# Patient Record
Sex: Female | Born: 1940 | Race: White | Hispanic: No | Smoking: Former smoker
Health system: Southern US, Community
[De-identification: ages and names within clinical notes are randomized; demographics above are authoritative.]

## PROBLEM LIST (undated history)

## (undated) DIAGNOSIS — K219 Gastro-esophageal reflux disease without esophagitis: Secondary | ICD-10-CM

## (undated) DIAGNOSIS — Z860100 Personal history of colon polyps, unspecified: Secondary | ICD-10-CM

## (undated) DIAGNOSIS — C50919 Malignant neoplasm of unspecified site of unspecified female breast: Secondary | ICD-10-CM

## (undated) DIAGNOSIS — Z8719 Personal history of other diseases of the digestive system: Secondary | ICD-10-CM

## (undated) DIAGNOSIS — IMO0002 Reserved for concepts with insufficient information to code with codable children: Secondary | ICD-10-CM

## (undated) DIAGNOSIS — Z8709 Personal history of other diseases of the respiratory system: Secondary | ICD-10-CM

## (undated) DIAGNOSIS — N993 Prolapse of vaginal vault after hysterectomy: Secondary | ICD-10-CM

## (undated) DIAGNOSIS — R350 Frequency of micturition: Secondary | ICD-10-CM

## (undated) DIAGNOSIS — I2489 Other forms of acute ischemic heart disease: Secondary | ICD-10-CM

## (undated) DIAGNOSIS — R102 Pelvic and perineal pain: Secondary | ICD-10-CM

## (undated) DIAGNOSIS — M1990 Unspecified osteoarthritis, unspecified site: Secondary | ICD-10-CM

## (undated) DIAGNOSIS — I10 Essential (primary) hypertension: Secondary | ICD-10-CM

## (undated) DIAGNOSIS — Z9109 Other allergy status, other than to drugs and biological substances: Secondary | ICD-10-CM

## (undated) DIAGNOSIS — R351 Nocturia: Secondary | ICD-10-CM

## (undated) DIAGNOSIS — R319 Hematuria, unspecified: Secondary | ICD-10-CM

## (undated) DIAGNOSIS — I248 Other forms of acute ischemic heart disease: Secondary | ICD-10-CM

## (undated) DIAGNOSIS — R238 Other skin changes: Secondary | ICD-10-CM

## (undated) DIAGNOSIS — G8929 Other chronic pain: Secondary | ICD-10-CM

## (undated) DIAGNOSIS — I499 Cardiac arrhythmia, unspecified: Secondary | ICD-10-CM

## (undated) DIAGNOSIS — Z4689 Encounter for fitting and adjustment of other specified devices: Principal | ICD-10-CM

## (undated) DIAGNOSIS — I2510 Atherosclerotic heart disease of native coronary artery without angina pectoris: Secondary | ICD-10-CM

## (undated) DIAGNOSIS — Z8669 Personal history of other diseases of the nervous system and sense organs: Secondary | ICD-10-CM

## (undated) DIAGNOSIS — K5730 Diverticulosis of large intestine without perforation or abscess without bleeding: Secondary | ICD-10-CM

## (undated) DIAGNOSIS — D649 Anemia, unspecified: Secondary | ICD-10-CM

## (undated) DIAGNOSIS — N159 Renal tubulo-interstitial disease, unspecified: Secondary | ICD-10-CM

## (undated) DIAGNOSIS — R3915 Urgency of urination: Secondary | ICD-10-CM

## (undated) DIAGNOSIS — K6289 Other specified diseases of anus and rectum: Secondary | ICD-10-CM

## (undated) DIAGNOSIS — G4700 Insomnia, unspecified: Secondary | ICD-10-CM

## (undated) DIAGNOSIS — R0602 Shortness of breath: Secondary | ICD-10-CM

## (undated) DIAGNOSIS — M549 Dorsalgia, unspecified: Secondary | ICD-10-CM

## (undated) DIAGNOSIS — J302 Other seasonal allergic rhinitis: Secondary | ICD-10-CM

## (undated) DIAGNOSIS — F410 Panic disorder [episodic paroxysmal anxiety] without agoraphobia: Secondary | ICD-10-CM

## (undated) HISTORY — DX: Encounter for fitting and adjustment of other specified devices: Z46.89

## (undated) HISTORY — DX: Other allergy status, other than to drugs and biological substances: Z91.09

## (undated) HISTORY — DX: Essential (primary) hypertension: I10

## (undated) HISTORY — DX: Hematuria, unspecified: R31.9

## (undated) HISTORY — PX: BACK SURGERY: SHX140

## (undated) HISTORY — DX: Gastro-esophageal reflux disease without esophagitis: K21.9

## (undated) HISTORY — PX: BLADDER SUSPENSION: SHX72

## (undated) HISTORY — DX: Other chronic pain: G89.29

## (undated) HISTORY — DX: Diverticulosis of large intestine without perforation or abscess without bleeding: K57.30

## (undated) HISTORY — PX: ABDOMINAL HYSTERECTOMY: SHX81

## (undated) HISTORY — PX: CHOLECYSTECTOMY: SHX55

## (undated) HISTORY — DX: Other forms of acute ischemic heart disease: I24.89

## (undated) HISTORY — PX: EYE SURGERY: SHX253

## (undated) HISTORY — DX: Atherosclerotic heart disease of native coronary artery without angina pectoris: I25.10

## (undated) HISTORY — PX: MOUTH SURGERY: SHX715

## (undated) HISTORY — DX: Other forms of acute ischemic heart disease: I24.8

## (undated) HISTORY — DX: Reserved for concepts with insufficient information to code with codable children: IMO0002

## (undated) HISTORY — DX: Personal history of other diseases of the digestive system: Z87.19

## (undated) HISTORY — PX: MASTECTOMY: SHX3

## (undated) HISTORY — DX: Malignant neoplasm of unspecified site of unspecified female breast: C50.919

## (undated) HISTORY — PX: BREAST SURGERY: SHX581

## (undated) HISTORY — DX: Pelvic and perineal pain: R10.2

## (undated) HISTORY — DX: Other specified diseases of anus and rectum: K62.89

## (undated) HISTORY — DX: Prolapse of vaginal vault after hysterectomy: N99.3

---

## 1999-05-28 ENCOUNTER — Ambulatory Visit (HOSPITAL_COMMUNITY): Admission: RE | Admit: 1999-05-28 | Discharge: 1999-05-28 | Payer: Self-pay | Admitting: Gastroenterology

## 1999-05-28 ENCOUNTER — Encounter (INDEPENDENT_AMBULATORY_CARE_PROVIDER_SITE_OTHER): Payer: Self-pay

## 2000-09-11 ENCOUNTER — Encounter: Payer: Self-pay | Admitting: Family Medicine

## 2000-09-11 ENCOUNTER — Ambulatory Visit (HOSPITAL_COMMUNITY): Admission: RE | Admit: 2000-09-11 | Discharge: 2000-09-11 | Payer: Self-pay | Admitting: Family Medicine

## 2000-09-30 ENCOUNTER — Encounter: Payer: Self-pay | Admitting: Cardiology

## 2000-09-30 ENCOUNTER — Ambulatory Visit (HOSPITAL_COMMUNITY): Admission: RE | Admit: 2000-09-30 | Discharge: 2000-09-30 | Payer: Self-pay | Admitting: Cardiology

## 2000-10-31 ENCOUNTER — Emergency Department (HOSPITAL_COMMUNITY): Admission: EM | Admit: 2000-10-31 | Discharge: 2000-10-31 | Payer: Self-pay | Admitting: *Deleted

## 2000-10-31 ENCOUNTER — Encounter: Payer: Self-pay | Admitting: *Deleted

## 2000-11-06 ENCOUNTER — Encounter: Payer: Self-pay | Admitting: Obstetrics and Gynecology

## 2000-11-06 ENCOUNTER — Ambulatory Visit (HOSPITAL_COMMUNITY): Admission: RE | Admit: 2000-11-06 | Discharge: 2000-11-06 | Payer: Self-pay | Admitting: Obstetrics and Gynecology

## 2000-11-19 ENCOUNTER — Ambulatory Visit (HOSPITAL_COMMUNITY): Admission: RE | Admit: 2000-11-19 | Discharge: 2000-11-19 | Payer: Self-pay | Admitting: Family Medicine

## 2000-11-19 ENCOUNTER — Encounter: Payer: Self-pay | Admitting: Family Medicine

## 2001-06-03 ENCOUNTER — Inpatient Hospital Stay (HOSPITAL_COMMUNITY): Admission: RE | Admit: 2001-06-03 | Discharge: 2001-06-05 | Payer: Self-pay | Admitting: Obstetrics and Gynecology

## 2001-06-08 ENCOUNTER — Emergency Department (HOSPITAL_COMMUNITY): Admission: EM | Admit: 2001-06-08 | Discharge: 2001-06-08 | Payer: Self-pay | Admitting: *Deleted

## 2002-04-28 HISTORY — PX: BREAST RECONSTRUCTION: SHX9

## 2002-06-21 ENCOUNTER — Encounter: Payer: Self-pay | Admitting: Obstetrics and Gynecology

## 2002-06-21 ENCOUNTER — Ambulatory Visit (HOSPITAL_COMMUNITY): Admission: RE | Admit: 2002-06-21 | Discharge: 2002-06-21 | Payer: Self-pay | Admitting: Obstetrics and Gynecology

## 2002-06-27 ENCOUNTER — Ambulatory Visit (HOSPITAL_COMMUNITY): Admission: RE | Admit: 2002-06-27 | Discharge: 2002-06-27 | Payer: Self-pay | Admitting: Obstetrics and Gynecology

## 2002-06-27 ENCOUNTER — Encounter: Payer: Self-pay | Admitting: Obstetrics and Gynecology

## 2002-07-06 ENCOUNTER — Encounter: Payer: Self-pay | Admitting: General Surgery

## 2002-07-06 ENCOUNTER — Ambulatory Visit (HOSPITAL_COMMUNITY): Admission: RE | Admit: 2002-07-06 | Discharge: 2002-07-06 | Payer: Self-pay | Admitting: General Surgery

## 2002-07-18 ENCOUNTER — Encounter (HOSPITAL_COMMUNITY): Admission: RE | Admit: 2002-07-18 | Discharge: 2002-08-17 | Payer: Self-pay | Admitting: Oncology

## 2002-07-18 ENCOUNTER — Encounter (HOSPITAL_COMMUNITY): Payer: Self-pay | Admitting: Oncology

## 2002-07-18 ENCOUNTER — Encounter: Admission: RE | Admit: 2002-07-18 | Discharge: 2002-07-18 | Payer: Self-pay | Admitting: Oncology

## 2002-07-19 ENCOUNTER — Inpatient Hospital Stay (HOSPITAL_COMMUNITY): Admission: RE | Admit: 2002-07-19 | Discharge: 2002-07-21 | Payer: Self-pay | Admitting: General Surgery

## 2002-07-25 ENCOUNTER — Encounter (HOSPITAL_COMMUNITY): Payer: Self-pay | Admitting: Oncology

## 2002-08-07 ENCOUNTER — Emergency Department (HOSPITAL_COMMUNITY): Admission: EM | Admit: 2002-08-07 | Discharge: 2002-08-07 | Payer: Self-pay | Admitting: Emergency Medicine

## 2002-08-07 ENCOUNTER — Encounter: Payer: Self-pay | Admitting: Emergency Medicine

## 2002-08-30 ENCOUNTER — Encounter (HOSPITAL_COMMUNITY): Payer: Self-pay | Admitting: Oncology

## 2002-08-30 ENCOUNTER — Encounter (HOSPITAL_COMMUNITY): Admission: RE | Admit: 2002-08-30 | Discharge: 2002-09-29 | Payer: Self-pay | Admitting: Oncology

## 2002-08-30 ENCOUNTER — Encounter: Admission: RE | Admit: 2002-08-30 | Discharge: 2002-08-30 | Payer: Self-pay | Admitting: Oncology

## 2002-10-17 ENCOUNTER — Encounter: Payer: Self-pay | Admitting: Family Medicine

## 2002-10-17 ENCOUNTER — Ambulatory Visit (HOSPITAL_COMMUNITY): Admission: RE | Admit: 2002-10-17 | Discharge: 2002-10-17 | Payer: Self-pay | Admitting: Family Medicine

## 2002-10-28 ENCOUNTER — Encounter (HOSPITAL_COMMUNITY): Admission: RE | Admit: 2002-10-28 | Discharge: 2002-11-27 | Payer: Self-pay | Admitting: Oncology

## 2002-10-28 ENCOUNTER — Encounter: Admission: RE | Admit: 2002-10-28 | Discharge: 2002-10-28 | Payer: Self-pay | Admitting: Oncology

## 2002-12-05 ENCOUNTER — Encounter: Admission: RE | Admit: 2002-12-05 | Discharge: 2002-12-05 | Payer: Self-pay | Admitting: Oncology

## 2002-12-05 ENCOUNTER — Encounter (HOSPITAL_COMMUNITY): Payer: Self-pay | Admitting: Oncology

## 2002-12-05 ENCOUNTER — Encounter (HOSPITAL_COMMUNITY): Admission: RE | Admit: 2002-12-05 | Discharge: 2003-01-04 | Payer: Self-pay | Admitting: Oncology

## 2003-01-04 ENCOUNTER — Inpatient Hospital Stay (HOSPITAL_COMMUNITY): Admission: RE | Admit: 2003-01-04 | Discharge: 2003-01-06 | Payer: Self-pay | Admitting: Plastic Surgery

## 2003-03-08 ENCOUNTER — Encounter: Admission: RE | Admit: 2003-03-08 | Discharge: 2003-03-08 | Payer: Self-pay | Admitting: Oncology

## 2003-03-08 ENCOUNTER — Encounter (HOSPITAL_COMMUNITY): Admission: RE | Admit: 2003-03-08 | Discharge: 2003-04-07 | Payer: Self-pay | Admitting: Oncology

## 2003-04-04 ENCOUNTER — Ambulatory Visit (HOSPITAL_BASED_OUTPATIENT_CLINIC_OR_DEPARTMENT_OTHER): Admission: RE | Admit: 2003-04-04 | Discharge: 2003-04-04 | Payer: Self-pay | Admitting: Plastic Surgery

## 2003-07-07 ENCOUNTER — Encounter (HOSPITAL_COMMUNITY): Admission: RE | Admit: 2003-07-07 | Discharge: 2003-08-06 | Payer: Self-pay | Admitting: Oncology

## 2003-07-07 ENCOUNTER — Encounter: Admission: RE | Admit: 2003-07-07 | Discharge: 2003-07-07 | Payer: Self-pay | Admitting: Oncology

## 2003-07-07 ENCOUNTER — Ambulatory Visit (HOSPITAL_COMMUNITY): Admission: RE | Admit: 2003-07-07 | Discharge: 2003-07-07 | Payer: Self-pay | Admitting: Family Medicine

## 2003-09-08 ENCOUNTER — Encounter: Admission: RE | Admit: 2003-09-08 | Discharge: 2003-09-08 | Payer: Self-pay | Admitting: Oncology

## 2003-09-08 ENCOUNTER — Encounter (HOSPITAL_COMMUNITY): Admission: RE | Admit: 2003-09-08 | Discharge: 2003-10-08 | Payer: Self-pay | Admitting: Oncology

## 2004-01-18 ENCOUNTER — Ambulatory Visit (HOSPITAL_COMMUNITY): Admission: RE | Admit: 2004-01-18 | Discharge: 2004-01-18 | Payer: Self-pay | Admitting: Internal Medicine

## 2004-01-18 HISTORY — PX: COLONOSCOPY: SHX174

## 2004-03-04 ENCOUNTER — Ambulatory Visit (HOSPITAL_COMMUNITY): Admission: RE | Admit: 2004-03-04 | Discharge: 2004-03-04 | Payer: Self-pay | Admitting: Plastic Surgery

## 2004-03-11 ENCOUNTER — Encounter (HOSPITAL_COMMUNITY): Admission: RE | Admit: 2004-03-11 | Discharge: 2004-04-10 | Payer: Self-pay | Admitting: Oncology

## 2004-03-11 ENCOUNTER — Ambulatory Visit (HOSPITAL_COMMUNITY): Payer: Self-pay | Admitting: Oncology

## 2004-03-11 ENCOUNTER — Encounter: Admission: RE | Admit: 2004-03-11 | Discharge: 2004-03-11 | Payer: Self-pay | Admitting: Oncology

## 2004-04-09 ENCOUNTER — Ambulatory Visit: Payer: Self-pay | Admitting: Pulmonary Disease

## 2004-06-07 ENCOUNTER — Ambulatory Visit: Payer: Self-pay | Admitting: *Deleted

## 2004-06-14 ENCOUNTER — Ambulatory Visit (HOSPITAL_COMMUNITY): Admission: RE | Admit: 2004-06-14 | Discharge: 2004-06-14 | Payer: Self-pay | Admitting: *Deleted

## 2004-06-14 ENCOUNTER — Ambulatory Visit: Payer: Self-pay | Admitting: *Deleted

## 2004-06-21 ENCOUNTER — Ambulatory Visit: Payer: Self-pay | Admitting: *Deleted

## 2004-06-28 ENCOUNTER — Ambulatory Visit: Payer: Self-pay | Admitting: Pulmonary Disease

## 2004-08-12 ENCOUNTER — Ambulatory Visit (HOSPITAL_COMMUNITY): Admission: RE | Admit: 2004-08-12 | Discharge: 2004-08-12 | Payer: Self-pay | Admitting: Obstetrics and Gynecology

## 2004-09-09 ENCOUNTER — Encounter (HOSPITAL_COMMUNITY): Admission: RE | Admit: 2004-09-09 | Discharge: 2004-10-09 | Payer: Self-pay | Admitting: Oncology

## 2004-09-09 ENCOUNTER — Encounter: Admission: RE | Admit: 2004-09-09 | Discharge: 2004-09-09 | Payer: Self-pay | Admitting: Oncology

## 2004-09-09 ENCOUNTER — Ambulatory Visit (HOSPITAL_COMMUNITY): Payer: Self-pay | Admitting: Oncology

## 2005-03-17 ENCOUNTER — Encounter: Admission: RE | Admit: 2005-03-17 | Discharge: 2005-03-17 | Payer: Self-pay | Admitting: Oncology

## 2005-03-17 ENCOUNTER — Encounter (HOSPITAL_COMMUNITY): Admission: RE | Admit: 2005-03-17 | Discharge: 2005-04-16 | Payer: Self-pay | Admitting: Oncology

## 2005-03-17 ENCOUNTER — Ambulatory Visit (HOSPITAL_COMMUNITY): Payer: Self-pay | Admitting: Oncology

## 2005-03-31 ENCOUNTER — Ambulatory Visit: Payer: Self-pay | Admitting: Internal Medicine

## 2005-04-29 ENCOUNTER — Ambulatory Visit: Payer: Self-pay | Admitting: Internal Medicine

## 2005-04-30 ENCOUNTER — Ambulatory Visit (HOSPITAL_COMMUNITY): Admission: RE | Admit: 2005-04-30 | Discharge: 2005-04-30 | Payer: Self-pay | Admitting: Internal Medicine

## 2005-05-12 ENCOUNTER — Ambulatory Visit: Payer: Self-pay | Admitting: Internal Medicine

## 2005-05-19 ENCOUNTER — Ambulatory Visit (HOSPITAL_COMMUNITY): Admission: RE | Admit: 2005-05-19 | Discharge: 2005-05-19 | Payer: Self-pay | Admitting: Internal Medicine

## 2005-06-23 ENCOUNTER — Ambulatory Visit: Payer: Self-pay | Admitting: Internal Medicine

## 2005-08-26 ENCOUNTER — Ambulatory Visit (HOSPITAL_COMMUNITY): Admission: RE | Admit: 2005-08-26 | Discharge: 2005-08-26 | Payer: Self-pay | Admitting: Obstetrics and Gynecology

## 2005-09-15 ENCOUNTER — Encounter: Admission: RE | Admit: 2005-09-15 | Discharge: 2005-09-15 | Payer: Self-pay | Admitting: Oncology

## 2005-09-15 ENCOUNTER — Ambulatory Visit (HOSPITAL_COMMUNITY): Payer: Self-pay | Admitting: Oncology

## 2005-09-15 ENCOUNTER — Ambulatory Visit (HOSPITAL_COMMUNITY): Admission: RE | Admit: 2005-09-15 | Discharge: 2005-09-15 | Payer: Self-pay | Admitting: Family Medicine

## 2005-09-15 ENCOUNTER — Encounter (HOSPITAL_COMMUNITY): Admission: RE | Admit: 2005-09-15 | Discharge: 2005-10-15 | Payer: Self-pay | Admitting: Oncology

## 2005-12-01 ENCOUNTER — Ambulatory Visit: Payer: Self-pay | Admitting: Internal Medicine

## 2005-12-04 ENCOUNTER — Ambulatory Visit (HOSPITAL_COMMUNITY): Admission: RE | Admit: 2005-12-04 | Discharge: 2005-12-04 | Payer: Self-pay | Admitting: Internal Medicine

## 2006-05-05 ENCOUNTER — Ambulatory Visit (HOSPITAL_COMMUNITY): Payer: Self-pay | Admitting: Oncology

## 2006-05-05 ENCOUNTER — Encounter (HOSPITAL_COMMUNITY): Admission: RE | Admit: 2006-05-05 | Discharge: 2006-06-04 | Payer: Self-pay | Admitting: Oncology

## 2006-05-20 ENCOUNTER — Ambulatory Visit: Payer: Self-pay | Admitting: Cardiology

## 2006-05-22 ENCOUNTER — Ambulatory Visit: Payer: Self-pay | Admitting: Cardiology

## 2006-05-26 ENCOUNTER — Inpatient Hospital Stay (HOSPITAL_BASED_OUTPATIENT_CLINIC_OR_DEPARTMENT_OTHER): Admission: RE | Admit: 2006-05-26 | Discharge: 2006-05-26 | Payer: Self-pay | Admitting: Cardiology

## 2006-05-26 ENCOUNTER — Ambulatory Visit: Payer: Self-pay | Admitting: Cardiovascular Disease

## 2006-06-11 ENCOUNTER — Ambulatory Visit: Payer: Self-pay | Admitting: Cardiology

## 2006-06-23 ENCOUNTER — Ambulatory Visit: Payer: Self-pay | Admitting: Cardiology

## 2006-07-21 ENCOUNTER — Ambulatory Visit: Payer: Self-pay | Admitting: Cardiology

## 2006-08-03 ENCOUNTER — Ambulatory Visit: Payer: Self-pay | Admitting: Internal Medicine

## 2006-08-03 ENCOUNTER — Ambulatory Visit (HOSPITAL_COMMUNITY): Admission: RE | Admit: 2006-08-03 | Discharge: 2006-08-03 | Payer: Self-pay | Admitting: Cardiology

## 2006-08-12 ENCOUNTER — Ambulatory Visit: Payer: Self-pay | Admitting: Physician Assistant

## 2006-08-26 ENCOUNTER — Ambulatory Visit: Payer: Self-pay | Admitting: Cardiology

## 2006-09-07 ENCOUNTER — Ambulatory Visit (HOSPITAL_COMMUNITY): Admission: RE | Admit: 2006-09-07 | Discharge: 2006-09-07 | Payer: Self-pay | Admitting: Obstetrics and Gynecology

## 2006-11-09 ENCOUNTER — Encounter (HOSPITAL_COMMUNITY): Admission: RE | Admit: 2006-11-09 | Discharge: 2006-12-09 | Payer: Self-pay | Admitting: Oncology

## 2006-11-09 ENCOUNTER — Ambulatory Visit (HOSPITAL_COMMUNITY): Payer: Self-pay | Admitting: Oncology

## 2007-05-19 ENCOUNTER — Ambulatory Visit (HOSPITAL_COMMUNITY): Payer: Self-pay | Admitting: Oncology

## 2007-05-19 ENCOUNTER — Encounter (HOSPITAL_COMMUNITY): Admission: RE | Admit: 2007-05-19 | Discharge: 2007-06-18 | Payer: Self-pay | Admitting: Oncology

## 2007-06-01 ENCOUNTER — Ambulatory Visit: Payer: Self-pay | Admitting: Internal Medicine

## 2007-06-14 ENCOUNTER — Encounter: Payer: Self-pay | Admitting: Internal Medicine

## 2007-06-14 ENCOUNTER — Ambulatory Visit (HOSPITAL_COMMUNITY): Admission: RE | Admit: 2007-06-14 | Discharge: 2007-06-14 | Payer: Self-pay | Admitting: Internal Medicine

## 2007-06-14 ENCOUNTER — Ambulatory Visit: Payer: Self-pay | Admitting: Internal Medicine

## 2007-06-14 HISTORY — PX: COLONOSCOPY: SHX174

## 2007-06-14 HISTORY — PX: ESOPHAGOGASTRODUODENOSCOPY: SHX1529

## 2007-07-14 ENCOUNTER — Encounter (HOSPITAL_COMMUNITY): Admission: RE | Admit: 2007-07-14 | Discharge: 2007-08-13 | Payer: Self-pay | Admitting: Oncology

## 2007-07-14 ENCOUNTER — Ambulatory Visit (HOSPITAL_COMMUNITY): Payer: Self-pay | Admitting: Oncology

## 2007-08-01 ENCOUNTER — Encounter: Admission: RE | Admit: 2007-08-01 | Discharge: 2007-08-01 | Payer: Self-pay | Admitting: Specialist

## 2007-09-15 ENCOUNTER — Ambulatory Visit (HOSPITAL_COMMUNITY): Admission: RE | Admit: 2007-09-15 | Discharge: 2007-09-15 | Payer: Self-pay | Admitting: Obstetrics and Gynecology

## 2008-05-26 ENCOUNTER — Ambulatory Visit (HOSPITAL_COMMUNITY): Admission: RE | Admit: 2008-05-26 | Discharge: 2008-05-26 | Payer: Self-pay | Admitting: Family Medicine

## 2008-07-03 ENCOUNTER — Ambulatory Visit (HOSPITAL_COMMUNITY): Payer: Self-pay | Admitting: Oncology

## 2008-09-09 ENCOUNTER — Emergency Department (HOSPITAL_COMMUNITY): Admission: EM | Admit: 2008-09-09 | Discharge: 2008-09-09 | Payer: Self-pay | Admitting: Emergency Medicine

## 2008-09-15 ENCOUNTER — Encounter (HOSPITAL_COMMUNITY): Admission: RE | Admit: 2008-09-15 | Discharge: 2008-10-15 | Payer: Self-pay | Admitting: Oncology

## 2008-12-09 ENCOUNTER — Encounter: Admission: RE | Admit: 2008-12-09 | Discharge: 2008-12-09 | Payer: Self-pay | Admitting: Specialist

## 2009-06-28 ENCOUNTER — Inpatient Hospital Stay (HOSPITAL_COMMUNITY): Admission: RE | Admit: 2009-06-28 | Discharge: 2009-06-30 | Payer: Self-pay | Admitting: Specialist

## 2009-10-01 ENCOUNTER — Ambulatory Visit (HOSPITAL_COMMUNITY): Admission: RE | Admit: 2009-10-01 | Discharge: 2009-10-01 | Payer: Self-pay | Admitting: Oncology

## 2009-10-23 ENCOUNTER — Ambulatory Visit (HOSPITAL_COMMUNITY): Payer: Self-pay | Admitting: Oncology

## 2009-12-14 DIAGNOSIS — J45909 Unspecified asthma, uncomplicated: Secondary | ICD-10-CM | POA: Insufficient documentation

## 2009-12-14 DIAGNOSIS — I2510 Atherosclerotic heart disease of native coronary artery without angina pectoris: Secondary | ICD-10-CM | POA: Insufficient documentation

## 2009-12-14 DIAGNOSIS — K219 Gastro-esophageal reflux disease without esophagitis: Secondary | ICD-10-CM | POA: Insufficient documentation

## 2009-12-14 DIAGNOSIS — Z853 Personal history of malignant neoplasm of breast: Secondary | ICD-10-CM | POA: Insufficient documentation

## 2009-12-18 ENCOUNTER — Encounter: Payer: Self-pay | Admitting: Gastroenterology

## 2009-12-18 ENCOUNTER — Ambulatory Visit: Payer: Self-pay | Admitting: Internal Medicine

## 2009-12-18 DIAGNOSIS — R109 Unspecified abdominal pain: Secondary | ICD-10-CM | POA: Insufficient documentation

## 2009-12-18 DIAGNOSIS — R198 Other specified symptoms and signs involving the digestive system and abdomen: Secondary | ICD-10-CM | POA: Insufficient documentation

## 2009-12-20 ENCOUNTER — Encounter: Payer: Self-pay | Admitting: Gastroenterology

## 2009-12-21 ENCOUNTER — Encounter: Payer: Self-pay | Admitting: Gastroenterology

## 2009-12-21 DIAGNOSIS — D649 Anemia, unspecified: Secondary | ICD-10-CM | POA: Insufficient documentation

## 2009-12-21 LAB — CONVERTED CEMR LAB
ALT: 23 units/L (ref 0–35)
AST: 32 units/L (ref 0–37)
Albumin: 4.3 g/dL (ref 3.5–5.2)
Alkaline Phosphatase: 70 units/L (ref 39–117)
BUN: 20 mg/dL (ref 6–23)
Basophils Absolute: 0 10*3/uL (ref 0.0–0.1)
Basophils Relative: 0 % (ref 0–1)
CO2: 27 meq/L (ref 19–32)
Calcium: 9.9 mg/dL (ref 8.4–10.5)
Chloride: 103 meq/L (ref 96–112)
Creatinine, Ser: 1 mg/dL (ref 0.40–1.20)
Eosinophils Absolute: 0.8 10*3/uL — ABNORMAL HIGH (ref 0.0–0.7)
Eosinophils Relative: 8 % — ABNORMAL HIGH (ref 0–5)
Free T4: 1.14 ng/dL (ref 0.80–1.80)
Glucose, Bld: 96 mg/dL (ref 70–99)
HCT: 36.4 % (ref 36.0–46.0)
Hemoglobin: 11.8 g/dL — ABNORMAL LOW (ref 12.0–15.0)
Lymphocytes Relative: 49 % — ABNORMAL HIGH (ref 12–46)
Lymphs Abs: 5 10*3/uL — ABNORMAL HIGH (ref 0.7–4.0)
MCHC: 32.4 g/dL (ref 30.0–36.0)
MCV: 94.8 fL (ref 78.0–100.0)
Monocytes Absolute: 0.6 10*3/uL (ref 0.1–1.0)
Monocytes Relative: 6 % (ref 3–12)
Neutro Abs: 3.8 10*3/uL (ref 1.7–7.7)
Neutrophils Relative %: 37 % — ABNORMAL LOW (ref 43–77)
Platelets: 307 10*3/uL (ref 150–400)
Potassium: 4.6 meq/L (ref 3.5–5.3)
RBC: 3.84 M/uL — ABNORMAL LOW (ref 3.87–5.11)
RDW: 13.7 % (ref 11.5–15.5)
Sodium: 139 meq/L (ref 135–145)
TSH: 1.934 microintl units/mL (ref 0.350–4.500)
Total Bilirubin: 0.5 mg/dL (ref 0.3–1.2)
Total Protein: 7.3 g/dL (ref 6.0–8.3)
WBC: 10.3 10*3/uL (ref 4.0–10.5)

## 2009-12-25 ENCOUNTER — Encounter: Payer: Self-pay | Admitting: Internal Medicine

## 2009-12-25 ENCOUNTER — Ambulatory Visit (HOSPITAL_COMMUNITY): Admission: RE | Admit: 2009-12-25 | Discharge: 2009-12-25 | Payer: Self-pay | Admitting: Internal Medicine

## 2009-12-26 ENCOUNTER — Telehealth (INDEPENDENT_AMBULATORY_CARE_PROVIDER_SITE_OTHER): Payer: Self-pay | Admitting: *Deleted

## 2009-12-27 ENCOUNTER — Encounter (INDEPENDENT_AMBULATORY_CARE_PROVIDER_SITE_OTHER): Payer: Self-pay

## 2009-12-27 HISTORY — PX: COLONOSCOPY: SHX174

## 2010-01-03 ENCOUNTER — Telehealth (INDEPENDENT_AMBULATORY_CARE_PROVIDER_SITE_OTHER): Payer: Self-pay

## 2010-01-03 ENCOUNTER — Ambulatory Visit (HOSPITAL_COMMUNITY): Admission: RE | Admit: 2010-01-03 | Discharge: 2010-01-03 | Payer: Self-pay | Admitting: Internal Medicine

## 2010-01-03 ENCOUNTER — Ambulatory Visit: Payer: Self-pay | Admitting: Internal Medicine

## 2010-01-22 ENCOUNTER — Encounter: Payer: Self-pay | Admitting: Gastroenterology

## 2010-01-22 LAB — CONVERTED CEMR LAB
Basophils Absolute: 0.1 10*3/uL (ref 0.0–0.1)
Basophils Relative: 1 % (ref 0–1)
Eosinophils Absolute: 0.7 10*3/uL (ref 0.0–0.7)
Eosinophils Relative: 7 % — ABNORMAL HIGH (ref 0–5)
HCT: 36.2 % (ref 36.0–46.0)
Hemoglobin: 11.5 g/dL — ABNORMAL LOW (ref 12.0–15.0)
Lymphocytes Relative: 55 % — ABNORMAL HIGH (ref 12–46)
Lymphs Abs: 5.4 10*3/uL — ABNORMAL HIGH (ref 0.7–4.0)
MCHC: 31.8 g/dL (ref 30.0–36.0)
MCV: 95.8 fL (ref 78.0–100.0)
Monocytes Absolute: 0.9 10*3/uL (ref 0.1–1.0)
Monocytes Relative: 9 % (ref 3–12)
Neutro Abs: 2.9 10*3/uL (ref 1.7–7.7)
Neutrophils Relative %: 30 % — ABNORMAL LOW (ref 43–77)
Platelets: 317 10*3/uL (ref 150–400)
RBC: 3.78 M/uL — ABNORMAL LOW (ref 3.87–5.11)
RDW: 13.4 % (ref 11.5–15.5)
WBC: 9.9 10*3/uL (ref 4.0–10.5)

## 2010-02-14 ENCOUNTER — Ambulatory Visit: Payer: Self-pay | Admitting: Gastroenterology

## 2010-03-15 ENCOUNTER — Encounter (INDEPENDENT_AMBULATORY_CARE_PROVIDER_SITE_OTHER): Payer: Self-pay

## 2010-04-09 LAB — CONVERTED CEMR LAB
Basophils Absolute: 0 10*3/uL (ref 0.0–0.1)
Basophils Relative: 1 % (ref 0–1)
Eosinophils Absolute: 0.3 10*3/uL (ref 0.0–0.7)
Eosinophils Relative: 3 % (ref 0–5)
HCT: 36 % (ref 36.0–46.0)
Hemoglobin: 11.9 g/dL — ABNORMAL LOW (ref 12.0–15.0)
Lymphocytes Relative: 55 % — ABNORMAL HIGH (ref 12–46)
Lymphs Abs: 4.4 10*3/uL — ABNORMAL HIGH (ref 0.7–4.0)
MCHC: 33.1 g/dL (ref 30.0–36.0)
MCV: 93.8 fL (ref 78.0–100.0)
Monocytes Absolute: 0.5 10*3/uL (ref 0.1–1.0)
Monocytes Relative: 6 % (ref 3–12)
Neutro Abs: 2.9 10*3/uL (ref 1.7–7.7)
Neutrophils Relative %: 36 % — ABNORMAL LOW (ref 43–77)
Platelets: 295 10*3/uL (ref 150–400)
RBC: 3.84 M/uL — ABNORMAL LOW (ref 3.87–5.11)
RDW: 13.2 % (ref 11.5–15.5)
WBC: 8.1 10*3/uL (ref 4.0–10.5)

## 2010-05-19 ENCOUNTER — Encounter: Payer: Self-pay | Admitting: Internal Medicine

## 2010-05-28 NOTE — Progress Notes (Signed)
Summary: phone note/probiotics samples  Phone Note Other Incoming   Caller: Dr. Jena Gauss Summary of Call: Dr. Jena Gauss called from hospital and said to leave [facility]/Probiotic for pt to have # 14, one daily. Placed at  front for pick up.  Initial call taken by: Cloria Spring LPN,  January 03, 2010 2:06 PM

## 2010-05-28 NOTE — Letter (Signed)
Summary: TCS ORDER  TCS ORDER   Imported By: Ave Filter 12/25/2009 16:24:36  _____________________________________________________________________  External Attachment:    Type:   Image     Comment:   External Document

## 2010-05-28 NOTE — Letter (Signed)
Summary: CT order   CT order   Imported By: Peggyann Shoals 12/20/2009 11:26:56  _____________________________________________________________________  External Attachment:    Type:   Image     Comment:   External Document

## 2010-05-28 NOTE — Letter (Signed)
Summary: Recall, Labs Needed  [facility]  [address]   [place], [place] [address]   Phone: [phone]  Fax: [phone]    March 15, 2010  [name] [address] [place], [place]  [address] April 22, 1941   Dear Ms. NEUMEIER,   Our records indicate it is time to repeat your blood work.  You can take the enclosed form to the lab on or near the date indicated.  Please make note of the new location of the lab:   [address], 2nd floor   [facility]  Our office will call you within a week to ten business days with the results.  If you do not hear from [place] in 10 business days, you should call the office.  If you have any questions regarding this, call the office at [phone], and ask for the nurse.  Labs are due on 04/23/2010.   Sincerely,    Hendricks Limes LPN  [facility] Ph: [phone]   Fax: [phone]

## 2010-05-28 NOTE — Miscellaneous (Signed)
Summary: Orders Update  Clinical Lists Changes  Problems: Added new problem of UNSPECIFIED ANEMIA (ICD-285.9) Orders: Added new Test order of T-CBC w/Diff [id number]) - Signed

## 2010-05-28 NOTE — Letter (Signed)
Summary: Recall, Labs Needed  [facility]  [address]   [place], [place] [address]   Phone: [phone]  Fax: [phone]    December 27, 2009  MARIANGEL RINGLEY [address] [place], [place]  [address] March 30, 1941   Dear Ms. BOAKYE,   Our records indicate it is time to repeat your blood work.  You can take the enclosed form to the lab on or near the date indicated.  Please make note of the new location of the lab:   [address], 2nd floor   [facility]  Our office will call you within a week to ten business days with the results.  If you do not hear from [place] in 10 business days, you should call the office.  If you have any questions regarding this, call the office at [phone], and ask for the nurse.  Labs are due on 01/18/2010.   Sincerely,    Hendricks Limes LPN  [facility] Ph: [phone]   Fax: [phone]

## 2010-05-28 NOTE — Progress Notes (Signed)
  Phone Note Call from Patient   Reason for Call: Lab or Test Results Summary of Call: pt would like to know her CT results- She can be reached at [phone] Initial call taken by: Peggyann Shoals,  December 26, 2009 10:28 AM     Appended Document:  pt aware of CT results

## 2010-05-28 NOTE — Miscellaneous (Signed)
Summary: Orders Update  Clinical Lists Changes  Orders: Added new Test order of T-CBC w/Diff ([id number]) - Signed 

## 2010-05-28 NOTE — Assessment & Plan Note (Signed)
Summary: ABD PAIN/ABNORMAL BOWEL MOVEMENTS/LAW   Visit Type:  f/u Primary Care Provider:  Butch Penny  Chief Complaint:  abd pain and change in bowel habits.  History of Present Illness: Ms. [name] is here for further evaluation of change in bowels and abd pain.  She was last seen at time of EGD/TCS in 2/09. She had ribbon-like plaques in esophagus but KOH and bx negative. She had left-sided diverticula, anal canal hemorrhoids, rectal polyp (hyperplastic)  She c/o two months of pelvic pain into back and epigastric pain. Pain worse when bowels messed up, lots of gas. Last week, could not have BM. Took stool softner, MOM. Finally after taking double dose of MOM, stools liquid and still had pelvic/back throbbing. No abx recently. Having more problems with constipation. Walking daily. Eating two fiber bars daily, doing for long time. No caffeine consumption. Drinks lot of water. Some mornings, feels nauseated. No heartburn. No dysphagia. Weight down 5 pounds since spring. Brother died in 30-Aug-2022, colon cancer. She is concerned she may have cancer.     Current Medications (verified): 1)  Prilosec 20 Mg Cpdr (Omeprazole) 2)  Vitamin Supplement Daily 3)  Calcium Daily 4)  Bp Med .... Once Daily  Allergies (verified): 1)  ! Penicillin 2)  ! Asa  Past History:  Past Medical History: Breast cancer Asthma GERD Hypertension  Past Surgical History: Mastectomy, right Reconstructive breast surgery with abd flap Back Surgery, 1981 Cholecystectomy Hysterectomy Bladder tack/rectocele repair Back surgery, 3/11  Family History: Two sisters with lung cancer. Brother with sinus cancer, recently succumbed to [facility] (age 68). No FH of chronic GI, liver.    Social History: Married. 4 boys. Retired. No tob, alcohol, drug use.   Review of Systems General:  Denies fever, chills, sweats, anorexia, fatigue, weakness, and weight loss. Eyes:  Denies vision loss. ENT:  Denies nasal congestion, sore  throat, hoarseness, and difficulty swallowing. CV:  Denies chest pains, angina, palpitations, dyspnea on exertion, and peripheral edema. Resp:  Denies dyspnea at rest, dyspnea with exercise, cough, sputum, and wheezing. GI:  See HPI. GU:  Denies urinary burning and blood in urine. MS:  Complains of low back pain. Derm:  Denies rash and itching. Neuro:  Denies weakness, frequent headaches, memory loss, and confusion. Psych:  Complains of anxiety; denies depression, memory loss, and suicidal ideation. Endo:  Denies unusual weight change. Heme:  Denies bruising and bleeding. Allergy:  Denies hives and rash.  Vital Signs:  Patient profile:   70 year old female Height:      65 inches Weight:      169 pounds BMI:     28.22 Temp:     97.6 degrees F oral Pulse rate:   60 / minute BP sitting:   128 / 80  (left arm) Cuff size:   regular  Vitals Entered By: Hendricks Limes LPN (December 18, 2009 9:48 AM)  Physical Exam  General:  Well developed, well nourished, no acute distress. Head:  Normocephalic and atraumatic. Eyes:  Conjunctivae pink, no scleral icterus.  Mouth:  Oropharyngeal mucosa moist, pink.  No lesions, erythema or exudate.    Neck:  Supple; no masses or thyromegaly. Lungs:  Clear throughout to auscultation. Heart:  Regular rate and rhythm; no murmurs, rubs,  or bruits. Abdomen:  Soft. ND. Positive BS. Moderate tenderness in mid-abdomen. No rebound or guarding. No HSM or masses. No abd bruit or hernia.  Extremities:  No clubbing, cyanosis, edema or deformities noted. Neurologic:  Alert and  oriented  x4;  grossly normal neurologically. Skin:  Intact without significant lesions or rashes. Cervical Nodes:  No significant cervical adenopathy. Psych:  Alert and cooperative. Normal mood and affect.  Impression & Recommendations:  Problem # 1:  ABDOMINAL PAIN, UNSPECIFIED SITE (ICD-789.00) Abd tendernss and change in bowels. Quite significant tenderness on exam. Recommend CT A/P  with iv/oral contrast. Etiology not well-defined. Patient is very concerned about having colon cancer and requesting TCS. At this point, we need to consider other causes, given her recent TCS two years ago. She does have tendency towards constipation, so will add Dulcolax Balance one capful daily, #14 days samples provided.  Orders: T-Comprehensive Metabolic Panel [phone]) T-CBC w/Diff ([id number]) T-TSH [id number]) T-Free T4 ([address]300) Est. Patient Level IV ([address]606)  Problem # 2:  NEOPLASM, MALIGNANT, COLON, FAMILY HX, SIBLING (ICD-V16.0)  see #1.  Orders: Est. Patient Level IV (30160)   CC: Dr. Glenford Peers

## 2010-05-28 NOTE — Assessment & Plan Note (Signed)
Summary: S/P COLONOSCOPY/LAW   Visit Type:  Follow-up Visit Primary Care Provider:  [facility]  Chief Complaint:  F/U colonoscopy.  History of Present Illness: Ms. [name] is here for f/u. She feels much better. BM once day. Usually in AM. No melena, brbpr. Some abd pain but better, mostly suprapubic. Has recently been treated for UTI, just completed antibiotics and had repeat urine culture today. No epigastric pain now. No n/v. Still with lots of flatulence. No heartburn. No dysphagia. Takes prilosec only once every couple of weeks due to dietary changes. No longer needed MOM, Dulcolax Balance. Eating two fiber bars daily and lots of salad. Having lot of lower back problems and has f/u with neurosurgeon.  Recent TCS 9/11, see PMH.        Current Medications (verified): 1)  Prilosec 20 Mg Cpdr (Omeprazole) .... As Needed 2)  Vitamin Supplement Daily 3)  Calcium Daily 4)  Bp Med .... Once Daily  Allergies (verified): 1)  ! Penicillin 2)  ! Asa  Past History:  Past Medical History: Breast cancer Asthma GERD Hypertension TCS 9/11--> Few pancolonic diverticula.  Remaining colonic mucosa and terminal ileal mucosa appeared normal. Next TCS 12/2014  Review of Systems      See HPI GU:  Denies urinary burning and blood in urine.  Vital Signs:  Patient profile:   70 year old female Height:      65 inches Weight:      171 pounds BMI:     28.56 Temp:     97.6 degrees F oral Pulse rate:   72 / minute BP sitting:   140 / 72  (left arm) Cuff size:   large  Vitals Entered By: Cloria Spring LPN (February 14, 2010 10:34 AM)  Physical Exam  General:  Well developed, well nourished, no acute distress. Head:  Normocephalic and atraumatic. Eyes:  sclera nonicteric Mouth:  op moist Abdomen:  Soft. Positive BS. Mild suprapubic tenderness. No rebound or guarding. No HSM or masses. Rectal:  ifobt brought from home was negative Extremities:  No clubbing, cyanosis, edema or deformities  noted. Neurologic:  Alert and  oriented x4;  grossly normal neurologically. Psych:  Alert and cooperative. Normal mood and affect.  Impression & Recommendations:  Problem # 1:  CHANGE IN BOWELS (ICD-787.99)  Constipation improved. Managed with dietary changes at this time. TCS and CT reassuring. C/O flatulence which I suspect is from too much fiber in her diet. I've asked she drop fiber bars to once daily to see if this helps. Also gave her gas/bloat sheet. She will be due for CBC in 12/11 to f/u anemia, although it is much improved. Otherwise, OV as needed.  Orders: Est. Patient Level II ([address])

## 2010-06-14 ENCOUNTER — Other Ambulatory Visit: Payer: Self-pay | Admitting: Urology

## 2010-06-14 ENCOUNTER — Ambulatory Visit (HOSPITAL_BASED_OUTPATIENT_CLINIC_OR_DEPARTMENT_OTHER)
Admission: RE | Admit: 2010-06-14 | Discharge: 2010-06-14 | Disposition: A | Discharge status: Home / Self Care | Payer: MEDICARE | Source: Ambulatory Visit | Attending: Urology | Admitting: Urology

## 2010-06-14 DIAGNOSIS — Z01812 Encounter for preprocedural laboratory examination: Secondary | ICD-10-CM | POA: Insufficient documentation

## 2010-06-14 DIAGNOSIS — N3010 Interstitial cystitis (chronic) without hematuria: Secondary | ICD-10-CM | POA: Insufficient documentation

## 2010-06-17 LAB — POCT HEMOGLOBIN-HEMACUE: Hemoglobin: 12 g/dL (ref 12.0–15.0)

## 2010-06-21 NOTE — Op Note (Signed)
  [name], [name]                ACCOUNT NO.:  [id number]  MEDICAL RECORD NO.:  [id number]           PATIENT TYPE:  LOCATION:                                 FACILITY:  PHYSICIAN:  Maretta Bees. Vonita Moss, M.D.DATE OF BIRTH:  [birth year]  DATE OF PROCEDURE:  06/14/2010 DATE OF DISCHARGE:                              OPERATIVE REPORT   PREOPERATIVE DIAGNOSIS:  Rule out interstitial cystitis.  POSTOPERATIVE DIAGNOSIS:  Interstitial cystitis.  PROCEDURE:  Cystoscopy, hydraulic overdistention of bladder and cold cup bladder biopsy.  SURGEON:  Maretta Bees. Vonita Moss, MD  ANESTHESIA:  General.  INDICATIONS:  This lady has had long history of pelvic pain, pressure, and frequent urination.  She had a pelvic pain and symptom score of 22. She was suspected of having interstitial cystitis.  She was brought to the OR today for further evaluation and told about postop pain or bleeding or small risk of bladder rupture.  PROCEDURE IN DETAIL:  The patient was brought to the operating room and placed in lithotomy position.  External genitalia were prepped and draped in usual fashion.  She was cystoscoped and the bladder was unremarkable.  She held over 800 cc of irrigating fluid.  The bladder was empty and looking back in she had scattered submucosal petechiae and minimal hemorrhage.  Cold cup bladder biopsies were taken in representative hemorrhagic areas.  The biopsy sites were fulgurated. The bladder was emptied and the scope removed.  The patient sent to recovery room in good condition.  She was given a B and O suppository.     Maretta Bees. Vonita Moss, M.D.     LJP/MEDQ  D:  06/14/2010  T:  06/14/2010  Job:  [id number]  Electronically Signed by Larey Dresser M.D. on 06/21/2010 10:47:51 AM

## 2010-07-22 LAB — URINALYSIS, ROUTINE W REFLEX MICROSCOPIC
Bilirubin Urine: NEGATIVE
Bilirubin Urine: NEGATIVE
Glucose, UA: NEGATIVE mg/dL
Glucose, UA: NEGATIVE mg/dL
Hgb urine dipstick: NEGATIVE
Hgb urine dipstick: NEGATIVE
Ketones, ur: NEGATIVE mg/dL
Ketones, ur: NEGATIVE mg/dL
Nitrite: NEGATIVE
Nitrite: NEGATIVE
Protein, ur: NEGATIVE mg/dL
Protein, ur: NEGATIVE mg/dL
Specific Gravity, Urine: 1.007 (ref 1.005–1.030)
Specific Gravity, Urine: 1.013 (ref 1.005–1.030)
Urobilinogen, UA: 0.2 mg/dL (ref 0.0–1.0)
Urobilinogen, UA: 0.2 mg/dL (ref 0.0–1.0)
pH: 7 (ref 5.0–8.0)
pH: 7 (ref 5.0–8.0)

## 2010-07-22 LAB — TYPE AND SCREEN
ABO/RH(D): A NEG
Antibody Screen: NEGATIVE

## 2010-07-22 LAB — CBC
HCT: 30.5 % — ABNORMAL LOW (ref 36.0–46.0)
Hemoglobin: 10.1 g/dL — ABNORMAL LOW (ref 12.0–15.0)
MCHC: 33.2 g/dL (ref 30.0–36.0)
MCV: 93.6 fL (ref 78.0–100.0)
Platelets: 281 10*3/uL (ref 150–400)
RBC: 3.25 MIL/uL — ABNORMAL LOW (ref 3.87–5.11)
RDW: 12.8 % (ref 11.5–15.5)
WBC: 9.8 10*3/uL (ref 4.0–10.5)

## 2010-07-22 LAB — PROTIME-INR
INR: 1.19 (ref 0.00–1.49)
Prothrombin Time: 15 seconds (ref 11.6–15.2)

## 2010-07-22 LAB — BASIC METABOLIC PANEL
BUN: 10 mg/dL (ref 6–23)
CO2: 28 mEq/L (ref 19–32)
Calcium: 8.8 mg/dL (ref 8.4–10.5)
Chloride: 103 mEq/L (ref 96–112)
Creatinine, Ser: 0.99 mg/dL (ref 0.4–1.2)
GFR calc Af Amer: >60 mL/min (ref >60)
GFR calc non Af Amer: 56 mL/min — ABNORMAL LOW (ref >60)
Glucose, Bld: 134 mg/dL — ABNORMAL HIGH (ref 70–99)
Potassium: 3.9 mEq/L (ref 3.5–5.1)
Sodium: 136 mEq/L (ref 135–145)

## 2010-07-22 LAB — APTT: aPTT: 26 seconds (ref 24–37)

## 2010-07-22 LAB — URINE MICROSCOPIC-ADD ON

## 2010-07-22 LAB — ABO/RH: ABO/RH(D): A NEG

## 2010-07-26 ENCOUNTER — Telehealth: Payer: Self-pay

## 2010-07-26 NOTE — Telephone Encounter (Signed)
Pt called- she went on a trip yesterday and her stomach started hurting and when she went to the bathroom, she got cold, clammy, had diarrhea and passed out. Pt was taken home and the whole episode happened again but the pt saw blood this time. Pt is concerned , she stated she had tcs last fall and everything was normal. She wants to know if she just needs an office visit? Please advise

## 2010-07-26 NOTE — Telephone Encounter (Signed)
Discussed with Raynelle Fanning. Patient reports large-volume hematochezia yesterday. Episode of "passing out". Last year she had a colonoscopy that showed diverticulosis. She may have a diverticular bleed. I would advise that she go to the emergency department for evaluation. If she chooses not to at this point, would advise office visit the first of the week.

## 2010-07-26 NOTE — Telephone Encounter (Signed)
Pt aware, pt stated if it happened again she would go to ED. Pt is requesting next available appt in office.

## 2010-07-26 NOTE — Telephone Encounter (Signed)
Pt is aware of her OV on 4/4 @ 0800 w/KJ and I told her if her condition gets worse over the weekend to go to the ER

## 2010-07-31 ENCOUNTER — Ambulatory Visit: Payer: MEDICARE | Admitting: Urgent Care

## 2010-09-10 ENCOUNTER — Other Ambulatory Visit (HOSPITAL_COMMUNITY): Payer: Self-pay | Admitting: Oncology

## 2010-09-10 DIAGNOSIS — Z139 Encounter for screening, unspecified: Secondary | ICD-10-CM

## 2010-09-10 NOTE — H&P (Signed)
NAMEDEYCI, GESELL                ACCOUNT NO.:  [id number]   MEDICAL RECORD NO.:  [id number]         PATIENT TYPE:  AMB   LOCATION:  DAY                           FACILITY:  [facility]   PHYSICIAN:  R. Roetta Sessions, M.D. DATE OF BIRTH:  04/02/1941   DATE OF ADMISSION:  DATE OF DISCHARGE:  LH                              HISTORY & PHYSICAL   CHIEF COMPLAINT:  Epigastric pain and rawness.   [name] is a very pleasant 70 year old Caucasian female followed by  Dr. Mariel Sleet.  Came to see me complaining of a 2-3 month history of  just not feeling well.  She describes epigastric pain which may occur at  any time and may crescendo over half an hour and persist for three hours  before it wanes.  It may occur in the middle of the night or during the  day.  It is not at all related to eating or fasting.  It is not  associated with any bowel dysfunction.  She has not had any nausea,  vomiting, odynophagia or dysphagia, early satiety.  She has actually  gained 6-1/2 pounds in the past two years based on our weights.  She has  well-controlled gastroesophageal reflux disease on Prilosec 20 mg orally  daily.  She has not had any fever or chills, yellow jaundice, clay  colored stools, dark colored urine.  Her gallbladder was removed a good  15 years ago.   She has a history of right mastectomy breast cancer, and has been  followed by Dr. Mariel Sleet for years.  This has been felt to be acute.   Apparently Dr. Mariel Sleet did some lab work on her recently, and found  her to be anemic.  Labs from May 19, 2007 revealed a white count  8.78, H&H 10.7 and 31.2, MCV 91.4, platelet count 278,000. Additionally  she had some LFT's which were completely normal; electrolytes looked  good.  Glucose was slightly elevated at 122, BUN slightly elevated at  30.   She is not using any nonsteroidal agents.  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  She had some minimal central  intrahepatic biliary dilation which was stable compared to the prior  study and some tiny intrahepatic cysts.   She last underwent a colonoscopy by me on January 18, 2004.  She was  found have internal hemorrhoids; otherwise, normal rectum.  She had  sigmoid diverticula, and colonic mucosa appeared normal.   EGD for epigastric pain in 2001 revealed a normal upper GI tract.   [name] has noticed some fresh blood per rectum on occasion.  She  denies constipation or diarrhea.  She states her bowels are moving very  well; having one formed bowel movement daily.   PAST MEDICAL HISTORY:  Gastroesophageal reflux disease, hypertension,  asthma, breast cancer.   PAST SURGICAL HISTORY:  Back surgery, hysterectomy, cholecystectomy,  mastectomy, colonoscopy in 2005.   CURRENT MEDICATIONS:  1. Advair 250/50 daily.  2. Albuterol inhaler p.r.n.  3. Fiber Choice daily.  4. Diovan 80/12.5 daily.  5. Norvasc 5 mg daily.  6. Prilosec 20 mg daily.  7. Vitamin supplement daily.  8. Calcium daily.   ALLERGIES:  PENICILLIN.   FAMILY HISTORY:  Two sisters with lung cancer.  One brother with sinus  cancer.  No history of chronic GI or liver illness.   SOCIAL HISTORY:  The patient is married.  She is retired.  No tobacco,  no alcohol, no illicit drugs.   REVIEW OF SYSTEMS:  No chest pain, dyspnea on exertion.  No fever,  chills, night sweats, weight gain as outlined above.  Otherwise, GI  Review of Systems as outlined.   PHYSICAL EXAMINATION:  GENERAL:  Pleasant 66-year lady resting  comfortably.  VITAL SIGNS:  Weight 178.5, height 5 feet 5 inches, temp 97.3, BP  130/62, pulse 60.  SKIN:  Warm and dry.  HEENT:  No scleral icterus.  Conjunctivae are pink.  Oral cavity no  lesions.  CHEST:  Lungs clear to auscultation.  CARDIOVASCULAR:  Regular rate and rhythm without murmur, gallop or rub.  BREASTS:  Breast exams deferred.   ABDOMEN:  Nondistended.  Positive bowel sounds.  She has vague  epigastric tenderness to deep palpation.  No appreciable mass or  hepatosplenomegaly.  EXTREMITIES:  Have no edema.  RECTAL:  Deferred until time of colonoscopy.   IMPRESSION:  [name] is a 70 year old lady with intermittent  epigastric pain not temporally related to a meal, fasting or apparently  anything else.  Symptoms have been present for the past couple of  months.  They are fairly nonspecific, and reflux symptoms are well-  controlled.   She has intermittent blood per rectum.  It is good to know that she had  a colonoscopy not too long ago without significant findings.  She is  anemic.   RECOMMENDATIONS:  Will proceed with both EGD and colonoscopy in the very  near future.  I have reviewed this approach with [name] along with  potential risks, benefits, alternatives, and limitations.  Questions  were answered.  She is agreeable.  She does have some history of some  mild intrahepatic biliary dilation which is not  uncommonly seen years after cholecystectomy.  It is good to see that her  LFT's recently have been determined to be normal.  Her hepatobiliary  tree may need to be rechecked in the near future.  I will make further  recommendations once endoscopic evaluation has been carried out.      Jonathon Bellows, M.D.  Electronically Signed     RMR/MEDQ  D:  06/01/2007  T:  06/02/2007  Job:  [id number]   cc:   Ladona Horns. Mariel Sleet, MD  Fax: [phone]   Angus G. Renard Matter, MD  Fax: [phone]

## 2010-09-10 NOTE — Op Note (Signed)
[name], [name]                ACCOUNT NO.:  [id number]   MEDICAL RECORD NO.:  [id number]          PATIENT TYPE:  AMB   LOCATION:  DAY                           FACILITY:  [facility]   PHYSICIAN:  R. Roetta Sessions, M.D. DATE OF BIRTH:  1941/01/30   DATE OF PROCEDURE:  06/14/2007  DATE OF DISCHARGE:                                PROCEDURE NOTE   PROCEDURE:  Esophagogastroduodenoscopy with a brush biopsy followed by  colonoscopy with biopsy.   ENDOSCOPIST:  Jonathon Bellows, M.D.   INDICATIONS FOR PROCEDURE:  This is a 66-year lady with epigastric pain  and symptoms off and on for a couple of months not associated with  anything else including eating/fasting, reflux symptoms well controlled  on acid suppression therapy (Prilosec) and intermittent blood per  rectum.  Diagnostic EGD is now being done along with colonoscopy.  This  approach has discussed with the patient at length and potential risks,  benefits and alternatives have been reviewed.  Please see the  documentation in the medical record.   PROCEDURE NOTE:  O2 saturation, blood pressure, pulse and respirations  were monitored throughout the entire procedure.   CONSCIOUS SEDATION:  Versed 6 mg IV and Demerol 125 mg IV in divided  doses for both procedures.  Cetacaine spray for topical oropharyngeal  anesthesia.   INSTRUMENTATION:  Pentax video chip system.   FINDINGS:   ESOPHAGOGASTRODUODENOSCOPY:  Examination of the tubular esophagus  revealed ribbon-like plaques throughout most of the tubular esophagus,  overlying mucosa.  They were not raise plaques typically seen with  Candida, but they were there and they were adherent and they would not  rub off.   STOMACH:  Gastric cavity was empty and insufflated well with air.  A  thorough examination of the gastric mucosa including retroflexed view of  the proximal stomach and esophagogastric junction demonstrated no  abnormalities.  Pylorus was patent and easily traversed.   Examination of  the bulb and second portion revealed no abnormalities.   THERAPEUTIC/DIAGNOSTIC MANEUVERS PERFORMED:  Esophageal mucosa was  brushed for KOH and also biopsies of mid esophagus were taken for  histology.  The patient tolerated the procedure well and was prepared  for colonoscopy.  Digital rectal exam revealed no abnormalities.   ENDOSCOPIC FINDINGS:  The prep was good.   COLON:  Colonic mucosa was surveyed from the rectosigmoid junction  through the left, transverse and right colon to the area of the  appendical orifice, ileocecal valve and cecum.  The structures were well  seen and photographed for the record.  From this level, the scope was  slowly withdrawn.  All previous mentioned mucosal surfaces were again  seen.  The patient has scattered left-sided diverticula, but the  remainder of the colonic mucosa appeared normal.  Scope was pulled down  into the rectum, where a thorough examination of the rectal mucosa  including a retroflex view of the anal verge and en face view of the  anal canal demonstrated a single diminutive polyp at 5 cm with  surrounding mammillations.  This polyp was biopsied, cold  biopsy/removed.  The patient also had anal canal hemorrhoids; otherwise,  the rectal mucosa appeared normal.  The patient tolerated both  procedures well and was reactive after endoscopy.   IMPRESSION:   ESOPHAGOGASTRODUODENOSCOPY:  1. Ribbon-like plaques in esophageal mucosa of uncertain significance,      brushed and biopsied.  2. Normal stomach, normal first duodenum and second duodenoscopy.   COLONOSCOPY FINDINGS:  1. Diminutive rectal polyp, status post cold biopsy removal.  2. Anal canal hemorrhoids, otherwise normal rectum aside from      mammillations.  3. Left-sided diverticula.  4. Colonic mucosa appeared normal.   RECOMMENDATIONS:  1. Follow up on KOH and esophageal biopsies.  Continue Prilosec 20 mg      orally daily to the time-being.  2.  Hemorrhoid and diverticulosis literature provided to Ms. Delford Field.  A      10-day course of Anusol HC suppositories 1 per rectum at bedtime,      daily fiber supplement.  3. Further recommendations to follow.      Jonathon Bellows, M.D.  Electronically Signed     RMR/MEDQ  D:  06/14/2007  T:  06/15/2007  Job:  [id number]   cc:   Ladona Horns. Mariel Sleet, MD  Fax: [phone]   Angus G. Renard Matter, MD  Fax: [phone]

## 2010-09-13 NOTE — Assessment & Plan Note (Signed)
[facility]                          [facility] OFFICE NOTE   NAME:[name], [name]                       MRN:          [id number]  DATE:05/22/2006                            DOB:          [birth year]    REFERRING PHYSICIAN:  Angus G. Renard Matter, MD   REASON FOR OFFICE VISIT:  Post hospital followup and borderline abnormal  Cardiolite perfusion study.   HISTORY OF PRESENT ILLNESS:  The patient is a 70 year old female with a  prior history of breast cancer.  The patient is status post right  mastectomy, and is currently on hormonal therapy.  The patient has a  long-standing history of tobacco use, but quit 4 years ago.  She used to  smoke for approximately 30 years, 1 to 1-1/2 packs a day.  In addition,  she has multiple cardiac risk factors which include hypertension, which  has been untreated, a strong family history of coronary artery disease,  age, and possible dyslipidemia.  The patient was recently admitted under  the services of Dr. Doyne Keel with new onset substernal chest pain.  Last  Tuesday, the patient reported that she episodes of substernal chest  pain.  She stated the pain was intermittent, but worsened with exertion.  She took multiple doses of Mylanta with intermittent relief.  Sunday she  stated that her pain got very severe, and now was associated with severe  chest tightness which she rated as an 8/10.  She also became diaphoretic  and short of breath.  She was admitted and ruled out for myocardial  infarction.  Of note was that during her admission in the emergency  room, the patient received 3 nitroglycerins with prompt relief of her  substernal chest pressure.  She was also markedly hypertensive, and the  nitroglycerin also brought her blood pressure down.  During the course  of her hospitalization, she had no further substernal chest pain.  The  patient also reports that over the last several months, she has lost  energy and has  markedly decreased stamina.  She now reports significant  shortness of breath, really, on minimal exertion.  She states that  sometimes she has to walk up a hill and she has to stop several times.  In addition, she reports buttock pain as well as bilateral thigh pain  while walking a hill.  She also reports nocturnal cramps.  She denies,  however, any orthopnea or PND.  She has no palpitations or syncope.  Her  electrocardiogram today in the office is essentially within normal  limits.  Post hospitalization, the patient was scheduled for a  Cardiolite stress study, which demonstrated that she only could walk for  4 minutes and 44 seconds.  The patient reportedly became ashen and gray  looking, as well as markedly dyspneic.  She did not, however, have any  chest pain.  EKG changes were borderline with half a mm ST segment  depression.  Surprisingly, the perfusion study, however, did not show  definite ischemia.  Her ejection fraction was also within normal limits.  The patient now presents to the office to  further discuss the results of  her Cardiolite stress study, and discuss further plans and treatment of  her hypertension.   MEDICATIONS:  1. Advair 5 mg p.o. b.i.d.  2. Aromasin daily.  3. Prilosec over-the-counter 20 mg a day.  4. Aspirin 81 mg a day.   PAST MEDICAL HISTORY:  1. Possible asthma versus COPD followed by Dr. Orson Aloe.  2. Right mastectomy.  3. Hysterectomy.  4. Gallbladder surgery.  5. Back surgery.  6. History of gastroesophageal reflux disease.   SOCIAL HISTORY:  The patient lives in [place].  She is a former smoker as  outlined above.  She denies any alcohol use.  She is a housewife.   FAMILY HISTORY:  Notable for mother deceased from heart disease at age  31.  Father deceased at age 76 for emphysema and heart disease.  She  also has sister who has cancer, and she has a brother who has heart  disease.   ALLERGIES:  NO KNOWN DRUG ALLERGIES.   REVIEW OF  SYSTEMS:  Positive as outlined above for dyspnea and  substernal chest pain.  Positive also for weight gain and fatigue.  Positive for claudication and nausea.  Positive for heart burn.  Patient  also reports nocturnal cramps.  No melena or hematochezia, no dysuria or  frequency.  Positive for weakness.  No bruising, no syncope.  No gait  disturbance.  The remainder of the review of systems is otherwise  negative.   PHYSICAL EXAMINATION:  VITAL SIGNS:  Blood pressure 180/86.  Heart rate  is 60 beats per minute.  Weight is 173.8.  GENERAL:  Well-nourished white female.  No apparent distress.  HEENT:  Pupils, eyes, sclerae and conjunctivae are clear.  NECK:  Supple, normal carotid upstrokes, no carotid bruits.  LUNGS:  Somewhat diminished breath sounds bilaterally, but no crackles.  HEART:  Regular rate and rhythm with normal S1, S2, and no murmurs,  rubs, or gallops.  ABDOMEN:  Soft, nontender, no rebound or guarding.  Good bowel sounds.  EXTREMITIES:  Reveals no cyanosis, clubbing, or edema.  Dorsalis pedis  and posterior tibial pulses are only 1+ bilaterally.  There are no  atrophic skin lesions.  NEURO:  The patient is alert and oriented and grossly nonfocal.   PROBLEM LIST:  1. Substernal chest pain.      a.     Rule out ischemic heart disease.      b.     Equivocal Cardiolite perfusion study with borderline EKG       criteria for ischemia.      c.     Multiple cardiac risk factors.  2. Dyspnea (NYHA class II B/III).      a.     Questionable history of mild lung disease followed by Dr.       Orson Aloe.      b.     Rule out ischemic heart disease as above.      c.     Rule out hypertensive heart disease (patient markedly       hypertensive today in the office).  3. History of breast cancer.      a.     Status post right mastectomy.      b.     Maintenance hormonal therapy.  4. History of gastroesophageal reflux disease.   PLAN: 1. Despite the fact that the patient has only a  borderline Cardiolite      perfusion study with no perfusion defects by Cardiolite imaging,  her pretest probability for coronary artery disease is quite high.      Her symptoms are very concerning for underlying ischemic heart      disease, and I have recommended to the patient to proceed with a      cardiac catheterization.  This will include a left and right heart      catheterization, particularly in light of her marked symptoms of      dyspnea (the latter predominantly to rule out pulmonary      hypertension).  2. The patient, at the minimum, also appears to have hypertensive      heart disease, and has uncontrolled blood pressure.  I have started      her on Norvasc, given her relatively baseline low heart rate.  I      have also given a prescription for p.r.n. nitroglycerin.  3. Discuss the risks and benefits of the procedure with the patient.      She is willing to proceed with a catheterization early next week in      our [facility] lab.     Learta Codding, MD,FACC  Electronically Signed    GED/MedQ  DD: 05/22/2006  DT: 05/22/2006  Job #: [id number]   cc:   Angus G. Renard Matter, MD  Wende Crease, MD

## 2010-09-13 NOTE — Assessment & Plan Note (Signed)
[facility]                          [facility]ICE NOTE   NAME:[name], [name]                       MRN:          [id number]  DATE:06/11/2006                            DOB:          1940/09/12    HISTORY OF PRESENT ILLNESS:  The patient is a 70 year old female with  multiple cardiac risk factors.  She was recently admitted to [facility]  with chest pain.  Her symptoms were concerning for angina.  She,  however, had a negative stress test.  She also ruled out for myocardial  infarction.  However, her symptoms remained concerning and we proceeded  with a cardiac catheterization.  This essentially demonstrated a  nonobstructive coronary disease with a 40% lesion in the left circumflex  coronary artery.  LV function was normal and she had normal right heart  hemodynamics.  She also had no significant aortic iliac disease.   The patient was quite hypertensive and she was started on the last  office visit on Norvasc.  This has now improved and she states, overall,  that she feels a little bit better, but still continues to have dyspnea  on minimal exertion.  She also has chest pain on the right side of the  chest, where she had previously reconstructive breast surgery done.   MEDICATIONS:  1. Advair 500/50 mcg p.o. b.i.d.  2. Aromasin daily.  3. Prilosec over-the-counter.  4. Aspirin 81 mg a day.  5. Norvasc 5 mg p.o. daily.   PHYSICAL EXAMINATION:  VITAL SIGNS:  Blood pressure 138/66, heart rate  66, weight 178 pounds.  GENERAL:  A well-nourished white female in no apparent distress.  HEENT:  Pupils and eyes clear.  Conjunctivae clear.  NECK:  Supple.  Normal carotid upstroke.  No carotid bruits.  LUNGS:  Clear breath sounds bilaterally.  HEART:  Regular rate and rhythm.  Normal S1, S2.  No murmurs, rubs, or  gallops.  ABDOMEN:  Soft and nontender.  EXTREMITIES:  No cyanosis, clubbing, or edema.   PROBLEM LIST:  1. Nonobstructive  coronary artery disease.  2. Normal left ventricular function.  3. Normal right heart hemodynamics.  4. No significant aortoiliac disease.  5. Marked dyspnea, New York Heart Association class IIB-III.  6. History of breast cancer and breast reconstructive surgery.  7. Rule out lung disease.   PLAN:  1. The patient's symptoms remain puzzling.  She continues to have      exertional dyspnea and chest pain.  She states that she has      improved since taking Norvasc.  I do not think that the patient has      syndrome X at this point in time.  2. The patient will be scheduled for an echographic study to make sure      that she does not have any significant valvular heart disease.  I      cannot hear any significant murmurs on heart exam, however.  3. If the workup remains negative, the patient can be considered for      CPX testing to make sure that indeed she does not  have an element      of deconditioning or underlying lung disease.     Learta Codding, MD,FACC  Electronically Signed    GED/MedQ  DD: 06/11/2006  DT: 06/11/2006  Job #: [id number]   cc:   Angus G. Renard Matter, MD

## 2010-09-13 NOTE — Discharge Summary (Signed)
NAME:  [name], [name]                          ACCOUNT NO.:  [id number]   MEDICAL RECORD NO.:  [id number]                   PATIENT TYPE:  INP   LOCATION:  5739                                 FACILITY:   PHYSICIAN:  Alfredia Ferguson, M.D.               DATE OF BIRTH:  28-Aug-1940   DATE OF ADMISSION:  01/04/2003  DATE OF DISCHARGE:  01/06/2003                                 DISCHARGE SUMMARY   ADMISSION DIAGNOSES:  1. Right breast cancer.  2. Acquired absence right breast.   DISCHARGE DIAGNOSES:  1. Right breast cancer.  2. Acquire absence right breast.   OPERATION:  1. Removal of previously placed subpectoral tissue expander on right.  2. Delayed breast reconstruction with transverse rectus abdominus     myocutaneous flap, right rectus muscle used for flap.   CHIEF COMPLAINT:  I had my breast removed and now a tissue expander, but  want to change to a TRAM flap.   HISTORY OF PRESENT ILLNESS:  This is a 70 year old woman who underwent right  mastectomy and lymph node dissection in March 2004.  [place] was not available for reconstruction.  She  subsequently was seen by me and underwent placement of a tissue expander on  a delayed basis approximately two months after her mastectomy.  In the  meantime, she has decided to proceed with reconstruction with autologous  tissue and wishes to have the tissue expander removed and a TRAM flap  placed.  The patient has been counseled regarding the potential risks of the  surgery and in spite of these risks wishes to proceed with TRAM  reconstruction.  The patient states that her lymph nodes were negative.  She  did not receive radiation therapy.   PAST MEDICAL HISTORY:  Significant for mild depression and anxiety.   SOCIAL HISTORY:  The patient is married.  She denies alcohol or tobacco use.  She was smoking at the time of her mastectomy, but has been off cigarettes  now for approximately  seven months.   PAST SURGICAL HISTORY:  Hysterectomy in 2003, left knee arthroscopy, right  mastectomy, tissue expander insertion.   REVIEW OF SYSTEMS:  Please see admission H&P for review of systems.   PHYSICAL EXAMINATION:  Please see H&P for complete physical exam.   ADMISSION LABORATORY VALUES:  CBC with hemoglobin 12.1, hematocrit 35.6.  She had normal electrolytes.  Her blood sugar was slightly elevated at 105.  Chest x-ray showed no evidence of cardiopulmonary disease.  Cardiogram  showed sinus bradycardia with left atrial enlargement.  Possible interior  infarct of age indeterminate.  This was an unconfirmed readout.  Subsequent  read of the cardiogram was read out as borderline EKG.   HOSPITAL COURSE:  On the day of admission, the patient was taken to the  operating room where she underwent removal of the right subpectoral tissue  expander.  She had a right transverse rectus abdominus myocutaneous flap.  Postoperative course has been completely uneventful.  She was started on a  diet on the night of surgery.  The diet was advanced the following day.  She  was ambulating on the first postoperative day.  Drainage has been minimal  and clear.  The TRAM flap is a buried TRAM flap and all that is exposed are  superior and inferior breast flaps.  At the time of discharge, her TRAM is  very soft.  She has slight amount of erythema and edema of the inferior  breast flap.  She is taking Cipro during her hospitalization. Abdominal  wound looks great.  No sign of infection.  Drainage is minimal from her  Blake drains.  Umbilicus is viable.  The patient is being discharged at this  time tolerating a regular diet, ambulating without assistance and with  minimal amount of discomfort.   DISCHARGE MEDICATIONS:  1. Cipro 500 mg b.i.d.  2. Vicodin one to two q.4h p.r.n. pain.   Followup will be provided on September 15 in my office.  The patient was  advised on her postop instructions both  verbally and in writing.  She  understands that she may return to the office earlier if her drains are  draining less than 30 ml per 24 hours or if she proceeds herself to be  having a problem.                                                  Alfredia Ferguson, M.D.    WBB/MEDQ  D:  01/06/2003  T:  01/06/2003  Job:  [id number]

## 2010-09-13 NOTE — Discharge Summary (Signed)
[facility]  Patient:    [name] Visit Number: [id number] MRN: [id number]          Service Type: EMS Location: ED Attending Physician:  Rosalyn Charters Dictated by:   Christin Bach, M.D. Admit Date:  06/08/2001 Discharge Date: 06/08/2001                             Discharge Summary  ADMITTING DIAGNOSIS:  Simple cystic pelvic mass.  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  PROCEDURE:  Bilateral salpingo-oophorectomy.  SURGEON:  Christin Bach, M.D.  DISCHARGE MEDICATIONS: 1. Tylox one q.4h. p.r.n. severe pain. 2. Colace one twice daily x2 weeks. 3. Motrin 800 mg one q.6-8h. p.r.n. pain. 4. Mylanta two tsp. after each meal if needed for heartburn.  HISTORY OF PRESENT ILLNESS:  This 70 year old female is status post vaginal hysterectomy and was admitted for a cystic pelvic mass noted on CT scan and confirmed on bimanual exam with simple structure measuring 8 x 6.7 x 10.8 cm without septations.  The patient had a normal CA-125.  HOSPITAL COURSE:  The patient underwent laparotomy on June 03, 2001, where the ovary was significantly smaller than previously expected.  There were significant adhesions from the right ovary to the pelvic side wall and to the cuff consistent with her occasional discomfort on the right.  She underwent bilateral salpingo-oophorectomy and postoperatively had excellent pain control with blood pressure stable.  Hemoglobin 11, hematocrit 31, white count 11,400. On postoperative Gould #2, the patient was passing gas, tolerating oral intake and was sent home for follow-up in one week for staple removal and four weeks for routine postoperative check. Dictated by:   Christin Bach, M.D. Attending Physician:  Rosalyn Charters DD:  06/27/01 TD:  06/28/01 Job: [id number]
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
## 2010-09-13 NOTE — Op Note (Signed)
NAME:  [name], [name]                          ACCOUNT NO.:  [id number]   MEDICAL RECORD NO.:  [id number]                   PATIENT TYPE:  AMB   LOCATION:  [name]                                  FACILITY:  [facility]   PHYSICIAN:  Barbaraann Barthel, M.D.              DATE OF BIRTH:  1940/06/21   DATE OF PROCEDURE:  07/19/2002  DATE OF DISCHARGE:                                 OPERATIVE REPORT   SURGEON:  Barbaraann Barthel, M.D.   PREOPERATIVE DIAGNOSIS:  Adenocarcinoma of the right breast.   PROCEDURE:  Right modified radical mastectomy.   INDICATIONS FOR PROCEDURE:  This is a 70 year old white female who upon  serial mammography was noted to have an abnormality with a palpable mass.  She was referred to me by the OB/GYN service.  We had planned for a biopsy  to rule out carcinoma.  This was done.  She was found to have adenocarcinoma  of the breast.  We discussed further surgical options with her, and she  opted for a right modified radical mastectomy.  She also saw Dr. Emelda Fear  and Dr. Glenford Peers in the preoperative period for consultation.   GROSS OPERATIVE FINDINGS:  The patient had a centrally located lesion with  positive margins from the biopsy and small breasts.  That is why we opted  for a modified radical mastectomy for local control in her case.  Otherwise,  there were some palpable lymph nodes in the axilla.  These were removed.  There were no other abnormalities noted.   SPECIMENS:  Axillary content sent separately and right mastectomy.   TECHNIQUE:  The patient was placed in the supine position, and after the  adequate administration of general anesthesia by endotracheal intubation,  her entire right hemithorax was prepped with Betadine solution and draped in  the usual manner.  A limb isolator was used.   An elliptical incision was carried out so that a transverse incision was  used to remove the breast.  This was done, removing the breast completely  around the  biopsy site and removing the fascia of the pectoralis major  muscle.  The breast was then sent separately.  The axillary content was then  dissected free from the axilla.  Bleeders were controlled with a Hemoclip  device and tied with 2-0 silk.  We identified the thoracodorsal nerve and  the area of the long thoracic nerve, and this remained free from the area of  dissection.  After removing the axillary content and sending this  separately, the wound was then irrigated with sterile water solution.  Through separate stab wound incisions, two Jackson-Pratt drains were placed,  one below the superior flap and the other in the right axilla.  The  skin was approximated with a stapling device, and the drains were sutured in  place with 3-0 nylon.  Prior to closure, all sponge, needle, and instrument  counts were  found to be correct.  Estimated blood loss was minimal.  The  patient received 900 cc of crystalloid intraoperatively.  There were no  complications.                                               Barbaraann Barthel, M.D.    WB/MEDQ  D:  07/19/2002  T:  07/19/2002  Job:  [id number]   cc:   Tilda Burrow, M.D.  [address] [place]  [place] [address]  Fax: [phone]

## 2010-09-13 NOTE — H&P (Signed)
[facility]  Patient:    [name], [name] Visit Number: [id number] MRN: [id number]          Service Type: Attending:  Christin Bach, M.D. Dictated by:   Christin Bach, M.D. Adm. Date:  06/01/01                           History and Physical  ADMITTING DIAGNOSIS:  Simple cystic pelvic mass.  HISTORY OF PRESENT ILLNESS:  This is a 70 year old female who is status post vaginal hysterectomy in 1973, status post anterior and posterior repair with enterocele repair in 1999.  She is admitted at this time for exploratory laparotomy for bilateral salpingo-oophorectomy with frozen sections and consideration of additional surgeries including omentectomy depending on findings.  [name] was seen in our office in January for evaluation of right lower quadrant pain.  She has had progressive pain for approximately six months of an intermittent nature.  When seen in August 2002, she had a vague fullness in the right adnexa felt to be bowel.  This was within one month after an extensive evaluation had shown no evidence of a pelvic mass.  The patient had a CT scan performed without contrast November 06, 2000, which showed no evidence of pelvic abnormalities other than surgically absent uterus.  The patient had been considered for laparoscopy to evaluate the discomfort and fullness in August but declined consideration of further work-up at that time. The pain had progressed, and she was seen in January.  At that time, she complained of multiple symptoms.  First of all she had complained of alternating constipation and normal bowel movements with stool softener used routinely.  She has had some difficulty requiring splinting to defecate. There has been no use of suppositories.  She was seen in the emergency room x2 during 2002 for irregular right lower quadrant pain which occasionally awakened the patient.  She had similar complaints evaluated by Dr. Lottie Dawson in the 1990s and was being  considered for salpingo-oophorectomy but the ovarian cyst resolved.  She has to stop weightlifting exercises secondary to pain.  The patient has had a 20 pound weight loss since the time frame of October 2002, but has been at [employer] and deliberately attempting to lose weight. She has been working out as well.  PAST MEDICAL HISTORY:  Gastroesophageal reflux disease.  ALLERGIES:  PENICILLIN causes edema and tongue swelling.  PAST SURGICAL HISTORY:  Vaginal hysterectomy in 1970s for pelvic relaxation. Back surgery in 1981.  Cholecystectomy in 1992.  Anterior and posterior repair May 1999.  Colonoscopy January 2001.  Hyperplastic polyp which was noted and removed.  GI consult July 2002 with negative evaluation.  PHYSICAL EXAMINATION:  GENERAL:  Exam shows a healthy-appearing, slim, Caucasian female, alert, oriented x 3.  HEENT:  Pupils are equal, round and reactive. Extraocular movements intact.  CHEST:  Lungs are clear to auscultation.  ABDOMEN:  Vague right lower quadrant tenderness to deep palpation.  Bimanual reveals mass effect in the right side of the pelvis, and vaginal ultrasound shows a single simple cystic structure, 8 x 6 x 7.0 x 10.8 cm without septation or internal excrescences.  A CA-125 is ordered and pending at this time.  ASSESSMENT AND PLAN:  Simple cystic right adnexal mass.  Schedule for exploratory laparotomy and bilateral salpingo-oophorectomy, possible additional surgeries to be performed June 03, 2001.  We have had an extensive discussion.  The possibility of benign and malignant etiologies have been discussed.  The possibility of additional surgeries would be necessary if the frozen section suggests malignancy.  Videos have been reviewed with omentectomy explained to the patient as well as need for peritoneal biopsies or possible surgery to adjacent tissue structures. Dictated by:   Christin Bach, M.D. Attending:  Christin Bach, M.D. DD:   06/01/01 TD:  06/01/01 Job: [id number]

## 2010-09-13 NOTE — Op Note (Signed)
[facility]  Patient:    [name], [name] Visit Number: [id number] MRN: [id number]          Service Type: EMS Location: ED Attending Physician:  Rosalyn Charters Dictated by:   Christin Bach, M.D. Proc. Date: 06/03/01 Admit Date:  06/08/2001 Discharge Date: 06/08/2001                             Operative Report  PREOPERATIVE DIAGNOSES:  Cystic pelvic, postmenopausal, adnexal mass, right lower quadrant pain.  POSTOPERATIVE DIAGNOSES:  Pelvic adhesions.  PROCEDURE:  Bilateral salpingo-oophorectomy.  SURGEON:  Christin Bach, M.D.  ASSISTANT:  Duane Lope, M.D.  ANESTHESIA:  General.  COMPLICATIONS:  None.  ESTIMATED BLOOD LOSS:  50 cc.  FINDINGS:  Smaller right ovary than anticipated with adhesions from right ovary to right pelvic side wall and to the pelvic cuff.  No evidence of malignancy suspected.  DETAILS OF PROCEDURE:  The patient was taken to the operating room and prepped and draped in the usual fashion for lower abdominal surgery with a Foley catheter inserted.  Lower abdominal vertical incision was performed, removing the old cicatrix and entering the abdominal cavity without difficulty.  The abdomen was explored and the suspected larger right ovary was not confirmed. The ovary was relatively small in size.  The ovary was stuck, unfortunately, to the right side wall and to the pelvic floor which was a likely source of the patients pelvic discomfort which had become clinically significant.  The patient had packing away of the bowel, placement of Balfour clamps. Laparotomy bladder retractor in place.  We then proceeded to remove both tubes and ovaries.  The left side was easily identified, was mobile, and the infundibulopelvic ligament easily isolated on the left side.  The ureter was confirmed as being out of harms way by making a retroperitoneal window just lateral to the IP ligament, dissecting in the retroperitoneum sufficiently  to identify the ureter, then cross clamping the IP ligament as to free it up from the pelvis.  IP ligament was clamped, cut, and suture ligated.  The tube and ovary were shaved off of the side wall without difficulty and a cross clamping performed of the attachments of the residual utero-ovarian ligament to the pelvic side wall.  Hemostasis was complete.  The right side was treated with similar tactics.  Using retroperitoneal dissection to identify the ureter and cross clamping the infundibulopelvic ligament, peeling the adhesions free with sharp dissection, then cross clamping any residual adhesions from the central tip of the ovary to the pelvic side wall.  The attachments to the cuff were similarly removable so the patient then had ligature of the right IP ligament followed by irrigation of the abdomen, confirmation of hemostasis, removal of laparotomy equipment, 2-0 chromic closure of the anterior peritoneum, 0 PDS closure to the fascial incision, subcutaneous 2-0 plain used on the skin, and then staple closure of the skin completing procedure with estimated blood loss 50 cc. Dictated by:   Christin Bach, M.D. Attending Physician:  Rosalyn Charters DD:  06/27/01 TD:  06/28/01 Job: [id number]

## 2010-09-13 NOTE — Cardiovascular Report (Signed)
NAMEJELINA, [name]                ACCOUNT NO.:  [id number]   MEDICAL RECORD NO.:  [id number]          PATIENT TYPE:  OIB   LOCATION:  1961                         FACILITY:  [facility]   PHYSICIAN:  Veverly Fells. Excell Seltzer, MD  DATE OF BIRTH:  10-13-1940   DATE OF PROCEDURE:  05/26/2006  DATE OF DISCHARGE:                            CARDIAC CATHETERIZATION   PROCEDURE:  Left heart catheterization, right heart catheterization,  selective coronary angiography, left ventricular angiography, abdominal  aortic angiography.   INDICATIONS:  Ms. Tool is a 70 year old woman with multiple  cardiovascular risk factors who presented to the [facility] emergency room  with chest pain.  Her symptoms were compelling for angina.  She has  predominately exertional chest pain.  She also complains of severe  dyspnea on exertion.  She was subsequently referred for a right and left  heart catheterization.  She has also been noted to have hip claudication  with walking up hills, so I elected to do an abdominal aortogram as well  to evaluate the distal aorta and proximal iliac arteries.   PROCEDURAL DETAILS:  Risks and indications of the procedure were  explained to the patient.  Informed consent was obtained.  The right  groin was prepped, draped, and anesthetized with 1% lidocaine.  Using  the modified Seldinger technique, a 4-French sheath was placed in the  right femoral artery, and an 8-French sheath was placed in the right  femoral vein.  The vein was difficult to locate, but eventually access  was obtained.  The right heart catheterization was performed first, and  pressures were recorded throughout the right heart chambers from the  right atrium to the pulmonary capillary wedge position.  Oxygen  saturations were drawn from both the pulmonary artery and the proximal  aorta.  Simultaneous pulmonary capillary wedge pressure and left  ventricular pressures were recorded.  A 30-degrees RAO left  ventriculogram  was done.  A pullback across the aortic valve was  performed.  An abdominal aortogram was performed.  Following left  ventriculography and abdominal angiography, selective coronary  angiography was performed.  For the left coronary artery, a 4-French JL-  4 catheter was used. For the right coronary artery, a 4-French 3-DRC  catheter was used.  At the conclusion of the case, the sheaths were  pulled and manual pressure used for hemostasis.   FINDINGS:  Right atrial pressure:  A-wave 0.6 V-wave 3, mean of 3, right  ventricular pressure 23/0, with an end-diastolic pressure of 3.  Pulmonary artery pressure 23/7, with a mean of 14.  Pulmonary capillary  wedge pressure:  A-wave 90 V-wave 8, mean of 8.  Left ventricular  pressure 156/3, with an end-diastolic pressure of 6.  Aortic pressure  156/62, with a mean of 101.   Oxygen saturation in the pulmonary artery was 70%.  Oxygen saturation in  the aorta was 94%. Cardiac output by the Fick method was 4.3  liters/minute.   CORONARY ANGIOGRAPHY:  Left mainstem was angiographically normal.  It  trifurcated into the LAD, a small ramus branch, and the left circumflex.  The LAD is  a medium-caliber vessel that courses down to the left  ventricular apex.  It gives off two diagonal branches and a large septal  perforator from the midportion of the vessel.  There is no significant  angiographic disease in the LAD.  The ramus intermedius branch is small  and is angiographically normal.   Left circumflex is a large-caliber vessel that gives off three OM  branches from the midportion of the vessel.  There is a 40% stenosis in  the mid-circumflex just prior to the branch vessels.  There does not  appear to be any obstructive disease in the left circumflex.   The right coronary artery is medium caliber and gives off a PDA as well  as a posterior AV segment which gives off one posterolateral branch.  There is no significant angiographic disease throughout  the right  coronary artery.  There is a large RV marginal branch from the  midportion of the vessel.   Left ventriculography shows normal LV function, with a left ventricular  ejection fraction of 65%.   Abdominal aortogram demonstrates patent renal arteries bilaterally.  The  distal aorta has mild luminal irregularities, and the proximal iliac  arteries have no significant angiographic disease.   ASSESSMENT:  1. Nonobstructive coronary artery disease, with a 40% lesion in the      left circumflex.  2. Normal left ventricular function.  3. Normal right heart hemodynamics.  4. No significant aortoiliac disease.   RECOMMENDATIONS:  Recommend continued medical therapy for the patient's  coronary artery disease.  Her hemodynamics are excellent and certainly  do not account for her exertional dyspnea.      Veverly Fells. Excell Seltzer, MD  Electronically Signed     MDC/MEDQ  D:  05/26/2006  T:  05/26/2006  Job:  [id number]   cc:   Learta Codding, MD,FACC

## 2010-09-13 NOTE — Op Note (Signed)
NAME:  [name], [name]                          ACCOUNT NO.:  [id number]   MEDICAL RECORD NO.:  [id number]                   PATIENT TYPE:  AMB   LOCATION:  [name]                                  FACILITY:  [facility]   PHYSICIAN:  Barbaraann Barthel, M.D.              DATE OF BIRTH:  02-12-1941   DATE OF PROCEDURE:  07/06/2002  DATE OF DISCHARGE:                                 OPERATIVE REPORT   PREOPERATIVE DIAGNOSIS:  Abnormal right mammogram.   POSTOPERATIVE DIAGNOSIS:  Final pathology pending.   PROCEDURE:  Needle localization and right partial mastectomy.   SPECIMENS:  Right breast tissue.   CLINICAL NOTE:  This is a 70 year old white female who had an abnormal right  mammogram on serial mammography.  She was referred by Dr. Rayna Sexton office.  Mammography showed that she had a suspicious lesion.  Biopsy  was  recommended.   We discussed the procedure in detail with the patient and since clinically I  was not quite sure that I was feeling what was visualized on mammography, we  discussed needle localization and biopsy, discussing complications not  limited to but including bleeding, infection, and the possibility that  further surgery may be required.  Informed consent was obtained.   GROSS OPERATIVE FINDINGS:  The patient had a serous type of a lesion  approximately 1 cm or so in diameter, which was very suspicious to me for  carcinoma of the breast.  Specimen mammography revealed that we had removed  the area in question.   DESCRIPTION OF PROCEDURE:  The patient was placed in a supine position and  after the adequate administration of general anesthesia, the right  hemithorax was prepped with Betadine solution and draped in the usual  manner.  This lesion had been localized by sonography.  An X had been  placed over the area in the midportion of the breast near the nipple-areolar  complex.  The wire entered medially to this.  An areolar incision was  carried out and the wire  was then dissected into the area of the incision,  and then I did an elliptical incision around the wire.  Clinically this  looked very suspicious for carcinoma of the breast to me.  The wound was  irrigated with normal saline solution, the specimen was sent to mammography,  and the suspicious area was confirmed to be present.  The wound was then  cauterized with the cautery device.  The breast tissue was approximated with  3-0  Polysorb and a subcuticular incision with 5-0 Polysorb was used.  No drain  was placed.  Estimated blood loss was minimal.  The patient received 700 mL  of crystalloids intraoperatively.  There were no complications.  I will plan  to discuss the diagnosis when it is known with the patient.  Barbaraann Barthel, M.D.    WB/MEDQ  D:  07/06/2002  T:  07/06/2002  Job:  [id number]   cc:   Tilda Burrow, M.D.  [address] [place]  [place] [address]  Fax: [phone]

## 2010-09-13 NOTE — Op Note (Signed)
NAME:  [name], [name]                          ACCOUNT NO.:  [id number]   MEDICAL RECORD NO.:  [id number]                   PATIENT TYPE:  AMB   LOCATION:  DSC                                  FACILITY:  [facility]   PHYSICIAN:  Alfredia Ferguson, M.D.               DATE OF BIRTH:  12/26/1940   DATE OF PROCEDURE:  04/04/2003  DATE OF DISCHARGE:                                 OPERATIVE REPORT   PREOPERATIVE DIAGNOSIS:  1. Right breast cancer.  2. Acquired absence of right breast.  3. Asymmetry of left breast with reconstructed right breast.  4. Dog ears bilateral transverse rectus abdominis myocutaneous donor site     incision, lower abdomen.   POSTOPERATIVE DIAGNOSIS:  1. Right breast cancer.  2. Acquired absence of right breast.  3. Asymmetry of left breast with reconstructed right breast.   OPERATION PERFORMED:  1. Left breast mastopexy.  2. Excision of residual dog ears bilateral corner of abdominal donor site     incision.  3. Right nipple reconstruction with tripartite flap.   SURGEON:  Alfredia Ferguson, M.D.   ASSISTANT:  Jacky Kindle, RN,FA.   ANESTHESIA:  General endotracheal.   INDICATIONS FOR PROCEDURE:  The patient is a 70 year old woman who is status  post right mastectomy reconstructed with a TRAM flap.  She has asymmetry  between the reconstructed breast and the native breast.  She wishes to lift  the left breast in order to equalize its appearance with the reconstructed  breast.  She also wishes to have a right nipple reconstruction.  The patient  has dog ears in both lateral corners of her TRAM donor site.  She would like  to have those dog ears removed.  The patient understands the risks of the  surgery including bleeding, infection, unsightly scarring, wound healing  difficulties at the mastopexy site.  She also understands the risks of  asymmetry with location of the reconstructed nipple on the right compared to  the left side.  As for the dog ears, the patient  understands that I may not  be able to remove the dog ears in their entirety.  In spite of these risks,  the patient wishes to proceed with the surgery.   DESCRIPTION OF PROCEDURE:  On the day prior to surgery, skin markers were  placed outlining the dimensions of the dog ears.  A Wise skin pattern was  placed on the left breast for her mastopexy.  The nipple reconstruction was  marked with a tripartite flap with the three points pointing to the 12  o'clock, 3 o'clock and 9 o'clock position.  The patient was taken to the  operating room where she was given general endotracheal anesthesia. The  chest and abdomen were prepped with Betadine and draped with sterile drapes.  Attention was first directed to the native breast.  A 42 diameter skin  marker was placed around the  nipple and this skin marker was incised.  The  previously placed incisions in the inframammary crease and the mediolateral  and superior breast flaps were also made.  Within the confines of these  incisions, all the skin was removed in a de-epithelializing pattern with the  exception of the nipple areolar complex.  The cut edge of the skin of the  medial and lateral flaps was incised at the de-epithelialized location in  order to release the edge of these flaps.  The vertical limb of the medial  and lateral breast flap were also incised at the cut edge of the skin with  the de-epithelialization.  Following hemostasis, the nipple areolar complex  was lifted to its new location and fixed in position using interrupted 2-0  Vicryl suture.  The inferior corner of the vertical limb of the medial and  lateral breast flap was united with a 2-0 Vicryl to the midportion of the  inframammary crease.  The superior corner of the vertical limb of the medial  and lateral breast flap were united to each other with a similar suture.  The inframammary crease incision was closed using multiple interrupted 2-0  Vicryl for the dermis and 3-0  Monocryl sutures for the dermis.  The vertical  limb was closed in a similar fashion. The remainder of the areola was closed  to the skin edge with multiple interrupted 3-0 Monocryl sutures.  The  vascularity of the nipple areolar complex was excellent at the conclusion of  the procedure.  Attention was directed to the left dog ear which had  previously been marked.  This dog ear measured approximately 10 cm in length  and 4 cm in width.  This dog ear was incised and the excess skin and excess  fat was removed using electrocautery.  Hemostasis was accomplished using  pressure.  The wound edges were approximated with staples and attention was  directed to the right dog ear where an identical excision was carried out.  Again, the skin edges were approximated with skin staples.  The  reconstructed nipple skin markers on the right reconstructed breast were  incised.  The three points of the flap were elevated down to the  approximately 2.5 cm wide random skin pedicle which inferiorly located.  The  tips of the three flaps were excised creating blunt ends.  The 3 o'clock and  9 o'clock flap were rotated towards one another and the two blunt ends were  fixed to each other using interrupted 4-0 chromic suture.  The 12 o'clock  flap was placed down on top of the created cylinder and fixed in position  using interrupted 4-0 chromic sutures.  The donor site was closed using  multiple interrupted 4-0 PDS and 3-0 Monocryl for the dermis followed  by a  running 4-0 chromic for the skin edges.  Vascularity of the nipple appeared  to be excellent at conclusion of the procedure.  Attention was redirected to  the dog ear site.  The incision was closed using multiple interrupted 3-0  Monocryl sutures for the dermis.  Skin staples were removed and the skin  edges were closed with Steri-Strips.  Closure on both sides was carried out  in identical fashion.  The patient tolerated the entire procedures well. The  estimated blood loss was less than 25 mL.  The patient's chest and  abdomen were cleansed of all Betadine and dried.  Steri-Strips were applied  to all incisions.  Bulky dressing was placed over the  reconstructed nipple  and the mastopexy site and fixed in position with a light amount of tape.  Similar light dressings were applied on the dog ear excision sites.  The  patient was awakened, extubated and transported to the recovery room in  satisfactory condition.                                               Alfredia Ferguson, M.D.    WBB/MEDQ  D:  04/04/2003  T:  04/05/2003  Job:  [id number]

## 2010-09-13 NOTE — Procedures (Signed)
[name], [name] NO.:  [id number]   MEDICAL RECORD NO.:  [id number]            PATIENT TYPE:   LOCATION:                                 FACILITY:   PHYSICIAN:  Vida Roller, M.D.   DATE OF BIRTH:  1940-10-25   DATE OF PROCEDURE:  DATE OF DISCHARGE:                                    STRESS TEST   HISTORY OF PRESENT ILLNESS:  [name] is a 70 year old female with no  known coronary disease with complaints of dyspnea on exertion and atypical  chest discomfort.  Cardiac risk factors include tobacco abuse, unknown lipid  status.  She had an adenosine Cardiolite in 6/02 that revealed no ischemia  and normal EF.   The patient has undergone a CT scan that revealed no pulmonary embolus and  she is currently undergoing evaluation by a pulmonologist.   BASELINE DATA:  EKG reveals a sinus rhythm at 61 beats per minute with  nonspecific ST abnormalities and poor R wave progression.  Blood pressure is  150/80.   Patient exercised for a total of 4 minutes and 22 seconds in BRUCE protocol  stage 1, 4.6 METS.  Maximum heart rate was 132 beats per minute which is 85%  of predicted.  Maximum blood pressure is 220/78 and resolved down to 130/70  in recovery.  The patient did report shortness of breath shortly after  starting exercise.  With minimal exertion, she appeared to be extremely  short of breath.  This resolved in recovery.  She denied any chest  discomfort.  Exercise was stopped secondary to shortness of breath.  EKG  revealed some minimal ST changes in the inferior leads.  She also had  frequent PACs with intermittent atrial bigeminy.   The patient had poor exercise tolerance secondary to shortness of breath and  markedly elevated blood pressure with minimal exercise.  She has no history  of hypertension.   Final images and results are pending MD review.      AB/MEDQ  D:  06/14/2004  T:  06/14/2004  Job:  [id number]

## 2010-09-13 NOTE — Op Note (Signed)
NAME:  [name], [name]                          ACCOUNT NO.:  [id number]   MEDICAL RECORD NO.:  [id number]                   PATIENT TYPE:  INP   LOCATION:  2550                                 FACILITY:  [facility]   PHYSICIAN:  Alfredia Ferguson, M.D.               DATE OF BIRTH:  [birth year]   DATE OF PROCEDURE:  01/04/2003  DATE OF DISCHARGE:                                 OPERATIVE REPORT   PREOPERATIVE DIAGNOSIS:  1. Right breast cancer.  2. Acquired absence, right breast.   POSTOPERATIVE DIAGNOSIS:  1. Right breast cancer.  2. Acquired absence, right breast.   OPERATION PERFORMED:  1. Delayed breast reconstruction with transverse rectus abdominis     myocutaneous flap.  2. Removal of previously placed right subpectoral tissue expander.   SURGEON:  Alfredia Ferguson, M.D.   ANESTHESIA:  General endotracheal   INDICATIONS FOR PROCEDURE:  The patient is a 70 year old woman who underwent  mastectomy in March of 2004 for the treatment of breast cancer.  She  initially decided to not have breast reconstruction.  She came back several  months later wishing to have breast reconstruction with implants.  She had a  tissue expander placed.  She subsequently decided that she wishes to switch  from implant reconstruction to TRAM reconstruction.  The plan is to take the  patient to the operating room today for removal of the tissue expander and  then placement of a buried TRAM flap.  The potential risks of this surgery  including partial or complete loss of the TRAM flap, fat necrosis,  infection, bleeding, hematoma, seroma, the need for transfusion, asymmetry,  not being made large enough, being made too large, abdominal wound healing  problems, abdominal hernias, asymmetry of the scar, vascular compromise of  the umbilicus and difficulty healing.  In spite of these and other risks,  the patient wished to proceed with the operation.   DESCRIPTION OF PROCEDURE:  On the day prior to  surgery, skin markers were  placed outlining the dimensions of the TRAM skin paddle.  The patient was  taken to the operating room this morning where she was given general  endotracheal anesthesia,  The abdomen and chest were prepped with Betadine  and draped in sterile drapes. An incision was made in the previous  mastectomy incision and the tissue expander was removed.  A 10 mm Blake  drain was placed in the lateral axillary gutter.  In the inferior medial  corner of the mastectomy site, a tunnel was begun, dissecting on top of the  anterior rectus fascia in an inferior direction for a distance of  approximately 10 cm.  A circular incision was made around the umbilicus and  the umbilicus dissected away from the skin paddle leaving a generous amount  of fat around the umbilical stalk to ensure vascular integrity.  The upper  limb of the skin paddle was  incised and carried down to the anterior  abdominal wall fascia.  The upper abdominal flap was elevated off of the  abdominal wall fascia using electrocautery and dissected up to the costal  margins bilaterally and the xiphoid in the midline.  This dissection  connected to the previously placed tunnel from the mastectomy site. The  patient temporarily was sat up with the back elevated to ensure that I could  close her abdomen with a tension free closure.  Once I determined this, the  lower skin incision was incised and deepened until reaching the anterior  abdominal wall fascia.  The left corner of the skin paddle was elevated off  the anterior abdominal wall fascia from the lateral to medial direction  until crossing over the midline 1 cm to the right of the linea alba. The  right corner of the skin paddle was elevated off the anterior abdominal wall  fascia until reaching the lateral row of the rectus perforators which was  approximately 3 cm medial to the lateral rectus border.  This left a  generous connection to the anterior rectus  fascia on the right.  Two  parallel incisions were made in the anterior rectus fascia approximately 2  cm apart beginning at the costal martins and carrying the two incisions  inferiorly until meeting the skin paddle.  The medial incision continued  inferior skirting along the medial connection of the skin paddle to the  rectus fascia, then turning laterally at the inferior connection to the skin  paddle.  The lateral rectus fascia incision continued laterally opening the  rectus fascia skirting along the lateral connection of the skin paddle until  connecting to the inferior opening in the rectus fascia.  The medial and  lateral edges of the rectus fascia were dissected off the medial and lateral  rectus muscle  using bipolar dissection with great care to preserve the  tendinous inscription vascular architecture.  The deep surface of the rectus  muscle was dissected out of its anatomic bed off of the posterior rectus  fascia using bipolar dissection.  The deep inferior epigastric vessels were  visualized, dissected out of their anatomic bed and clipped individually  with hemoclips.  There was one artery and two veins which were clipped.  The  inferior portion of the rectus muscle just below the arcuate line was  divided using electrocautery.  I opted to remove the lateral 15 cm of zone 4  on the left side before transferring the skin paddle and muscle to the  mastectomy site.  There was decent bleeding at the cut edge.  The entire  flap was now tunneled up to the mastectomy site, placed in the pocket and  the skin edges were temporarily stapled to warm the flap.  The abdominal  wound was copiously irrigated with saline irrigation and hemostasis was  meticulously accomplished.  The anterior rectus fascia was now closed using  multiple interrupted buried figure-of-eight 0 Prolene sutures.  [place] Marlex  graft was placed over the entire closure of the rectus fascia cutting it in the  rectangular fashion and extending it approximately 4 cm on either side  of the rectus closure.  This fascia was fixed in position using a running 2-  0 Prolene around the periphery of the mesh.  An opening was made in the  midportion of the mesh and the umbilicus was brought through this new  opening.  Two Blake drains were placed in the abdominal wound and brought  out through separate stab incisions.  The wound was again copiously  irrigated with saline and inspected for hemostasis.  The patient was placed  in a semi-Fowler's position with the back elevated to approximately 30  degrees and the knees flexed.  The abdominal wound was closed by  approximating the midline of the incision with interrupted 2-0 Vicryl  sutures.  An opening was made just to the right of midline for the umbilicus  and the umbilicus was brought through this new opening and fixed in position  using multiple interrupted 3-0 Vicryl sutures for the dermis.  The umbilicus  was completely viable with excellent capillary refill.  The remainder of the  abdominal wound was closed by approximating the dermis with multiple  interrupted 2-0 Vicryl sutures followed by running 3-0 Vicryl subcuticular.  Attention was redirected to the TRAM flap. The incision was opened by  removal of the staples and the TRAM was removed.  The muscle had great color  and there was good capillary refill to the flap.  I opted to remove most of  the left side of the skin paddle up to the midline skin incision.  This gave  me excellent bleeding at the cut edge.  I also removed a small corner of the  right portion of the skin paddle and there was excellent bleeding at that  portion, too.  The entire skin paddle was de-epithelialized.  The flap was  replaced in the space created by the tissue expander.  The de-epithelialized  dermis was the anterior portion of the flap underneath the pectoralis  muscle.  Prior to closing, I closed off the inferior crease  of the breast by  reapproximating the capsule back down to the chest wall to give a distinct  crease medially.  The flap appeared to have very good circulation with good  capillary refill.  The rectus muscle now was reapproximated using multiple  interrupted 3-0 Vicryl sutures.  The dermis of the skin incision was closed  with the same suture.  A running 3-0 Vicryl subcuticular was placed to unite  the skin edges.  Steri-Strips were applied.  The patient's abdomen and chest  were cleansed of all Betadine and dried blood.  Dry and light dressings were  placed over the breast and the abdomen.  The patient was now awakened,  extubated and transported to the recovery room in satisfactory condition.                                                Alfredia Ferguson, M.D.    WBB/MEDQ  D:  01/04/2003  T:  01/04/2003  Job:  [id number]

## 2010-09-13 NOTE — Op Note (Signed)
NAMELEALA, BRYAND                ACCOUNT NO.:  [id number]   MEDICAL RECORD NO.:  [id number]          PATIENT TYPE:  AMB   LOCATION:  [name]                           FACILITY:  [facility]   PHYSICIAN:  R. Roetta Sessions, M.D. DATE OF BIRTH:  11/12/1940   DATE OF PROCEDURE:  01/18/2004  DATE OF DISCHARGE:                                 OPERATIVE REPORT   PROCEDURE:  Diagnostic colonoscopy.   INDICATIONS FOR PROCEDURE:  The patient is a 70 year old lady with  intermittent hematochezia.  Colonoscopy is now being done.  This approach  has been discussed with the patient at length.  The potential risks,  benefits, and alternatives have been reviewed and questions answered.  She  is agreeable.  Please see my January 15, 2004 consultation note for more  information.   PROCEDURE:  O2 saturation, blood pressure, pulses, and respirations were  monitored throughout the entirety of the procedure.  Conscious sedation was  with Versed 4 mg IV, Demerol 75 mg IV in incremental doses.  The instrument  used was the Olympus video chip system.   FINDINGS:  Digital rectal examination revealed no abnormalities.   ENDOSCOPIC FINDINGS:  The prep was adequate.   Rectum:  Examination of the rectal mucosa including retroflex view of the  anal verge and en face view of the anal canal demonstrated only internal  hemorrhoids.   Colon:  The colonic mucosa was surveyed from the rectosigmoid junction  through the left, transverse, right colon to the area of the appendiceal  orifice, ileocecal valve, and cecum.  These structures were well-seen and  photographed for the record.  From this level, the scope was slowly  withdrawn.  All previously mentioned mucosal surfaces were again seen.  The  patient was noted to have left-sided diverticula.  The remainder of the  colonic mucosa appeared normal.  The patient tolerated the procedure well  and was reactive in endoscopy.   IMPRESSION:  1.  Internal hemorrhoids.   Otherwise normal rectum.  2.  Sigmoid diverticula.  The remainder of the colonic mucosa appeared      normal.   RECOMMENDATIONS:  1.  Diverticulosis/hemorrhoid literature provided to Ms. Delford Field.  2.  Begin daily supplement in the way of Metamucil, Citrucel, or Benefiber.  3.  A 10-day course of Anusol-HC suppositories, one per rectum at bedtime.  4.  I recommend that the patient have another colonoscopy in 5 to 10 years      given her personal history of breast cancer.  5.  She is to let me know if rectal bleeding does not cease.      RMR/MEDQ  D:  01/18/2004  T:  01/18/2004  Job:  [id number]   cc:   Angus G. Renard Matter, M.D.  [address]  [place]  [place] [address]  Fax: [phone]

## 2010-09-13 NOTE — Consult Note (Signed)
NAME:  Zaray, Gatchel                     ACCOUNT NO.:  [id number]   MEDICAL RECORD NO.:  [id number]                    PATIENT TYPE:   LOCATION:                                       FACILITY:   PHYSICIAN:  R. Roetta Sessions, M.D.              DATE OF BIRTH:  June 05, 1940   DATE OF CONSULTATION:  01/15/2004  DATE OF DISCHARGE:                                   CONSULTATION   REASON FOR CONSULTATION:  Hematochezia.   HISTORY OF PRESENT ILLNESS:  Ms. Jillienne Egner is a 70 year old lady with a 1  month history of intermittent blood per rectum, sometimes mixed with stool,  sometimes not. Has not had any associated with abdominal pain although she  has had some vague left lower quadrant abdominal pain chronically for some  months. Has not had any upper GI tract symptoms such as nausea, vomiting,  odynophagia, dysphagia, or reflux symptoms. Having a problem in the past but  not recently. She underwent a colonoscopy with Dr. Kinnie Scales in 2001, and a  hyperplastic polyp was removed from her left colon. Since being seen here  previously, she does carry the diagnosis of breast cancer. She is status  post right mastectomy. She has been followed by Dr. Mariel Sleet. Apparently,  she had limited stage disease. She had some right lower quadrant pain  previously for which she was ultimately found to have an ovarian cyst as a  culprit. She was seen by Dr. Emelda Fear.   PAST MEDICAL HISTORY:  As outlined above. She is status post hysterectomy,  bladder tacking, rectocele repair previously. Normal EGD back in 2001.   CURRENT MEDICATIONS:  _____________ once daily.   ALLERGIES:  PENICILLIN with tongue swelling and rash.   FAMILY HISTORY:  Both mother and father have coronary disease and both have  a history of MI. Otherwise no history of chronic GI or liver illness.   SOCIAL HISTORY:  The patient has been married for 44 years. Four sons. She  is self-employed in a TEFL teacher. Stopped smoking back in  2001. No  alcohol.   REVIEW OF SYSTEMS:  No chest pain. No dyspnea. No fever or chills. She is  actually 7 pounds heavier than she was when she was seen here in 2001.   PHYSICAL EXAMINATION:  GENERAL:  Reveals a pleasant 70 year old lady resting  comfortably. Weight 171, BP 150/110, height 5 foot 5 inches, temperature  97.1, pulse 60.  SKIN:  Warm and dry.  CHEST:  Lungs are clear to auscultation.  HEART:  Regular rate and rhythm without murmurs, gallops, or rubs.  BREASTS:  Deferred.  ABDOMEN:  Nondistended. Positive bowel sounds. Soft, nontender without  appreciable mass or organomegaly.  RECTAL:  Deferred until the time of colonoscopy.   IMPRESSION:  Ms. Janeene Sand is a 70 year old lady who has intermittent  hematochezia. She needs to have a colonoscopy. It is good to know she had a  colonoscopy in  2001 without significant findings. To this end, I have  offered Ms. Breklyn Fabrizio a colonoscopy in the very near future. Potential  risks, benefits, and alternatives have been reviewed. Questions answered.  She is agreeable. Will plan for colonoscopy in the very near future. Will  make further recommendations at that time.                                                Jonathon Bellows, M.D.    RMR/MEDQ  D:  01/15/2004  T:  01/15/2004  Job:  [id number]   cc:   Angus G. Renard Matter, M.D.  [address]  [place]  [place] [address]  Fax: [phone]

## 2010-09-13 NOTE — Assessment & Plan Note (Signed)
[facility]                          [facility] OFFICE NOTE   NAME:Ortman, JEARLENE BRIDWELL                       MRN:          [id number]  DATE:08/12/2006                            DOB:          1941/04/07    PRIMARY CARE PHYSICIAN:  Dr. Renard Matter.   CARDIOLOGIST:  Dr. Lewayne Bunting.   HISTORY OF PRESENT ILLNESS:  [name] is a 70 year old female patient  whom we have followed quiet closely over the last several months for  dyspnea.  She has undergone extensive evaluation with cardiac  catheterization that showed nonobstructive disease with a 40% lesion in  the left circumflex.  She has normal LV function, both by cardiac  catheterization as well as echocardiogram.  She did have mild LVH on her  echocardiogram.  She had no significant valvular abnormalities.  She has  also been seen by her pulmonologist.  Apparently, her pulmonary function  tests are fairly unrevealing.  She has also seen an allergist.  When she  was seen last here on March 25, she was set up for a CPX test.  She has  since seen her allergist.  She has been told that she is allergic to  multiple things.  This includes many things that she comes into contact  with on a daily basis.  She does note to me that, since she was started  on the Advair, her breathing has improved.  She also notes that, since  she was started on the Norvasc for her blood pressure, her breathing has  improved.  Overall, she still gets short of breath with exertion.  She  walks on a daily basis.  She states she still gets short of breath with  that.  She denies any chest discomfort.  Denies any syncope.  Denies any  true orthopnea or paroxysmal nocturnal dyspnea.  She does have a little  bit of edema in her feet most likely secondary to the Norvasc.  She  denies cough or wheezing.  She does note to me that she had breast  surgery some years ago for cancer.  She had a mastectomy.  She has had a  few reconstructive  surgeries since then.  Her last 1 was about 2 years  ago.  She did start to note some shortness of breath after that surgery.   CURRENT MEDICATIONS:  1. Advair 500/50 b.i.d.  2. Aromasin daily.  3. Prilosec over-the-counter 20 mg daily.  4. Aspirin 81 mg daily.  5. Norvasc 5 mg a day.  6. ProAir p.r.n.  7. Nitroglycerin p.r.n.   ALLERGIES:  PENICILLIN.   PHYSICAL EXAM:  She is a well-developed, well-nourished female in no  acute distress.  Blood pressure 148/83, pulse 67, weight 172 pounds.  Repeat blood  pressure by me is 156/80 on the left.  HEENT:  Normocephalic, atraumatic.  PERRLA.  EOMI.  Sclerae clear.  NECK:  Without JVD.  CARDIAC:  Normal S1, S2.  Regular rate and rhythm without murmurs.  LUNGS:  Clear to auscultation bilaterally without wheeze, rales, or  rhonchi.  ABDOMEN:  Soft and nontender.  EXTREMITIES:  Without edema.  Calves are soft and nontender.  SKIN:  Warm and dry.  NEUROLOGIC:  She is alert and oriented x3.  Cranial nerves 2-12 are  intact.   ELECTROCARDIOGRAM:  Reveals sinus bradycardia with a heart rate of 56,  normal axis.  Poor R wave progression.  T wave inversions in V1 through  V3 - question lead placement.   CPX TEST:  This was read today by Dr. Gala Romney.  He noted that the  patient had lots of dyspnea throughout her test.  Her resting blood  pressure is 146/67, peak exercise blood pressure was 262/44.  Her  functional capacity was normal.  This was actually better than normal  because she stopped early due to blood pressure.  She had a marked  hypertensive response, suggestive of diastolic dysfunction.  Her peak  VO2 was 19.5, which is 103% of her age-predicted value.   IMPRESSION:  1. Dyspnea on exertion.  2. 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      a.     A 40% left circumflex lesion.  3. Good left ventricular function.  4. Hypertension, uncontrolled.  5. Questionable asthma versus  reactive airway disease.  6. Gastroesophageal reflux disease.  7. History of breast cancer, status post mastectomy and reconstructive      surgery.   PLAN:  The patient presents to the office today for followup on her  dyspnea.  This seems as though it could be somewhat multifactorial.  However, given the marked blood pressure response on her CPX test, it is  likely, mainly, secondary to hypertensive heart disease.  We will go  ahead and get a BMET and a BNP level today.  I have recommended that we  go ahead and put her on diovan 80 mg a day.  I chose this instead of an  ACE inhibitor because of the possibility of ACE-induced cough and her  other lung issues.  If her BNP is significantly elevated, we may want to  add a small diuretic to her medical regimen as well.  I think she can go  ahead and proceed with allergy shots, as some of this may be playing a  role as well.  The patient will come back in about 2-weeks time for a  blood pressure check with the nurse.  We will have her  undergo a repeat BMET and will need to follow up on her renal function  and potassium.  She will follow up with Dr. Andee Lineman in 6 to 8 weeks to  revisit her symptoms and her blood pressure.      Tereso Newcomer, PA-C  Electronically Signed      Learta Codding, MD,FACC  Electronically Signed   SW/MedQ  DD: 08/12/2006  DT: 08/12/2006  Job #: [id number]   cc:   Angus G. Renard Matter, MD

## 2010-09-13 NOTE — Procedures (Signed)
NAMETIHANNA, GOODSON                ACCOUNT NO.:  [id number]   MEDICAL RECORD NO.:  [id number]            PATIENT TYPE:   LOCATION:                                 FACILITY:   PHYSICIAN:  Edward L. Juanetta Gosling, M.D.     DATE OF BIRTH:   DATE OF PROCEDURE:  DATE OF DISCHARGE:                              PULMONARY FUNCTION TEST   1.  Spirometry shows a mild ventilatory defect with decreased FEF 25-75,      which may indicate small airway air flow obstruction.  2.  Lung volumes are normal with no evidence of restrictive change.  3.  DLCO is moderately reduced.  4.  Arterial blood gases are normal.  5.  There is no significant bronchodilator effect.     [place]   ELH/MEDQ  D:  03/18/2004  T:  03/19/2004  Job:  [id number]   cc:   Ladona Horns. Neijstrom, MD  [address]  [place]  [place] [address]  Fax: [phone]

## 2010-09-13 NOTE — Procedures (Signed)
   NAME:  [name], [name]                          ACCOUNT NO.:  [id number]   MEDICAL RECORD NO.:  [id number]                   PATIENT TYPE:   LOCATION:                                       FACILITY:  [facility]   PHYSICIAN:  [place] Bing, M.D.               DATE OF BIRTH:  March 31, 1941   DATE OF PROCEDURE:  10/28/2002  DATE OF DISCHARGE:                                  ECHOCARDIOGRAM   CLINICAL INFORMATION:  The patient is a 70 year old woman with breast cancer  presenting with increasing dyspnea.   PREVIOUS STUDY:  @@   M-MODE:  AORTA:  2.5  (<4.0)  LEFT ATRIUM:  3.5  (<4.0)  SEPTUM:  0.8  (0.7-1.1)  POSTERIOR WALL:  0.9  (0.7-1.1)  LV-DIASTOLE:  4.3  (<5.7)  LV-SYSTOLE:  2.8  (<4.0)  E-SEPTAL:  @@  (<0.5)  RV-DIASTOLE:  @@  (<2.5)  IVC:  @@  (<2.0)   1. Technically adequate echocardiographic study.  2. Normal left and right atrial size.  Normal right ventricular size and     function.  Borderline right ventricular hypertrophy.  3. Normal aorta, mitral, tricuspid and pulmonic valves.  4. Normal internal dimension of the left ventricle; left ventricular wall     thickness at the upper limit of normal.  Normal regional and global left     ventricular systolic function.  5. Normal inferior vena cava.                                               [place] Bing, M.D.    RR/MEDQ  D:  10/28/2002  T:  10/30/2002  Job:  [id number]

## 2010-09-13 NOTE — Discharge Summary (Signed)
NAME:  [name], [name]                          ACCOUNT NO.:  [id number]   MEDICAL RECORD NO.:  [id number]                   PATIENT TYPE:  INP   LOCATION:  A340                                 FACILITY:  [facility]   PHYSICIAN:  Barbaraann Barthel, M.D.              DATE OF BIRTH:  December 14, 1940   DATE OF ADMISSION:  07/19/2002  DATE OF DISCHARGE:  07/21/2002                                 DISCHARGE SUMMARY   DIAGNOSIS:  Adenocarcinoma of the right breast.   PROCEDURE:  On July 19, 2002 a right modified radical mastectomy.   HISTORY OF PRESENT ILLNESS:  This is a 70 year old white female who was  followed by the OB/GYN service with serial mammography and was noted to have  a palpable mass in her right breast. She was referred to surgery to rule out  carcinoma and after mammogram also was suspicious for carcinoma of the  breast. She was taken to surgery via the outpatient department where we  discussed surgical options and because of the centrally located aspect of  the breast and the small size of her breast, we opted for a modified radical  mastectomy for adequate local control when a previous biopsy site revealed  positive margins. We have discussed this in detail with the patient  preoperatively. She also received preoperative consultations with the  Oncology Department.   HOSPITAL COURSE:  The surgery took place uneventfully on July 19, 2002.  Postoperatively the patient did well. She had some incisional discomfort and  some postoperative nausea. However, this subsided as did her Jackson-Pratt  drainage, so her drain was removed prior to discharge. Pathology revealed no  residual invasive malignancy and she had a single focus of atypical ductal  hyperplasia. The margins were clean and the biopsy site was well contained  within the specimen. Examination of nine axillary nodes were all negative  for metastasis. As stated, postoperatively apart from some incisional pain,  the patient  did quite well. She had no seroma or problems with range of  motion  of her right shoulder. She did quite well from the surgery and was  discharged on the second postoperative day.   LABORATORY DATA:  On July 20, 2002 white count was 6.1 with an hemoglobin  and hematocrit of 10.9 and 32.5. Her metabolic panel was all grossly within  normal limits. The rest of lab work for metastatic workup will follow in the  outpatient arena.   DISCHARGE INSTRUCTIONS:  The patient is excused from work. She is told to  increase her activities as tolerated. She can shower and go up and down the  stairs. She is restricted from doing any driving, heavy lifting or any  sexual activity at present. She is told to clean her wound with alcohol  three times a day. She is told to refrain from any aspirin products for the  first week or so after surgery. She is  to resume her preoperative  medications and  she is given Darvocet N-100 one tab every four hours as needed pain and  Ativan 1 mg by mouth at bedtime as needed for anxiety. We will follow-up  with her perioperatively and she will also have arrangements made to follow-  up with the oncology clinic.                                               Barbaraann Barthel, M.D.    WB/MEDQ  D:  07/29/2002  T:  07/30/2002  Job:  [id number]   cc:   Tilda Burrow, M.D.  [address] [place]  [place] [address]  Fax: [phone]   Ladona Horns. Neijstrom, MD  [address]  [place]  [place] [address]  Fax: [phone]

## 2010-10-23 ENCOUNTER — Ambulatory Visit (HOSPITAL_COMMUNITY): Payer: Self-pay | Admitting: Oncology

## 2010-10-23 ENCOUNTER — Encounter (HOSPITAL_COMMUNITY): Payer: MEDICARE | Admitting: Oncology

## 2010-11-11 ENCOUNTER — Ambulatory Visit (HOSPITAL_COMMUNITY)
Admission: RE | Admit: 2010-11-11 | Discharge: 2010-11-11 | Disposition: A | Discharge status: Home / Self Care | Payer: Medicare Other | Source: Ambulatory Visit | Attending: Oncology | Admitting: Oncology

## 2010-11-11 DIAGNOSIS — Z1231 Encounter for screening mammogram for malignant neoplasm of breast: Secondary | ICD-10-CM | POA: Insufficient documentation

## 2010-11-11 DIAGNOSIS — Z139 Encounter for screening, unspecified: Secondary | ICD-10-CM

## 2010-12-11 ENCOUNTER — Encounter (HOSPITAL_COMMUNITY): Payer: Medicare Other | Admitting: Oncology

## 2011-01-16 LAB — COMPREHENSIVE METABOLIC PANEL
ALT: 28
AST: 31
Albumin: 3.8
Alkaline Phosphatase: 63
BUN: 30 — ABNORMAL HIGH
CO2: 25
Calcium: 9.6
Chloride: 107
Creatinine, Ser: 1.04
GFR calc Af Amer: >60
GFR calc non Af Amer: 53 — ABNORMAL LOW
Glucose, Bld: 122 — ABNORMAL HIGH
Potassium: 3.6
Sodium: 136
Total Bilirubin: 0.3
Total Protein: 6.8

## 2011-01-16 LAB — DIFFERENTIAL
Basophils Absolute: 0.1
Basophils Relative: 1
Eosinophils Absolute: 0.3
Eosinophils Relative: 4
Lymphocytes Relative: 50 — ABNORMAL HIGH
Lymphs Abs: 4.4 — ABNORMAL HIGH
Monocytes Absolute: 0.6
Monocytes Relative: 7
Neutro Abs: 3.3
Neutrophils Relative %: 38 — ABNORMAL LOW

## 2011-01-16 LAB — CBC
HCT: 31.2 — ABNORMAL LOW
Hemoglobin: 10.7 — ABNORMAL LOW
MCHC: 34.2
MCV: 91.4
Platelets: 278
RBC: 3.42 — ABNORMAL LOW
RDW: 13
WBC: 8.7

## 2011-01-17 LAB — KOH PREP: KOH Prep: NONE SEEN

## 2011-01-18 ENCOUNTER — Emergency Department (HOSPITAL_COMMUNITY)
Admission: EM | Admit: 2011-01-18 | Discharge: 2011-01-18 | Disposition: A | Discharge status: Home / Self Care | Payer: Medicare Other | Attending: Emergency Medicine | Admitting: Emergency Medicine

## 2011-01-18 DIAGNOSIS — R220 Localized swelling, mass and lump, head: Secondary | ICD-10-CM | POA: Insufficient documentation

## 2011-01-18 NOTE — ED Provider Notes (Signed)
History     CSN: [id number] Arrival date & time: 01/18/2011  1:11 PM  Chief Complaint  Patient presents with  . Allergic Reaction    HPI  (Consider location/radiation/quality/duration/timing/severity/associated sxs/prior treatment)  HPI Comments: Pt had plastic surgical "eye lift" 4 days ago  Has developed red  Patient is a 70 y.o. female presenting with allergic reaction. The history is provided by the patient. No language interpreter was used.  Allergic Reaction The primary symptoms do not include shortness of breath, cough, nausea, vomiting, diarrhea, dizziness, altered mental status, rash, angioedema or urticaria. The problem has been gradually worsening.    Past Medical History  Diagnosis Date  . Cancer   . Hypertension     Past Surgical History  Procedure Date  . Cosmetic surgery   . Abdominal hysterectomy   . Breast surgery   . Cholecystectomy     History reviewed. No pertinent family history.  History  Substance Use Topics  . Smoking status: Never Smoker   . Smokeless tobacco: Not on file  . Alcohol Use: No    OB History    Grav Para Term Preterm Abortions TAB SAB Ect Mult Living                  Review of Systems  Review of Systems  Constitutional: Negative for fever.  Respiratory: Negative for cough, choking, chest tightness, shortness of breath and stridor.   Gastrointestinal: Negative for nausea, vomiting and diarrhea.  Skin: Negative for rash.  Neurological: Negative for dizziness.  Psychiatric/Behavioral: Negative for altered mental status.  All other systems reviewed and are negative.    Allergies  Aspirin and Penicillins  Home Medications   Current Outpatient Rx  Name Route Sig Dispense Refill  . VICODIN PO Oral Take 1 tablet by mouth at bedtime. Unknown strentgh     . DIOVAN PO Oral Take 1 tablet by mouth daily. Patient states 'Diovan' unable to confirm dose or if it is Diovan HCT       Physical Exam    BP 156/61  Pulse 72   Temp(Src) 97.8 F (36.6 C) (Oral)  Resp 18  Ht 5\' 6"  (1.676 m)  Wt 170 lb (77.111 kg)  BMI 27.44 kg/m2  SpO2 97%  Physical Exam  Nursing note and vitals reviewed. Constitutional: She is oriented to person, place, and time. She appears well-developed and well-nourished. No distress.  HENT:  Head: Normocephalic and atraumatic.    Nose: Nose normal.  Mouth/Throat: Oropharynx is clear and moist.  Eyes: Conjunctivae and EOM are normal. Pupils are equal, round, and reactive to light.  Neck: Normal range of motion.  Cardiovascular: Normal rate, regular rhythm and normal heart sounds.   Pulmonary/Chest: Effort normal and breath sounds normal.  Abdominal: Soft. She exhibits no distension. There is no tenderness.  Musculoskeletal: Normal range of motion.  Lymphadenopathy:       Head (right side): No submental, no submandibular, no tonsillar, no preauricular, no posterior auricular and no occipital adenopathy present.       Head (left side): No submental, no submandibular, no tonsillar, no preauricular, no posterior auricular and no occipital adenopathy present.    She has no cervical adenopathy.    She has no axillary adenopathy.       Right: No inguinal, no supraclavicular and no epitrochlear adenopathy present.       Left: No inguinal, no supraclavicular and no epitrochlear adenopathy present.  Neurological: She is alert and oriented to person, place, and time.  Skin: Skin is warm and dry. She is not diaphoretic.  Psychiatric: She has a normal mood and affect. Judgment normal.    ED Course  Procedures (including critical care time)  Labs Reviewed - No data to display No results found.   No diagnosis found.   MDM        Worthy Rancher, PA 01/18/11 1500

## 2011-01-18 NOTE — ED Provider Notes (Signed)
Medical screening examination/treatment/procedure(s) were performed by non-physician practitioner and as supervising physician I was immediately available for consultation/collaboration.  Glynn Octave, MD 01/18/11 2130

## 2011-01-18 NOTE — ED Notes (Signed)
Pt presents with swelling to face and eyes after having eyebrow lift on Tuesday. Pt states Wednesday redness and swelling occurred and has progressed to cheeks and jaw. Pt concerned about breathing. Pt started Prednisone yesterday and also took 1 Benadryl yesterday with no improvement.

## 2011-01-20 LAB — CBC
HCT: 32.9 — ABNORMAL LOW
Hemoglobin: 11.8 — ABNORMAL LOW
MCHC: 35.8
MCV: 90
Platelets: 320
RBC: 3.66 — ABNORMAL LOW
RDW: 13
WBC: 7.9

## 2011-02-11 LAB — BASIC METABOLIC PANEL
BUN: 22
CO2: 25
Calcium: 9.4
Chloride: 101
Creatinine, Ser: 1.51 — ABNORMAL HIGH
GFR calc Af Amer: 42 — ABNORMAL LOW
GFR calc non Af Amer: 34 — ABNORMAL LOW
Glucose, Bld: 114 — ABNORMAL HIGH
Potassium: 3.8
Sodium: 134 — ABNORMAL LOW

## 2011-02-11 LAB — DIFFERENTIAL
Basophils Absolute: 0
Basophils Relative: 0
Eosinophils Absolute: 0.1
Eosinophils Relative: 2
Lymphocytes Relative: 34
Lymphs Abs: 2.5
Monocytes Absolute: 0.8 — ABNORMAL HIGH
Monocytes Relative: 10
Neutro Abs: 3.9
Neutrophils Relative %: 53

## 2011-02-11 LAB — CBC
HCT: 34.4 — ABNORMAL LOW
Hemoglobin: 11.8 — ABNORMAL LOW
MCHC: 34.3
MCV: 91.6
Platelets: 310
RBC: 3.76 — ABNORMAL LOW
RDW: 12.7
WBC: 7.3

## 2011-02-11 LAB — CANCER ANTIGEN 27.29: CA 27.29: 33

## 2011-03-21 ENCOUNTER — Other Ambulatory Visit (HOSPITAL_COMMUNITY): Payer: Self-pay | Admitting: Family Medicine

## 2011-03-21 ENCOUNTER — Ambulatory Visit (HOSPITAL_COMMUNITY)
Admission: RE | Admit: 2011-03-21 | Discharge: 2011-03-21 | Disposition: A | Discharge status: Home / Self Care | Payer: Medicare Other | Source: Ambulatory Visit | Attending: Family Medicine | Admitting: Family Medicine

## 2011-03-21 DIAGNOSIS — R52 Pain, unspecified: Secondary | ICD-10-CM

## 2011-03-21 DIAGNOSIS — M79609 Pain in unspecified limb: Secondary | ICD-10-CM | POA: Insufficient documentation

## 2011-03-21 DIAGNOSIS — R079 Chest pain, unspecified: Secondary | ICD-10-CM | POA: Insufficient documentation

## 2011-04-28 ENCOUNTER — Telehealth: Payer: Self-pay | Admitting: *Deleted

## 2011-04-28 NOTE — Telephone Encounter (Signed)
Patient left message on voicemail feeling hot & sweaty a lot.  Returned call to patient - states she is having spells of feeling really hot, breaking out in sweats.  Does have some pain in left arm down to bend of elbow, sitnging.  Denies any chest pain.  States SOB is worse with exertion.  No diabetes.  Does not have NTG.  States we have not seen her in about 4-5 years.  Patient states that she did go ahead a & schedule visit for Feb 2013.  Advised to go to ED or urgent care if symptoms worsen.  Unable to give any advice due to length of time since being seen last.  Also, advised to contact PMD Megan Mans) on Wednesday (office closed tomorrow, 04/29/11) for appointment.  If PMD feels she needs to be seen sooner than Feb, he will need to make the request.  She verbalized understanding.

## 2011-06-06 ENCOUNTER — Ambulatory Visit: Payer: Medicare Other | Admitting: Cardiology

## 2011-10-07 ENCOUNTER — Other Ambulatory Visit (HOSPITAL_COMMUNITY): Payer: Self-pay | Admitting: Family Medicine

## 2011-10-07 DIAGNOSIS — Z139 Encounter for screening, unspecified: Secondary | ICD-10-CM

## 2011-11-13 ENCOUNTER — Ambulatory Visit (HOSPITAL_COMMUNITY)
Admission: RE | Admit: 2011-11-13 | Discharge: 2011-11-13 | Disposition: A | Discharge status: Home / Self Care | Payer: Medicare Other | Source: Ambulatory Visit | Attending: Family Medicine | Admitting: Family Medicine

## 2011-11-13 DIAGNOSIS — Z1231 Encounter for screening mammogram for malignant neoplasm of breast: Secondary | ICD-10-CM | POA: Insufficient documentation

## 2011-11-13 DIAGNOSIS — Z139 Encounter for screening, unspecified: Secondary | ICD-10-CM

## 2012-01-09 ENCOUNTER — Encounter: Payer: Self-pay | Admitting: Cardiology

## 2012-01-13 ENCOUNTER — Encounter: Payer: Self-pay | Admitting: *Deleted

## 2012-01-14 ENCOUNTER — Encounter: Payer: Self-pay | Admitting: Cardiology

## 2012-01-14 ENCOUNTER — Ambulatory Visit (INDEPENDENT_AMBULATORY_CARE_PROVIDER_SITE_OTHER): Payer: Medicare Other | Admitting: Cardiology

## 2012-01-14 VITALS — BP 150/74 | HR 72 | Ht 65.5 in | Wt 171.4 lb

## 2012-01-14 DIAGNOSIS — I2510 Atherosclerotic heart disease of native coronary artery without angina pectoris: Secondary | ICD-10-CM

## 2012-01-14 DIAGNOSIS — R0602 Shortness of breath: Secondary | ICD-10-CM

## 2012-01-14 DIAGNOSIS — I119 Hypertensive heart disease without heart failure: Secondary | ICD-10-CM

## 2012-01-14 DIAGNOSIS — J45909 Unspecified asthma, uncomplicated: Secondary | ICD-10-CM

## 2012-01-14 MED ORDER — AMLODIPINE BESYLATE 5 MG PO TABS: 5.0000 mg | ORAL_TABLET | Freq: Every day | ORAL | Status: DC

## 2012-01-14 NOTE — Assessment & Plan Note (Signed)
Nonobstructive at catheterization in 2008 with 40% circumflex.

## 2012-01-14 NOTE — Progress Notes (Signed)
Clinical Summary [name] is a 71 y.o.female referred for cardiology consultation by Dr. Margo Aye. She was previously evaluated by Dr. Andee Lineman in 2008 due to dyspnea on exertion and ultimately felt to have diastolic dysfunction with hypertensive heart disease after cardiac catheterization, PFTs, allergy testing, and a CPX test. She was treated with Advair and Norvasc at that time.  She describes several month history of intermittent feeling of being hot and diaphoretic, occurs with exertion, also spontaneously at rest. She denies definite hot flash sensation, has had hysterectomy and oophorectomy by report. She denies any frank chest pain with these symptoms. States that she has not been on any antihypertensive medication for some time. Also taking no bronchodilators or allergy medications. Has had chronic shortness of breath, although reports it is somewhat worse in the last several months.  ECG today shows sinus rhythm with poor R wave progression. She has had a followup cardiac testing since 2008.   Allergies  Allergen Reactions  . Aspirin Other (See Comments)    Causes hemoptysis  . Penicillins Swelling and Other (See Comments)    blisters    Current Outpatient Prescriptions  Medication Sig Dispense Refill  . DiphenhydrAMINE HCl, Sleep, (UNISOM SLEEPGELS) 50 MG CAPS Take 50 mg by mouth as needed.      Marland Kitchen HYDROcodone-acetaminophen (NORCO) 7.5-325 MG per tablet Take 1 tablet by mouth every 4 (four) hours as needed.      Marland Kitchen amLODipine (NORVASC) 5 MG tablet Take 1 tablet (5 mg total) by mouth daily.  30 tablet  11    Past Medical History  Diagnosis Date  . Breast cancer     Right mastectomy  . Essential hypertension, benign   . Coronary atherosclerosis of native coronary artery     Nonobstructive at catheterization 2008 - 40% circumflex  . GERD (gastroesophageal reflux disease)   . Environmental allergies     Past Surgical History  Procedure Date  . Abdominal hysterectomy   .  Breast surgery   . Cholecystectomy   . Bladder suspension     Family History  Problem Relation Age of Onset  . Coronary artery disease Mother   . Coronary artery disease Father   . Coronary artery disease Brother   . Cancer Sister     Social History [name] reports that she quit smoking about 12 years ago. Her smoking use included Cigarettes. She does not have any smokeless tobacco history on file. [name] reports that she does not drink alcohol.  Review of Systems No palpitations or syncope. Reports stable appetite, no major bleeding problems. No fevers or chills. No cough. No reported wheezing. No peripheral edema. Otherwise negative except as outlined.  Physical Examination Filed Vitals:   01/14/12 1420  BP: 150/74  Pulse: 72   Filed Weights   01/14/12 1420  Weight: 171 lb 6.4 oz (77.747 kg)   Patient in no acute distress. HEENT: Conjunctiva and lids normal, oropharynx clear. Neck: Supple, no elevated JVP or carotid bruits, no thyromegaly. Lungs: Clear to auscultation, nonlabored breathing at rest. Cardiac: Regular rate and rhythm, no S3 or significant systolic murmur, S4 noted, no pericardial rub. Abdomen: Soft, nontender, bowel sounds present, no guarding or rebound. Extremities: No pitting edema, distal pulses 2+. Skin: Warm and dry. Musculoskeletal: No kyphosis. Neuropsychiatric: Alert and oriented x3, affect grossly appropriate.   Problem List and Plan   Shortness of breath Chronic history with extensive workup in the past, outlined above. Not entirely clear whether her feelings of being  hot and sweaty represent any major change in her cardiac condition however. She does report an exertional component at times. Since she had documentation of hypertensive heart disease with diastolic dysfunction in the past, significant blood pressure elevation with activity, recommend resuming antihypertensive therapy. We will add back Norvasc at 5 mg daily for now. Also in  light of nonobstructive CAD documented at previous catheterization, we will pursue an exercise echocardiogram to exclude any progression that might be contributing to symptoms. Follow up is arranged.  Coronary atherosclerosis of native coronary artery Nonobstructive at catheterization in 2008 with 40% circumflex.  ASTHMA Reportedly, PFTs were not particularly concerning based on prior workup. She does have a history of allergies and fared better in the past when she was on Advair. This may need to be reconsidered as a possible source of some of her symptoms as well, and she can continue to follow up with Dr. Margo Aye.  Hypertensive heart disease We are adding Norvasc back to her regimen.    Jonelle Sidle, M.D., F.A.C.C.

## 2012-01-14 NOTE — Patient Instructions (Addendum)
Your physician recommends that you schedule a follow-up appointment in: 3 weeks  Your physician has recommended you make the following change in your medication:  1 - Start Amlodipine 5 mg dialy  Your physician has requested that you have a stress echocardiogram. For further information please visit [email or web address] Please follow instruction sheet as given.

## 2012-01-14 NOTE — Assessment & Plan Note (Signed)
Reportedly, PFTs were not particularly concerning based on prior workup. She does have a history of allergies and fared better in the past when she was on Advair. This may need to be reconsidered as a possible source of some of her symptoms as well, and she can continue to follow up with Dr. Margo Aye.

## 2012-01-14 NOTE — Assessment & Plan Note (Signed)
We are adding Norvasc back to her regimen.

## 2012-01-14 NOTE — Assessment & Plan Note (Signed)
Chronic history with extensive workup in the past, outlined above. Not entirely clear whether her feelings of being hot and sweaty represent any major change in her cardiac condition however. She does report an exertional component at times. Since she had documentation of hypertensive heart disease with diastolic dysfunction in the past, significant blood pressure elevation with activity, recommend resuming antihypertensive therapy. We will add back Norvasc at 5 mg daily for now. Also in light of nonobstructive CAD documented at previous catheterization, we will pursue an exercise echocardiogram to exclude any progression that might be contributing to symptoms. Follow up is arranged.

## 2012-01-27 ENCOUNTER — Ambulatory Visit (HOSPITAL_COMMUNITY)
Admission: RE | Admit: 2012-01-27 | Discharge: 2012-01-27 | Disposition: A | Discharge status: Home / Self Care | Payer: Medicare Other | Source: Ambulatory Visit | Attending: Cardiology | Admitting: Cardiology

## 2012-01-27 ENCOUNTER — Encounter (HOSPITAL_COMMUNITY): Payer: Self-pay | Admitting: Cardiology

## 2012-01-27 ENCOUNTER — Encounter: Payer: Self-pay | Admitting: *Deleted

## 2012-01-27 DIAGNOSIS — R0602 Shortness of breath: Secondary | ICD-10-CM

## 2012-01-27 DIAGNOSIS — R0989 Other specified symptoms and signs involving the circulatory and respiratory systems: Secondary | ICD-10-CM | POA: Insufficient documentation

## 2012-01-27 DIAGNOSIS — R0609 Other forms of dyspnea: Secondary | ICD-10-CM | POA: Insufficient documentation

## 2012-01-27 DIAGNOSIS — I2510 Atherosclerotic heart disease of native coronary artery without angina pectoris: Secondary | ICD-10-CM

## 2012-01-27 DIAGNOSIS — I10 Essential (primary) hypertension: Secondary | ICD-10-CM | POA: Insufficient documentation

## 2012-01-27 DIAGNOSIS — I119 Hypertensive heart disease without heart failure: Secondary | ICD-10-CM

## 2012-01-27 NOTE — Progress Notes (Signed)
Stress Lab Nurses Notes - [name]  [name] 01/27/2012 Reason for doing test: CAD and Dyspnea Type of test: Stress Echo Nurse performing test: Parke Poisson, RN Nuclear Medicine Tech: Not Applicable Echo Tech: Nestor Ramp MD performing test: Ival Bible & Ronie Spies PA Family MD:  Z. Hall Test explained and consent signed: yes IV started: No IV started Symptoms: SOB Treatment/Intervention: None Reason test stopped: reached target HR After recovery IV was: None Patient to return to Nuc. Med at :NA Patient discharged: Home Patient's Condition upon discharge was: stable Comments: During test peak BP 197/63 & HR 122. Recovery BP 138/72 & HR 86.  Symptoms resolved in recovery. Erskine Speed T

## 2012-01-27 NOTE — Progress Notes (Addendum)
  Echocardiogram Stress echocardiogram has been performed.  Antony Salmon, RCS 01/27/2012, 10:38 AM

## 2012-02-10 ENCOUNTER — Ambulatory Visit: Payer: Medicare Other | Admitting: Cardiology

## 2012-02-25 ENCOUNTER — Ambulatory Visit (INDEPENDENT_AMBULATORY_CARE_PROVIDER_SITE_OTHER): Payer: Medicare Other | Admitting: Cardiology

## 2012-02-25 ENCOUNTER — Encounter: Payer: Self-pay | Admitting: Cardiology

## 2012-02-25 VITALS — BP 134/70 | HR 63 | Ht 65.5 in | Wt 167.1 lb

## 2012-02-25 DIAGNOSIS — I119 Hypertensive heart disease without heart failure: Secondary | ICD-10-CM

## 2012-02-25 DIAGNOSIS — I2510 Atherosclerotic heart disease of native coronary artery without angina pectoris: Secondary | ICD-10-CM

## 2012-02-25 NOTE — Assessment & Plan Note (Signed)
Tolerated the addition of Norvasc. She did have exercise induced hypertension over baseline elevated blood pressure. Might need further up titration over time or addition of other agents depending on blood pressure control. Keep follow up with Dr. Margo Aye.

## 2012-02-25 NOTE — Assessment & Plan Note (Signed)
History of nonobstructive disease based on previous assessment, with recent followup exercise echocardiogram demonstrating no definite ischemia to suggest progressive disease over time. Would recommend risk factor modification, blood pressure control, continued exercise, and lipid management as needed. She will follow up with Dr. Margo Aye, and we can see her back as necessary.

## 2012-02-25 NOTE — Patient Instructions (Addendum)
Your physician recommends that you schedule a follow-up appointment in: As needed  

## 2012-02-25 NOTE — Progress Notes (Signed)
   Clinical Summary [name] is a 71 y.o.female presenting for followup. She was seen in September.  Exercise echocardiogram on 10/1 reviewed showing equivocal ST segment changes, resting and stress induced hypertension, but no echocardiographic evidence of ischemia.  She has tolerated the addition of Norvasc, states that she might feel somewhat better, but continues to have episodes of significant sweating. No definite chest pain however. It is not clear that these symptoms are cardiac.  At this point I recommended that she continue her regular walking, focus on better blood pressure control over time to reduce her risk of adverse cardiovascular events, and keep follow up Dr. Margo Aye for assessment of lipids and other health maintenance. She might consider seeing an endocrinologist.   Allergies  Allergen Reactions  . Aspirin Other (See Comments)    Causes hemoptysis  . Penicillins Swelling and Other (See Comments)    blisters    Current Outpatient Prescriptions  Medication Sig Dispense Refill  . amLODipine (NORVASC) 5 MG tablet Take 1 tablet (5 mg total) by mouth daily.  30 tablet  11  . DiphenhydrAMINE HCl, Sleep, (UNISOM SLEEPGELS) 50 MG CAPS Take 50 mg by mouth as needed.      Marland Kitchen HYDROcodone-acetaminophen (NORCO) 7.5-325 MG per tablet Take 1 tablet by mouth every 4 (four) hours as needed.        Past Medical History  Diagnosis Date  . Breast cancer     Right mastectomy  . Essential hypertension, benign   . Coronary atherosclerosis of native coronary artery     Nonobstructive at catheterization 2008 - 40% circumflex  . GERD (gastroesophageal reflux disease)   . Environmental allergies     Social History Ms. Claflin reports that she quit smoking about 12 years ago. Her smoking use included Cigarettes. She does not have any smokeless tobacco history on file. Ms. Wormley reports that she does not drink alcohol.  Review of Systems No palpitations or syncope. Stable appetite. No  reported bleeding episodes. No fevers. Otherwise negative.  Physical Examination Filed Vitals:   02/25/12 0852  BP: 134/70  Pulse: 63   Filed Weights   02/25/12 0852  Weight: 167 lb 1.9 oz (75.805 kg)   Patient in no acute distress.  HEENT: Conjunctiva and lids normal, oropharynx clear.  Neck: Supple, no elevated JVP or carotid bruits, no thyromegaly.  Lungs: Clear to auscultation, nonlabored breathing at rest.  Cardiac: Regular rate and rhythm, no S3 or significant systolic murmur, S4 noted, no pericardial rub.  Abdomen: Soft, nontender, bowel sounds present, no guarding or rebound.  Extremities: No pitting edema, distal pulses 2+.    Problem List and Plan   Coronary atherosclerosis of native coronary artery History of nonobstructive disease based on previous assessment, with recent followup exercise echocardiogram demonstrating no definite ischemia to suggest progressive disease over time. Would recommend risk factor modification, blood pressure control, continued exercise, and lipid management as needed. She will follow up with Dr. Margo Aye, and we can see her back as necessary.  Hypertensive heart disease Tolerated the addition of Norvasc. She did have exercise induced hypertension over baseline elevated blood pressure. Might need further up titration over time or addition of other agents depending on blood pressure control. Keep follow up with Dr. Margo Aye.    Jonelle Sidle, M.D., F.A.C.C.

## 2012-04-28 HISTORY — PX: FOOT SURGERY: SHX648

## 2012-11-23 ENCOUNTER — Other Ambulatory Visit: Payer: Self-pay | Admitting: Orthopedic Surgery

## 2012-11-23 DIAGNOSIS — M545 Low back pain, unspecified: Secondary | ICD-10-CM

## 2012-11-24 ENCOUNTER — Ambulatory Visit
Admission: RE | Admit: 2012-11-24 | Discharge: 2012-11-24 | Disposition: A | Discharge status: Home / Self Care | Payer: Medicare Other | Source: Ambulatory Visit | Attending: Orthopedic Surgery | Admitting: Orthopedic Surgery

## 2012-11-24 DIAGNOSIS — M545 Low back pain, unspecified: Secondary | ICD-10-CM

## 2012-12-03 ENCOUNTER — Other Ambulatory Visit (HOSPITAL_COMMUNITY): Payer: Self-pay | Admitting: Internal Medicine

## 2012-12-03 ENCOUNTER — Telehealth: Payer: Self-pay | Admitting: Vascular Surgery

## 2012-12-03 DIAGNOSIS — Z139 Encounter for screening, unspecified: Secondary | ICD-10-CM

## 2012-12-03 NOTE — Telephone Encounter (Signed)
Message copied by Margaretmary Eddy on Fri Dec 03, 2012  3:43 PM ------      Message from: Phillips Odor      Created: Fri Dec 03, 2012  1:29 PM      Regarding: needs consult w/ Dr. Edilia Bo       Please schedule for Consult with [facility] prior to surgery for ALIF on 12/30/12; [facility] scheduled to assist Dr. Yevette Edwards.  Please remind pt. To bring copy of LS spine film to appt.  ------

## 2012-12-03 NOTE — Telephone Encounter (Signed)
Gave patient appt info and address, informed pt to pick up LS Spine films to bring to appt. Pt acknowledged understanding.

## 2012-12-07 ENCOUNTER — Ambulatory Visit (HOSPITAL_COMMUNITY)
Admission: RE | Admit: 2012-12-07 | Discharge: 2012-12-07 | Disposition: A | Discharge status: Home / Self Care | Payer: Medicare Other | Source: Ambulatory Visit | Attending: Internal Medicine | Admitting: Internal Medicine

## 2012-12-07 DIAGNOSIS — Z139 Encounter for screening, unspecified: Secondary | ICD-10-CM

## 2012-12-07 DIAGNOSIS — Z1231 Encounter for screening mammogram for malignant neoplasm of breast: Secondary | ICD-10-CM | POA: Insufficient documentation

## 2012-12-09 ENCOUNTER — Other Ambulatory Visit: Payer: Self-pay | Admitting: Orthopedic Surgery

## 2012-12-14 ENCOUNTER — Encounter: Payer: Self-pay | Admitting: Vascular Surgery

## 2012-12-15 ENCOUNTER — Encounter: Payer: Self-pay | Admitting: Vascular Surgery

## 2012-12-15 ENCOUNTER — Ambulatory Visit (INDEPENDENT_AMBULATORY_CARE_PROVIDER_SITE_OTHER): Payer: Medicare Other | Admitting: Vascular Surgery

## 2012-12-15 VITALS — BP 168/66 | HR 76 | Resp 16 | Ht 65.5 in | Wt 171.6 lb

## 2012-12-15 DIAGNOSIS — IMO0002 Reserved for concepts with insufficient information to code with codable children: Secondary | ICD-10-CM | POA: Insufficient documentation

## 2012-12-15 NOTE — Progress Notes (Signed)
[facility]  Patient name: [name] MRN: [id number] DOB: 1940/11/11 Sex: female  REASON FOR CONSULT: evaluate for anterior retroperitoneal exposure of L4-L5. First by Dr. Yevette Edwards.  HPI: [name] is a 72 y.o. female who has a long history of low back pain. She's had previous surgery and her back approximately 3 years ago. She has pain in the lower back which is exacerbated by any activity with no real alleviating factors. She has attempted physical therapy and injection therapy with no relief. She's being considered for anterior lumbar interbody fusion and we were asked to provide anterior rectoperineal exposure.  She denies any history of claudication, rest pain, or nonhealing ulcers in her feet.  Her risk factors for peripheral vascular disease including history of tobacco use in the past. She quit approximately 12-13 years ago. In addition she has benign essential hypertension. She denies any history of hypercholesterolemia, diabetes, history cardiac disease.  Past Medical History  Diagnosis Date  . Breast cancer     Right mastectomy  . Essential hypertension, benign   . Coronary atherosclerosis of native coronary artery     Nonobstructive at catheterization 2008 - 40% circumflex  . GERD (gastroesophageal reflux disease)   . Environmental allergies    Family History  Problem Relation Age of Onset  . Coronary artery disease Mother   . Coronary artery disease Father   . Coronary artery disease Brother   . Cancer Sister    SOCIAL HISTORY: History  Substance Use Topics  . Smoking status: Former Smoker    Types: Cigarettes    Quit date: 04/29/1999  . Smokeless tobacco: Not on file  . Alcohol Use: No   Allergies  Allergen Reactions  . Aspirin Other (See Comments)    Causes hemoptysis  . Penicillins Swelling and Other (See Comments)    blisters   Current Outpatient Prescriptions  Medication Sig Dispense Refill  .  HYDROcodone-acetaminophen (NORCO/VICODIN) 5-325 MG per tablet Take 2 tablets by mouth every 4 (four) hours as needed.      Marland Kitchen amLODipine (NORVASC) 5 MG tablet Take 1 tablet (5 mg total) by mouth daily.  30 tablet  11  . DiphenhydrAMINE HCl, Sleep, (UNISOM SLEEPGELS) 50 MG CAPS Take 50 mg by mouth as needed.      Marland Kitchen HYDROcodone-acetaminophen (NORCO) 7.5-325 MG per tablet Take 1 tablet by mouth every 4 (four) hours as needed.       No current facility-administered medications for this visit.   REVIEW OF SYSTEMS: Arly.Keller ] denotes positive finding; [  ] denotes negative finding  CARDIOVASCULAR:  [ ]  chest pain   [ ]  chest pressure   [ ]  palpitations   [ ]  orthopnea   Arly.Keller ] dyspnea on exertion   [ ]  claudication   [ ]  rest pain   [ ]  DVT   [ ]  phlebitis PULMONARY:   [ ]  productive cough   [ ]  asthma   [ ]  wheezing NEUROLOGIC:   [ ]  weakness  [ ]  paresthesias  [ ]  aphasia  [ ]  amaurosis  [ ]  dizziness HEMATOLOGIC:   [ ]  bleeding problems   [ ]  clotting disorders MUSCULOSKELETAL:  [ ]  joint pain   [ ]  joint swelling [ ]  leg swelling GASTROINTESTINAL: [ ]   blood in stool  [ ]   hematemesis GENITOURINARY:  [ ]   dysuria  [ ]   hematuria PSYCHIATRIC:  [ ]  history of major depression INTEGUMENTARY:  [ ]  rashes  [ ]   ulcers CONSTITUTIONAL:  [ ]  fever   [ ]  chills  PHYSICAL EXAM: Filed Vitals:   12/15/12 0932  BP: 168/66  Pulse: 76  Resp: 16  Height: 5' 5.5" (1.664 m)  Weight: 171 lb 9.6 oz (77.837 kg)  SpO2: 97%   Body mass index is 28.11 kg/(m^2). GENERAL: The patient is a well-nourished female, in no acute distress. The vital signs are documented above. CARDIOVASCULAR: There is a regular rate and rhythm. Do not detect carotid bruits. She has palpable femoral, popliteal, and diminished but palpable dorsalis pedis and posterior tibial pulses bilaterally. She has no significant lower extremity swelling. PULMONARY: There is good air exchange bilaterally without wheezing or rales. ABDOMEN: Soft and  non-tender with normal pitched bowel sounds.  MUSCULOSKELETAL: There are no major deformities or cyanosis. NEUROLOGIC: No focal weakness or paresthesias are detected. SKIN: There are no ulcers or rashes noted. PSYCHIATRIC: The patient has a normal affect.  DATA:  I have reviewed her CT scan. 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 The calcific disease in the distal aorta is circumferential.  MEDICAL ISSUES: This patient is scheduled for ALIF at the L4-L5 level. I've explained to her that I am concerned about the calcium in her distal aorta and left common iliac artery. If there are no good options otherwise to address her back issue then I think it would be reasonable to proceed with exposure of the L4-L5 disc level. However, if she has significant calcific disease then there is a significant chance that we would have to not proceed with the procedure given the risk for aortic injury. This is a decision that might have to be made intraoperatively. I have reviewed our role in exposure of the spine in order to allow anterior lumbar interbody fusion at the appropriate levels. We have discussed the potential complications of surgery, including but not limited to, arterial or venous injury, thrombosis, or bleeding. We have also discussed the potential risks of wound healing problems, the development of a hernia, nerve injury, leg swelling, or other unpredictable medical problems.  I will discuss this case with Dr. Marissa Nestle tomorrow. All the patient's questions were answered and they are agreeable to proceed.  DICKSON,CHRISTOPHER S [facility]: [phone]

## 2012-12-16 ENCOUNTER — Encounter (HOSPITAL_COMMUNITY): Payer: Self-pay | Admitting: Pharmacy Technician

## 2012-12-20 ENCOUNTER — Encounter (HOSPITAL_COMMUNITY): Payer: Self-pay

## 2012-12-20 ENCOUNTER — Ambulatory Visit (HOSPITAL_COMMUNITY)
Admission: RE | Admit: 2012-12-20 | Discharge: 2012-12-20 | Disposition: A | Discharge status: Home / Self Care | Payer: Medicare Other | Source: Ambulatory Visit | Attending: Orthopedic Surgery | Admitting: Orthopedic Surgery

## 2012-12-20 ENCOUNTER — Encounter (HOSPITAL_COMMUNITY)
Admission: RE | Admit: 2012-12-20 | Discharge: 2012-12-20 | Disposition: A | Discharge status: Home / Self Care | Payer: Medicare Other | Source: Ambulatory Visit | Attending: Orthopedic Surgery | Admitting: Orthopedic Surgery

## 2012-12-20 DIAGNOSIS — Z01818 Encounter for other preprocedural examination: Secondary | ICD-10-CM | POA: Insufficient documentation

## 2012-12-20 DIAGNOSIS — Z01812 Encounter for preprocedural laboratory examination: Secondary | ICD-10-CM | POA: Insufficient documentation

## 2012-12-20 HISTORY — DX: Frequency of micturition: R35.0

## 2012-12-20 HISTORY — DX: Personal history of other diseases of the nervous system and sense organs: Z86.69

## 2012-12-20 HISTORY — DX: Dorsalgia, unspecified: M54.9

## 2012-12-20 HISTORY — DX: Other chronic pain: G89.29

## 2012-12-20 HISTORY — DX: Other skin changes: R23.8

## 2012-12-20 HISTORY — DX: Insomnia, unspecified: G47.00

## 2012-12-20 HISTORY — DX: Nocturia: R35.1

## 2012-12-20 HISTORY — DX: Urgency of urination: R39.15

## 2012-12-20 HISTORY — DX: Personal history of other diseases of the respiratory system: Z87.09

## 2012-12-20 HISTORY — DX: Personal history of colon polyps, unspecified: Z86.0100

## 2012-12-20 HISTORY — DX: Shortness of breath: R06.02

## 2012-12-20 HISTORY — DX: Panic disorder (episodic paroxysmal anxiety): F41.0

## 2012-12-20 HISTORY — DX: Unspecified osteoarthritis, unspecified site: M19.90

## 2012-12-20 HISTORY — DX: Personal history of colonic polyps: Z86.010

## 2012-12-20 LAB — URINALYSIS, ROUTINE W REFLEX MICROSCOPIC
Bilirubin Urine: NEGATIVE
Glucose, UA: NEGATIVE mg/dL
Hgb urine dipstick: NEGATIVE
Ketones, ur: NEGATIVE mg/dL
Nitrite: NEGATIVE
Protein, ur: NEGATIVE mg/dL
Specific Gravity, Urine: 1.01 (ref 1.005–1.030)
Urobilinogen, UA: 0.2 mg/dL (ref 0.0–1.0)
pH: 7 (ref 5.0–8.0)

## 2012-12-20 LAB — COMPREHENSIVE METABOLIC PANEL
ALT: 23 U/L (ref 0–35)
AST: 29 U/L (ref 0–37)
Albumin: 3.9 g/dL (ref 3.5–5.2)
Alkaline Phosphatase: 61 U/L (ref 39–117)
BUN: 23 mg/dL (ref 6–23)
CO2: 29 mEq/L (ref 19–32)
Calcium: 10 mg/dL (ref 8.4–10.5)
Chloride: 102 mEq/L (ref 96–112)
Creatinine, Ser: 0.89 mg/dL (ref 0.50–1.10)
GFR calc Af Amer: 73 mL/min — ABNORMAL LOW (ref >90)
GFR calc non Af Amer: 63 mL/min — ABNORMAL LOW (ref >90)
Glucose, Bld: 111 mg/dL — ABNORMAL HIGH (ref 70–99)
Potassium: 4.2 mEq/L (ref 3.5–5.1)
Sodium: 139 mEq/L (ref 135–145)
Total Bilirubin: 0.2 mg/dL — ABNORMAL LOW (ref 0.3–1.2)
Total Protein: 7.5 g/dL (ref 6.0–8.3)

## 2012-12-20 LAB — CBC WITH DIFFERENTIAL/PLATELET
Basophils Absolute: 0.1 10*3/uL (ref 0.0–0.1)
Basophils Relative: 1 % (ref 0–1)
Eosinophils Absolute: 0.3 10*3/uL (ref 0.0–0.7)
Eosinophils Relative: 3 % (ref 0–5)
HCT: 33.4 % — ABNORMAL LOW (ref 36.0–46.0)
Hemoglobin: 11.4 g/dL — ABNORMAL LOW (ref 12.0–15.0)
Lymphocytes Relative: 58 % — ABNORMAL HIGH (ref 12–46)
Lymphs Abs: 4.8 10*3/uL — ABNORMAL HIGH (ref 0.7–4.0)
MCH: 31.1 pg (ref 26.0–34.0)
MCHC: 34.1 g/dL (ref 30.0–36.0)
MCV: 91 fL (ref 78.0–100.0)
Monocytes Absolute: 0.7 10*3/uL (ref 0.1–1.0)
Monocytes Relative: 9 % (ref 3–12)
Neutro Abs: 2.4 10*3/uL (ref 1.7–7.7)
Neutrophils Relative %: 29 % — ABNORMAL LOW (ref 43–77)
Platelets: 274 10*3/uL (ref 150–400)
RBC: 3.67 MIL/uL — ABNORMAL LOW (ref 3.87–5.11)
RDW: 13 % (ref 11.5–15.5)
WBC: 8.2 10*3/uL (ref 4.0–10.5)

## 2012-12-20 LAB — TYPE AND SCREEN
ABO/RH(D): A NEG
Antibody Screen: NEGATIVE

## 2012-12-20 LAB — SURGICAL PCR SCREEN
MRSA, PCR: NEGATIVE
Staphylococcus aureus: NEGATIVE

## 2012-12-20 LAB — URINE MICROSCOPIC-ADD ON

## 2012-12-20 LAB — PROTIME-INR
INR: 0.99 (ref 0.00–1.49)
Prothrombin Time: 12.9 seconds (ref 11.6–15.2)

## 2012-12-20 LAB — APTT: aPTT: 27 seconds (ref 24–37)

## 2012-12-20 NOTE — Pre-Procedure Instructions (Signed)
[name]  12/20/2012   Your procedure is scheduled on:  Thurs, Sept 4 @ 7:30 AM  Report to [facility] at 5:30 AM.  Call this number if you have problems the morning of surgery: [phone]   Remember:   Do not eat food or drink liquids after midnight.   Take these medicines the morning of surgery with A SIP OF WATER: Pain Pill(if needed)               No Aspirin,Goody's,BC's,Aleve,Ibuprofen,Fish Oil,or any Herbal Medications   Do not wear jewelry, make-up or nail polish.  Do not wear lotions, powders, or perfumes. You may wear deodorant.  Do not shave 48 hours prior to surgery.   Do not bring valuables to the hospital.  [facility] is not responsible                   for any belongings or valuables.  Contacts, dentures or bridgework may not be worn into surgery.  Leave suitcase in the car. After surgery it may be brought to your room.  For patients admitted to the hospital, checkout time is 11:00 AM the day of  discharge.   Special Instructions: Shower using CHG 2 nights before surgery and the night before surgery.  If you shower the day of surgery use CHG.  Use special wash - you have one bottle of CHG for all showers.  You should use approximately 1/3 of the bottle for each shower.   Please read over the following fact sheets that you were given: Pain Booklet, Coughing and Deep Breathing, Blood Transfusion Information, MRSA Information and Surgical Site Infection Prevention

## 2012-12-20 NOTE — Progress Notes (Signed)
Saw Dr.McDowell around 2012-but hasn't seen him since or wasn't asked to do follow up  Stress test reports in epic from 2002/2006  Echo report in epic from 2013  Heart cath done around 2007-2008  Medical Md is Dr.Zach Margo Aye   EKG report in epic from 01-14-12  Denies CXR in past yr

## 2012-12-21 ENCOUNTER — Other Ambulatory Visit: Payer: Self-pay

## 2012-12-21 LAB — ABO/RH: ABO/RH(D): A NEG

## 2012-12-30 ENCOUNTER — Inpatient Hospital Stay (HOSPITAL_COMMUNITY): Admission: RE | Admit: 2012-12-30 | Payer: Medicare Other | Source: Ambulatory Visit | Admitting: Orthopedic Surgery

## 2012-12-30 ENCOUNTER — Encounter (HOSPITAL_COMMUNITY): Admission: RE | Payer: Self-pay | Source: Ambulatory Visit

## 2012-12-30 SURGERY — ANTERIOR LUMBAR FUSION 1 LEVEL
Anesthesia: General

## 2013-01-03 ENCOUNTER — Other Ambulatory Visit: Payer: Self-pay | Admitting: Orthopedic Surgery

## 2013-01-06 ENCOUNTER — Encounter (HOSPITAL_COMMUNITY): Payer: Self-pay | Admitting: Pharmacy Technician

## 2013-01-12 ENCOUNTER — Encounter (HOSPITAL_COMMUNITY): Payer: Self-pay | Admitting: *Deleted

## 2013-01-12 MED ORDER — VANCOMYCIN HCL IN DEXTROSE 1-5 GM/200ML-% IV SOLN
1000.0000 mg | INTRAVENOUS | Status: AC
  Administered 2013-01-13: 1000 mg via INTRAVENOUS
  Filled 2013-01-12: qty 200

## 2013-01-12 NOTE — Progress Notes (Signed)
Pt denies taking Norvasc.  I read her notes from Dr Orson Gear office visit last September.  "Well I never took it and my BP runs low. States she did not have appointment to follow up with Dr Edilia Bo.

## 2013-01-13 ENCOUNTER — Encounter (HOSPITAL_COMMUNITY)
Admission: RE | Disposition: A | Discharge status: Home Health | Payer: Medicare Other | Source: Ambulatory Visit | Attending: Orthopedic Surgery

## 2013-01-13 ENCOUNTER — Encounter (HOSPITAL_COMMUNITY): Payer: Self-pay | Admitting: Anesthesiology

## 2013-01-13 ENCOUNTER — Inpatient Hospital Stay (HOSPITAL_COMMUNITY)
Admission: RE | Admit: 2013-01-13 | Discharge: 2013-01-16 | DRG: 460 | Disposition: A | Discharge status: Home Health | Payer: Medicare Other | Source: Ambulatory Visit | Attending: Orthopedic Surgery | Admitting: Orthopedic Surgery

## 2013-01-13 ENCOUNTER — Inpatient Hospital Stay (HOSPITAL_COMMUNITY): Payer: Medicare Other | Admitting: Anesthesiology

## 2013-01-13 ENCOUNTER — Inpatient Hospital Stay (HOSPITAL_COMMUNITY): Payer: Medicare Other

## 2013-01-13 DIAGNOSIS — M48061 Spinal stenosis, lumbar region without neurogenic claudication: Principal | ICD-10-CM | POA: Diagnosis present

## 2013-01-13 DIAGNOSIS — Z860100 Personal history of colon polyps, unspecified: Secondary | ICD-10-CM

## 2013-01-13 DIAGNOSIS — Z853 Personal history of malignant neoplasm of breast: Secondary | ICD-10-CM

## 2013-01-13 DIAGNOSIS — K219 Gastro-esophageal reflux disease without esophagitis: Secondary | ICD-10-CM | POA: Diagnosis present

## 2013-01-13 DIAGNOSIS — G43909 Migraine, unspecified, not intractable, without status migrainosus: Secondary | ICD-10-CM | POA: Diagnosis present

## 2013-01-13 DIAGNOSIS — I2510 Atherosclerotic heart disease of native coronary artery without angina pectoris: Secondary | ICD-10-CM | POA: Diagnosis present

## 2013-01-13 DIAGNOSIS — G8929 Other chronic pain: Secondary | ICD-10-CM | POA: Diagnosis present

## 2013-01-13 HISTORY — DX: Other seasonal allergic rhinitis: J30.2

## 2013-01-13 LAB — COMPREHENSIVE METABOLIC PANEL
ALT: 54 U/L — ABNORMAL HIGH (ref 0–35)
AST: 54 U/L — ABNORMAL HIGH (ref 0–37)
Albumin: 3.9 g/dL (ref 3.5–5.2)
Alkaline Phosphatase: 77 U/L (ref 39–117)
BUN: 18 mg/dL (ref 6–23)
CO2: 26 mEq/L (ref 19–32)
Calcium: 10.3 mg/dL (ref 8.4–10.5)
Chloride: 101 mEq/L (ref 96–112)
Creatinine, Ser: 0.86 mg/dL (ref 0.50–1.10)
GFR calc Af Amer: 76 mL/min — ABNORMAL LOW (ref >90)
GFR calc non Af Amer: 66 mL/min — ABNORMAL LOW (ref >90)
Glucose, Bld: 95 mg/dL (ref 70–99)
Potassium: 4.2 mEq/L (ref 3.5–5.1)
Sodium: 137 mEq/L (ref 135–145)
Total Bilirubin: 0.2 mg/dL — ABNORMAL LOW (ref 0.3–1.2)
Total Protein: 7.2 g/dL (ref 6.0–8.3)

## 2013-01-13 LAB — CBC WITH DIFFERENTIAL/PLATELET
Basophils Absolute: 0.1 10*3/uL (ref 0.0–0.1)
Basophils Relative: 1 % (ref 0–1)
Eosinophils Absolute: 0.3 10*3/uL (ref 0.0–0.7)
Eosinophils Relative: 5 % (ref 0–5)
HCT: 32.7 % — ABNORMAL LOW (ref 36.0–46.0)
Hemoglobin: 11 g/dL — ABNORMAL LOW (ref 12.0–15.0)
Lymphocytes Relative: 57 % — ABNORMAL HIGH (ref 12–46)
Lymphs Abs: 4.1 10*3/uL — ABNORMAL HIGH (ref 0.7–4.0)
MCH: 31.1 pg (ref 26.0–34.0)
MCHC: 33.6 g/dL (ref 30.0–36.0)
MCV: 92.4 fL (ref 78.0–100.0)
Monocytes Absolute: 0.5 10*3/uL (ref 0.1–1.0)
Monocytes Relative: 7 % (ref 3–12)
Neutro Abs: 2.1 10*3/uL (ref 1.7–7.7)
Neutrophils Relative %: 30 % — ABNORMAL LOW (ref 43–77)
Platelets: 257 10*3/uL (ref 150–400)
RBC: 3.54 MIL/uL — ABNORMAL LOW (ref 3.87–5.11)
RDW: 12.9 % (ref 11.5–15.5)
WBC: 7.1 10*3/uL (ref 4.0–10.5)

## 2013-01-13 LAB — URINALYSIS, ROUTINE W REFLEX MICROSCOPIC
Bilirubin Urine: NEGATIVE
Glucose, UA: NEGATIVE mg/dL
Hgb urine dipstick: NEGATIVE
Ketones, ur: NEGATIVE mg/dL
Leukocytes, UA: NEGATIVE
Nitrite: NEGATIVE
Protein, ur: NEGATIVE mg/dL
Specific Gravity, Urine: 1.008 (ref 1.005–1.030)
Urobilinogen, UA: 0.2 mg/dL (ref 0.0–1.0)
pH: 7.5 (ref 5.0–8.0)

## 2013-01-13 LAB — PROTIME-INR
INR: 0.98 (ref 0.00–1.49)
Prothrombin Time: 12.8 seconds (ref 11.6–15.2)

## 2013-01-13 LAB — TYPE AND SCREEN
ABO/RH(D): A NEG
Antibody Screen: NEGATIVE

## 2013-01-13 LAB — APTT: aPTT: 28 seconds (ref 24–37)

## 2013-01-13 SURGERY — POSTERIOR LUMBAR FUSION 1 LEVEL
Anesthesia: General | Site: Spine Lumbar | Laterality: Right | Wound class: Clean

## 2013-01-13 MED ORDER — PROPOFOL INFUSION 10 MG/ML OPTIME
INTRAVENOUS | Status: DC | PRN
  Administered 2013-01-13: 75 ug/kg/min via INTRAVENOUS

## 2013-01-13 MED ORDER — ARTIFICIAL TEARS OP OINT
TOPICAL_OINTMENT | OPHTHALMIC | Status: DC | PRN
  Administered 2013-01-13: 1 via OPHTHALMIC

## 2013-01-13 MED ORDER — DIPHENHYDRAMINE HCL 50 MG/ML IJ SOLN: 12.5000 mg | Freq: Four times a day (QID) | INTRAMUSCULAR | Status: DC | PRN

## 2013-01-13 MED ORDER — LACTATED RINGERS IV SOLN
INTRAVENOUS | Status: DC | PRN
  Administered 2013-01-13 (×4): via INTRAVENOUS

## 2013-01-13 MED ORDER — FENTANYL CITRATE 0.05 MG/ML IJ SOLN
INTRAMUSCULAR | Status: DC | PRN
  Administered 2013-01-13: 50 ug via INTRAVENOUS
  Administered 2013-01-13 (×2): 100 ug via INTRAVENOUS
  Administered 2013-01-13 (×3): 50 ug via INTRAVENOUS

## 2013-01-13 MED ORDER — MUPIROCIN 2 % EX OINT: TOPICAL_OINTMENT | Freq: Once | CUTANEOUS | Status: DC

## 2013-01-13 MED ORDER — BUPIVACAINE-EPINEPHRINE PF 0.25-1:200000 % IJ SOLN
INTRAMUSCULAR | Status: AC
  Filled 2013-01-13: qty 30

## 2013-01-13 MED ORDER — SENNA 8.6 MG PO TABS
1.0000 | ORAL_TABLET | Freq: Two times a day (BID) | ORAL | Status: DC
  Administered 2013-01-14 – 2013-01-16 (×6): 8.6 mg via ORAL
  Filled 2013-01-13 (×7): qty 1

## 2013-01-13 MED ORDER — SODIUM CHLORIDE 0.9 % IV SOLN: 250.0000 mL | INTRAVENOUS | Status: DC

## 2013-01-13 MED ORDER — MORPHINE SULFATE (PF) 1 MG/ML IV SOLN
INTRAVENOUS | Status: DC
  Administered 2013-01-14: 3 mg via INTRAVENOUS
  Administered 2013-01-14: 12.65 mg via INTRAVENOUS
  Administered 2013-01-14: 06:00:00 via INTRAVENOUS
  Administered 2013-01-14: 16.5 mg via INTRAVENOUS
  Filled 2013-01-13: qty 25

## 2013-01-13 MED ORDER — ONDANSETRON HCL 4 MG/2ML IJ SOLN: 4.0000 mg | Freq: Four times a day (QID) | INTRAMUSCULAR | Status: DC | PRN

## 2013-01-13 MED ORDER — POVIDONE-IODINE 7.5 % EX SOLN
Freq: Once | CUTANEOUS | Status: DC
  Filled 2013-01-13: qty 118

## 2013-01-13 MED ORDER — MORPHINE SULFATE 2 MG/ML IJ SOLN
2.0000 mg | INTRAMUSCULAR | Status: DC | PRN
  Administered 2013-01-14 (×3): 2 mg via INTRAVENOUS
  Filled 2013-01-13 (×3): qty 1

## 2013-01-13 MED ORDER — ONDANSETRON HCL 4 MG/2ML IJ SOLN: 4.0000 mg | INTRAMUSCULAR | Status: DC | PRN

## 2013-01-13 MED ORDER — SODIUM CHLORIDE 0.9 % IJ SOLN
3.0000 mL | Freq: Two times a day (BID) | INTRAMUSCULAR | Status: DC
  Administered 2013-01-14 – 2013-01-16 (×4): 3 mL via INTRAVENOUS

## 2013-01-13 MED ORDER — DIAZEPAM 5 MG PO TABS
5.0000 mg | ORAL_TABLET | Freq: Four times a day (QID) | ORAL | Status: DC | PRN
  Administered 2013-01-14 – 2013-01-16 (×7): 5 mg via ORAL
  Filled 2013-01-13 (×7): qty 1

## 2013-01-13 MED ORDER — MIDAZOLAM HCL 5 MG/5ML IJ SOLN
INTRAMUSCULAR | Status: DC | PRN
  Administered 2013-01-13: 2 mg via INTRAVENOUS

## 2013-01-13 MED ORDER — ACETAMINOPHEN 650 MG RE SUPP: 650.0000 mg | RECTAL | Status: DC | PRN

## 2013-01-13 MED ORDER — METHYLENE BLUE 1 % INJ SOLN
INTRAMUSCULAR | Status: AC
  Filled 2013-01-13: qty 10

## 2013-01-13 MED ORDER — SODIUM CHLORIDE 0.9 % IJ SOLN: 9.0000 mL | INTRAMUSCULAR | Status: DC | PRN

## 2013-01-13 MED ORDER — SODIUM CHLORIDE 0.9 % IJ SOLN: 3.0000 mL | INTRAMUSCULAR | Status: DC | PRN

## 2013-01-13 MED ORDER — HYDROMORPHONE HCL PF 1 MG/ML IJ SOLN
INTRAMUSCULAR | Status: AC
  Filled 2013-01-13: qty 1

## 2013-01-13 MED ORDER — DIPHENHYDRAMINE HCL 12.5 MG/5ML PO ELIX: 12.5000 mg | ORAL_SOLUTION | Freq: Four times a day (QID) | ORAL | Status: DC | PRN

## 2013-01-13 MED ORDER — THROMBIN 20000 UNITS EX SOLR
CUTANEOUS | Status: DC | PRN
  Administered 2013-01-13: 14:00:00 via TOPICAL

## 2013-01-13 MED ORDER — MENTHOL 3 MG MT LOZG: 1.0000 | LOZENGE | OROMUCOSAL | Status: DC | PRN

## 2013-01-13 MED ORDER — LIDOCAINE HCL (CARDIAC) 20 MG/ML IV SOLN
INTRAVENOUS | Status: DC | PRN
  Administered 2013-01-13: 100 mg via INTRATRACHEAL
  Administered 2013-01-13: 30 mg via INTRAVENOUS

## 2013-01-13 MED ORDER — 0.9 % SODIUM CHLORIDE (POUR BTL) OPTIME
TOPICAL | Status: DC | PRN
  Administered 2013-01-13: 1000 mL

## 2013-01-13 MED ORDER — ALUM & MAG HYDROXIDE-SIMETH 200-200-20 MG/5ML PO SUSP: 30.0000 mL | Freq: Four times a day (QID) | ORAL | Status: DC | PRN

## 2013-01-13 MED ORDER — VANCOMYCIN HCL IN DEXTROSE 750-5 MG/150ML-% IV SOLN
750.0000 mg | Freq: Two times a day (BID) | INTRAVENOUS | Status: DC
  Administered 2013-01-14 (×2): 750 mg via INTRAVENOUS
  Filled 2013-01-13 (×2): qty 150

## 2013-01-13 MED ORDER — FENTANYL CITRATE 0.05 MG/ML IJ SOLN
INTRAMUSCULAR | Status: AC
  Administered 2013-01-13: 50 ug via INTRAVENOUS
  Filled 2013-01-13: qty 2

## 2013-01-13 MED ORDER — HYDROMORPHONE HCL PF 1 MG/ML IJ SOLN
0.2500 mg | INTRAMUSCULAR | Status: DC | PRN
  Administered 2013-01-13 (×3): 0.5 mg via INTRAVENOUS

## 2013-01-13 MED ORDER — NALOXONE HCL 0.4 MG/ML IJ SOLN: 0.4000 mg | INTRAMUSCULAR | Status: DC | PRN

## 2013-01-13 MED ORDER — FENTANYL CITRATE 0.05 MG/ML IJ SOLN
100.0000 ug | Freq: Once | INTRAMUSCULAR | Status: AC
  Administered 2013-01-13: 50 ug via INTRAVENOUS

## 2013-01-13 MED ORDER — MUPIROCIN 2 % EX OINT
TOPICAL_OINTMENT | CUTANEOUS | Status: AC
  Filled 2013-01-13: qty 22

## 2013-01-13 MED ORDER — SUCCINYLCHOLINE CHLORIDE 20 MG/ML IJ SOLN
INTRAMUSCULAR | Status: DC | PRN
  Administered 2013-01-13: 100 mg via INTRAVENOUS

## 2013-01-13 MED ORDER — ALBUMIN HUMAN 5 % IV SOLN: INTRAVENOUS | Status: DC | PRN

## 2013-01-13 MED ORDER — EPHEDRINE SULFATE 50 MG/ML IJ SOLN
INTRAMUSCULAR | Status: DC | PRN
  Administered 2013-01-13 (×6): 5 mg via INTRAVENOUS

## 2013-01-13 MED ORDER — DOCUSATE SODIUM 100 MG PO CAPS
100.0000 mg | ORAL_CAPSULE | Freq: Two times a day (BID) | ORAL | Status: DC
  Administered 2013-01-14 – 2013-01-16 (×6): 100 mg via ORAL
  Filled 2013-01-13 (×7): qty 1

## 2013-01-13 MED ORDER — BUPIVACAINE-EPINEPHRINE 0.25% -1:200000 IJ SOLN
INTRAMUSCULAR | Status: DC | PRN
  Administered 2013-01-13: 10 mL

## 2013-01-13 MED ORDER — PHENOL 1.4 % MT LIQD: 1.0000 | OROMUCOSAL | Status: DC | PRN

## 2013-01-13 MED ORDER — MORPHINE SULFATE (PF) 1 MG/ML IV SOLN
INTRAVENOUS | Status: AC
  Administered 2013-01-13: 21:00:00
  Filled 2013-01-13: qty 25

## 2013-01-13 MED ORDER — ALBUMIN HUMAN 5 % IV SOLN
INTRAVENOUS | Status: DC | PRN
  Administered 2013-01-13 (×2): via INTRAVENOUS

## 2013-01-13 MED ORDER — LACTATED RINGERS IV SOLN
INTRAVENOUS | Status: DC
  Administered 2013-01-13: 11:00:00 via INTRAVENOUS

## 2013-01-13 MED ORDER — ACETAMINOPHEN 325 MG PO TABS
650.0000 mg | ORAL_TABLET | ORAL | Status: DC | PRN
  Administered 2013-01-15: 650 mg via ORAL
  Filled 2013-01-13: qty 2

## 2013-01-13 MED ORDER — PROPOFOL 10 MG/ML IV BOLUS
INTRAVENOUS | Status: DC | PRN
  Administered 2013-01-13: 120 mg via INTRAVENOUS
  Administered 2013-01-13: 20 mg via INTRAVENOUS

## 2013-01-13 MED ORDER — DOUBLE ANTIBIOTIC 500-10000 UNIT/GM EX OINT
TOPICAL_OINTMENT | CUTANEOUS | Status: AC
  Filled 2013-01-13: qty 1

## 2013-01-13 MED ORDER — OXYCODONE-ACETAMINOPHEN 5-325 MG PO TABS
1.0000 | ORAL_TABLET | ORAL | Status: DC | PRN
  Administered 2013-01-14 – 2013-01-16 (×11): 2 via ORAL
  Filled 2013-01-13 (×11): qty 2

## 2013-01-13 MED ORDER — POTASSIUM CHLORIDE IN NACL 20-0.9 MEQ/L-% IV SOLN
INTRAVENOUS | Status: DC
  Administered 2013-01-14: 01:00:00 via INTRAVENOUS
  Filled 2013-01-13 (×7): qty 1000

## 2013-01-13 MED ORDER — INFLUENZA VAC SPLIT QUAD 0.5 ML IM SUSP
0.5000 mL | Freq: Once | INTRAMUSCULAR | Status: AC
  Administered 2013-01-16: 0.5 mL via INTRAMUSCULAR
  Filled 2013-01-13 (×2): qty 0.5

## 2013-01-13 SURGICAL SUPPLY — 87 items
APL SKNCLS STERI-STRIP NONHPOA (GAUZE/BANDAGES/DRESSINGS) ×1
BENZOIN TINCTURE PRP APPL 2/3 (GAUZE/BANDAGES/DRESSINGS) ×2 IMPLANT
BLADE SURG 10 STRL SS (BLADE) ×1 IMPLANT
BLADE SURG ROTATE 9660 (MISCELLANEOUS) IMPLANT
BUR ROUND PRECISION 4.0 (BURR) ×2 IMPLANT
CAGE CONCORDE BULLET 9X11X23 (Cage) ×2 IMPLANT
CAGE SPNL PRLL BLT NOSE 23X9 (Cage) IMPLANT
CARTRIDGE OIL MAESTRO DRILL (MISCELLANEOUS) ×2 IMPLANT
CLOTH BEACON ORANGE TIMEOUT ST (SAFETY) ×2 IMPLANT
CONT SPEC STER OR (MISCELLANEOUS) ×2 IMPLANT
CORDS BIPOLAR (ELECTRODE) ×2 IMPLANT
COVER SURGICAL LIGHT HANDLE (MISCELLANEOUS) ×3 IMPLANT
DIFFUSER DRILL AIR PNEUMATIC (MISCELLANEOUS) ×4 IMPLANT
DRAIN CHANNEL 15F RND FF W/TCR (WOUND CARE) IMPLANT
DRAPE C-ARM 42X72 X-RAY (DRAPES) ×2 IMPLANT
DRAPE ORTHO SPLIT 77X108 STRL (DRAPES) ×2
DRAPE POUCH INSTRU U-SHP 10X18 (DRAPES) ×2 IMPLANT
DRAPE SURG 17X23 STRL (DRAPES) ×6 IMPLANT
DRAPE SURG ORHT 6 SPLT 77X108 (DRAPES) ×1 IMPLANT
DURAPREP 26ML APPLICATOR (WOUND CARE) ×2 IMPLANT
ELECT BLADE 4.0 EZ CLEAN MEGAD (MISCELLANEOUS) ×2
ELECT CAUTERY BLADE 6.4 (BLADE) ×2 IMPLANT
ELECT REM PT RETURN 9FT ADLT (ELECTROSURGICAL) ×2
ELECTRODE BLDE 4.0 EZ CLN MEGD (MISCELLANEOUS) ×1 IMPLANT
ELECTRODE REM PT RTRN 9FT ADLT (ELECTROSURGICAL) ×1 IMPLANT
EVACUATOR SILICONE 100CC (DRAIN) IMPLANT
GAUZE SPONGE 4X4 16PLY XRAY LF (GAUZE/BANDAGES/DRESSINGS) ×8 IMPLANT
GLOVE BIO SURGEON STRL SZ7 (GLOVE) ×2 IMPLANT
GLOVE BIO SURGEON STRL SZ8 (GLOVE) ×2 IMPLANT
GLOVE BIOGEL PI IND STRL 7.5 (GLOVE) ×1 IMPLANT
GLOVE BIOGEL PI IND STRL 8 (GLOVE) ×1 IMPLANT
GLOVE BIOGEL PI INDICATOR 7.5 (GLOVE) ×1
GLOVE BIOGEL PI INDICATOR 8 (GLOVE) ×1
GOWN STRL NON-REIN LRG LVL3 (GOWN DISPOSABLE) ×4 IMPLANT
GOWN STRL REIN XL XLG (GOWN DISPOSABLE) ×2 IMPLANT
GUIDEWIRE SHARP VIPER II (WIRE) ×1 IMPLANT
IV CATH 14GX2 1/4 (CATHETERS) ×2 IMPLANT
KIT BASIN OR (CUSTOM PROCEDURE TRAY) ×2 IMPLANT
KIT DILATOR XLIF 5 (KITS) IMPLANT
KIT MAXCESS (KITS) ×1 IMPLANT
KIT NDL NVM5 EMG ELECT (KITS) IMPLANT
KIT NEEDLE NVM5 EMG ELECT (KITS) ×1 IMPLANT
KIT NEEDLE NVM5 EMG ELECTRODE (KITS) ×1
KIT POSITION SURG JACKSON T1 (MISCELLANEOUS) ×2 IMPLANT
KIT ROOM TURNOVER OR (KITS) ×2 IMPLANT
KIT XLIF (KITS) ×1
MARKER SKIN DUAL TIP RULER LAB (MISCELLANEOUS) ×2 IMPLANT
MIX DBX 10CC 35% BONE (Bone Implant) ×1 IMPLANT
NDL HYPO 25GX1X1/2 BEV (NEEDLE) ×1 IMPLANT
NDL JAMSHIDI VIPER (NEEDLE) IMPLANT
NDL SPNL 18GX3.5 QUINCKE PK (NEEDLE) ×2 IMPLANT
NEEDLE BONE MARROW 8GX6 FENEST (NEEDLE) IMPLANT
NEEDLE HYPO 25GX1X1/2 BEV (NEEDLE) ×2 IMPLANT
NEEDLE JAMSHIDI VIPER (NEEDLE) ×4 IMPLANT
NEEDLE SPNL 18GX3.5 QUINCKE PK (NEEDLE) ×4 IMPLANT
NS IRRIG 1000ML POUR BTL (IV SOLUTION) ×2 IMPLANT
OIL CARTRIDGE MAESTRO DRILL (MISCELLANEOUS) ×4
PACK LAMINECTOMY ORTHO (CUSTOM PROCEDURE TRAY) ×2 IMPLANT
PACK UNIVERSAL I (CUSTOM PROCEDURE TRAY) ×2 IMPLANT
PAD ARMBOARD 7.5X6 YLW CONV (MISCELLANEOUS) ×4 IMPLANT
PATTIES SURGICAL .5 X1 (DISPOSABLE) ×2 IMPLANT
PATTIES SURGICAL .5X1.5 (GAUZE/BANDAGES/DRESSINGS) ×2 IMPLANT
ROD PRE BENT EXPEDIUM 35MM (Rod) ×1 IMPLANT
SCREW POLY VIPER MIS 7X40MM (Screw) ×1 IMPLANT
SCREW POLY VIPER MIS 7X45MM (Screw) ×1 IMPLANT
SCREW SET SINGLE INNER (Screw) ×1 IMPLANT
SPONGE GAUZE 4X4 12PLY (GAUZE/BANDAGES/DRESSINGS) ×2 IMPLANT
SPONGE INTESTINAL PEANUT (DISPOSABLE) ×2 IMPLANT
SPONGE SURGIFOAM ABS GEL 100 (HEMOSTASIS) ×2 IMPLANT
STRIP CLOSURE SKIN 1/2X4 (GAUZE/BANDAGES/DRESSINGS) ×3 IMPLANT
SURGIFLO TRUKIT (HEMOSTASIS) IMPLANT
SUT MNCRL AB 4-0 PS2 18 (SUTURE) ×4 IMPLANT
SUT VIC AB 0 CT1 18XCR BRD 8 (SUTURE) ×1 IMPLANT
SUT VIC AB 0 CT1 8-18 (SUTURE) ×2
SUT VIC AB 1 CT1 18XCR BRD 8 (SUTURE) ×2 IMPLANT
SUT VIC AB 1 CT1 8-18 (SUTURE) ×4
SUT VIC AB 2-0 CT2 18 VCP726D (SUTURE) ×2 IMPLANT
SYR 20CC LL (SYRINGE) ×2 IMPLANT
SYR BULB IRRIGATION 50ML (SYRINGE) ×2 IMPLANT
SYR CONTROL 10ML LL (SYRINGE) ×2 IMPLANT
TAP CANN VIPER2 DL 6.0 (TAP) ×1 IMPLANT
TAPE CLOTH SURG 4X10 WHT LF (GAUZE/BANDAGES/DRESSINGS) ×1 IMPLANT
TOWEL OR 17X24 6PK STRL BLUE (TOWEL DISPOSABLE) ×2 IMPLANT
TOWEL OR 17X26 10 PK STRL BLUE (TOWEL DISPOSABLE) ×2 IMPLANT
TRAY FOLEY CATH 16FRSI W/METER (SET/KITS/TRAYS/PACK) ×2 IMPLANT
WATER STERILE IRR 1000ML POUR (IV SOLUTION) ×2 IMPLANT
YANKAUER SUCT BULB TIP NO VENT (SUCTIONS) ×2 IMPLANT

## 2013-01-13 NOTE — Anesthesia Procedure Notes (Signed)
Procedure Name: Intubation Date/Time: 01/13/2013 12:13 PM Performed by: Marena Chancy Pre-anesthesia Checklist: Patient identified, Emergency Drugs available, Suction available, Patient being monitored and Timeout performed Patient Re-evaluated:Patient Re-evaluated prior to inductionOxygen Delivery Method: Circle system utilized Preoxygenation: Pre-oxygenation with 100% oxygen Intubation Type: IV induction Ventilation: Mask ventilation without difficulty Laryngoscope Size: Miller and 2 Grade View: Grade III Tube type: Oral Tube size: 7.5 mm Number of attempts: 1 Airway Equipment and Method: Stylet Placement Confirmation: positive ETCO2,  ETT inserted through vocal cords under direct vision,  CO2 detector and breath sounds checked- equal and bilateral Secured at: 21 cm Tube secured with: Tape Dental Injury: Teeth and Oropharynx as per pre-operative assessment

## 2013-01-13 NOTE — H&P (Signed)
PREOPERATIVE H&P  Chief Complaint: ongoing left leg pain  HPI: [name] is a 72 y.o. female who presents with ongoing left leg pain s/p previous lumbar decompressions. MRI = foraminal stenosis at 4/5. Xrays = instability at 4/5. Patient failed multiple forms of conservative care.  Past Medical History  Diagnosis Date  . Coronary atherosclerosis of native coronary artery     Nonobstructive at catheterization 2008 - 40% circumflex  . Environmental allergies   . Shortness of breath     with exertion  . History of bronchitis     >27yrs ago  . Arthritis     back  . History of migraine     last time a few months ago  . Chronic back pain   . Bruises easily   . History of colon polyps   . Urinary frequency   . Nocturia   . Urinary urgency   . Panic attacks     no meds required  . Insomnia     doesn't take any meds  . Seasonal allergies   . GERD (gastroesophageal reflux disease)     doesn't take any meds from this.  Takes tums as neeeded.  . Breast cancer     Right mastectomy   Past Surgical History  Procedure Laterality Date  . Abdominal hysterectomy    . Cholecystectomy    . Bladder suspension    . Back surgery  1981/2011  . Breast surgery Right   . Colonoscopy    . Esophagogastroduodenoscopy    . Foot surgery Right 2014    d/t plantar fascitis  . Breast reconstruction  2004    with abd tissue  . Eye surgery      lasik   History   Social History  . Marital Status: Married    Spouse Name: N/A    Number of Children: N/A  . Years of Education: N/A   Social History Main Topics  . Smoking status: Former Smoker    Types: Cigarettes  . Smokeless tobacco: None     Comment: quit about 25yrs ago  . Alcohol Use: No  . Drug Use: No  . Sexual Activity: Not Currently   Other Topics Concern  . None   Social History Narrative  . None   Family History  Problem Relation Age of Onset  . Coronary artery disease Mother   . Coronary artery disease Father   .  Coronary artery disease Brother   . Cancer Sister    Allergies  Allergen Reactions  . Aspirin Other (See Comments)    Causes hemoptysis  . Penicillins Swelling and Other (See Comments)    blisters   Prior to Admission medications   Medication Sig Start Date End Date Taking? Authorizing Provider  HYDROcodone-acetaminophen (NORCO/VICODIN) 5-325 MG per tablet Take 2 tablets by mouth every 4 (four) hours as needed for pain.  12/14/12  Yes Historical Provider, MD     All other systems have been reviewed and were otherwise negative with the exception of those mentioned in the HPI and as above.  Physical Exam: There were no vitals filed for this visit.  General: Alert, no acute distress Cardiovascular: No pedal edema Respiratory: No cyanosis, no use of accessory musculature Skin: No lesions in the area of chief complaint Neurologic: Sensation intact distally Psychiatric: Patient is competent for consent with normal mood and affect Lymphatic: No axillary or cervical lymphadenopathy  MUSCULOSKELETAL: + SLR ON LEFT  Assessment/Plan: Left leg pain Plan for Procedure(s): LATERAL  INTERBODY FUSION WITH PSF WITH INSTRUMENTATION AND ALLOGRAFT   Emilee Hero, MD 01/13/2013 7:19 AM

## 2013-01-13 NOTE — Anesthesia Preprocedure Evaluation (Signed)
Anesthesia Evaluation  Patient identified by MRN, date of birth, ID band Patient awake    Reviewed: Allergy & Precautions, H&P , NPO status , Patient's Chart, lab work & pertinent test results  Airway Mallampati: II TM Distance: >3 FB Neck ROM: Full    Dental  (+) Teeth Intact and Dental Advisory Given   Pulmonary  breath sounds clear to auscultation        Cardiovascular Rhythm:Regular Rate:Normal     Neuro/Psych    GI/Hepatic   Endo/Other    Renal/GU      Musculoskeletal   Abdominal   Peds  Hematology   Anesthesia Other Findings   Reproductive/Obstetrics                           Anesthesia Physical Anesthesia Plan  ASA: II  Anesthesia Plan: General   Post-op Pain Management:    Induction: Intravenous  Airway Management Planned: Oral ETT  Additional Equipment:   Intra-op Plan:   Post-operative Plan: Extubation in OR  Informed Consent: I have reviewed the patients History and Physical, chart, labs and discussed the procedure including the risks, benefits and alternatives for the proposed anesthesia with the patient or authorized representative who has indicated his/her understanding and acceptance.   Dental advisory given  Plan Discussed with: CRNA and Anesthesiologist  Anesthesia Plan Comments: (HNP L4-5 H/O SOB, no significant CAD by cath and stress Echo, PFTs ok  Plan GA with oral ETT  Kipp Brood, MD)        Anesthesia Quick Evaluation

## 2013-01-13 NOTE — Progress Notes (Signed)
ANTIBIOTIC CONSULT NOTE - INITIAL  Pharmacy Consult for vancomycin Indication: post op prophylaxis  Allergies  Allergen Reactions  . Aspirin Other (See Comments)    Causes hemoptysis  . Penicillins Swelling and Other (See Comments)    blisters    Patient Measurements: Height: 5' 5.5" (166.4 cm) Weight: 170 lb 4 oz (77.225 kg) IBW/kg (Calculated) : 58.15   Vital Signs: Temp: 98 F (36.7 C) (09/18 2252) Temp src: Oral (09/18 2252) BP: 147/59 mmHg (09/18 2252) Pulse Rate: 87 (09/18 2252) Intake/Output from previous day:   Intake/Output from this shift: Total I/O In: 1300 [I.V.:1300] Out: 100 [Urine:50; Blood:50]  Labs:  Recent Labs  01/13/13 1012  WBC 7.1  HGB 11.0*  PLT 257  CREATININE 0.86   Estimated Creatinine Clearance: 61.4 ml/min (by C-G formula based on Cr of 0.86). No results found for this basename: VANCOTROUGH, Leodis Binet, VANCORANDOM, GENTTROUGH, GENTPEAK, GENTRANDOM, TOBRATROUGH, TOBRAPEAK, TOBRARND, AMIKACINPEAK, AMIKACINTROU, AMIKACIN,  in the last 72 hours   Microbiology: Recent Results (from the past 720 hour(s))  SURGICAL PCR SCREEN     Status: None   Collection Time    12/20/12  9:28 AM      Result Value Range Status   MRSA, PCR NEGATIVE  NEGATIVE Final   Staphylococcus aureus NEGATIVE  NEGATIVE Final   Comment:            The Xpert SA Assay (FDA     approved for NASAL specimens     in patients over 63 years of age),     is one component of     a comprehensive surveillance     program.  Test performance has     been validated by [employer] for patients greater     than or equal to 67 year old.     It is not intended     to diagnose infection nor to     guide or monitor treatment.    Medical History: Past Medical History  Diagnosis Date  . Coronary atherosclerosis of native coronary artery     Nonobstructive at catheterization 2008 - 40% circumflex  . Environmental allergies   . Shortness of breath     with exertion  .  History of bronchitis     >49yrs ago  . Arthritis     back  . History of migraine     last time a few months ago  . Chronic back pain   . Bruises easily   . History of colon polyps   . Urinary frequency   . Nocturia   . Urinary urgency   . Panic attacks     no meds required  . Insomnia     doesn't take any meds  . Seasonal allergies   . GERD (gastroesophageal reflux disease)     doesn't take any meds from this.  Takes tums as neeeded.  . Breast cancer     Right mastectomy    Medications:  Prescriptions prior to admission  Medication Sig Dispense Refill  . [DISCONTINUED] HYDROcodone-acetaminophen (NORCO/VICODIN) 5-325 MG per tablet Take 2 tablets by mouth every 4 (four) hours as needed for pain.        Assessment: 72 yo lady to receive vancomycin post op prophylaxis.  She received vancomycin 1 gm earlier today at ~12:00.   Goal of Therapy:  Vancomycin trough level 10-15 mcg/ml  Plan:  Vancomycin 750 mg IV q12 hours F/u LOT, renal function and clinical course.  Dreyton Roessner,  Svara Twyman Poteet 01/13/2013,11:28 PM

## 2013-01-13 NOTE — Anesthesia Postprocedure Evaluation (Signed)
  Anesthesia Post-op Note  Patient: [name]  Procedure(s) Performed: Procedure(s) with comments: POSTERIOR LUMBAR FUSION 1 LEVEL (Right) - Right sided lateral interbody fusion with instrumentation and allograft; Lumbar 4-5 posterior spinal fusion with instrumentation and allograft.  Patient Location: PACU  Anesthesia Type:General  Level of Consciousness: awake  Airway and Oxygen Therapy: Patient Spontanous Breathing and Patient connected to nasal cannula oxygen  Post-op Pain: mild  Post-op Assessment: Post-op Vital signs reviewed, Patient's Cardiovascular Status Stable, Respiratory Function Stable, Patent Airway and No signs of Nausea or vomiting  Post-op Vital Signs: Reviewed and stable  Complications: No apparent anesthesia complications

## 2013-01-13 NOTE — Transfer of Care (Signed)
Immediate Anesthesia Transfer of Care Note  Patient: [name]  Procedure(s) Performed: Procedure(s) with comments: POSTERIOR LUMBAR FUSION 1 LEVEL (Right) - Right sided lateral interbody fusion with instrumentation and allograft; Lumbar 4-5 posterior spinal fusion with instrumentation and allograft.  Patient Location: PACU  Anesthesia Type:General  Level of Consciousness: sedated  Airway & Oxygen Therapy: Patient remains intubated per anesthesia plan  Post-op Assessment: Report given to PACU RN and Post -op Vital signs reviewed and stable  Post vital signs: Reviewed and stable  Complications: No apparent anesthesia complications

## 2013-01-13 NOTE — Preoperative (Signed)
Beta Blockers   Reason not to administer Beta Blockers:Not Applicable 

## 2013-01-13 NOTE — Progress Notes (Signed)
Pt. Temp has returned to normal level. VSS. Pt follows commands and has good tidal volumes on minimal vent settings. Extubated to face mask O2. Sats 100%.

## 2013-01-14 MED ORDER — GABAPENTIN 300 MG PO CAPS: 300.0000 mg | ORAL_CAPSULE | Freq: Every day | ORAL | Status: DC

## 2013-01-14 MED ORDER — GABAPENTIN 300 MG PO CAPS
300.0000 mg | ORAL_CAPSULE | Freq: Every day | ORAL | Status: DC
  Administered 2013-01-14 – 2013-01-15 (×2): 300 mg via ORAL
  Filled 2013-01-14 (×3): qty 1

## 2013-01-14 MED FILL — Sodium Chloride IV Soln 0.9%: INTRAVENOUS | Qty: 1000 | Status: AC

## 2013-01-14 MED FILL — Heparin Sodium (Porcine) Inj 1000 Unit/ML: INTRAMUSCULAR | Qty: 30 | Status: AC

## 2013-01-14 MED FILL — Mupirocin Oint 2%: CUTANEOUS | Qty: 22 | Status: AC

## 2013-01-14 NOTE — Op Note (Signed)
NAMEGRACELYN, [name]                ACCOUNT NO.:  [id number]  MEDICAL RECORD NO.:  [id number]  LOCATION:  5N03C                        FACILITY:  [facility]  PHYSICIAN:  Estill Bamberg, MD      DATE OF BIRTH:  08/19/1940  DATE OF PROCEDURE:  01/13/2013                              OPERATIVE REPORT   PREOPERATIVE DIAGNOSES: 1. 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 2. Severe left-sided L4 radiculopathy. 3. Status post multiple lumbar decompressions.  POSTOPERATIVE DIAGNOSES: 1. 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 2. Severe left-sided L4 radiculopathy. 3. Status post multiple lumbar decompressions.  PROCEDURES: 1. Attempted right-sided lateral interbody fusion.  I did have to     abort this portion of the procedure, given the location of the     lumbar plexus.  The patient was distinctly aware of this     possibility. 2. Left-sided L4-5 transforaminal lumbar interbody fusion, including a     revision decompression on the left. 3. Right-sided posterolateral fusion. 4. Placement of posterior instrumentation, L4-L5. 5. Insertion of interbody device x1 (11 x 23 mm Concorde bullet cage). 6. Use of local autograft. 7. Use of morselized allograft (DBX mix). 8. Intraoperative use of fluoroscopy.  SURGEON:  Estill Bamberg, MD  ASSISTANT:  [name], [name]  ANESTHESIA:  General endotracheal anesthesia.  COMPLICATIONS:  None.  DISPOSITION:  Stable.  ESTIMATED BLOOD LOSS:  250 mL.  INDICATIONS FOR PROCEDURE:  Briefly, [name] is a very pleasant 72- year-old female, who did initially present to me on August 17, 2012, with severe pain in her left leg.  Of note, the patient was status post a previous decompression, and per the patient, she did not obtain any benefit from the surgery.  She continued to have severe ongoing pain in the left leg.  Again, the patient states that she has been having the pain since 2010.  I did review an MRI, which was clearly notable for rather  profound and severe left-sided L4-5 foraminal stenosis.  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  There was also noted to be subacute L4 and L5 radiculopathy.  The patient also was noted to have a significant 9 mm spondylolisthesis across the L4-5 level.  Given the essential findings on the MRI, we did discuss attempting to proceed with a right-sided lateral interbody fusion.  The patient was fully aware that I would attempt to perform this procedure, if this is not possible, a posterior procedure would be needed.  As reflected above, a right-sided flank incision was utilized, and I was unable to access the intervertebral disk from the right side, given the proximity of the lumbar plexus to the lateral aspect of the disk.  This portion of the procedure was therefore aborted, and the wound was closed.  I then consulted with the family and informed them that a posterior fusion would be indicated with interbody fusion.  The patient was fully aware of this distinct possibility.  Patient did fully understand the risks and limitations of the procedure as well.  Also of note, as a previous EMG did reveal chronic left-sided S1 radiculopathy, I did let her know that any pain related to the S1 nerve  would be unlikely to improve with surgical intervention.  OPERATIVE DETAILS:  On January 13, 2013, the patient was brought to Surgery and general endotracheal anesthesia was administered. Initially, the patient was placed in the lateral decubitus position with the right side up.  As previously noted, an incision was made in line with the L4-5 interspace.  The external, then internal, then transversalis fascia was dissected and the retroperitoneal space was identified.  I did make multiple attempts to dock the initial dilator over the intervertebral space.  I was ultimately able to do this. However, upon placement of the retractor, there were significant nerves associated with a  lumbar plexus, clearly noted in the operative field. I did make multiple attempts to try to mobilize the nerves and there was a safe area, but this was not able to be done in a safe manner.  I therefore removed the retractor and irrigated the wound and closed the wound in the usual fashion using 2-0 Vicryl and 3-0 Monocryl.  Benzoin and Steri-Strips were applied.  At this portion of the procedure, I did exit the room to discuss with the family that I did decide to proceed with a posterior fusion.  Again, the patient was fully aware that this was a distinct possibility.  The patient was then placed prone on a Jackson spinal frame.  All bony prominences were meticulously padded. The back was then prepped and draped in the usual sterile fashion.  Time- out procedure was again performed.  Antibiotics were given.  I then made an incision in line with the patient's previous incision.  There was abundant scar tissue identified over the epidural space.  I was able to identify the lamina of L4 and L5 as well as the L4-5 facet joint.  It did appear to me there was an attempted fusion at the L4-5 level, as there was granular bone identified in the region of the facet joint.  At this point, I did liberally used AP and lateral fluoroscopy and I did use Jamshidi needles to cannulate the L4 and L5 pedicles.  A guidewire was placed through the Jamshidi needles and a 6-mm tap was utilized to cannulate the L4 and L5 pedicles.  The tap was tested using triggered EMG, and the taps were not tested below 15 milliamps.  The guidewires were then left in place.  At this point, I did proceed with the facetectomy.  I did liberally use a curette to tease away the epidural scar and the dura from the laminotomy defect noted.  I then used a series of Kerrison punches to remove the facet joint.  Of note, this was an extremely meticulous portion of the procedure.  This portion of the procedure was additionally meticulous,  given the significant scar tissue identified and the significant foraminal stenosis noted on the left- sided L4-5.  In any event, the facet joint was ultimately removed.  This portion of the procedure did take approximately 90 minutes to do safely, whereas normally, this takes about 10 minutes.  I then was able to identify the exiting L4 nerve and the traversing L5 nerve.  I was unable to gain access to the posterolateral anulus on the left side.  I then used a series of paddle scrapers as well as a series of curettes, and I was able to focus to perform a thorough and complete diskectomy.  The endplates were prepared meticulously, and at this point, I did obtain 10 mL of allograft in the form of DBX mix.  This was then mixed with the autograft obtained from removing the facet joint.  This was liberally packed into the intervertebral space.  I then placed a series of trials. I did feel that an 11 mm x 23 mm interbody spacer would be the most appropriate fit.  This was again packed with allograft and autograft and tamped into position in the usual fashion.  I was very pleased with the press fit of the endplate and the appearance of the construct on the lateral images.  I then placed a 7 x 45 mm screw on the left at L4 and a 7 x 40 mm screw on the left at L5.  A 35-mm rod was then secured into the screws and caps were placed followed by a final locking procedure. I did apply compression across the construct.  The guidewires were removed prior to placing the 35 mm rod.  Again, at this point, I was very pleased with the decompression and with the appearance of the construct on AP and lateral radiographs.  I then turned my attention towards the right side and I exposed the transverse processes of L4-L5, and they were decorticated using a high-speed burr.  The remainder of the autograft and allograft was packed into the posterolateral gutter on the right.  At this point, the wound was copiously  irrigated.  The fascia was closed using #1 Vicryl.  The subcutaneous layer was then closed using 2-0 Vicryl.  The skin was then closed using 2-0 nylon. Bacitracin ointment was then applied followed by sterile dressing.  All instrument counts were correct at the termination of the procedure.  Of note, [name], was my assistant throughout the entirety of the procedure, and did aid in essential retraction and suctioning required throughout the surgery.  Also of note, I did use neurologic monitoring throughout the entirety of the procedure, and there was no abnormal sustained EMG activity throughout the surgery.     Estill Bamberg, MD     MD/MEDQ  D:  01/13/2013  T:  01/14/2013  Job:  [id number]

## 2013-01-14 NOTE — Evaluation (Signed)
Occupational Therapy Evaluation Patient Details Name: ROMILDA PROBY MRN: [id number] DOB: [birth year] Today's Date: 01/14/2013 Time: 9562-1308 OT Time Calculation (min): 26 min  OT Assessment / Plan / Recommendation History of present illness pt with L4-5 decompression   Clinical Impression   Pt demos decline in function with ADLs and ADL mobility safety and would benefit from acute OT services to address impairments to help restore PLOF to return home safely    OT Assessment  Patient needs continued OT Services    Follow Up Recommendations  Home health OT;Supervision/Assistance - 24 hour    Barriers to Discharge Decreased caregiver support    Equipment Recommendations  3 in 1 bedside comode    Recommendations for Other Services    Frequency  Min 2X/week    Precautions / Restrictions Precautions Precautions: Back;Fall Precaution Comments: Pt able to recall 2/3 back precautions. Reviewed all back precautions with pt Required Braces or Orthoses: Spinal Brace Spinal Brace: Thoracolumbosacral orthotic;Applied in sitting position Restrictions Weight Bearing Restrictions: No   Pertinent Vitals/Pain 7/10    ADL  Grooming: Performed;Wash/dry hands;Wash/dry face;Min guard Where Assessed - Grooming: Supported standing Upper Body Bathing: Simulated;Supervision/safety;Set up Lower Body Bathing: Maximal assistance Upper Body Dressing: Performed;Supervision/safety;Set up;Other (comment) (min A to donn TLSO) Lower Body Dressing: Performed Toilet Transfer: Performed;Moderate assistance Toilet Transfer Method: Sit to stand Toilet Transfer Equipment: Raised toilet seat with arms (or 3-in-1 over toilet);Grab bars Toileting - Clothing Manipulation and Hygiene: Performed;Moderate assistance Where Assessed - Toileting Clothing Manipulation and Hygiene: Standing Equipment Used: Long-handled shoe horn;Long-handled sponge;Reacher;Gait belt;Rolling walker Transfers/Ambulation Related to ADLs:  cues for safety with correct hand placement and to maintain back precautions ADL Comments: Pt rpovded with education and demo of ADL A/E    OT Diagnosis: Generalized weakness;Acute pain  OT Problem List: Decreased strength;Decreased knowledge of use of DME or AE;Pain;Decreased safety awareness;Impaired balance (sitting and/or standing);Decreased activity tolerance;Decreased knowledge of precautions OT Treatment Interventions: Self-care/ADL training;Therapeutic exercise;Patient/family education;Neuromuscular education;Balance training;Therapeutic activities;DME and/or AE instruction   OT Goals(Current goals can be found in the care plan section) Acute Rehab OT Goals Patient Stated Goal: to go home tomorrow OT Goal Formulation: With patient Time For Goal Achievement: 01/21/13 Potential to Achieve Goals: Good ADL Goals Pt Will Perform Grooming: with set-up;with supervision;sitting Pt Will Perform Lower Body Bathing: with mod assist;with adaptive equipment;with caregiver independent in assisting Pt Will Perform Lower Body Dressing: with mod assist;with caregiver independent in assisting;with adaptive equipment Pt Will Transfer to Toilet: with min assist;with min guard assist;grab bars;ambulating (3 in 1) Pt Will Perform Toileting - Clothing Manipulation and hygiene: with min assist;sit to/from stand;with adaptive equipment;with caregiver independent in assisting Pt Will Perform Tub/Shower Transfer: with min assist;shower seat  Visit Information  Last OT Received On: 01/14/13 Assistance Needed: +1 History of Present Illness: pt with L4-5 decompression       Prior Functioning     Home Living Family/patient expects to be discharged to:: Private residence Living Arrangements: Spouse/significant other Available Help at Discharge: Family;Available 24 hours/day;Friend(s) Type of Home: House Home Access: Stairs to enter [employer] of Steps: 1 Entrance Stairs-Rails: None Home  Layout: Multi-level;Able to live on main level with bedroom/bathroom Home Equipment: Dan Humphreys - 2 wheels;Shower seat - built in Prior Function Level of Independence: Independent Communication Communication: No difficulties Dominant Hand: Right         Vision/Perception Vision - History Baseline Vision: Wears glasses only for reading Patient Visual Report: No change from baseline Perception Perception: Within Functional Limits  Cognition  Cognition Arousal/Alertness: Awake/alert Behavior During Therapy: WFL for tasks assessed/performed Overall Cognitive Status: Within Functional Limits for tasks assessed Memory: Decreased recall of precautions    Extremity/Trunk Assessment Upper Extremity Assessment Upper Extremity Assessment: Overall WFL for tasks assessed Lower Extremity Assessment Lower Extremity Assessment: Defer to PT evaluation Cervical / Trunk Assessment Cervical / Trunk Assessment: Normal     Mobility Bed Mobility Bed Mobility: Rolling Right;Right Sidelying to Sit;Sitting - Scoot to Edge of Bed Right Sidelying to Sit: 4: Min assist;With rails Left Sidelying to Sit: 4: Min assist;HOB flat;With rails Sitting - Scoot to Edge of Bed: 4: Min guard Details for Bed Mobility Assistance: cues and (A) for log rolling technique; pt c/o pain with bed mobility and required (A) to bring shoulders to upright sitting position  Transfers Transfers: Sit to Stand;Stand to Sit Sit to Stand: 3: Mod assist;From bed;From chair/3-in-1;From toilet Stand to Sit: 3: Mod assist;To chair/3-in-1;With armrests;To toilet Details for Transfer Assistance: pt very unsteady with transfers; requries mod (A) to maintain back precautions and maintain balance; pt with posterior lean and cues to find midline;      Exercise     Balance Balance Balance Assessed: Yes Static Standing Balance Static Standing - Balance Support: Left upper extremity supported;Bilateral upper extremity supported;During  functional activity Static Standing - Level of Assistance: 4: Min assist   End of Session OT - End of Session Equipment Utilized During Treatment: Gait belt;Rolling walker;Other (comment) (3 in 1, A/E kit) Activity Tolerance: Patient tolerated treatment well Patient left: in chair;with call bell/phone within reach  GO     Galen Manila 01/14/2013, 1:27 PM

## 2013-01-14 NOTE — Evaluation (Signed)
Physical Therapy Evaluation Patient Details Name: [name] MRN: [id number] DOB: June 29, 1940 Today's Date: 01/14/2013 Time: 1191-4782 PT Time Calculation (min): 27 min  PT Assessment / Plan / Recommendation History of Present Illness     Clinical Impression  Patient is s/p revision L4-L5 decompression and L4-L5 fusion surgery resulting in the deficits listed below (see PT Problem List). Patient will benefit from skilled PT to increase their independence and safety with mobility (while adhering to their precautions) to allow discharge Pt very lethargic and unsteady with gt this session. Husband and pt attribute this to pain medication and are highly motivated to return home vs. STSNF. Pt will benefit from amb with AD to incr independence and gt. Will need to address one step to enter house prior to D/C.      PT Assessment  Patient needs continued PT services    Follow Up Recommendations  Home health PT;Supervision/Assistance - 24 hour    Does the patient have the potential to tolerate intense rehabilitation      Barriers to Discharge        Equipment Recommendations  3in1 (PT)    Recommendations for Other Services OT consult   Frequency Min 5X/week    Precautions / Restrictions Precautions Precautions: Back;Fall Precaution Booklet Issued: Yes (comment) Precaution Comments: pt given back precautions handout and reviewed; pt very lethargic; unable to recall precautions at end of session Required Braces or Orthoses: Spinal Brace Spinal Brace: Thoracolumbosacral orthotic;Applied in sitting position Restrictions Weight Bearing Restrictions: No   Pertinent Vitals/Pain Sitting EOB on RA O2 stats at 100%; c/o Lt leg pain; stated "pain is easing off" while in chair; did not rate pain. PCA recently D/C.      Mobility  Bed Mobility Bed Mobility: Rolling Left;Left Sidelying to Sit Rolling Left: 4: Min assist;With rail Left Sidelying to Sit: 4: Min assist;HOB flat;With  rails Details for Bed Mobility Assistance: cues and (A) for log rolling technique; pt c/o pain with bed mobility and required (A) to bring shoulders to upright sitting position  Transfers Transfers: Sit to Stand;Stand to Sit Sit to Stand: 3: Mod assist;From bed Stand to Sit: 3: Mod assist;To chair/3-in-1;With armrests Details for Transfer Assistance: pt very unsteady with transfers; requries mod (A) to maintain back precautions and maintain balance; pt with posterior lean and cues to find midline;  Ambulation/Gait Ambulation/Gait Assistance: 3: Mod assist Ambulation Distance (Feet): 12 Feet Assistive device: 1 person hand held assist Ambulation/Gait Assistance Details: pt very unsteady with gt and demo posteriolateral lean during gt; pt with narrow BOS and shurt shuffled gt; pt will require AD to reduce risk of falls; pt very fearful of falling and demo decr safety awareness and balance with gt; (A) around waist and with Rt UE supported to maintain balance and amb to chair  Gait Pattern: Step-to pattern;Decreased stride length;Narrow base of support;Lateral trunk lean to left;Shuffle Gait velocity: very decreased Stairs: No Wheelchair Mobility Wheelchair Mobility: No    Exercises General Exercises - Lower Extremity Ankle Circles/Pumps: AROM;Both;10 reps;Supine   PT Diagnosis: Difficulty walking;Acute pain  PT Problem List: Decreased strength;Decreased range of motion;Decreased activity tolerance;Decreased balance;Decreased mobility;Decreased knowledge of use of DME;Decreased safety awareness;Decreased knowledge of precautions;Pain PT Treatment Interventions: DME instruction;Gait training;Stair training;Functional mobility training;Therapeutic activities;Therapeutic exercise;Balance training;Neuromuscular re-education;Patient/family education     PT Goals(Current goals can be found in the care plan section) Acute Rehab PT Goals Patient Stated Goal: to go home tomorrow PT Goal  Formulation: With patient/family Time For Goal Achievement: 01/21/13 Potential  to Achieve Goals: Good  Visit Information  Last PT Received On: 01/14/13 Assistance Needed: +1       Prior Functioning  Home Living Family/patient expects to be discharged to:: Private residence Living Arrangements: Spouse/significant other Available Help at Discharge: Family;Available 24 hours/day;Friend(s) Type of Home: House Home Access: Stairs to enter [employer] of Steps: 1 Entrance Stairs-Rails: None Home Layout: Multi-level;Able to live on main level with bedroom/bathroom Home Equipment: Dan Humphreys - 2 wheels Prior Function Level of Independence: Independent Communication Communication: No difficulties Dominant Hand: Right    Cognition  Cognition Arousal/Alertness: Lethargic;Suspect due to medications Behavior During Therapy: Flat affect Overall Cognitive Status: Within Functional Limits for tasks assessed Memory: Decreased recall of precautions    Extremity/Trunk Assessment Upper Extremity Assessment Upper Extremity Assessment: Defer to OT evaluation Lower Extremity Assessment Lower Extremity Assessment: LLE deficits/detail LLE Deficits / Details: grossly 3/5; limited by pain LLE: Unable to fully assess due to pain Cervical / Trunk Assessment Cervical / Trunk Assessment: Normal   Balance Balance Balance Assessed: Yes Static Standing Balance Static Standing - Balance Support: Right upper extremity supported;During functional activity Static Standing - Level of Assistance: 4: Min assist;3: Mod assist Static Standing - Comment/# of Minutes: peformed pregait activities and pt required min to mod (A) to maintain balance; pt seemed unaware tht she was demo posterior lean and was given mod to max cues for upright posture   End of Session PT - End of Session Equipment Utilized During Treatment: Gait belt;Back brace Activity Tolerance: Patient limited by lethargy;Patient limited by  pain Patient left: in chair;with call bell/phone within reach;with family/visitor present Nurse Communication: Mobility status  GP     Donell Sievert, [place] [phone] 01/14/2013, 9:38 AM

## 2013-01-14 NOTE — Progress Notes (Signed)
Patient supine in bed. Reports moderate LBP and improved left leg pain. No right leg pain.  BP 156/47  Pulse 93  Temp(Src) 99.6 F (37.6 C) (Oral)  Resp 13  Ht 5' 5.5" (1.664 m)  Wt 77.225 kg (170 lb 4 oz)  BMI 27.89 kg/m2  SpO2 100%  NVI with full b/l strength NVI  POD #1 after revision 4/5 decompression and instrumented 4/5 fusion  - up with PT today - d/c PCA and start oral pain meds - start neurontin 300 mg 1 PO q hs - back precautions at all times, TLSO brace when ambulating

## 2013-01-14 NOTE — Progress Notes (Signed)
UR COMPLETED  

## 2013-01-14 NOTE — Progress Notes (Signed)
Pt very uncomfortable in bed, constantly turning and unable to lay in one position for longer than 20 minutes. Assisted to sit on edge of bed for approximately 10 minutes. Very drowsy during movement. 02 sats dropped to 82% on 2L while sitting. Instructed to deep breathe and cough. Achieved 800 on IS. Sats increased to 92%. C/o L leg pain, no worse than pre-op. Instructed pt of back precautions, did well following log roll. Assisted pt back into bed in comfortable position. Encourage pt to use PCA in which she stated she "forgets about it". PCA button within reach. Will continue to monitor.

## 2013-01-14 NOTE — Progress Notes (Signed)
Pt arrive to unit at 2250 from PACU. Pt very drowsy, awakens when spoken to. A&Ox4. VSS. Dsg C/D/I. On full dose morphine PCA. C/o 5 out of 10 pain. Full sensation to BUE, BLE. IV noted to R wrist, removed at 2300 due to R arm restriction-hx R mastectomy. C/o being uncomfortable in bed, assisted pt to reposition and encouraged to use PCA. Instructed on IS use and turn/cough/deep breathing. Instructed pt on back precautions. Will continue to monitor.

## 2013-01-15 ENCOUNTER — Other Ambulatory Visit: Payer: Self-pay

## 2013-01-15 MED ORDER — DIPHENHYDRAMINE HCL 12.5 MG/5ML PO ELIX
25.0000 mg | ORAL_SOLUTION | Freq: Four times a day (QID) | ORAL | Status: DC | PRN
  Administered 2013-01-15: 25 mg via ORAL
  Filled 2013-01-15: qty 10

## 2013-01-15 NOTE — Progress Notes (Signed)
Physical Therapy Treatment Patient Details Name: [name] MRN: [id number] DOB: 1940/08/23 Today's Date: 01/15/2013 Time: 5621-3086 PT Time Calculation (min): 20 min  PT Assessment / Plan / Recommendation  History of Present Illness pt with L4-5 decompression   PT Comments   Pt progressing with mobility.  Able to increase ambulation distance.  Pt states plans are for possible d/c home tomorrow per MD if she does well.  Pt needs to practice 1 step prior to d/c home.     Follow Up Recommendations  Home health PT;Supervision/Assistance - 24 hour     Does the patient have the potential to tolerate intense rehabilitation     Barriers to Discharge        Equipment Recommendations  3in1 (PT)    Recommendations for Other Services OT consult  Frequency Min 5X/week   Progress towards PT Goals Progress towards PT goals: Progressing toward goals  Plan Current plan remains appropriate    Precautions / Restrictions Precautions Precautions: Back;Fall Precaution Comments: Pt able to recall 3/3 back precautions.   Required Braces or Orthoses: Spinal Brace Spinal Brace: Thoracolumbosacral orthotic;Applied in sitting position Restrictions Weight Bearing Restrictions: No   Pertinent Vitals/Pain 5/10 back.  Premedicated.     Mobility  Bed Mobility Bed Mobility: Rolling Left;Left Sidelying to Sit;Sit to Sidelying Left Rolling Left: 5: Supervision;With rail Left Sidelying to Sit: 5: Supervision;HOB flat;With rails Sit to Sidelying Left: 5: Supervision;HOB flat Details for Bed Mobility Assistance: Incr time.  Relies heavily on rail to push herself to sitting upright.  Cues to reinforce technique.   Transfers Transfers: Sit to Stand;Stand to Sit Sit to Stand: 4: Min assist;With upper extremity assist;From bed Stand to Sit: 4: Min guard;With upper extremity assist;To bed Details for Transfer Assistance: cues for hand placement & to decrease trunk flexion with sit>stand.    Ambulation/Gait Ambulation/Gait Assistance: 4: Min guard Ambulation Distance (Feet): 100 Feet Assistive device: Rolling walker Ambulation/Gait Assistance Details: Cues for posture & safe RW management.   Gait Pattern: Step-through pattern;Decreased stride length;Trunk flexed Gait velocity: decreased Stairs: No Wheelchair Mobility Wheelchair Mobility: No       PT Goals (current goals can now be found in the care plan section) Acute Rehab PT Goals Patient Stated Goal: to go home tomorrow PT Goal Formulation: With patient/family Time For Goal Achievement: 01/21/13 Potential to Achieve Goals: Good  Visit Information  Last PT Received On: 01/15/13 Assistance Needed: +1 History of Present Illness: pt with L4-5 decompression    Subjective Data  Patient Stated Goal: to go home tomorrow   Cognition  Cognition Arousal/Alertness: Awake/alert Behavior During Therapy: WFL for tasks assessed/performed Overall Cognitive Status: Within Functional Limits for tasks assessed    Balance     End of Session PT - End of Session Equipment Utilized During Treatment: Gait belt;Back brace Activity Tolerance: Patient tolerated treatment well Patient left: in bed;with call bell/phone within reach Nurse Communication: Mobility status   GP     Lara Mulch 01/15/2013, 9:19 AM   Verdell Face, PTA [phone] 01/15/2013

## 2013-01-15 NOTE — Progress Notes (Signed)
PATIENT ID: [name]  MRN: [id number]  DOB/AGE:  06-12-1940 / 72 y.o.  2 Days Post-Op Procedure(s) (LRB): POSTERIOR LUMBAR FUSION 1 LEVEL (Right)    PROGRESS NOTE Subjective:   Patient is alert, oriented, no Nausea, no Vomiting, yes passing gas, no Bowel Movement. Taking PO well. Denies SOB, Chest or Calf Pain. Using Incentive Spirometer, PAS in place. , Patient reports pain as moderate,   Objective: Vital signs in last 24 hours: Temp:  [97.9 F (36.6 C)-98.5 F (36.9 C)] 97.9 F (36.6 C) (09/20 0436) Pulse Rate:  [83-114] 91 (09/20 0610) Resp:  [15-17] 16 (09/20 0436) BP: (92-143)/(45-122) 118/51 mmHg (09/20 0610) SpO2:  [94 %-100 %] 99 % (09/20 0610)    Intake/Output from previous day: I/O last 3 completed shifts: In: 2340 [P.O.:480; I.V.:1860] Out: 1550 [Urine:1500; Blood:50]   Intake/Output this shift:     LABORATORY DATA:  Recent Labs  01/13/13 1012  WBC 7.1  HGB 11.0*  HCT 32.7*  PLT 257  NA 137  K 4.2  CL 101  CO2 26  BUN 18  CREATININE 0.86  GLUCOSE 95  INR 0.98  CALCIUM 10.3    Examination: Neurologically intact Intact pulses distally Dorsiflexion/Plantar flexion intact}  Assessment:   2 Days Post-Op Procedure(s) (LRB): POSTERIOR LUMBAR FUSION 1 LEVEL (Right) ADDITIONAL DIAGNOSIS:  Coronary Athersclerosis  Plan:  Weight Bearing as Tolerated (WBAT)  DVT Prophylaxis:  Foot Pumps  DISCHARGE PLAN: Home when pt passes PT  DISCHARGE NEEDS: HHPT, HHRN, Walker and 3-in-1 comode seat  Pt is reminded to wear the TLSO brace when ambulating and is to have back precautions at all times. Continue with therapy per protocol.      Dilynn Munroe R 01/15/2013, 8:06 AM

## 2013-01-15 NOTE — Progress Notes (Signed)
Occupational Therapy Treatment Patient Details Name: [name] MRN: [id number] DOB: 23-Apr-1941 Today's Date: 01/15/2013 Time: 4098-1191 OT Time Calculation (min): 26 min  OT Assessment / Plan / Recommendation  History of present illness pt with L4-5 decompression   OT comments  Pt progressing toward goals. Will benefit from continued education to ensure safety and increased independence with ADLs prior to return home.  Pt hopes to go home tomorrow.  Follow Up Recommendations  Home health OT;Supervision/Assistance - 24 hour    Barriers to Discharge       Equipment Recommendations  3 in 1 bedside comode    Recommendations for Other Services    Frequency Min 2X/week   Progress towards OT Goals Progress towards OT goals: Progressing toward goals  Plan Discharge plan remains appropriate    Precautions / Restrictions Precautions Precautions: Back;Fall Precaution Comments: Pt able to recall 3/3 back precautions.   Required Braces or Orthoses: Spinal Brace Spinal Brace: Thoracolumbosacral orthotic;Applied in sitting position Restrictions Weight Bearing Restrictions: No   Pertinent Vitals/Pain See vitals    ADL  Grooming: Performed;Wash/dry face;Supervision/safety Where Assessed - Grooming: Unsupported standing Upper Body Dressing: Performed;Minimal assistance Where Assessed - Upper Body Dressing: Unsupported sitting Lower Body Dressing: Performed;Minimal assistance Where Assessed - Lower Body Dressing: Unsupported sit to stand Toilet Transfer: Performed;Min guard Toilet Transfer Method: Sit to Barista: Comfort height toilet;Grab bars Toileting - Clothing Manipulation and Hygiene: Performed;Min guard Where Assessed - Engineer, mining and Hygiene: Sit to stand from 3-in-1 or toilet Equipment Used: Back brace;Rolling walker Transfers/Ambulation Related to ADLs: supervision with RW ADL Comments: Educated pt and family on dressing  technique and brace information (donning/doffing).  Pt reports her shower seat is small and built into the wall, daughter also stating that the shower seat is too small for pt to safely sit on.  Educated pt and daughter on use of 3n1 as a shower chair.    OT Diagnosis:    OT Problem List:   OT Treatment Interventions:     OT Goals(current goals can now be found in the care plan section) Acute Rehab OT Goals Patient Stated Goal: to go home tomorrow OT Goal Formulation: With patient Time For Goal Achievement: 01/21/13 Potential to Achieve Goals: Good ADL Goals Pt Will Perform Grooming: with set-up;with supervision;sitting Pt Will Perform Lower Body Bathing: with mod assist;with adaptive equipment;with caregiver independent in assisting Pt Will Perform Lower Body Dressing: with mod assist;with caregiver independent in assisting;with adaptive equipment Pt Will Transfer to Toilet: with min assist;with min guard assist;grab bars;ambulating Pt Will Perform Toileting - Clothing Manipulation and hygiene: with min assist;sit to/from stand;with adaptive equipment;with caregiver independent in assisting Pt Will Perform Tub/Shower Transfer: with min assist;shower seat  Visit Information  Last OT Received On: 01/15/13 Assistance Needed: +1 History of Present Illness: pt with L4-5 decompression    Subjective Data      Prior Functioning       Cognition  Cognition Arousal/Alertness: Awake/alert Behavior During Therapy: WFL for tasks assessed/performed Overall Cognitive Status: Within Functional Limits for tasks assessed    Mobility  Bed Mobility Bed Mobility: Rolling Right;Right Sidelying to Sit;Sitting - Scoot to Edge of Bed Rolling Right: 5: Supervision;With rail Right Sidelying to Sit: 5: Supervision Sitting - Scoot to Edge of Bed: 5: Supervision  Details for Bed Mobility Assistance: Incr time. Heavily relying on rail. Transfers Transfers: Sit to Stand;Stand to Sit Sit to Stand: 4:  Min guard;From bed;From toilet;With upper extremity assist Stand to  Sit: 4: Min guard;To chair/3-in-1;To toilet;With upper extremity assist Details for Transfer Assistance: Cues for hand placement    Exercises      Balance     End of Session OT - End of Session Equipment Utilized During Treatment: Gait belt;Rolling walker;Back brace Activity Tolerance: Patient tolerated treatment well Patient left: in chair;with call bell/phone within reach;with family/visitor present  GO    01/15/2013 Cipriano Mile OTR/L Pager [phone] Office [phone]  Cipriano Mile 01/15/2013, 11:47 AM

## 2013-01-16 DIAGNOSIS — M48061 Spinal stenosis, lumbar region without neurogenic claudication: Secondary | ICD-10-CM | POA: Diagnosis present

## 2013-01-16 MED ORDER — OXYCODONE-ACETAMINOPHEN 5-325 MG PO TABS: 1.0000 | ORAL_TABLET | ORAL | Status: DC | PRN

## 2013-01-16 NOTE — Progress Notes (Signed)
Physical Therapy Treatment Patient Details Name: [name] MRN: [id number] DOB: Nov 18, 1940 Today's Date: 01/16/2013 Time: 4098-1191 PT Time Calculation (min): 14 min  PT Assessment / Plan / Recommendation  History of Present Illness pt with L4-5 decompression   PT Comments   Good performance of steps; discussed using same technique to get into SUV; OK for dc home from PT standpoint  Follow Up Recommendations  Home health PT;Supervision/Assistance - 24 hour     Does the patient have the potential to tolerate intense rehabilitation     Barriers to Discharge        Equipment Recommendations  3in1 (PT)    Recommendations for Other Services    Frequency Min 5X/week   Progress towards PT Goals Progress towards PT goals: Progressing toward goals  Plan Current plan remains appropriate    Precautions / Restrictions Precautions Precautions: Back;Fall Precaution Comments: Pt able to recall 3/3 back precautions.   Required Braces or Orthoses: Spinal Brace Spinal Brace: Thoracolumbosacral orthotic;Applied in sitting position   Pertinent Vitals/Pain Minimal pain; had received meds    Mobility  Bed Mobility Bed Mobility: Not assessed Details for Bed Mobility Assistance: Pt described proper way of getting up and down in and out of bed Transfers Transfers: Sit to Stand;Stand to Sit Sit to Stand: 4: Min guard;With upper extremity assist;With armrests;From bed Stand to Sit: With armrests;With upper extremity assist;5: Supervision Details for Transfer Assistance: guarding for safety Ambulation/Gait Ambulation/Gait Assistance: 5: Supervision Ambulation Distance (Feet): 120 Feet Assistive device: Rolling walker Ambulation/Gait Assistance Details: adjusted height of RW for optimal plsture Stairs: Yes Stairs Assistance: 4: Min guard Stair Management Technique: No rails;Backwards;With walker Number of Stairs: 1    Exercises     PT Diagnosis:    PT Problem List:   PT Treatment  Interventions:     PT Goals (current goals can now be found in the care plan section) Acute Rehab PT Goals Patient Stated Goal: to go home today PT Goal Formulation: With patient/family Time For Goal Achievement: 01/21/13 Potential to Achieve Goals: Good  Visit Information  Last PT Received On: 01/16/13 Assistance Needed: +1 History of Present Illness: pt with L4-5 decompression    Subjective Data  Subjective: looking forward to going home Patient Stated Goal: to go home today   Cognition  Cognition Arousal/Alertness: Awake/alert Behavior During Therapy: WFL for tasks assessed/performed Overall Cognitive Status: Within Functional Limits for tasks assessed    Balance     End of Session PT - End of Session Equipment Utilized During Treatment: Back brace Activity Tolerance: Patient tolerated treatment well Patient left: in chair;with call bell/phone within reach Nurse Communication: Mobility status   GP     Olen Pel [place], [place] [phone]  01/16/2013, 10:48 AM

## 2013-01-16 NOTE — Progress Notes (Signed)
   CARE MANAGEMENT NOTE 01/16/2013  Patient:  [name], [name]   Account Number:  [id number]  Date Initiated:  01/14/2013  Documentation initiated by:  Darlyne Russian  Subjective/Objective Assessment:   Patient admitted with left leg pain, 01/13/2013 lumbar decompression.     Action/Plan:   Progression of care and discharge planning   Anticipated DC Date:  01/15/2013   Anticipated DC Plan:        DC Planning Services  CM consult      [facility] Choice  HOME HEALTH   Choice offered to / List presented to:  C-1 Patient   DME arranged  3-N-1  Levan Hurst      DME agency  [facility]     HH arranged  HH-2 PT  HH-3 OT      [facility] agency  [facility]   Status of service:  Completed, signed off Medicare Important Message given?   (If response is "NO", the following Medicare IM given date fields will be blank) Date Medicare IM given:   Date Additional Medicare IM given:    Discharge Disposition:  HOME W HOME HEALTH SERVICES  Per UR Regulation:    If discussed at Long Length of Stay Meetings, dates discussed:    Comments:  01/16/13 14:13 CM spoke with pt for choice for HHPT/OT.  Pt chose [facility]. Rolling walker and 3n1 will be delivered to room prior to discharge.  Address and contact number verified for [facility] services.  Referral faxed to [facility].  No other CM needs communicated.  Freddy Jaksch, BSN, [place] [phone].   01/14/2013  [address] RN, [place]  [phone] CM consult:  NCM attempted to meet with patient, she just had pain medication and requests another time.

## 2013-01-16 NOTE — Discharge Summary (Signed)
Patient ID: [name] MRN: [id number] DOB/AGE: 01/23/1941 72 y.o.  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 Coronary atherosclerosis of native coronary artery     Nonobstructive at catheterization 2008 - 40% circumflex  . Environmental allergies   . Shortness of breath     with exertion  . History of bronchitis     >72yrs ago  . Arthritis     back  . History of migraine     last time a few months ago  . Chronic back pain   . Bruises easily   . History of colon polyps   . Urinary frequency   . Nocturia   . Urinary urgency   . Panic attacks     no meds required  . Insomnia     doesn't take any meds  . Seasonal allergies   . GERD (gastroesophageal reflux disease)     doesn't take any meds from this.  Takes tums as neeeded.  . Breast cancer     Right mastectomy    Surgeries: Procedure(s): POSTERIOR LUMBAR FUSION 1 LEVEL on 01/13/2013   Consultants:    Discharged Condition: Improved  Hospital Course: [name] is an 72 y.o. female who was admitted 01/13/2013 for operative treatment ofForaminal stenosis of lumbar region. Patient has severe unremitting pain that affects sleep, daily activities, and work/hobbies. After pre-op clearance the patient was taken to the operating room on 01/13/2013 and underwent  Procedure(s): POSTERIOR LUMBAR FUSION 1 LEVEL.    Patient was given perioperative antibiotics: Anti-infectives   Start     Dose/Rate Route Frequency Ordered Stop   01/14/13 0000  vancomycin (VANCOCIN) IVPB 750 mg/150 ml premix  Status:  Discontinued     750 mg 150 mL/hr over 60 Minutes Intravenous Every 12 hours 01/13/13 2331 01/14/13 1305   01/13/13 0600  vancomycin (VANCOCIN) IVPB 1000 mg/200 mL premix     1,000 mg 200 mL/hr over 60 Minutes Intravenous On call to O.R. 01/12/13 1450 01/13/13  1158       Patient was given sequential compression devices, early ambulation, and will wear a TLSO brace postoperatively until her fusion is solid. The dressing was changed prior to discharge and had moderate drainage bloody. The patient has had dramatic radicular pain relief. Patient benefited maximally from hospital stay and there were no complications.    Recent vital signs: Patient Vitals for the past 24 hrs:  BP Temp Pulse Resp SpO2  01/16/13 0656 127/50 mmHg 99 F (37.2 C) 94 18 99 %  01/15/13 2031 121/49 mmHg 98.7 F (37.1 C) 74 18 99 %  01/15/13 1306 129/43 mmHg 97.9 F (36.6 C) 86 14 98 %     Recent laboratory studies:  Recent Labs  01/13/13 1012  WBC 7.1  HGB 11.0*  HCT 32.7*  PLT 257  NA 137  K 4.2  CL 101  CO2 26  BUN 18  CREATININE 0.86  GLUCOSE 95  INR 0.98  CALCIUM 10.3     Discharge Medications:     Medication List    STOP taking these medications       HYDROcodone-acetaminophen 5-325 MG per tablet  Commonly known as:  NORCO/VICODIN      TAKE these medications       gabapentin 300 MG capsule  Commonly known as:  NEURONTIN  Take  1 capsule (300 mg total) by mouth at bedtime.     oxyCODONE-acetaminophen 5-325 MG per tablet  Commonly known as:  ROXICET  Take 1 tablet by mouth every 4 (four) hours as needed for pain.        Diagnostic Studies: Dg Chest 2 View  12/20/2012   *RADIOLOGY REPORT*  Clinical Data: Preoperative evaluation for back surgery  CHEST - 2 VIEW  Comparison: 03/21/2011  Findings: The cardiac shadow is stable.  The lungs are clear. Postsurgical changes are again noted on the right.  No acute bony abnormality is seen.  IMPRESSION: No acute abnormality noted.   Original Report Authenticated By: Alcide Clever, M.D.   Dg Lumbar Spine 2-3 Views  01/13/2013   CLINICAL DATA:  72 year old female undergoing lumbar surgery.  EXAM: LUMBAR SPINE - 2-3 VIEW; DG C-ARM GT 120 MIN  COMPARISON:  1535 hr the same day and earlier.  FINDINGS:  Intraoperative frontal and lateral views of the lower lumbar spine at the level of the spondylolisthesis previously designated L4-L5. Left unilateral transpedicular fusion hardware as well as and interbody implant have been placed.  IMPRESSION: Interbody an unilateral transpedicular fusion depicted at L4-L5.  FLUOROSCOPY TIME: FLUOROSCOPY TIME  1 min 39 seconds.   Electronically Signed   By: Augusto Gamble M.D.   On: 01/13/2013 20:14   Dg Lumbar Spine 2-3 Views  01/13/2013   CLINICAL DATA:  72 year old female undergoing lumbar surgery.  EXAM: LUMBAR SPINE - 2-3 VIEW  COMPARISON:  [facility] lumbar MRI 08/21/2012.  FINDINGS: Same numbering system as on the comparison, which designated the level of grade 1 spondylolisthesis L4-L5.  Intraoperative portable cross-table lateral view of the 1513 hrs. Cephalad Surgical probe directed at the L4-L5 disc space. Caudal Surgical probe directed at the L5-S1 level.  Second film at 1535 hrs. Retractors at the L4 vertebral body level.  IMPRESSION: Intraoperative localization as above. Same numbering system as on 08/21/2012 MRI.   Electronically Signed   By: Augusto Gamble M.D.   On: 01/13/2013 20:10   Dg C-arm Gt 120 Min  01/13/2013   CLINICAL DATA:  72 year old female undergoing lumbar surgery.  EXAM: LUMBAR SPINE - 2-3 VIEW; DG C-ARM GT 120 MIN  COMPARISON:  1535 hr the same day and earlier.  FINDINGS: Intraoperative frontal and lateral views of the lower lumbar spine at the level of the spondylolisthesis previously designated L4-L5. Left unilateral transpedicular fusion hardware as well as and interbody implant have been placed.  IMPRESSION: Interbody an unilateral transpedicular fusion depicted at L4-L5.  FLUOROSCOPY TIME: FLUOROSCOPY TIME  1 min 39 seconds.   Electronically Signed   By: Augusto Gamble M.D.   On: 01/13/2013 20:14    Disposition: 01-Home or Self Care      Discharge Orders   Future Orders Complete By Expires   Call MD / Call 911  As  directed    Comments:     If you experience chest pain or shortness of breath, CALL 911 and be transported to the hospital emergency room.  If you develope a fever above 101 F, pus (white drainage) or increased drainage or redness at the wound, or calf pain, call your surgeon's office.   Constipation Prevention  As directed    Comments:     Drink plenty of fluids.  Prune juice may be helpful.  You may use a stool softener, such as Colace (over the counter) 100 mg twice a day.  Use MiraLax (over the counter) for constipation  as needed.   Diet - low sodium heart healthy  As directed    Discharge instructions  As directed    Comments:     Continue back precautions, or TLSO brace at all times, may change dressing as needed   Driving restrictions  As directed    Comments:     No driving for 2 weeks   Increase activity slowly as tolerated  As directed    Patient may shower  As directed    Comments:     You may shower without a dressing once there is no drainage.  Do not wash over the wound.  If drainage remains, cover wound with plastic wrap and then shower.      Follow-up Information   Follow up with Emilee Hero, MD In 6 days.   Specialty:  Orthopedic Surgery   Contact information:   [address] [place] [address] [phone]        Signed: Nestor Lewandowsky 01/16/2013, 8:47 AM

## 2013-01-16 NOTE — Progress Notes (Signed)
Occupational Therapy Treatment Patient Details Name: [name] MRN: [id number] DOB: 1940-09-28 Today's Date: 01/16/2013 Time: 4098-1191 OT Time Calculation (min): 25 min  OT Assessment / Plan / Recommendation  History of present illness pt with L4-5 decompression   OT comments  Pt has made excellent progress and has met all goals.  Anticipates d/c home today. Signing off.  Follow Up Recommendations  Home health OT;Supervision/Assistance - 24 hour    Barriers to Discharge       Equipment Recommendations  3 in 1 bedside comode    Recommendations for Other Services    Frequency Min 2X/week   Progress towards OT Goals Progress towards OT goals: Goals met/education completed, patient discharged from OT  Plan All goals met and education completed, patient discharged from OT services    Precautions / Restrictions Precautions Precautions: Back;Fall Precaution Comments: Pt able to recall 3/3 back precautions.   Required Braces or Orthoses: Spinal Brace Spinal Brace: Thoracolumbosacral orthotic;Applied in sitting position   Pertinent Vitals/Pain See vitals    ADL  Grooming: Performed;Wash/dry hands;Supervision/safety Where Assessed - Grooming: Unsupported standing Upper Body Bathing: Performed;Chest;Right arm;Left arm;Abdomen;Set up Where Assessed - Upper Body Bathing: Unsupported sitting Lower Body Bathing: Performed;Min guard Where Assessed - Lower Body Bathing: Unsupported sit to stand Upper Body Dressing: Performed;Minimal assistance Where Assessed - Upper Body Dressing: Unsupported sitting Lower Body Dressing: Performed;Minimal assistance Where Assessed - Lower Body Dressing: Unsupported sit to stand Toilet Transfer: Performed;Min guard Toilet Transfer Method: Sit to Barista: Comfort height toilet Toileting - Clothing Manipulation and Hygiene: Performed;Min guard Where Assessed - Engineer, mining and Hygiene: Sit to stand from  3-in-1 or toilet Tub/Shower Transfer: Simulated;Min guard Tub/Shower Transfer Method: Science writer: Walk in shower Equipment Used: Back brace;Rolling walker Transfers/Ambulation Related to ADLs: supervision with RW for ambulation in room. ADL Comments: Pt required min assist to donn back brace but was independent in instructing OT how to assist with donning/doffing (for simulation with caregiver assisting since no family present during session). Pt able to cross LEs while sitting in order to bathe LB, but needed min assist for threading RLE through pants mostly due to difficulty caused by grip socks.  Pt performed full bathing/dressing tasks with good safety awareness and excellent technique.  Pt has reacher and sock aid and verbalized understanding how to use AE for LB ADLs.    OT Diagnosis:    OT Problem List:   OT Treatment Interventions:     OT Goals(current goals can now be found in the care plan section) Acute Rehab OT Goals Patient Stated Goal: to go home today  Visit Information  Last OT Received On: 01/16/13 Assistance Needed: +1 History of Present Illness: pt with L4-5 decompression    Subjective Data      Prior Functioning       Cognition  Cognition Arousal/Alertness: Awake/alert Behavior During Therapy: WFL for tasks assessed/performed Overall Cognitive Status: Within Functional Limits for tasks assessed    Mobility  Bed Mobility Bed Mobility: Not assessed Details for Bed Mobility Assistance: pt sitting EOB Transfers Transfers: Sit to Stand;Stand to Sit Sit to Stand: 4: Min guard;From toilet;From chair/3-in-1;With upper extremity assist;With armrests;From bed Stand to Sit: To chair/3-in-1;To toilet;With armrests;With upper extremity assist;5: Supervision Details for Transfer Assistance: guarding for safety    Exercises      Balance     End of Session OT - End of Session Equipment Utilized During Treatment: Rolling walker;Back  brace Activity Tolerance: Patient  tolerated treatment well Patient left: in chair;with call bell/phone within reach (with PT) Nurse Communication: Mobility status  GO    01/16/2013 Cipriano Mile OTR/L Pager [phone] Office [phone]  Cipriano Mile 01/16/2013, 9:29 AM

## 2013-01-16 NOTE — Progress Notes (Signed)
Subjective: 3 Days Post-Op Procedure(s) (LRB): POSTERIOR LUMBAR FUSION 1 LEVEL (Right) Patient reports pain as 2 on 0-10 scale.    Objective: Vital signs in last 24 hours: Temp:  [97.9 F (36.6 C)-99 F (37.2 C)] 99 F (37.2 C) (09/21 0656) Pulse Rate:  [74-94] 94 (09/21 0656) Resp:  [14-18] 18 (09/21 0656) BP: (121-129)/(43-50) 127/50 mmHg (09/21 0656) SpO2:  [98 %-99 %] 99 % (09/21 0656)  Intake/Output from previous day: 09/20 0701 - 09/21 0700 In: 2043 [P.O.:2040; I.V.:3] Out: 0  Intake/Output this shift:     Recent Labs  01/13/13 1012  HGB 11.0*    Recent Labs  01/13/13 1012  WBC 7.1  RBC 3.54*  HCT 32.7*  PLT 257    Recent Labs  01/13/13 1012  NA 137  K 4.2  CL 101  CO2 26  BUN 18  CREATININE 0.86  GLUCOSE 95  CALCIUM 10.3    Recent Labs  01/13/13 1012  INR 0.98    Neurologically intact ABD soft Neurovascular intact Sensation intact distally Intact pulses distally Dorsiflexion/Plantar flexion intact Incision: moderate drainage No cellulitis present  Assessment/Plan: 3 Days Post-Op Procedure(s) (LRB): POSTERIOR LUMBAR FUSION 1 LEVEL (Right), change dressing today, discharge after completing physical therapy, continue back precautions, or a TLSO brace at all times, followup with Dr. Yevette Edwards on Friday this week. Discharge home with home health  [name] 01/16/2013, 8:31 AM

## 2013-04-04 ENCOUNTER — Other Ambulatory Visit: Payer: Self-pay | Admitting: Internal Medicine

## 2013-04-04 ENCOUNTER — Ambulatory Visit (INDEPENDENT_AMBULATORY_CARE_PROVIDER_SITE_OTHER): Payer: Medicare Other | Admitting: Gastroenterology

## 2013-04-04 VITALS — BP 133/73 | HR 70 | Temp 97.5°F | Ht 65.5 in | Wt 174.8 lb

## 2013-04-04 DIAGNOSIS — K219 Gastro-esophageal reflux disease without esophagitis: Secondary | ICD-10-CM

## 2013-04-04 DIAGNOSIS — R1319 Other dysphagia: Secondary | ICD-10-CM

## 2013-04-04 DIAGNOSIS — R1310 Dysphagia, unspecified: Secondary | ICD-10-CM | POA: Insufficient documentation

## 2013-04-04 DIAGNOSIS — D649 Anemia, unspecified: Secondary | ICD-10-CM

## 2013-04-04 DIAGNOSIS — K5900 Constipation, unspecified: Secondary | ICD-10-CM | POA: Insufficient documentation

## 2013-04-04 DIAGNOSIS — R1314 Dysphagia, pharyngoesophageal phase: Secondary | ICD-10-CM

## 2013-04-04 DIAGNOSIS — R945 Abnormal results of liver function studies: Secondary | ICD-10-CM | POA: Insufficient documentation

## 2013-04-04 DIAGNOSIS — R194 Change in bowel habit: Secondary | ICD-10-CM

## 2013-04-04 DIAGNOSIS — R7989 Other specified abnormal findings of blood chemistry: Secondary | ICD-10-CM

## 2013-04-04 DIAGNOSIS — R198 Other specified symptoms and signs involving the digestive system and abdomen: Secondary | ICD-10-CM

## 2013-04-04 LAB — CBC WITH DIFFERENTIAL/PLATELET
Basophils Absolute: 0 10*3/uL (ref 0.0–0.1)
Basophils Relative: 0 % (ref 0–1)
Eosinophils Absolute: 0.3 10*3/uL (ref 0.0–0.7)
Eosinophils Relative: 3 % (ref 0–5)
HCT: 33.7 % — ABNORMAL LOW (ref 36.0–46.0)
Hemoglobin: 11.7 g/dL — ABNORMAL LOW (ref 12.0–15.0)
Lymphocytes Relative: 55 % — ABNORMAL HIGH (ref 12–46)
Lymphs Abs: 5.5 10*3/uL — ABNORMAL HIGH (ref 0.7–4.0)
MCH: 30 pg (ref 26.0–34.0)
MCHC: 34.7 g/dL (ref 30.0–36.0)
MCV: 86.4 fL (ref 78.0–100.0)
Monocytes Absolute: 0.8 10*3/uL (ref 0.1–1.0)
Monocytes Relative: 8 % (ref 3–12)
Neutro Abs: 3.3 10*3/uL (ref 1.7–7.7)
Neutrophils Relative %: 34 % — ABNORMAL LOW (ref 43–77)
Platelets: 326 10*3/uL (ref 150–400)
RBC: 3.9 MIL/uL (ref 3.87–5.11)
RDW: 13.6 % (ref 11.5–15.5)
WBC: 10 10*3/uL (ref 4.0–10.5)

## 2013-04-04 LAB — HEPATIC FUNCTION PANEL
ALT: 59 U/L — ABNORMAL HIGH (ref 0–35)
AST: 97 U/L — ABNORMAL HIGH (ref 0–37)
Albumin: 4.4 g/dL (ref 3.5–5.2)
Alkaline Phosphatase: 92 U/L (ref 39–117)
Bilirubin, Direct: 0.1 mg/dL (ref 0.0–0.3)
Indirect Bilirubin: 0.2 mg/dL (ref 0.0–0.9)
Total Bilirubin: 0.3 mg/dL (ref 0.3–1.2)
Total Protein: 7.4 g/dL (ref 6.0–8.3)

## 2013-04-04 MED ORDER — LINACLOTIDE 145 MCG PO CAPS: 145.0000 ug | ORAL_CAPSULE | Freq: Every day | ORAL | Status: DC

## 2013-04-04 MED ORDER — OMEPRAZOLE 20 MG PO CPDR: 20.0000 mg | DELAYED_RELEASE_CAPSULE | Freq: Every day | ORAL | Status: DC

## 2013-04-04 MED ORDER — PEG 3350-KCL-NA BICARB-NACL 420 G PO SOLR: 4000.0000 mL | ORAL | Status: DC

## 2013-04-04 NOTE — Assessment & Plan Note (Signed)
Followup on today.

## 2013-04-04 NOTE — Progress Notes (Signed)
Primary Care Physician:  HALL, ZACH, MD  Primary Gastroenterologist:  Michael Rourk, MD   Chief Complaint  Patient presents with  . Dysphagia  . Constipation    HPI:  [name] is a 72 y.o. female here for further evaluation of esophageal dysphagia and change in bowel habits. The past several weeks she has noted difficulty swallowing. Feels cramping pain in throat area with swallowing food. Resolves after she feels the food go down all the way. She has had episodes where the food becomes lodged in her husband performed the Heimlich maneuver on her. States she cannot breathe at these times. Scared to eat by herself. Several episodes where husband has had to do Heimlich maneuver. Reflux has been real bad over last several weeks. Just started back on prilosec OTC once daily, has been on 14 days so far, no improvement in heartburn or swallowing issues. Had been off for years. Pills seem to go down okay. BM less regular. Was going every day. Now has to use suppository to go. Takes every 3rd day. Stools are very hard. No brbpr, melena. Feels like she has incomplete  evacuation. Lots of lower abdominal cramping with it. Now on pain medications for past few months since her back surgery. She is in the process of weaning off. No NSAIDS/ASA. Noted to have slightly abnormal AST/ALT and hemoglobin prior to her surgery.  Current Outpatient Prescriptions  Medication Sig Dispense Refill  . HYDROcodone-acetaminophen (NORCO) 7.5-325 MG per tablet Take 1 tablet by mouth every 4 (four) hours as needed.       . omeprazole (PRILOSEC OTC) 20 MG tablet Take 20 mg by mouth daily.      . Probiotic Product (ALIGN) 4 MG CAPS Take 4 mg by mouth daily.      . gabapentin (NEURONTIN) 300 MG capsule Take 1 capsule (300 mg total) by mouth at bedtime.  30 capsule  0   No current facility-administered medications for this visit.    Allergies as of 04/04/2013 - Review Complete 04/04/2013  Allergen Reaction Noted  .  Aspirin Other (See Comments)   . Penicillins Swelling and Other (See Comments)     Past Medical History  Diagnosis Date  . Coronary atherosclerosis of native coronary artery     Nonobstructive at catheterization 2008 - 40% circumflex  . Environmental allergies   . Shortness of breath     with exertion  . History of bronchitis     >12yrs ago  . Arthritis     back  . History of migraine     last time a few months ago  . Chronic back pain   . Bruises easily   . History of colon polyps   . Urinary frequency   . Nocturia   . Urinary urgency   . Panic attacks     no meds required  . Insomnia     doesn't take any meds  . Seasonal allergies   . GERD (gastroesophageal reflux disease)     doesn't take any meds from this.  Takes tums as neeeded.  . Breast cancer     Right mastectomy    Past Surgical History  Procedure Laterality Date  . Abdominal hysterectomy    . Cholecystectomy    . Bladder suspension    . Back surgery  1981/2011/2014  . Breast surgery Right   . Colonoscopy  12/2009    RMR: few pancolonic diverticula. next TCS 12/2014  . Esophagogastroduodenoscopy  06/14/2007    RMR:Ribbon-like   plaques in esophageal mucosa of uncertain significance brushed and biopsied/Normal stomach, normal first duodenum and second duodenoscopy. KOH negative. esophageal bx c/w GERD  . Foot surgery Right 2014    d/t plantar fascitis  . Breast reconstruction  2004    with abd tissue  . Eye surgery      lasik  . Colonoscopy  06/14/2007    RMR:Diminutive rectal polyp, status post cold biopsy removal/ Anal canal hemorrhoids/Left-sided diverticula Colonic mucosa appeared normal.Hyperplastic polyp.   . Colonoscopy  01/18/2004    RMR:Sigmoid diverticula/Internal hemorrhoids.  Otherwise normal rectum    Family History  Problem Relation Age of Onset  . Coronary artery disease Mother   . Coronary artery disease Father   . Coronary artery disease Brother   . Cancer Sister     lung cancer,  two sisters  . Colon cancer Brother     less than age 60    History   Social History  . Marital Status: Married    Spouse Name: N/A    Number of Children: N/A  . Years of Education: N/A   Occupational History  . Not on file.   Social History Main Topics  . Smoking status: Former Smoker    Types: Cigarettes  . Smokeless tobacco: Not on file     Comment: quit about 12yrs ago  . Alcohol Use: No  . Drug Use: No  . Sexual Activity: Not Currently   Other Topics Concern  . Not on file   Social History Narrative  . No narrative on file      ROS:  General: Negative for anorexia, weight loss, fever, chills, fatigue, weakness. Eyes: Negative for vision changes.  ENT: Negative for hoarseness, difficulty swallowing , nasal congestion. CV: Negative for chest pain, angina, palpitations, dyspnea on exertion, peripheral edema.  Respiratory: Negative for dyspnea at rest, dyspnea on exertion, cough, sputum, wheezing.  GI: See history of present illness. GU:  Negative for dysuria, hematuria, urinary incontinence, urinary frequency, nocturnal urination.  MS: Negative for joint pain. Chronic but improved low back pain since back surgery September 2014.  Derm: Negative for rash or itching.  Neuro: Negative for weakness, abnormal sensation, seizure, frequent headaches, memory loss, confusion.  Psych: Negative for anxiety, depression, suicidal ideation, hallucinations.  Endo: Negative for unusual weight change.  Heme: Negative for bruising or bleeding. Allergy: Negative for rash or hives.    Physical Examination:  BP 133/73  Pulse 70  Temp(Src) 97.5 F (36.4 C) (Oral)  Ht 5' 5.5" (1.664 m)  Wt 174 lb 12.8 oz (79.289 kg)  BMI 28.64 kg/m2   General: Well-nourished, well-developed in no acute distress.  Head: Normocephalic, atraumatic.   Eyes: Conjunctiva pink, no icterus. Mouth: Oropharyngeal mucosa moist and pink , no lesions erythema or exudate. Neck: Supple without  thyromegaly, masses, or lymphadenopathy.  Lungs: Clear to auscultation bilaterally.  Heart: Regular rate and rhythm, no murmurs rubs or gallops.  Abdomen: Bowel sounds are normal, mild central lower abdominal tenderness to deep palpation, nondistended, no hepatosplenomegaly or masses, no abdominal bruits or    hernia , no rebound or guarding.   Rectal: Deferred Extremities: No lower extremity edema. No clubbing or deformities.  Neuro: Alert and oriented x 4 , grossly normal neurologically.  Skin: Warm and dry, no rash or jaundice.   Psych: Alert and cooperative, normal mood and affect.  Labs: Lab Results  Component Value Date   WBC 7.1 01/13/2013   HGB 11.0* 01/13/2013   HCT 32.7* 01/13/2013     MCV 92.4 01/13/2013   PLT 257 01/13/2013   Lab Results  Component Value Date   ALT 54* 01/13/2013   AST 54* 01/13/2013   ALKPHOS 77 01/13/2013   BILITOT 0.2* 01/13/2013     Imaging Studies: No results found.    

## 2013-04-04 NOTE — Patient Instructions (Signed)
1. Please have your bloodwork done. 2. Stop over-the-counter Prilosec. Start omeprazole 20 mg daily before breakfast. Prescription sent to pharmacy. 3. Start Linzess daily for constipation. Take on empty stomach. Prescription sent to your pharmacy. 4. EGD with dilation and colonoscopy scheduled. Please see separate instructions.

## 2013-04-04 NOTE — Assessment & Plan Note (Signed)
Several month history of change in bowel habits. Requires suppository in order have a bowel movement. Denies having spontaneous bowel movements at this point. History of colon polyps and family history of colon cancer at an early age. Last colonoscopy 3 years ago with adequate bowel preparation. Discussed with patient that her constipation is likely related to pain medication requirements for her back issues. She does have history of mild normocytic anemia and plans to repeat CBC. She is quite insistent on pursuing followup colonoscopy at this time which is not entirely unreasonable.  I have discussed the risks, alternatives, benefits with regards to but not limited to the risk of reaction to medication, bleeding, infection, perforation and the patient is agreeable to proceed. Written consent to be obtained.  For constipation she will start Linzess daily. Voucher provided.

## 2013-04-04 NOTE — Assessment & Plan Note (Signed)
72 year old lady who presents with several week history of solid food esophageal dysphagia associated with what sounds like significant impaction on several episodes. Complains of crampy like pain in the throat area when she swallows, improves after the food goes down. Notes recurrent heartburn associated with these symptoms. Had been off PPI for several years secondary to quiesced and disease after she stop smoking. It does persist despite resuming Prilosec OTC. Recommend upper endoscopy with dilation in the near future.  I have discussed the risks, alternatives, benefits with regards to but not limited to the risk of reaction to medication, bleeding, infection, perforation and the patient is agreeable to proceed. Written consent to be obtained.  Omeprazole 20mg  daily. RX provided. Soft mechanical diet until procedure. Discussed with patient.

## 2013-04-04 NOTE — Progress Notes (Signed)
cc'd to pcp 

## 2013-04-04 NOTE — Assessment & Plan Note (Signed)
Recheck today. 

## 2013-04-05 ENCOUNTER — Other Ambulatory Visit: Payer: Self-pay | Admitting: Gastroenterology

## 2013-04-05 DIAGNOSIS — R945 Abnormal results of liver function studies: Secondary | ICD-10-CM

## 2013-04-07 ENCOUNTER — Encounter (HOSPITAL_COMMUNITY): Payer: Self-pay | Admitting: Pharmacy Technician

## 2013-04-08 ENCOUNTER — Ambulatory Visit (HOSPITAL_COMMUNITY)
Admission: RE | Admit: 2013-04-08 | Discharge: 2013-04-08 | Disposition: A | Discharge status: Home / Self Care | Payer: Medicare Other | Source: Ambulatory Visit | Attending: Gastroenterology | Admitting: Gastroenterology

## 2013-04-08 DIAGNOSIS — R7989 Other specified abnormal findings of blood chemistry: Secondary | ICD-10-CM | POA: Insufficient documentation

## 2013-04-08 DIAGNOSIS — R932 Abnormal findings on diagnostic imaging of liver and biliary tract: Secondary | ICD-10-CM | POA: Insufficient documentation

## 2013-04-08 DIAGNOSIS — R945 Abnormal results of liver function studies: Secondary | ICD-10-CM

## 2013-04-12 ENCOUNTER — Other Ambulatory Visit: Payer: Self-pay | Admitting: Gastroenterology

## 2013-04-12 ENCOUNTER — Other Ambulatory Visit: Payer: Self-pay

## 2013-04-12 DIAGNOSIS — D649 Anemia, unspecified: Secondary | ICD-10-CM

## 2013-04-12 DIAGNOSIS — R945 Abnormal results of liver function studies: Secondary | ICD-10-CM

## 2013-04-13 ENCOUNTER — Other Ambulatory Visit: Payer: Self-pay | Admitting: Gastroenterology

## 2013-04-13 DIAGNOSIS — K838 Other specified diseases of biliary tract: Secondary | ICD-10-CM

## 2013-04-13 LAB — HEPATITIS B SURFACE ANTIGEN: Hepatitis B Surface Ag: NEGATIVE

## 2013-04-13 LAB — HEPATITIS C ANTIBODY: HCV Ab: NEGATIVE

## 2013-04-13 LAB — IRON AND TIBC
%SAT: 30 % (ref 20–55)
Iron: 96 ug/dL (ref 42–145)
TIBC: 318 ug/dL (ref 250–470)
UIBC: 222 ug/dL (ref 125–400)

## 2013-04-13 LAB — FERRITIN: Ferritin: 125 ng/mL (ref 10–291)

## 2013-04-14 LAB — CREATININE, SERUM: Creat: 0.95 mg/dL (ref 0.50–1.10)

## 2013-04-18 ENCOUNTER — Encounter (HOSPITAL_COMMUNITY)
Admission: RE | Disposition: A | Discharge status: Home / Self Care | Payer: Self-pay | Source: Ambulatory Visit | Attending: Internal Medicine

## 2013-04-18 ENCOUNTER — Ambulatory Visit (HOSPITAL_COMMUNITY)
Admission: RE | Admit: 2013-04-18 | Discharge: 2013-04-18 | Disposition: A | Discharge status: Home / Self Care | Payer: Medicare Other | Source: Ambulatory Visit | Attending: Internal Medicine | Admitting: Internal Medicine

## 2013-04-18 ENCOUNTER — Encounter (HOSPITAL_COMMUNITY): Payer: Self-pay | Admitting: *Deleted

## 2013-04-18 DIAGNOSIS — K5730 Diverticulosis of large intestine without perforation or abscess without bleeding: Secondary | ICD-10-CM

## 2013-04-18 DIAGNOSIS — K219 Gastro-esophageal reflux disease without esophagitis: Secondary | ICD-10-CM | POA: Insufficient documentation

## 2013-04-18 DIAGNOSIS — R198 Other specified symptoms and signs involving the digestive system and abdomen: Secondary | ICD-10-CM

## 2013-04-18 DIAGNOSIS — R1310 Dysphagia, unspecified: Secondary | ICD-10-CM

## 2013-04-18 DIAGNOSIS — R1319 Other dysphagia: Secondary | ICD-10-CM

## 2013-04-18 DIAGNOSIS — K5900 Constipation, unspecified: Secondary | ICD-10-CM

## 2013-04-18 DIAGNOSIS — D649 Anemia, unspecified: Secondary | ICD-10-CM

## 2013-04-18 DIAGNOSIS — R194 Change in bowel habit: Secondary | ICD-10-CM

## 2013-04-18 HISTORY — PX: COLONOSCOPY: SHX5424

## 2013-04-18 HISTORY — PX: ESOPHAGOGASTRODUODENOSCOPY (EGD) WITH ESOPHAGEAL DILATION: SHX5812

## 2013-04-18 SURGERY — COLONOSCOPY
Anesthesia: Moderate Sedation

## 2013-04-18 MED ORDER — MIDAZOLAM HCL 5 MG/5ML IJ SOLN
INTRAMUSCULAR | Status: AC
  Filled 2013-04-18: qty 10

## 2013-04-18 MED ORDER — MEPERIDINE HCL 100 MG/ML IJ SOLN
INTRAMUSCULAR | Status: AC
  Filled 2013-04-18: qty 2

## 2013-04-18 MED ORDER — BUTAMBEN-TETRACAINE-BENZOCAINE 2-2-14 % EX AERO
INHALATION_SPRAY | CUTANEOUS | Status: DC | PRN
  Administered 2013-04-18: 2 via TOPICAL

## 2013-04-18 MED ORDER — ONDANSETRON HCL 4 MG/2ML IJ SOLN
INTRAMUSCULAR | Status: DC | PRN
  Administered 2013-04-18: 4 mg via INTRAVENOUS

## 2013-04-18 MED ORDER — MIDAZOLAM HCL 5 MG/5ML IJ SOLN
INTRAMUSCULAR | Status: DC | PRN
  Administered 2013-04-18: 1 mg via INTRAVENOUS
  Administered 2013-04-18: 2 mg via INTRAVENOUS
  Administered 2013-04-18 (×2): 1 mg via INTRAVENOUS
  Administered 2013-04-18: 2 mg via INTRAVENOUS
  Administered 2013-04-18: 1 mg via INTRAVENOUS

## 2013-04-18 MED ORDER — SODIUM CHLORIDE 0.9 % IV SOLN
INTRAVENOUS | Status: DC
  Administered 2013-04-18: 1000 mL via INTRAVENOUS

## 2013-04-18 MED ORDER — ONDANSETRON HCL 4 MG/2ML IJ SOLN
INTRAMUSCULAR | Status: AC
  Filled 2013-04-18: qty 2

## 2013-04-18 MED ORDER — MEPERIDINE HCL 100 MG/ML IJ SOLN
INTRAMUSCULAR | Status: DC | PRN
  Administered 2013-04-18: 25 mg via INTRAVENOUS
  Administered 2013-04-18: 50 mg via INTRAVENOUS
  Administered 2013-04-18: 25 mg via INTRAVENOUS

## 2013-04-18 MED ORDER — STERILE WATER FOR IRRIGATION IR SOLN
Status: DC | PRN
  Administered 2013-04-18: 10:00:00

## 2013-04-18 NOTE — Op Note (Signed)
[facility] [address] [place], [address]   COLONOSCOPY PROCEDURE REPORT  PATIENT: [name], [name]  MR#:         [id number] BIRTHDATE: [birth year] , 72  yrs. old GENDER: Female ENDOSCOPIST: R.  Roetta Sessions, MD FACP FACG REFERRED BY:  Catalina Pizza, M.D. PROCEDURE DATE:  04/18/2013 PROCEDURE:     Colonoscopy-diagnostic  INDICATIONS: Change in bowel habits/constipation  INFORMED CONSENT:  The risks, benefits, alternatives and imponderables including but not limited to bleeding, perforation as well as the possibility of a missed lesion have been reviewed.  The potential for biopsy, lesion removal, etc. have also been discussed.  Questions have been answered.  All parties agreeable. Please see the history and physical in the medical record for more information.  MEDICATIONS: Versed 8 mg IV and Demerol 100 mg IV in divided doses. Zofran 4 mg IV  DESCRIPTION OF PROCEDURE:  After a digital rectal exam was performed, the EC-[id number] ([id number])  colonoscope was advanced from the anus through the rectum and colon to the area of the cecum, ileocecal valve and appendiceal orifice.  The cecum was deeply intubated.  These structures were well-seen and photographed for the record.  From the level of the cecum and ileocecal valve, the scope was slowly and cautiously withdrawn.  The mucosal surfaces were carefully surveyed utilizing scope tip deflection to facilitate fold flattening as needed.  The scope was pulled down into the rectum where a thorough examination including retroflexion was performed.    FINDINGS:  Adequate preparation. Normal rectum. Scattered left-sided diverticula; the remainder of colonic mucosa appeared normal  THERAPEUTIC / DIAGNOSTIC MANEUVERS PERFORMED:  None  COMPLICATIONS: None  CECAL WITHDRAWAL TIME:  7 minute  IMPRESSION:  Colonic diverticulosis   RECOMMENDATIONS: Begin Benefiber 2 teaspoons twice daily. MiraLax 17 g orally at bedtime as  needed for constipation. Stop Linzess; Repeat colonoscopy in 5 years   _______________________________ eSigned:  R. Roetta Sessions, MD FACP [facility] 04/18/2013 10:53 AM   CC:

## 2013-04-18 NOTE — Op Note (Signed)
[facility] [address] [place], [address]   ENDOSCOPY PROCEDURE REPORT  PATIENT: [name], [name]  MR#: [id number] BIRTHDATE: 06/21/1940 , 72  yrs. old GENDER: Female ENDOSCOPIST: R.  Roetta Sessions, MD FACP FACG REFERRED BY:  Catalina Pizza, M.D. PROCEDURE DATE:  04/18/2013 PROCEDURE:     EGD with Elease Hashimoto dilation  INDICATIONS:      GERD; esophageal dysphagia  INFORMED CONSENT:   The risks, benefits, limitations, alternatives and imponderables have been discussed.  The potential for biopsy, esophogeal dilation, etc. have also been reviewed.  Questions have been answered.  All parties agreeable.  Please see the history and physical in the medical record for more information.  MEDICATIONS: Versed 6 mg IV and Demerol 100 mg IV in divided doses. Cetacaine spray. Zofran 4 mg IV  DESCRIPTION OF PROCEDURE:   The [id number]-[id number] ([id number])  endoscope was introduced through the mouth and advanced to the second portion of the duodenum without difficulty or limitations.  The mucosal surfaces were surveyed very carefully during advancement of the scope and upon withdrawal.  Retroflexion view of the proximal stomach and esophagogastric junction was performed.      FINDINGS:  Normal, patent appearing, tubular esophagus. Stomach empty. Normal gastric mucosa. Patent pylorus. Normal first and second portion of the duodenum  THERAPEUTIC / DIAGNOSTIC MANEUVERS PERFORMED:  A 54 French Maloney dilator was passed to full insertion easily. A look back revealed no apparent complication related to this maneuver.   COMPLICATIONS:  None  IMPRESSION:  Normal EGD-status post passage of a Maloney dilator  RECOMMENDATIONS:   Continue Prilosec 20 mg daily. See colonoscopy report    _______________________________ R. Roetta Sessions, MD FACP [facility] eSigned:  R. Roetta Sessions, MD FACP [facility] 04/18/2013 10:30 AM     CC:

## 2013-04-18 NOTE — Interval H&P Note (Signed)
History and Physical Interval Note:  04/18/2013 10:03 AM  [name]  has presented today for surgery, with the diagnosis of ESOPHAGEAL DYSPHAGIA, ANEMIA  AND CHANGE IN BOWELS  The various methods of treatment have been discussed with the patient and family. After consideration of risks, benefits and other options for treatment, the patient has consented to  Procedure(s) with comments: COLONOSCOPY (N/A) - 10:30 ESOPHAGOGASTRODUODENOSCOPY (EGD) WITH ESOPHAGEAL DILATION (N/A) as a surgical intervention .  The patient's history has been reviewed, patient examined, no change in status, stable for surgery.  I have reviewed the patient's chart and labs.  Questions were answered to the patient's satisfaction.    Linzess has not helped for constipation. Otherwise no change.   EGD with esophageal dilation and colonoscopy per plan.     The risks, benefits, limitations, imponderables and alternatives regarding both EGD and colonoscopy have been reviewed with the patient. Questions have been answered. All parties agreeable.    Eula Listen

## 2013-04-18 NOTE — H&P (View-Only) (Signed)
Primary Care Physician:  Catalina Pizza, MD  Primary Gastroenterologist:  Roetta Sessions, MD   Chief Complaint  Patient presents with  . Dysphagia  . Constipation    HPI:  [name] is a 72 y.o. female here for further evaluation of esophageal dysphagia and change in bowel habits. The past several weeks she has noted difficulty swallowing. Feels cramping pain in throat area with swallowing food. Resolves after she feels the food go down all the way. She has had episodes where the food becomes lodged in her husband performed the Heimlich maneuver on her. States she cannot breathe at these times. Scared to eat by herself. Several episodes where husband has had to do Heimlich maneuver. Reflux has been real bad over last several weeks. Just started back on prilosec OTC once daily, has been on 14 days so far, no improvement in heartburn or swallowing issues. Had been off for years. Pills seem to go down okay. BM less regular. Was going every day. Now has to use suppository to go. Takes every 3rd day. Stools are very hard. No brbpr, melena. Feels like she has incomplete  evacuation. Lots of lower abdominal cramping with it. Now on pain medications for past few months since her back surgery. She is in the process of weaning off. No NSAIDS/ASA. Noted to have slightly abnormal AST/ALT and hemoglobin prior to her surgery.  Current Outpatient Prescriptions  Medication Sig Dispense Refill  . HYDROcodone-acetaminophen (NORCO) 7.5-325 MG per tablet Take 1 tablet by mouth every 4 (four) hours as needed.       Marland Kitchen omeprazole (PRILOSEC OTC) 20 MG tablet Take 20 mg by mouth daily.      . Probiotic Product (ALIGN) 4 MG CAPS Take 4 mg by mouth daily.      Marland Kitchen gabapentin (NEURONTIN) 300 MG capsule Take 1 capsule (300 mg total) by mouth at bedtime.  30 capsule  0   No current facility-administered medications for this visit.    Allergies as of 04/04/2013 - Review Complete 04/04/2013  Allergen Reaction Noted  .  Aspirin Other (See Comments)   . Penicillins Swelling and Other (See Comments)     Past Medical History  Diagnosis Date  . Coronary atherosclerosis of native coronary artery     Nonobstructive at catheterization 2008 - 40% circumflex  . Environmental allergies   . Shortness of breath     with exertion  . History of bronchitis     >18yrs ago  . Arthritis     back  . History of migraine     last time a few months ago  . Chronic back pain   . Bruises easily   . History of colon polyps   . Urinary frequency   . Nocturia   . Urinary urgency   . Panic attacks     no meds required  . Insomnia     doesn't take any meds  . Seasonal allergies   . GERD (gastroesophageal reflux disease)     doesn't take any meds from this.  Takes tums as neeeded.  . Breast cancer     Right mastectomy    Past Surgical History  Procedure Laterality Date  . Abdominal hysterectomy    . Cholecystectomy    . Bladder suspension    . Back surgery  1981/2011/2014  . Breast surgery Right   . Colonoscopy  12/2009    RMR: few pancolonic diverticula. next TCS 12/2014  . Esophagogastroduodenoscopy  06/14/2007    [id number]  plaques in esophageal mucosa of uncertain significance brushed and biopsied/Normal stomach, normal first duodenum and second duodenoscopy. KOH negative. esophageal bx c/w GERD  . Foot surgery Right 2014    d/t plantar fascitis  . Breast reconstruction  2004    with abd tissue  . Eye surgery      lasik  . Colonoscopy  06/14/2007    [id number] rectal polyp, status post cold biopsy removal/ Anal canal hemorrhoids/Left-sided diverticula Colonic mucosa appeared normal.Hyperplastic polyp.   . Colonoscopy  01/18/2004    JXB:[id number] diverticula/Internal hemorrhoids.  Otherwise normal rectum    Family History  Problem Relation Age of Onset  . Coronary artery disease Mother   . Coronary artery disease Father   . Coronary artery disease Brother   . Cancer Sister     lung cancer,  two sisters  . Colon cancer Brother     less than age 11    History   Social History  . Marital Status: Married    Spouse Name: N/A    Number of Children: N/A  . Years of Education: N/A   Occupational History  . Not on file.   Social History Main Topics  . Smoking status: Former Smoker    Types: Cigarettes  . Smokeless tobacco: Not on file     Comment: quit about 26yrs ago  . Alcohol Use: No  . Drug Use: No  . Sexual Activity: Not Currently   Other Topics Concern  . Not on file   Social History Narrative  . No narrative on file      ROS:  General: Negative for anorexia, weight loss, fever, chills, fatigue, weakness. Eyes: Negative for vision changes.  ENT: Negative for hoarseness, difficulty swallowing , nasal congestion. CV: Negative for chest pain, angina, palpitations, dyspnea on exertion, peripheral edema.  Respiratory: Negative for dyspnea at rest, dyspnea on exertion, cough, sputum, wheezing.  GI: See history of present illness. GU:  Negative for dysuria, hematuria, urinary incontinence, urinary frequency, nocturnal urination.  MS: Negative for joint pain. Chronic but improved low back pain since back surgery September 2014.  Derm: Negative for rash or itching.  Neuro: Negative for weakness, abnormal sensation, seizure, frequent headaches, memory loss, confusion.  Psych: Negative for anxiety, depression, suicidal ideation, hallucinations.  Endo: Negative for unusual weight change.  Heme: Negative for bruising or bleeding. Allergy: Negative for rash or hives.    Physical Examination:  BP 133/73  Pulse 70  Temp(Src) 97.5 F (36.4 C) (Oral)  Ht 5' 5.5" (1.664 m)  Wt 174 lb 12.8 oz (79.289 kg)  BMI 28.64 kg/m2   General: Well-nourished, well-developed in no acute distress.  Head: Normocephalic, atraumatic.   Eyes: Conjunctiva pink, no icterus. Mouth: Oropharyngeal mucosa moist and pink , no lesions erythema or exudate. Neck: Supple without  thyromegaly, masses, or lymphadenopathy.  Lungs: Clear to auscultation bilaterally.  Heart: Regular rate and rhythm, no murmurs rubs or gallops.  Abdomen: Bowel sounds are normal, mild central lower abdominal tenderness to deep palpation, nondistended, no hepatosplenomegaly or masses, no abdominal bruits or    hernia , no rebound or guarding.   Rectal: Deferred Extremities: No lower extremity edema. No clubbing or deformities.  Neuro: Alert and oriented x 4 , grossly normal neurologically.  Skin: Warm and dry, no rash or jaundice.   Psych: Alert and cooperative, normal mood and affect.  Labs: Lab Results  Component Value Date   WBC 7.1 01/13/2013   HGB 11.0* 01/13/2013   HCT 32.7* 01/13/2013  MCV 92.4 01/13/2013   PLT 257 01/13/2013   Lab Results  Component Value Date   ALT 54* 01/13/2013   AST 54* 01/13/2013   ALKPHOS 77 01/13/2013   BILITOT 0.2* 01/13/2013     Imaging Studies: No results found.

## 2013-04-19 ENCOUNTER — Ambulatory Visit (HOSPITAL_COMMUNITY)
Admission: RE | Admit: 2013-04-19 | Discharge: 2013-04-19 | Disposition: A | Discharge status: Home / Self Care | Payer: Medicare Other | Source: Ambulatory Visit | Attending: Gastroenterology | Admitting: Gastroenterology

## 2013-04-19 ENCOUNTER — Other Ambulatory Visit: Payer: Self-pay | Admitting: Gastroenterology

## 2013-04-19 DIAGNOSIS — K838 Other specified diseases of biliary tract: Secondary | ICD-10-CM | POA: Insufficient documentation

## 2013-04-19 DIAGNOSIS — K7689 Other specified diseases of liver: Secondary | ICD-10-CM | POA: Insufficient documentation

## 2013-04-19 DIAGNOSIS — R748 Abnormal levels of other serum enzymes: Secondary | ICD-10-CM | POA: Insufficient documentation

## 2013-04-19 MED ORDER — GADOBENATE DIMEGLUMINE 529 MG/ML IV SOLN
15.0000 mL | Freq: Once | INTRAVENOUS | Status: AC | PRN
  Administered 2013-04-19: 15 mL via INTRAVENOUS

## 2013-04-25 ENCOUNTER — Encounter (HOSPITAL_COMMUNITY): Payer: Self-pay | Admitting: Internal Medicine

## 2013-04-25 ENCOUNTER — Telehealth: Payer: Self-pay | Admitting: General Practice

## 2013-04-25 NOTE — Telephone Encounter (Signed)
Patient called and wanted her results from her MRI done on 12/23.  I explained to the patient our policy for receiving results, however she said she was very anxious and would like to get those results, if possible sometime today.

## 2013-04-25 NOTE — Telephone Encounter (Signed)
Pt is aware of her results.  

## 2013-04-25 NOTE — Telephone Encounter (Signed)
I addressed it yesterday from home. Nurse should have info in their inbasket.

## 2013-04-25 NOTE — Telephone Encounter (Signed)
Routing to Julie for follow-up 

## 2013-05-04 ENCOUNTER — Telehealth: Payer: Self-pay | Admitting: Internal Medicine

## 2013-05-04 DIAGNOSIS — R945 Abnormal results of liver function studies: Secondary | ICD-10-CM

## 2013-05-04 DIAGNOSIS — R7989 Other specified abnormal findings of blood chemistry: Secondary | ICD-10-CM

## 2013-05-04 NOTE — Telephone Encounter (Signed)
Please let patient know. Per Dr. Gala Romney, no further imaging needed. She has slightly more dilated bile duct since 2011 but likely benign and related to prior gb surgery/age. No evidence of bad stuff like tumor.  For abnormal AST/ALT. He recommends repeat in 6-8 weeks. Please arrange.

## 2013-05-04 NOTE — Telephone Encounter (Signed)
Pt is aware. She has not returned ifobt yet but will do that as soon as she can. Lab order on file.

## 2013-05-04 NOTE — Telephone Encounter (Signed)
Pt called to see if [facility] had addressed RMR about her lab work and would someone call her back because she is worried.

## 2013-05-27 ENCOUNTER — Other Ambulatory Visit (HOSPITAL_COMMUNITY): Payer: Self-pay | Admitting: Internal Medicine

## 2013-05-27 ENCOUNTER — Ambulatory Visit (HOSPITAL_COMMUNITY)
Admission: RE | Admit: 2013-05-27 | Discharge: 2013-05-27 | Disposition: A | Discharge status: Home / Self Care | Payer: Medicare Other | Source: Ambulatory Visit | Attending: Internal Medicine | Admitting: Internal Medicine

## 2013-05-27 DIAGNOSIS — M25531 Pain in right wrist: Secondary | ICD-10-CM

## 2013-05-27 DIAGNOSIS — M25539 Pain in unspecified wrist: Secondary | ICD-10-CM | POA: Insufficient documentation

## 2013-06-16 ENCOUNTER — Other Ambulatory Visit: Payer: Self-pay

## 2013-06-16 DIAGNOSIS — R945 Abnormal results of liver function studies: Secondary | ICD-10-CM

## 2013-06-16 DIAGNOSIS — R7989 Other specified abnormal findings of blood chemistry: Secondary | ICD-10-CM

## 2013-06-22 ENCOUNTER — Ambulatory Visit (INDEPENDENT_AMBULATORY_CARE_PROVIDER_SITE_OTHER): Payer: Medicare Other | Admitting: Gastroenterology

## 2013-06-22 DIAGNOSIS — D649 Anemia, unspecified: Secondary | ICD-10-CM

## 2013-06-22 LAB — HEPATIC FUNCTION PANEL
ALT: 22 U/L (ref 0–35)
AST: 33 U/L (ref 0–37)
Albumin: 4.2 g/dL (ref 3.5–5.2)
Alkaline Phosphatase: 73 U/L (ref 39–117)
Bilirubin, Direct: 0.1 mg/dL (ref 0.0–0.3)
Indirect Bilirubin: 0.3 mg/dL (ref 0.2–1.2)
Total Bilirubin: 0.4 mg/dL (ref 0.2–1.2)
Total Protein: 6.9 g/dL (ref 6.0–8.3)

## 2013-06-22 LAB — IFOBT (OCCULT BLOOD): IFOBT: POSITIVE

## 2013-06-22 NOTE — Progress Notes (Signed)
Pt return IFOBT test and it was POSITIVE 

## 2013-06-27 ENCOUNTER — Encounter (HOSPITAL_COMMUNITY): Payer: Self-pay | Admitting: Pharmacy Technician

## 2013-06-27 ENCOUNTER — Other Ambulatory Visit: Payer: Self-pay | Admitting: Internal Medicine

## 2013-06-28 ENCOUNTER — Telehealth: Payer: Self-pay | Admitting: Internal Medicine

## 2013-06-28 NOTE — Telephone Encounter (Signed)
NO PAC REQ FOR GIVENS CAPSULE STUDY PER [place] AT [facility]

## 2013-07-18 ENCOUNTER — Encounter (HOSPITAL_COMMUNITY): Payer: Self-pay

## 2013-07-18 ENCOUNTER — Ambulatory Visit (HOSPITAL_COMMUNITY)
Admission: RE | Admit: 2013-07-18 | Discharge: 2013-07-18 | Disposition: A | Discharge status: Home / Self Care | Payer: Medicare Other | Source: Ambulatory Visit | Attending: Internal Medicine | Admitting: Internal Medicine

## 2013-07-18 ENCOUNTER — Encounter (HOSPITAL_COMMUNITY)
Admission: RE | Disposition: A | Discharge status: Home / Self Care | Payer: Self-pay | Source: Ambulatory Visit | Attending: Internal Medicine

## 2013-07-18 DIAGNOSIS — D649 Anemia, unspecified: Secondary | ICD-10-CM | POA: Insufficient documentation

## 2013-07-18 DIAGNOSIS — K921 Melena: Secondary | ICD-10-CM | POA: Insufficient documentation

## 2013-07-18 HISTORY — PX: GIVENS CAPSULE STUDY: SHX5432

## 2013-07-18 SURGERY — IMAGING PROCEDURE, GI TRACT, INTRALUMINAL, VIA CAPSULE

## 2013-07-21 ENCOUNTER — Encounter (HOSPITAL_COMMUNITY): Payer: Self-pay | Admitting: Internal Medicine

## 2013-07-25 ENCOUNTER — Telehealth: Payer: Self-pay

## 2013-07-25 DIAGNOSIS — K2990 Gastroduodenitis, unspecified, without bleeding: Principal | ICD-10-CM

## 2013-07-25 DIAGNOSIS — Z139 Encounter for screening, unspecified: Secondary | ICD-10-CM

## 2013-07-25 DIAGNOSIS — K2970 Gastritis, unspecified, without bleeding: Secondary | ICD-10-CM

## 2013-07-25 NOTE — Telephone Encounter (Signed)
Waiting for review by Dr. Gala Romney. Hopefully final result available next 24 hours.

## 2013-07-25 NOTE — Telephone Encounter (Signed)
Pt is calling to see if the capsule study is back. I told I would ask and see. Please advise

## 2013-07-25 NOTE — Telephone Encounter (Signed)
Ask extender reviewing it

## 2013-07-27 ENCOUNTER — Other Ambulatory Visit: Payer: Self-pay

## 2013-07-27 ENCOUNTER — Encounter: Payer: Self-pay | Admitting: Gastroenterology

## 2013-07-27 ENCOUNTER — Other Ambulatory Visit: Payer: Self-pay | Admitting: Gastroenterology

## 2013-07-27 DIAGNOSIS — D649 Anemia, unspecified: Secondary | ICD-10-CM

## 2013-07-27 DIAGNOSIS — R198 Other specified symptoms and signs involving the digestive system and abdomen: Secondary | ICD-10-CM

## 2013-07-27 DIAGNOSIS — K2970 Gastritis, unspecified, without bleeding: Secondary | ICD-10-CM

## 2013-07-27 DIAGNOSIS — K2990 Gastroduodenitis, unspecified, without bleeding: Principal | ICD-10-CM

## 2013-07-27 DIAGNOSIS — K639 Disease of intestine, unspecified: Secondary | ICD-10-CM

## 2013-07-27 DIAGNOSIS — Z139 Encounter for screening, unspecified: Secondary | ICD-10-CM

## 2013-07-27 DIAGNOSIS — R195 Other fecal abnormalities: Secondary | ICD-10-CM

## 2013-07-27 MED ORDER — PANTOPRAZOLE SODIUM 40 MG PO TBEC: 40.0000 mg | DELAYED_RELEASE_TABLET | Freq: Every day | ORAL | Status: DC

## 2013-07-27 NOTE — Progress Notes (Signed)
Lab orders done and faxed to the lab.

## 2013-07-27 NOTE — Op Note (Signed)
  Small Bowel Givens Capsule Study Procedure date:  07/18/2013  Referring Provider: Garfield Cornea, MD PCP:  Delphina Cahill, MD    Indication for procedure:  Normocytic anemia, Hemoccult-positive stool, unexplained. EGD and colonoscopy December 2014 was unremarkable. She had a normal stomach, colonic diverticulosis.   Patient data:  Wt: 170 pounds Ht: 65 inches Waist: 36 inches  Findings:  Patient swallowed capsule without difficulty. This was a complete study demonstrated by capsule reaching the colon. Capsule was retained in the stomach for prolonged period of time. She had fairly fast transit of small bowel. Markedly abnormal appearing gastric mucosa. Diffuse erythema and some erosion, significant bile noted in the stomach. Multiple abnormal images, specifically at 10 minutes 11 seconds, 1 hour 44 minutes and 3 seconds, 3 hours 3 minutes and 59 seconds. No active bleeding noted. Small bowel actually looked good except for one area of questionable extrinsic compression versus submucosal mass. Likely insignificant finding.  First Gastric image:  1 minute 24 seconds First Duodenal image: 3 hours 52 minutes 13 seconds First Ileo-Cecal Valve image: 4 hours 46 minutes 37 seconds First Cecal image: 4 hours 46 minutes 38 seconds Gastric Passage time: 3 hours 50 minutes  Small Bowel Passage time:  54 minutes   Summary & Recommendations: Abnormal gastric mucosa as outlined above. Notably she had an entirely normal appearing stomach lining back in December at time of endoscopy. I spoke at length with the patient today. She has had some more recent onset epigastric discomfort, increased heartburn type symptoms. Omeprazole not helping. Denies any NSAID or aspirin use. Denies any episodes of heaving or vomiting.  Findings were discussed with Dr. Gala Romney. Images reviewed. Recommend checking H. pylori serologies. CT enterography to further evaluate possible small bowel mass versus extrinsic compression. I  will switch her PPI to pantoprazole 40 mg daily. Further recommendations to follow. We'll decide whether or not to pursue repeat endoscopy based on the above findings.  Attending note:  Pertinent images reviewed; agree with above assessment and plan.

## 2013-07-27 NOTE — Progress Notes (Unsigned)
Patient ID: [name], female   DOB: 02/22/1941, 73 y.o.   MRN: [id number]   Discussed with patient, results of small bowel capsule study. She had markedly abnormal appearing gastric mucosa, significant bile reflux as well. Patient denies any NSAIDs or aspirin products. Recently has had difficulty controlling her heartburn, omeprazole not helping. She's had increased epigastric pain. No vomiting.  Of note her upper endoscopy was normal back in December. I addressed this with Dr. Gala Romney. We will plan on H. pylori serologies. I am going to switch her PPI, prescription sent to [facility].  She also had questionable submucosal small bowel mass versus extrinsic compression in the distal small bowel. Dr. Gala Romney recommends CT enterography to further evaluate.  Patient complains of constipation. Advised to take MiraLax 17 g twice a day until regular bowel movements and once daily. She was unable to afford the co-pay for Linzess previously.  1. Please schedule CT enterography 2. Patient needs H. pylori serologies and serum creatinine.

## 2013-07-27 NOTE — Telephone Encounter (Signed)
I spoke with patient. Please see documentation note from today with instructions.

## 2013-07-27 NOTE — Telephone Encounter (Signed)
h pylori and creatinine Lab orders (per [facility] note)  done and faxed to lab

## 2013-07-27 NOTE — Progress Notes (Signed)
CT E is scheduled for Wednesday April 8th at 11:00 patient needs to be at [facility] at 9:30

## 2013-08-03 ENCOUNTER — Ambulatory Visit (HOSPITAL_COMMUNITY)
Admission: RE | Admit: 2013-08-03 | Discharge: 2013-08-03 | Disposition: A | Discharge status: Home / Self Care | Payer: Medicare Other | Source: Ambulatory Visit | Attending: Gastroenterology | Admitting: Gastroenterology

## 2013-08-03 DIAGNOSIS — R198 Other specified symptoms and signs involving the digestive system and abdomen: Secondary | ICD-10-CM

## 2013-08-03 DIAGNOSIS — Z853 Personal history of malignant neoplasm of breast: Secondary | ICD-10-CM | POA: Insufficient documentation

## 2013-08-03 DIAGNOSIS — R109 Unspecified abdominal pain: Secondary | ICD-10-CM | POA: Insufficient documentation

## 2013-08-03 DIAGNOSIS — D649 Anemia, unspecified: Secondary | ICD-10-CM | POA: Insufficient documentation

## 2013-08-03 LAB — POCT I-STAT CREATININE: Creatinine, Ser: 1 mg/dL (ref 0.50–1.10)

## 2013-08-03 MED ORDER — IOHEXOL 300 MG/ML  SOLN
100.0000 mL | Freq: Once | INTRAMUSCULAR | Status: AC | PRN
  Administered 2013-08-03: 120 mL via INTRAVENOUS

## 2013-08-04 LAB — CREATININE, SERUM: Creat: 0.94 mg/dL (ref 0.50–1.10)

## 2013-08-04 LAB — H. PYLORI ANTIBODY, IGG: H Pylori IgG: 0.76 {ISR}

## 2013-08-19 ENCOUNTER — Encounter: Payer: Self-pay | Admitting: Diagnostic Neuroimaging

## 2013-08-19 ENCOUNTER — Encounter (INDEPENDENT_AMBULATORY_CARE_PROVIDER_SITE_OTHER): Payer: Self-pay

## 2013-08-19 ENCOUNTER — Ambulatory Visit (INDEPENDENT_AMBULATORY_CARE_PROVIDER_SITE_OTHER): Payer: Medicare Other | Admitting: Diagnostic Neuroimaging

## 2013-08-19 VITALS — BP 152/78 | HR 64 | Ht 65.5 in | Wt 169.0 lb

## 2013-08-19 DIAGNOSIS — R209 Unspecified disturbances of skin sensation: Secondary | ICD-10-CM

## 2013-08-19 DIAGNOSIS — R531 Weakness: Secondary | ICD-10-CM

## 2013-08-19 DIAGNOSIS — G609 Hereditary and idiopathic neuropathy, unspecified: Secondary | ICD-10-CM

## 2013-08-19 DIAGNOSIS — R5383 Other fatigue: Secondary | ICD-10-CM

## 2013-08-19 DIAGNOSIS — R5381 Other malaise: Secondary | ICD-10-CM

## 2013-08-19 DIAGNOSIS — R200 Anesthesia of skin: Secondary | ICD-10-CM

## 2013-08-19 MED ORDER — GABAPENTIN 300 MG PO CAPS: 300.0000 mg | ORAL_CAPSULE | Freq: Three times a day (TID) | ORAL | Status: DC

## 2013-08-19 NOTE — Patient Instructions (Signed)
Increase gabepentin to 300mg  three times per day.  I will check lab testing and electrical test (EMG).

## 2013-08-19 NOTE — Progress Notes (Signed)
[facility]  PATIENT: [name] DOB: 01/06/1941  REFERRING CLINICIAN: Dumonski HISTORY FROM: patient  REASON FOR VISIT: new consult   HISTORICAL  CHIEF COMPLAINT:  Chief Complaint  Patient presents with  . Numbness    HISTORY OF PRESENT ILLNESS:   73 year old right-handed female here for evaluation of numbness and tingling and pain in the feet since 2010.  Patient has low back pain radiating to left leg in 2010 to present. Patient had surgeries in 2010, 2013 and 2014. Symptoms finally improved after 2014 surgery.  Injury 2015 patient developed new symptom of numbness and tingling in bilateral lower extremities, mainly from her toes to knees, with restless leg syndrome symptoms. Patient having more problems weakness, climbing stairs walking and balancing. Patient also having increased urinary urgency since 2014. Patient having numbness and tingling in the arms or the past 2-3 weeks. No symptoms in her face or head. No headache.  Patient has noticed development of high arches and hammertoes over past few years.  REVIEW OF SYSTEMS: Full 14 system review of systems performed and notable only for shortness of breath feeling hot blood in stool fatigue fevers and chills.  ALLERGIES: Allergies  Allergen Reactions  . Aspirin Other (See Comments)    Causes hemoptysis  . Penicillins Swelling and Other (See Comments)    blisters    HOME MEDICATIONS: Outpatient Prescriptions Prior to Visit  Medication Sig Dispense Refill  . pantoprazole (PROTONIX) 40 MG tablet Take 1 tablet (40 mg total) by mouth daily.  30 tablet  11  . gabapentin (NEURONTIN) 300 MG capsule Take 1 capsule (300 mg total) by mouth at bedtime.  30 capsule  0  . traMADol (ULTRAM) 50 MG tablet Take 50 mg by mouth 2 (two) times daily.       No facility-administered medications prior to visit.    PAST MEDICAL HISTORY: Past Medical History  Diagnosis Date  . Coronary atherosclerosis of  native coronary artery     Nonobstructive at catheterization 2008 - 40% circumflex  . Environmental allergies   . Shortness of breath     with exertion  . History of bronchitis     >24yrs ago  . Arthritis     back  . History of migraine     last time a few months ago  . Chronic back pain   . Bruises easily   . History of colon polyps   . Urinary frequency   . Nocturia   . Urinary urgency   . Panic attacks     no meds required  . Insomnia     doesn't take any meds  . Seasonal allergies   . GERD (gastroesophageal reflux disease)     doesn't take any meds from this.  Takes tums as neeeded.  . Breast cancer     Right mastectomy    PAST SURGICAL HISTORY: Past Surgical History  Procedure Laterality Date  . Abdominal hysterectomy    . Cholecystectomy    . Bladder suspension    . Back surgery  1981/2011/2014  . Breast surgery Right   . Colonoscopy  12/2009    RMR: few pancolonic diverticula. next TCS 12/2014  . Esophagogastroduodenoscopy  06/14/2007    YQI:[id number] plaques in esophageal mucosa of uncertain significance brushed and biopsied/Normal stomach, normal first duodenum and second duodenoscopy. KOH negative. esophageal bx c/w GERD  . Foot surgery Right 2014    d/t plantar fascitis  . Breast reconstruction  2004    with abd  tissue  . Eye surgery      lasik  . Colonoscopy  06/14/2007    OEU:[id number] rectal polyp, status post cold biopsy removal/ Anal canal hemorrhoids/Left-sided diverticula Colonic mucosa appeared normal.Hyperplastic polyp.   . Colonoscopy  01/18/2004    VQM:GQQPYPP diverticula/Internal hemorrhoids.  Otherwise normal rectum  . Colonoscopy N/A 04/18/2013    Procedure: COLONOSCOPY;  Surgeon: Daneil Dolin, MD;  [facility]: [facility];  Service: Endoscopy;  Laterality: N/A;  10:30  . Esophagogastroduodenoscopy (egd) with esophageal dilation N/A 04/18/2013    Procedure: ESOPHAGOGASTRODUODENOSCOPY (EGD) WITH ESOPHAGEAL DILATION;  Surgeon: Daneil Dolin, MD;  Location: [facility];  Service: Endoscopy;  Laterality: N/A;  . Givens capsule study N/A 07/18/2013    Procedure: GIVENS CAPSULE STUDY;  Surgeon: Daneil Dolin, MD;  Location: [facility];  Service: Endoscopy;  Laterality: N/A;  7:30    FAMILY HISTORY: Family History  Problem Relation Age of Onset  . Coronary artery disease Mother   . Coronary artery disease Father   . Coronary artery disease Brother   . Cancer Sister     lung cancer, two sisters  . Colon cancer Brother     less than age 53  . Lung cancer Sister   . Cancer Brother     SOCIAL HISTORY:  History   Social History  . Marital Status: Married    Spouse Name: [name]    Number of Children: 4  . Years of Education: 12th   Occupational History  .     Social History Main Topics  . Smoking status: Former Smoker -- 1.50 packs/day for 30 years    Types: Cigarettes    Quit date: 04/28/2000  . Smokeless tobacco: Never Used     Comment: quit about 6yrs ago  . Alcohol Use: No  . Drug Use: No  . Sexual Activity: Not Currently   Other Topics Concern  . Not on file   Social History Narrative   Patient lives at home with spouse.   Caffeine Use: 2 cups of coffee daily     PHYSICAL EXAM  Filed Vitals:   08/19/13 0923  BP: 152/78  Pulse: 64  Height: 5' 5.5" (1.664 m)  Weight: 169 lb (76.658 kg)    Not recorded    Body mass index is 27.69 kg/(m^2).  GENERAL EXAM: Patient is in no distress; well developed, nourished and groomed; neck is supple; PATIENT CHANGED INTO EXAM GOWN. FASCICULATIONS NOTED IN BILATERAL CALVES. BORDERLINE TONGUE FASCICULATIONS. NO FASCICULATIONS IN UPPER EXTREMITIES.  CARDIOVASCULAR: Regular rate and rhythm, no murmurs, no carotid bruits  NEUROLOGIC: MENTAL STATUS: awake, alert, oriented to person, place and time, recent and remote memory intact, normal attention and concentration, language fluent, comprehension intact, naming intact, fund of knowledge  appropriate CRANIAL NERVE: no papilledema on fundoscopic exam, pupils equal and reactive to light, visual fields full to confrontation, extraocular muscles intact, no nystagmus, facial sensation and strength symmetric, hearing intact, palate elevates symmetrically, uvula midline, shoulder shrug symmetric, tongue midline. MOTOR: normal bulk and tone; BUE (DELTOIDS 4+, OTHERWISE 5). BLE (HF 4, KE 5, KF 4, DF 4). HIGH ARCH AND HAMMERTOES NOTED BILATERALLY SENSORY: ABSENT VIB AT TOES AND ANKLES. DECR PP IN FEET UP TO KNEES. DECR TEMP IN FEET. COORDINATION: finger-nose-finger, fine finger movements normal REFLEXES: BUE 1, RIGHT KNEE 2, LEFT KNEE 1, ANKLES 0. DOWN GOING TOES GAIT/STATION: narrow based gait; SLOW, CAUTIOUS, HIGH STEPPING GAIT. CANNOT WALK ON TOES OR HEELS. CANNOT TANDEM. UNSTEADY. Romberg is  negative    DIAGNOSTIC DATA (LABS, IMAGING, TESTING) - I reviewed patient records, labs, notes, testing and imaging myself where available.  Lab Results  Component Value Date   WBC 10.0 04/04/2013   HGB 11.7* 04/04/2013   HCT 33.7* 04/04/2013   MCV 86.4 04/04/2013   PLT 326 04/04/2013      Component Value Date/Time   NA 137 01/13/2013 1012   K 4.2 01/13/2013 1012   CL 101 01/13/2013 1012   CO2 26 01/13/2013 1012   GLUCOSE 95 01/13/2013 1012   BUN 18 01/13/2013 1012   CREATININE 1.00 08/03/2013 0932   CREATININE 0.94 08/03/2013 0833   CALCIUM 10.3 01/13/2013 1012   PROT 6.9 06/22/2013 0855   ALBUMIN 4.2 06/22/2013 0855   AST 33 06/22/2013 0855   ALT 22 06/22/2013 0855   ALKPHOS 73 06/22/2013 0855   BILITOT 0.4 06/22/2013 0855   GFRNONAA 66* 01/13/2013 1012   GFRAA 76* 01/13/2013 1012   No results found for this basename: CHOL, HDL, LDLCALC, LDLDIRECT, TRIG, CHOLHDL   No results found for this basename: HGBA1C   No results found for this basename: VITAMINB12   Lab Results  Component Value Date   TSH 1.934 12/18/2009     ASSESSMENT AND PLAN  73 y.o. year old female here with history of  multiple low back surgeries, now with symm bilateral lower ext numbness, pain, weakness.   Ddx: polyneuropathy (metabolic, autoimmune, inflamm, nutritional, paraneoplastic, hereditary)   PLAN: - neuropathy workup - EMG/NCS - trial of gabapentin  Orders Placed This Encounter  Procedures  . Neuropathy Panel  . Vitamin B12  . TSH  . Hemoglobin A1c  . NCV with EMG(electromyography)   Meds ordered this encounter  Medications  . gabapentin (NEURONTIN) 300 MG capsule    Sig: Take 1 capsule (300 mg total) by mouth 3 (three) times daily.    Dispense:  90 capsule    Refill:  12   Return for EMG.    Penni Bombard, MD 0/63/0160, 10:93 AM Certified in Neurology, Neurophysiology and Neuroimaging  [facility] [address], [place], [place] [address] [phone]

## 2013-08-23 LAB — NEUROPATHY PANEL
A/G Ratio: 1.4 (ref 0.7–2.0)
Albumin ELP: 4.3 g/dL (ref 3.2–5.6)
Alpha 1: 0.2 g/dL (ref 0.1–0.4)
Alpha 2: 0.8 g/dL (ref 0.4–1.2)
Angio Convert Enzyme: 47 U/L (ref 14–82)
Anti Nuclear Antibody(ANA): NEGATIVE
Beta: 1 g/dL (ref 0.6–1.3)
Gamma Globulin: 1 g/dL (ref 0.5–1.6)
Globulin, Total: 3 g/dL (ref 2.0–4.5)
Rhuematoid fact SerPl-aCnc: 197.2 IU/mL — ABNORMAL HIGH (ref 0.0–13.9)
Sed Rate: 8 mm/hr (ref 0–40)
TSH: 1.34 u[IU]/mL (ref 0.450–4.500)
Total Protein: 7.3 g/dL (ref 6.0–8.5)
Vit D, 25-Hydroxy: 29.1 ng/mL — ABNORMAL LOW (ref 30.0–100.0)
Vitamin B-12: 407 pg/mL (ref 211–946)

## 2013-08-23 LAB — HEMOGLOBIN A1C
Est. average glucose Bld gHb Est-mCnc: 128 mg/dL
Hgb A1c MFr Bld: 6.1 % — ABNORMAL HIGH (ref 4.8–5.6)

## 2013-08-31 ENCOUNTER — Ambulatory Visit (INDEPENDENT_AMBULATORY_CARE_PROVIDER_SITE_OTHER): Payer: Medicare Other | Admitting: Diagnostic Neuroimaging

## 2013-08-31 ENCOUNTER — Encounter (INDEPENDENT_AMBULATORY_CARE_PROVIDER_SITE_OTHER): Payer: Self-pay | Admitting: Radiology

## 2013-08-31 DIAGNOSIS — R209 Unspecified disturbances of skin sensation: Secondary | ICD-10-CM

## 2013-08-31 DIAGNOSIS — R531 Weakness: Secondary | ICD-10-CM

## 2013-08-31 DIAGNOSIS — G589 Mononeuropathy, unspecified: Secondary | ICD-10-CM

## 2013-08-31 DIAGNOSIS — Z0289 Encounter for other administrative examinations: Secondary | ICD-10-CM

## 2013-08-31 DIAGNOSIS — G629 Polyneuropathy, unspecified: Secondary | ICD-10-CM

## 2013-08-31 DIAGNOSIS — R5381 Other malaise: Secondary | ICD-10-CM

## 2013-08-31 DIAGNOSIS — R200 Anesthesia of skin: Secondary | ICD-10-CM

## 2013-08-31 DIAGNOSIS — G609 Hereditary and idiopathic neuropathy, unspecified: Secondary | ICD-10-CM

## 2013-08-31 DIAGNOSIS — R5383 Other fatigue: Secondary | ICD-10-CM

## 2013-08-31 NOTE — Procedures (Signed)
   [facility]  NCS (NERVE CONDUCTION STUDY) WITH EMG (ELECTROMYOGRAPHY) REPORT   STUDY DATE: 08/31/13 PATIENT NAME: [name] DOB: 17-Apr-1941 MRN: [id number]  ORDERING CLINICIAN: Andrey Spearman, MD   TECHNOLOGIST: Towana Badger  ELECTROMYOGRAPHER: Earlean Polka. Jezabella Schriever, MD  CLINICAL INFORMATION: 73 year old female with lower extremity pain, numbness, weakness, high arches and hammertoes, diffuse fasciculations.  FINDINGS: NERVE CONDUCTION STUDY: Slightly prolonged distal latency, normal amplitude, slow conduction velocity and normal F-wave latency. Left ulnar motor response and F-wave latency is normal. Right peroneal motor responses normal distal latency, severely decreased amplitude, slow conduction velocity, normal F-wave latency. Left peroneal motor response has normal distal latency, decreased appetite, slow conduction velocity (27 m/s) and prolonged F-wave latency. Right tibial motor response has normal distal latency, decreased amplitude, slow conduction velocity and prolonged F-wave latency (78 ms). Left tibial motor responses prolonged distal is a, decreased amplitude, partial conduction block with proximal simulation, slow conduction velocity (15 m/s) and prolonged F-wave latency. Left median sensory response is normal. Left ulnar sensory response has decreased of acute and slow conduction velocity (33 m/s). Left radial sensory response is normal. Left sural and bilateral peroneal sensory responses could not be obtained. Right sural sensory response has increased amplitude and prolonged distal latency.  NEEDLE ELECTROMYOGRAPHY: Needle examination of selected muscles (right deltoid, right biceps, right triceps, right flexor carpi radialis, right first dorsal interosseous, right vastus medialis, bilateral tibial anterior, bilateral gastrocnemius) demonstrate positive sharp waves and fibrillation potentials in the right deltoid, right first dorsal interosseous, right  tibialis anterior. Intermittent fasciculations noted in right gastrocnemius muscle. Also with variably decreased motor unit recruitment with large motor units in all muscles tested except the right vastus medialis. Right L4-5 paraspinal muscles normal.  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 Prolonged F-wave latencies, partial conduction block, severe conduction velocity slowing meeting criteria for demyelinating neuropathy. Considerations include CIDP (chronic inflammatory demyelinating polyradiculoneuropathy), autoimmune, inflammatory, hereditary neuropathies.    INTERPRETING PHYSICIAN:  Penni Bombard, MD Certified in Neurology, Neurophysiology and Neuroimaging  [facility] [address] [place], [place] [address] [phone]

## 2013-09-05 ENCOUNTER — Telehealth: Payer: Self-pay | Admitting: Diagnostic Neuroimaging

## 2013-09-05 ENCOUNTER — Telehealth: Payer: Self-pay | Admitting: *Deleted

## 2013-09-05 NOTE — Telephone Encounter (Signed)
Patient saw Dr. Leta Gould last week--he wanted her to have a special blood test which is very expensive--patient's Ins. Company had to be contacted  to see if test would be covered--patient has not heard anything and was f/u on this--please call--thank you.

## 2013-09-06 ENCOUNTER — Encounter: Payer: Self-pay | Admitting: Internal Medicine

## 2013-09-06 ENCOUNTER — Ambulatory Visit (INDEPENDENT_AMBULATORY_CARE_PROVIDER_SITE_OTHER): Payer: Medicare Other | Admitting: Internal Medicine

## 2013-09-06 ENCOUNTER — Other Ambulatory Visit: Payer: Self-pay | Admitting: Diagnostic Neuroimaging

## 2013-09-06 VITALS — BP 130/63 | HR 84 | Temp 97.8°F | Ht 65.0 in | Wt 171.2 lb

## 2013-09-06 DIAGNOSIS — K219 Gastro-esophageal reflux disease without esophagitis: Secondary | ICD-10-CM

## 2013-09-06 DIAGNOSIS — K5900 Constipation, unspecified: Secondary | ICD-10-CM

## 2013-09-06 NOTE — Telephone Encounter (Signed)
See other note

## 2013-09-06 NOTE — Progress Notes (Signed)
Primary Care Physician:  Delphina Cahill, MD Primary Gastroenterologist:  Dr. Gala Romney  Pre-Procedure History & Physical: HPI:  [name] is a 73 y.o. female here for followup of anemia history of elevated LFTs (mild). Hemoccult positive stool. Dysphagia resolved since she had her esophagus dilated a few months ago. Colonoscopy December of last are demonstrated only diverticulosis. Occult blood positive. Capsule study demonstrated a possible submucosal mass. This was not borne out on CT enterography. Some suggestion of the gastric mucosal irritation capsule study. H. pylori serologies negative.  Patient denies abdominal pain appetite is good no early satiety reflux symptoms well controlled. Normal bowel function describes one formed bowel movement daily. No melena rectal bleeding. Taking a fiber supplement daily. Has neuropathy;  she was recently started on Neurontin which feels as also helped her bowel function. Mildly dilated biliary tree on MRI LFTs were mildly elevated last year but recently rechecked now well within normal limits. No CBC this year as far as I can tell. Neurologist did draw labs which are not back as of yet.  Weight is stable. Past Medical History  Diagnosis Date  . Coronary atherosclerosis of native coronary artery     Nonobstructive at catheterization 2008 - 40% circumflex  . Environmental allergies   . Shortness of breath     with exertion  . History of bronchitis     >34yrs ago  . Arthritis     back  . History of migraine     last time a few months ago  . Chronic back pain   . Bruises easily   . History of colon polyps   . Urinary frequency   . Nocturia   . Urinary urgency   . Panic attacks     no meds required  . Insomnia     doesn't take any meds  . Seasonal allergies   . GERD (gastroesophageal reflux disease)     doesn't take any meds from this.  Takes tums as neeeded.  . Breast cancer     Right mastectomy  . Diverticula of colon     Past Surgical  History  Procedure Laterality Date  . Abdominal hysterectomy    . Cholecystectomy    . Bladder suspension    . Back surgery  1981/2011/2014  . Breast surgery Right   . Colonoscopy  12/2009    RMR: few pancolonic diverticula. next TCS 12/2014  . Esophagogastroduodenoscopy  06/14/2007    PZW:CHENID-POEU plaques in esophageal mucosa of uncertain significance brushed and biopsied/Normal stomach, normal first duodenum and second duodenoscopy. KOH negative. esophageal bx c/w GERD  . Foot surgery Right 2014    d/t plantar fascitis  . Breast reconstruction  2004    with abd tissue  . Eye surgery      lasik  . Colonoscopy  06/14/2007    MPN:[id number] rectal polyp, status post cold biopsy removal/ Anal canal hemorrhoids/Left-sided diverticula Colonic mucosa appeared normal.Hyperplastic polyp.   . Colonoscopy  01/18/2004    GQQ:PYPPJKD diverticula/Internal hemorrhoids.  Otherwise normal rectum  . Colonoscopy N/A 04/18/2013    Dr. Gala Romney- normal rectum, scattered left sided diverticula the remainder of the colonic mucosa appeared normal  . Esophagogastroduodenoscopy (egd) with esophageal dilation N/A 04/18/2013    Dr. Gala Romney- normal, patent appearing, tubular esophagus, normal gastric mucosa, paptent pylorus, normal first and second portion of the duodenum  . Givens capsule study N/A 07/18/2013    L.Lewis PAC- markedly abnormal appearing gastric mucosa, sugnificant bile reflux, questionable subucosal small  bowel mass versus extrinsic compression in the distal small bowel.- CTA= no small bowel mass or tumor seen.    Prior to Admission medications   Medication Sig Start Date End Date Taking? Authorizing Provider  acetaminophen (TYLENOL) 500 MG tablet Take 2,000 mg by mouth every 6 (six) hours as needed.   Yes Historical Provider, MD  gabapentin (NEURONTIN) 300 MG capsule Take 1 capsule (300 mg total) by mouth 3 (three) times daily. 08/19/13  Yes Penni Bombard, MD  pantoprazole (PROTONIX) 40 MG  tablet Take 1 tablet (40 mg total) by mouth daily. 07/27/13  Yes Mahala Menghini, PA-C  zolpidem (AMBIEN) 5 MG tablet Take 1 tablet by mouth as needed. 07/21/13  Yes Historical Provider, MD    Allergies as of 09/06/2013 - Review Complete 09/06/2013  Allergen Reaction Noted  . Aspirin Other (See Comments)   . Penicillins Swelling and Other (See Comments)     Family History  Problem Relation Age of Onset  . Coronary artery disease Mother   . Coronary artery disease Father   . Coronary artery disease Brother   . Cancer Sister     lung cancer, two sisters  . Colon cancer Brother     less than age 63  . Lung cancer Sister   . Cancer Brother     History   Social History  . Marital Status: Married    Spouse Name: [name]    Number of Children: 4  . Years of Education: 12th   Occupational History  .     Social History Main Topics  . Smoking status: Former Smoker -- 1.50 packs/day for 30 years    Types: Cigarettes    Quit date: 04/28/2000  . Smokeless tobacco: Never Used     Comment: quit about 63yrs ago  . Alcohol Use: No  . Drug Use: No  . Sexual Activity: Not Currently   Other Topics Concern  . Not on file   Social History Narrative   Patient lives at home with spouse.   Caffeine Use: 2 cups of coffee daily    Review of Systems: See HPI, otherwise negative ROS  Physical Exam: BP 130/63  Pulse 84  Temp(Src) 97.8 F (36.6 C) (Oral)  Ht 5\' 5"  (1.651 m)  Wt 171 lb 3.2 oz (77.656 kg)  BMI 28.49 kg/m2 General:   Alert,  Well-developed, well-nourished, pleasant and cooperative in NAD Skin:  Intact without significant lesions or rashes. Eyes:  Sclera clear, no icterus.   Conjunctiva pink. Ears:  Normal auditory acuity. Nose:  No deformity, discharge,  or lesions. Mouth:  No deformity or lesions. Neck:  Supple; no masses or thyromegaly. No significant cervical adenopathy. Abdomen: Non-distended, normal bowel sounds.  Soft and nontender without appreciable mass or  hepatosplenomegaly.  Pulses:  Normal pulses noted. Extremities:  Without clubbing or edema.  Impression:   73 year old lady with GERD well-controlled on omeprazole. Dysphagia has resolved. LFTs have normalized. Constipation improved. No recent CBC. Overall GI symptoms have improved nicely.  Recommendations:  Continue fiber daily.  Continue omeprazole daily  Will retrieve recent labs from [facility] (may need a CBC if not recently done)  Further recommendations to follow    Notice: This dictation was prepared with Dragon dictation along with smaller phrase technology. Any transcriptional errors that result from this process are unintentional and may not be corrected upon review.

## 2013-09-06 NOTE — Telephone Encounter (Signed)
Patient calling to speak with Lovey Newcomer, states that we need to be the ones to check with her insurance regarding her blood test. Please call patient back and advise.

## 2013-09-06 NOTE — Telephone Encounter (Signed)
I called pt and she is relayed that I needed to call insurance co.  I called and spoke to [place], in authorizations (no notification or prior auth required).  Then called and spoke to Esmond Harps in benefits and eligibility.  For labs $13.00 copay/max $26.00.  100% apply to medicare. Gave her the the CPTs codes 38937-3, 83520-10  No deductible.  $4900 out of pocket max, $468.28 applied.  Copay to apply to out of pocket.  Pt informed.  To fax to [facility] in [place].   Also needing the gabapentin increase called to pharmacy. 600mg  po tid.

## 2013-09-06 NOTE — Patient Instructions (Signed)
Continue fiber daily  Continue omeprazole daily  Will retrieve recent labs from [facility] (may need a CBC if not recently done)  Further recommendations to follow

## 2013-09-07 LAB — ATHENA MAIL

## 2013-09-27 ENCOUNTER — Telehealth: Payer: Self-pay | Admitting: *Deleted

## 2013-09-27 NOTE — Telephone Encounter (Signed)
I called pt and gave her the results of [employer] testing done on Neuropathy Panel.  (negative).  Received the results yesterday. (needs to be scanned in).   She has appt with Dr. Leta Baptist on 11-02-13 at 1100 for follow up.  She is taking gabapentin 600mg  po tid for the last 2 wks.  Has noted no change in pain, but has worsening weakness in legs (can stand only few minutes at a time).  Has swelling in feet, legs, and hands.  (is noted as possible side effects).  She took her dose this am I told her that she may go back to 300mg  po tid. I will send message to [facility], Dr. Jannifer Franklin, for advice re: medication management.  Pt verbalized understanding

## 2013-09-27 NOTE — Telephone Encounter (Signed)
I called patient, no one answered, unable to leave a message. I will call tomorrow.

## 2013-09-28 MED ORDER — NORTRIPTYLINE HCL 10 MG PO CAPS: ORAL_CAPSULE | ORAL | Status: DC

## 2013-09-28 NOTE — Telephone Encounter (Signed)
I called the patient. The patient is having ongoing discomfort in the feet and legs below the knees. The discomfort is better during the day, worse at night, the patient is only sleeping 3-4 hours at night. The patient is on gabapentin taking 600 mg 3 times daily without benefit. I will add low-dose nortriptyline at night for the neuropathy pain.

## 2013-10-17 ENCOUNTER — Telehealth: Payer: Self-pay | Admitting: Diagnostic Neuroimaging

## 2013-10-17 NOTE — Telephone Encounter (Signed)
Pt called states her legs are hurting more, medication was not helping pt stop taking and wants to see if she can get in to see Dr. Tish Frederickson soon, pt has an apt 11/02/13 but states she needs in sooner. Please call pt back concerning this matter. Thanks

## 2013-10-18 NOTE — Telephone Encounter (Signed)
Pt calling stating that the medication gabapentin is not helping, pt states that she is still having leg pain, pt stated that she stop taking and wants to know if she can get in sooner than 11/02/13, next OV. Please advise

## 2013-10-25 NOTE — Telephone Encounter (Signed)
Called pt to schedule app per Dr. Leta Baptist on 10/26/13. I advised the pt that if she has any other problems, questions or concerns to call the office. Pt verbalized understanding.

## 2013-10-25 NOTE — Telephone Encounter (Signed)
Will see pt at next visit. Ok to schedule sooner if there is cancellation or with [employer]. -VRP

## 2013-10-26 ENCOUNTER — Ambulatory Visit (INDEPENDENT_AMBULATORY_CARE_PROVIDER_SITE_OTHER): Payer: Medicare Other | Admitting: Diagnostic Neuroimaging

## 2013-10-26 ENCOUNTER — Encounter: Payer: Self-pay | Admitting: Diagnostic Neuroimaging

## 2013-10-26 VITALS — BP 158/72 | HR 66 | Ht 65.5 in | Wt 170.0 lb

## 2013-10-26 DIAGNOSIS — G589 Mononeuropathy, unspecified: Secondary | ICD-10-CM

## 2013-10-26 DIAGNOSIS — G629 Polyneuropathy, unspecified: Secondary | ICD-10-CM

## 2013-10-26 MED ORDER — GABAPENTIN 800 MG PO TABS: 800.0000 mg | ORAL_TABLET | Freq: Three times a day (TID) | ORAL | Status: DC

## 2013-10-26 NOTE — Patient Instructions (Signed)
Increase gabapentin to 800mg  three times per day.  Start nortriptyline.  Try compounded neuropathy cream (apply 3 times per day).

## 2013-10-26 NOTE — Progress Notes (Signed)
[facility]  PATIENT: [name] DOB: [birth year]  REFERRING CLINICIAN:  HISTORY FROM: patient  REASON FOR VISIT: follow up   HISTORICAL  CHIEF COMPLAINT:  Chief Complaint  Patient presents with  . Follow-up    numbness    HISTORY OF PRESENT ILLNESS:   UPDATE 10/26/13: Since last visit, on gabapentin 600mg  TID; no relief. Still with burning pain in feet, esp at night. She feels miserable. Didn't try nortriptyline yet b/c of warning on bottle.   PRIOR HPI (08/19/13): 73 year old right-handed female here for evaluation of numbness and tingling and pain in the feet since 2010. Patient has low back pain radiating to left leg in 2010 to present. Patient had surgeries in 2010, 2013 and 2014. Symptoms finally improved after 2014 surgery. January 2015 patient developed new symptom of numbness and tingling in bilateral lower extremities, mainly from her toes to knees, with restless leg syndrome symptoms. Patient having more problems weakness, climbing stairs walking and balancing. Patient also having increased urinary urgency since 2014. Patient having numbness and tingling in the arms or the past 2-3 weeks. No symptoms in her face or head. No headache. Patient has noticed development of high arches and hammertoes over past few years.  REVIEW OF SYSTEMS: Full 14 system review of systems performed and notable only for chills fatigue blurred vision environmental allergies back pain restless legs frequent waking depression.   ALLERGIES: Allergies  Allergen Reactions  . Aspirin Other (See Comments)    Causes hemoptysis  . Penicillins Swelling and Other (See Comments)    blisters    HOME MEDICATIONS: Outpatient Prescriptions Prior to Visit  Medication Sig Dispense Refill  . acetaminophen (TYLENOL) 500 MG tablet Take 2,000 mg by mouth every 6 (six) hours as needed.      . nortriptyline (PAMELOR) 10 MG capsule Take one capsule at night for one week, then take 2  capsules at night for one week, then take 3 capsules at night  90 capsule  3  . pantoprazole (PROTONIX) 40 MG tablet Take 1 tablet (40 mg total) by mouth daily.  30 tablet  11  . zolpidem (AMBIEN) 5 MG tablet Take 1 tablet by mouth as needed.      . gabapentin (NEURONTIN) 300 MG capsule Take 600 mg by mouth 3 (three) times daily.       No facility-administered medications prior to visit.    PAST MEDICAL HISTORY: Past Medical History  Diagnosis Date  . Coronary atherosclerosis of native coronary artery     Nonobstructive at catheterization 2008 - 40% circumflex  . Environmental allergies   . Shortness of breath     with exertion  . History of bronchitis     >59yrs ago  . Arthritis     back  . History of migraine     last time a few months ago  . Chronic back pain   . Bruises easily   . History of colon polyps   . Urinary frequency   . Nocturia   . Urinary urgency   . Panic attacks     no meds required  . Insomnia     doesn't take any meds  . Seasonal allergies   . GERD (gastroesophageal reflux disease)     doesn't take any meds from this.  Takes tums as neeeded.  . Breast cancer     Right mastectomy  . Diverticula of colon     PAST SURGICAL HISTORY: Past Surgical History  Procedure Laterality Date  .  Abdominal hysterectomy    . Cholecystectomy    . Bladder suspension    . Back surgery  1981/2011/2014  . Breast surgery Right   . Colonoscopy  12/2009    RMR: few pancolonic diverticula. next TCS 12/2014  . Esophagogastroduodenoscopy  06/14/2007    QMG:[id number]-JKDT plaques in esophageal mucosa of uncertain significance brushed and biopsied/Normal stomach, normal first duodenum and second duodenoscopy. KOH negative. esophageal bx c/w GERD  . Foot surgery Right 2014    d/t plantar fascitis  . Breast reconstruction  2004    with abd tissue  . Eye surgery      lasik  . Colonoscopy  06/14/2007    OIZ:[id number] rectal polyp, status post cold biopsy removal/ Anal canal  hemorrhoids/Left-sided diverticula Colonic mucosa appeared normal.Hyperplastic polyp.   . Colonoscopy  01/18/2004    JAS:NKNLZJQ diverticula/Internal hemorrhoids.  Otherwise normal rectum  . Colonoscopy N/A 04/18/2013    Dr. Gala Romney- normal rectum, scattered left sided diverticula the remainder of the colonic mucosa appeared normal  . Esophagogastroduodenoscopy (egd) with esophageal dilation N/A 04/18/2013    Dr. Gala Romney- normal, patent appearing, tubular esophagus, normal gastric mucosa, paptent pylorus, normal first and second portion of the duodenum  . Givens capsule study N/A 07/18/2013    L.Lewis PAC- markedly abnormal appearing gastric mucosa, sugnificant bile reflux, questionable subucosal small bowel mass versus extrinsic compression in the distal small bowel.- CTA= no small bowel mass or tumor seen.    FAMILY HISTORY: Family History  Problem Relation Age of Onset  . Coronary artery disease Mother   . Coronary artery disease Father   . Coronary artery disease Brother   . Cancer Sister     lung cancer, two sisters  . Colon cancer Brother     less than age 66  . Lung cancer Sister   . Cancer Brother     SOCIAL HISTORY:  History   Social History  . Marital Status: Married    Spouse Name: [name]    Number of Children: 4  . Years of Education: 12th   Occupational History  .     Social History Main Topics  . Smoking status: Former Smoker -- 1.50 packs/day for 30 years    Types: Cigarettes    Quit date: 04/28/2000  . Smokeless tobacco: Never Used     Comment: quit about 49yrs ago  . Alcohol Use: No  . Drug Use: No  . Sexual Activity: Not Currently   Other Topics Concern  . Not on file   Social History Narrative   Patient lives at home with spouse.   Caffeine Use: 2 cups of coffee daily     PHYSICAL EXAM  Filed Vitals:   10/26/13 1428  BP: 158/72  Pulse: 66  Height: 5' 5.5" (1.664 m)  Weight: 170 lb (77.111 kg)    Not recorded    Body mass index is  27.85 kg/(m^2).  GENERAL EXAM: Patient is in no distress; well developed, nourished and groomed; neck is supple.  CARDIOVASCULAR: Regular rate and rhythm, no murmurs, no carotid bruits  NEUROLOGIC: MENTAL STATUS: awake, alert, language fluent, comprehension intact, naming intact, fund of knowledge appropriate CRANIAL NERVE: pupils equal and reactive to light, visual fields full to confrontation, extraocular muscles intact, no nystagmus, facial sensation and strength symmetric, hearing intact, palate elevates symmetrically, uvula midline, shoulder shrug symmetric, tongue midline. MOTOR: normal bulk and tone; BUE (DELTOIDS 4+, OTHERWISE 5). BLE (HF 4, KE 5, KF 4, DF 4). HIGH ARCH AND HAMMERTOES NOTED  BILATERALLY SENSORY: ABSENT VIB AT TOES AND ANKLES. DECR PP IN FEET UP TO KNEES. DECR TEMP IN FEET. COORDINATION: finger-nose-finger, fine finger movements normal REFLEXES: BUE 1, RIGHT KNEE 2, LEFT KNEE 1, ANKLES 0.  GAIT/STATION: narrow based gait; SLOW, CAUTIOUS, HIGH STEPPING GAIT. CANNOT WALK ON TOES OR HEELS. CANNOT TANDEM. UNSTEADY. Romberg is negative    DIAGNOSTIC DATA (LABS, IMAGING, TESTING) - I reviewed patient records, labs, notes, testing and imaging myself where available.  Lab Results  Component Value Date   WBC 10.0 04/04/2013   HGB 11.7* 04/04/2013   HCT 33.7* 04/04/2013   MCV 86.4 04/04/2013   PLT 326 04/04/2013      Component Value Date/Time   NA 137 01/13/2013 1012   K 4.2 01/13/2013 1012   CL 101 01/13/2013 1012   CO2 26 01/13/2013 1012   GLUCOSE 95 01/13/2013 1012   BUN 18 01/13/2013 1012   CREATININE 1.00 08/03/2013 0932   CREATININE 0.94 08/03/2013 0833   CALCIUM 10.3 01/13/2013 1012   PROT 7.3 08/19/2013 1124   PROT 6.9 06/22/2013 0855   ALBUMIN 4.2 06/22/2013 0855   AST 33 06/22/2013 0855   ALT 22 06/22/2013 0855   ALKPHOS 73 06/22/2013 0855   BILITOT 0.4 06/22/2013 0855   GFRNONAA 66* 01/13/2013 1012   GFRAA 76* 01/13/2013 1012   No results found for this basename:  CHOL,  HDL,  LDLCALC,  LDLDIRECT,  TRIG,  CHOLHDL   Lab Results  Component Value Date   HGBA1C 6.1* 08/19/2013   Lab Results  Component Value Date   VITAMINB12 407 08/19/2013   Lab Results  Component Value Date   TSH 1.340 08/19/2013    08/31/13 - neuropathy panel - RF (197.2), Vit D 29.1  09/06/13 LABS - [facility] lab panel (sensorimotor neuropathy) - negative  08/31/13 EMG/NCS - widespread sensorimotor polyneuropathy, with axonal and demyelinating features, with acute and chronic denervation changes. Prolonged F-wave latencies, partial conduction block, severe conduction velocity slowing meeting criteria for demyelinating neuropathy. Considerations include CIDP (chronic inflammatory demyelinating polyradiculoneuropathy), autoimmune, inflammatory, hereditary neuropathies.   ASSESSMENT AND PLAN  73 y.o. year old female here with history of multiple low back surgeries, now with symm bilateral lower ext numbness, pain, weakness. EMG/NCS shows axonal and demyelinating polyneuropathy. Labs notable for elevated RF, slightly elevated A1c, slightly decr Vit D. Will check LP for CIDP evaluation.  Ddx: polyneuropathy (autoimmune, inflamm, hereditary)   PLAN: - lumbar puncture for CIDP evaluation - increase gabapentin to 800mg  TID - start nortriptyline - compounded neuropathy cream  Orders Placed This Encounter  Procedures  . DG FLUORO GUIDE LUMBAR PUNCTURE   Meds ordered this encounter  Medications  . gabapentin (NEURONTIN) 300 MG capsule    Sig: Take 1 capsule (300 mg total) by mouth 3 (three) times daily.    Dispense:  90 capsule    Refill:  12   Return in about 2 months (around 12/27/2013).    Penni Bombard, MD 0/12/3233, 5:73 PM Certified in Neurology, Neurophysiology and Neuroimaging  [facility] [address], [place], [place] [address] [phone]

## 2013-11-01 ENCOUNTER — Ambulatory Visit
Admission: RE | Admit: 2013-11-01 | Discharge: 2013-11-01 | Disposition: A | Discharge status: Home / Self Care | Payer: Medicare Other | Source: Ambulatory Visit | Attending: Diagnostic Neuroimaging | Admitting: Diagnostic Neuroimaging

## 2013-11-01 ENCOUNTER — Other Ambulatory Visit: Payer: Self-pay | Admitting: Neurology

## 2013-11-01 VITALS — BP 147/57 | HR 62

## 2013-11-01 DIAGNOSIS — G589 Mononeuropathy, unspecified: Secondary | ICD-10-CM

## 2013-11-01 DIAGNOSIS — R51 Headache: Secondary | ICD-10-CM

## 2013-11-01 DIAGNOSIS — R209 Unspecified disturbances of skin sensation: Secondary | ICD-10-CM

## 2013-11-01 DIAGNOSIS — G629 Polyneuropathy, unspecified: Secondary | ICD-10-CM

## 2013-11-01 LAB — CSF CELL COUNT WITH DIFFERENTIAL
RBC Count, CSF: 42 cu mm — ABNORMAL HIGH
Tube #: 2
WBC, CSF: 1 cu mm (ref 0–5)

## 2013-11-01 LAB — PROTEIN, CSF: Total Protein, CSF: 37 mg/dL (ref 15–45)

## 2013-11-01 LAB — GLUCOSE, CSF: Glucose, CSF: 56 mg/dL (ref 43–76)

## 2013-11-01 NOTE — Discharge Instructions (Signed)

## 2013-11-01 NOTE — Progress Notes (Signed)
Discharge instructions explained to pt by J. Lohr Therapist, sports. Family at bedside.

## 2013-11-02 ENCOUNTER — Ambulatory Visit: Payer: Medicare Other | Admitting: Diagnostic Neuroimaging

## 2013-11-05 LAB — CSF CULTURE W GRAM STAIN
Gram Stain: NONE SEEN
Organism ID, Bacteria: NO GROWTH

## 2013-11-30 ENCOUNTER — Other Ambulatory Visit (HOSPITAL_COMMUNITY): Payer: Self-pay | Admitting: Internal Medicine

## 2013-11-30 DIAGNOSIS — Z1231 Encounter for screening mammogram for malignant neoplasm of breast: Secondary | ICD-10-CM

## 2013-12-02 ENCOUNTER — Encounter: Payer: Self-pay | Admitting: Diagnostic Neuroimaging

## 2013-12-02 ENCOUNTER — Ambulatory Visit (INDEPENDENT_AMBULATORY_CARE_PROVIDER_SITE_OTHER): Payer: Medicare Other | Admitting: Diagnostic Neuroimaging

## 2013-12-02 VITALS — BP 148/69 | HR 71 | Ht 65.5 in | Wt 169.6 lb

## 2013-12-02 DIAGNOSIS — G609 Hereditary and idiopathic neuropathy, unspecified: Secondary | ICD-10-CM

## 2013-12-02 NOTE — Progress Notes (Signed)
[facility]  PATIENT: [name] DOB: 1941/02/20  REFERRING CLINICIAN:  HISTORY FROM: patient  REASON FOR VISIT: follow up   HISTORICAL  CHIEF COMPLAINT:  Chief Complaint  Patient presents with  . Follow-up    numbness    HISTORY OF PRESENT ILLNESS:   UPDATE 12/02/13: Since last visit, now on gabapentin 800mg  TID + norptriptyline 30mg  qhs, and getting some relief at night time with burning feet pain. Still having diff with balance and walking in day time. Requesting handicap tag. Uses cane and walker at day time.   UPDATE 10/26/13: Since last visit, on gabapentin 600mg  TID; no relief. Still with burning pain in feet, esp at night. She feels miserable. Didn't try nortriptyline yet b/c of warning on bottle.   PRIOR HPI (08/19/13): 73 year old right-handed female here for evaluation of numbness and tingling and pain in the feet since 2010. Patient has low back pain radiating to left leg in 2010 to present. Patient had surgeries in 2010, 2013 and 2014. Symptoms finally improved after 2014 surgery. January 2015 patient developed new symptom of numbness and tingling in bilateral lower extremities, mainly from her toes to knees, with restless leg syndrome symptoms. Patient having more problems weakness, climbing stairs walking and balancing. Patient also having increased urinary urgency since 2014. Patient having numbness and tingling in the arms or the past 2-3 weeks. No symptoms in her face or head. No headache. Patient has noticed development of high arches and hammertoes over past few years.  REVIEW OF SYSTEMS: Full 14 system review of systems performed and notable only for back pain aching muscles leg swelling restless legs heat intolerance env allergies.   ALLERGIES: Allergies  Allergen Reactions  . Aspirin Other (See Comments)    Causes hemoptysis  . Penicillins Swelling and Other (See Comments)    blisters    HOME MEDICATIONS: Outpatient Prescriptions  Prior to Visit  Medication Sig Dispense Refill  . acetaminophen (TYLENOL) 500 MG tablet Take 2,000 mg by mouth every 6 (six) hours as needed.      . gabapentin (NEURONTIN) 800 MG tablet Take 1 tablet (800 mg total) by mouth 3 (three) times daily.  90 tablet  12  . nortriptyline (PAMELOR) 10 MG capsule Take one capsule at night for one week, then take 2 capsules at night for one week, then take 3 capsules at night  90 capsule  3  . pantoprazole (PROTONIX) 40 MG tablet Take 1 tablet (40 mg total) by mouth daily.  30 tablet  11  . zolpidem (AMBIEN) 5 MG tablet Take 1 tablet by mouth as needed.       No facility-administered medications prior to visit.    PAST MEDICAL HISTORY: Past Medical History  Diagnosis Date  . Coronary atherosclerosis of native coronary artery     Nonobstructive at catheterization 2008 - 40% circumflex  . Environmental allergies   . Shortness of breath     with exertion  . History of bronchitis     >41yrs ago  . Arthritis     back  . History of migraine     last time a few months ago  . Chronic back pain   . Bruises easily   . History of colon polyps   . Urinary frequency   . Nocturia   . Urinary urgency   . Panic attacks     no meds required  . Insomnia     doesn't take any meds  . Seasonal allergies   .  GERD (gastroesophageal reflux disease)     doesn't take any meds from this.  Takes tums as neeeded.  . Breast cancer     Right mastectomy  . Diverticula of colon     PAST SURGICAL HISTORY: Past Surgical History  Procedure Laterality Date  . Abdominal hysterectomy    . Cholecystectomy    . Bladder suspension    . Back surgery  1981/2011/2014  . Breast surgery Right   . Colonoscopy  12/2009    RMR: few pancolonic diverticula. next TCS 12/2014  . Esophagogastroduodenoscopy  06/14/2007    ZDG:UYQIHK-VQQV plaques in esophageal mucosa of uncertain significance brushed and biopsied/Normal stomach, normal first duodenum and second duodenoscopy. KOH  negative. esophageal bx c/w GERD  . Foot surgery Right 2014    d/t plantar fascitis  . Breast reconstruction  2004    with abd tissue  . Eye surgery      lasik  . Colonoscopy  06/14/2007    ZDG:LOVFIEPPIR rectal polyp, status post cold biopsy removal/ Anal canal hemorrhoids/Left-sided diverticula Colonic mucosa appeared normal.Hyperplastic polyp.   . Colonoscopy  01/18/2004    JJO:ACZYSAY diverticula/Internal hemorrhoids.  Otherwise normal rectum  . Colonoscopy N/A 04/18/2013    Dr. Gala Romney- normal rectum, scattered left sided diverticula the remainder of the colonic mucosa appeared normal  . Esophagogastroduodenoscopy (egd) with esophageal dilation N/A 04/18/2013    Dr. Gala Romney- normal, patent appearing, tubular esophagus, normal gastric mucosa, paptent pylorus, normal first and second portion of the duodenum  . Givens capsule study N/A 07/18/2013    L.Lewis PAC- markedly abnormal appearing gastric mucosa, sugnificant bile reflux, questionable subucosal small bowel mass versus extrinsic compression in the distal small bowel.- CTA= no small bowel mass or tumor seen.    FAMILY HISTORY: Family History  Problem Relation Age of Onset  . Coronary artery disease Mother   . Coronary artery disease Father   . Coronary artery disease Brother   . Cancer Sister     lung cancer, two sisters  . Colon cancer Brother     less than age 79  . Lung cancer Sister   . Cancer Brother     SOCIAL HISTORY:  History   Social History  . Marital Status: Married    Spouse Name: [name]    Number of Children: 4  . Years of Education: 12th   Occupational History  .     Social History Main Topics  . Smoking status: Former Smoker -- 1.50 packs/day for 30 years    Types: Cigarettes    Quit date: 04/28/2000  . Smokeless tobacco: Never Used     Comment: quit about 41yrs ago  . Alcohol Use: No  . Drug Use: No  . Sexual Activity: Not Currently   Other Topics Concern  . Not on file   Social History  Narrative   Patient lives at home with spouse.   Caffeine Use: 2 cups of coffee daily     PHYSICAL EXAM  Filed Vitals:   12/02/13 0903  BP: 148/69  Pulse: 71  Height: 5' 5.5" (1.664 m)  Weight: 169 lb 9.6 oz (76.93 kg)    Not recorded    Body mass index is 27.78 kg/(m^2).  GENERAL EXAM: Patient is in no distress; well developed, nourished and groomed; neck is supple.  CARDIOVASCULAR: Regular rate and rhythm, no murmurs, no carotid bruits  NEUROLOGIC: MENTAL STATUS: awake, alert, language fluent, comprehension intact, naming intact, fund of knowledge appropriate CRANIAL NERVE: pupils equal and reactive to  light, visual fields full to confrontation, extraocular muscles intact, no nystagmus, facial sensation and strength symmetric, hearing intact, palate elevates symmetrically, uvula midline, shoulder shrug symmetric, tongue midline. MOTOR: normal bulk and tone; BUE (DELTOIDS 4+, OTHERWISE 5). BLE (HF 4, KE 5, KF 4, DF 4). HIGH ARCH AND HAMMERTOES NOTED BILATERALLY SENSORY: ABSENT VIB AT TOES AND ANKLES. DECR PP IN FEET UP TO KNEES. DECR TEMP IN FEET. COORDINATION: finger-nose-finger, fine finger movements normal REFLEXES: BUE 1, RIGHT KNEE 2, LEFT KNEE 1, ANKLES 0.  GAIT/STATION: narrow based gait; SLOW, CAUTIOUS, HIGH STEPPING GAIT. CANNOT WALK ON TOES OR HEELS. CANNOT TANDEM. UNSTEADY. Romberg is negative    DIAGNOSTIC DATA (LABS, IMAGING, TESTING) - I reviewed patient records, labs, notes, testing and imaging myself where available.  Lab Results  Component Value Date   WBC 10.0 04/04/2013   HGB 11.7* 04/04/2013   HCT 33.7* 04/04/2013   MCV 86.4 04/04/2013   PLT 326 04/04/2013      Component Value Date/Time   NA 137 01/13/2013 1012   K 4.2 01/13/2013 1012   CL 101 01/13/2013 1012   CO2 26 01/13/2013 1012   GLUCOSE 95 01/13/2013 1012   BUN 18 01/13/2013 1012   CREATININE 1.00 08/03/2013 0932   CREATININE 0.94 08/03/2013 0833   CALCIUM 10.3 01/13/2013 1012   PROT 7.3  08/19/2013 1124   PROT 6.9 06/22/2013 0855   ALBUMIN 4.2 06/22/2013 0855   AST 33 06/22/2013 0855   ALT 22 06/22/2013 0855   ALKPHOS 73 06/22/2013 0855   BILITOT 0.4 06/22/2013 0855   GFRNONAA 66* 01/13/2013 1012   GFRAA 76* 01/13/2013 1012   No results found for this basename: CHOL,  HDL,  LDLCALC,  LDLDIRECT,  TRIG,  CHOLHDL   Lab Results  Component Value Date   HGBA1C 6.1* 08/19/2013   Lab Results  Component Value Date   VITAMINB12 407 08/19/2013   Lab Results  Component Value Date   TSH 1.340 08/19/2013    08/31/13 - neuropathy panel - RF (197.2), Vit D 29.1  09/06/13 LABS - [facility] lab panel (sensorimotor neuropathy) - negative  08/31/13 EMG/NCS - widespread sensorimotor polyneuropathy, with axonal and demyelinating features, with acute and chronic denervation changes. Prolonged F-wave latencies, partial conduction block, severe conduction velocity slowing meeting criteria for demyelinating neuropathy. Considerations include CIDP (chronic inflammatory demyelinating polyradiculoneuropathy), autoimmune, inflammatory, hereditary neuropathies.  11/01/13 CT head (without) - Unremarkable. Incidental possible left frontal arachnoid cyst (3.6x1.8cm; image 23).  11/01/13 LP - opening pressure 14cm H2O; CSF studies:  WBC 1, RBC 42, protein 37, glucose 56, culture neg    ASSESSMENT AND PLAN  73 y.o. year old female here with history of lumbar spinal stenosis, lumbar radiculopathies, s/p multiple low back surgeries, now with symm bilateral lower ext numbness, pain, weakness. EMG/NCS shows axonal and demyelinating polyneuropathy. Labs notable for elevated RF, slightly elevated A1c, slightly decr Vit D. LP CSF studies unremarkable.   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    Penni Bombard, MD 07/27/9620, 29:79 AM Certified in Neurology, Neurophysiology and Neuroimaging  [facility] [address] [place], [place] [address] [phone]

## 2013-12-02 NOTE — Patient Instructions (Signed)
Continue nortriptyline and gabapentin.

## 2013-12-09 ENCOUNTER — Ambulatory Visit (HOSPITAL_COMMUNITY)
Admission: RE | Admit: 2013-12-09 | Discharge: 2013-12-09 | Disposition: A | Discharge status: Home / Self Care | Payer: Medicare Other | Source: Ambulatory Visit | Attending: Internal Medicine | Admitting: Internal Medicine

## 2013-12-09 DIAGNOSIS — Z1231 Encounter for screening mammogram for malignant neoplasm of breast: Secondary | ICD-10-CM

## 2013-12-22 ENCOUNTER — Telehealth: Payer: Self-pay | Admitting: Internal Medicine

## 2013-12-22 NOTE — Telephone Encounter (Signed)
Pt called this morning asking if she could be seen by LSL or RMR today. I told her that we didn't have any openings and then she asked to speak with someone. I told her that i would have to take a message and have the nurse call her back. She is having blood in stool and has a lot of gas. Please advise and call [phone]

## 2013-12-26 NOTE — Telephone Encounter (Signed)
Tried to call pt x2. Both times the phone did not ring and I got a message stating the voicemail has not been set up yet.

## 2013-12-28 NOTE — Telephone Encounter (Signed)
Finally able to reach pt- she said she is having a lot of gas all day long. She has not tried anything otc, except beeno and it doesn't help. I advised her to try gas-x and see if it helps. Pt said she would try it and let me know.   Also pt is having what she said looks like dark blood in her stool. No pain, no N/V, no fever. This has been going on for 2-3 months. She is taking her benafiber and miralax prn. Last tcs in 03/2013.   Pt would like to know if she can do an ifobt to see if it really is blood in her stool? Told her that I would let RMR know and if he said it was ok, I would mail it to her. She agreed with plan.

## 2013-12-29 NOTE — Telephone Encounter (Signed)
I have mailed out her an IFOBT test

## 2013-12-29 NOTE — Telephone Encounter (Signed)
Not a bad idea. Let's do a fecal occult blood test

## 2014-01-30 ENCOUNTER — Ambulatory Visit (INDEPENDENT_AMBULATORY_CARE_PROVIDER_SITE_OTHER): Payer: Medicare Other

## 2014-01-30 DIAGNOSIS — K5900 Constipation, unspecified: Secondary | ICD-10-CM

## 2014-01-31 LAB — IFOBT (OCCULT BLOOD): IFOBT: POSITIVE

## 2014-01-31 NOTE — Progress Notes (Signed)
Pt returned IFOBT test and it was POSITIVE

## 2014-02-01 ENCOUNTER — Encounter: Payer: Self-pay | Admitting: Internal Medicine

## 2014-02-01 NOTE — Progress Notes (Signed)
APPT MADE AND LETTER SENT  °

## 2014-02-06 NOTE — Progress Notes (Signed)
Patient scheduled for 03/01/14

## 2014-03-01 ENCOUNTER — Ambulatory Visit (INDEPENDENT_AMBULATORY_CARE_PROVIDER_SITE_OTHER): Payer: Medicare Other | Admitting: Gastroenterology

## 2014-03-01 ENCOUNTER — Encounter: Payer: Self-pay | Admitting: Gastroenterology

## 2014-03-01 VITALS — BP 148/67 | HR 72 | Ht 65.5 in | Wt 169.0 lb

## 2014-03-01 DIAGNOSIS — K219 Gastro-esophageal reflux disease without esophagitis: Secondary | ICD-10-CM

## 2014-03-01 DIAGNOSIS — K5900 Constipation, unspecified: Secondary | ICD-10-CM

## 2014-03-01 DIAGNOSIS — R109 Unspecified abdominal pain: Secondary | ICD-10-CM

## 2014-03-01 DIAGNOSIS — R195 Other fecal abnormalities: Secondary | ICD-10-CM

## 2014-03-01 LAB — CBC WITH DIFFERENTIAL/PLATELET
Basophils Absolute: 0.1 10*3/uL (ref 0.0–0.1)
Basophils Relative: 1 % (ref 0–1)
Eosinophils Absolute: 0.2 10*3/uL (ref 0.0–0.7)
Eosinophils Relative: 3 % (ref 0–5)
HCT: 33.3 % — ABNORMAL LOW (ref 36.0–46.0)
Hemoglobin: 11.3 g/dL — ABNORMAL LOW (ref 12.0–15.0)
Lymphocytes Relative: 57 % — ABNORMAL HIGH (ref 12–46)
Lymphs Abs: 4.7 10*3/uL — ABNORMAL HIGH (ref 0.7–4.0)
MCH: 30.2 pg (ref 26.0–34.0)
MCHC: 33.9 g/dL (ref 30.0–36.0)
MCV: 89 fL (ref 78.0–100.0)
Monocytes Absolute: 0.7 10*3/uL (ref 0.1–1.0)
Monocytes Relative: 8 % (ref 3–12)
Neutro Abs: 2.5 10*3/uL (ref 1.7–7.7)
Neutrophils Relative %: 31 % — ABNORMAL LOW (ref 43–77)
Platelets: 325 10*3/uL (ref 150–400)
RBC: 3.74 MIL/uL — ABNORMAL LOW (ref 3.87–5.11)
RDW: 12.8 % (ref 11.5–15.5)
WBC: 8.2 10*3/uL (ref 4.0–10.5)

## 2014-03-01 NOTE — Assessment & Plan Note (Signed)
73 year old lady with history of anemia and heme positive stool undergoing extensive evaluation in 2014/bring 2015 as outlined above who presents with complaints of refractory GERD, mid abdominal pain, dark stools, alternating constipation/diarrhea. Recently had heme positive stools again. On capsule endoscopy in March, she had markedly abnormal appearing gastric mucosa at that time. Given current ongoing symptoms, offered her an upper endoscopy for further evaluation.  I have discussed the risks, alternatives, benefits with regards to but not limited to the risk of reaction to medication, bleeding, infection, perforation and the patient is agreeable to proceed. Written consent to be obtained.  CBC to follow-up on anemia.   With regards to bowel issues, trial of Hardin Negus colon health 1 daily for 4 weeks. She had a fullness in the left midabdomen although she is had alteration of her abdominal wall for her reconstructive wrist surgery. Multiple imaging studies last year without any significant findings in this area. If she has ongoing pain, she will need further evaluation. She has been instructed to call with ongoing symptoms.

## 2014-03-01 NOTE — Patient Instructions (Signed)
1. Start a probiotic for next four weeks. [employer] Colon Health one daily. 2. Please have your labs done. 3. Upper endoscopy as scheduled. See separate instructions.

## 2014-03-01 NOTE — Progress Notes (Signed)
Primary Care Physician:  Delphina Cahill, MD  Primary Gastroenterologist:  Garfield Cornea, MD   Chief Complaint  Patient presents with  . Hemoccult Positive    HPI:  [name] is a 73 y.o. female here follow up of heme + stool. She has h/o anemia. EGD 03/2013 was unremarkable. Colonoscopy December 2014, scattered left-sided diverticula. Givens capsule study March 2015 showed markedly abnormal appearing gastric mucosa, significant bile reflux, questionable submucosal small bowel mass versus extrinsic compression in the distal small bowel. CTE showed no small bowel mass or tumor. H. Pylori serologies were negative.   Patient called in 12/2013 reports increased gas, "dark stools", for several months. She has had refractory GERD for months. Takes Prilosec otc, one daily. Has to take TUMS as well. C/o nocturnal heartburn.  Mid abd pain, mostly on left side. Lot of gas. Intermittent constipation/diarhrea. May go 2-3 days without a BM and then have 3-4 loose stools once finally has a BM. No rectal bleeding.   Current Outpatient Prescriptions  Medication Sig Dispense Refill  . acetaminophen (TYLENOL) 500 MG tablet Take 2,000 mg by mouth every 6 (six) hours as needed.    Marland Kitchen amLODipine (NORVASC) 5 MG tablet Take 5 mg by mouth daily.   5  . omeprazole (PRILOSEC OTC) 20 MG tablet Take 20 mg by mouth daily.    . traMADol (ULTRAM) 50 MG tablet Take 50 mg by mouth 2 (two) times daily as needed.   2  . zolpidem (AMBIEN) 5 MG tablet Take 1 tablet by mouth as needed.     No current facility-administered medications for this visit.    Allergies as of 03/01/2014 - Review Complete 03/01/2014  Allergen Reaction Noted  . Aspirin Other (See Comments)   . Penicillins Swelling and Other (See Comments)     Past Medical History  Diagnosis Date  . Coronary atherosclerosis of native coronary artery     Nonobstructive at catheterization 2008 - 40% circumflex  . Environmental allergies   . Shortness of breath    with exertion  . History of bronchitis     >102yrs ago  . Arthritis     back  . History of migraine     last time a few months ago  . Chronic back pain   . Bruises easily   . History of colon polyps   . Urinary frequency   . Nocturia   . Urinary urgency   . Panic attacks     no meds required  . Insomnia     doesn't take any meds  . Seasonal allergies   . GERD (gastroesophageal reflux disease)     doesn't take any meds from this.  Takes tums as neeeded.  . Breast cancer     Right mastectomy  . Diverticula of colon     Past Surgical History  Procedure Laterality Date  . Abdominal hysterectomy    . Cholecystectomy    . Bladder suspension    . Back surgery  1981/2011/2014  . Breast surgery Right   . Colonoscopy  12/2009    RMR: few pancolonic diverticula. next TCS 12/2014  . Esophagogastroduodenoscopy  06/14/2007    JIR:CVELFY-BOFB plaques in esophageal mucosa of uncertain significance brushed and biopsied/Normal stomach, normal first duodenum and second duodenoscopy. KOH negative. esophageal bx c/w GERD  . Foot surgery Right 2014    d/t plantar fascitis  . Breast reconstruction  2004    with abd tissue  . Eye surgery  lasik  . Colonoscopy  06/14/2007    TRV:UYEBXIDHWY rectal polyp, status post cold biopsy removal/ Anal canal hemorrhoids/Left-sided diverticula Colonic mucosa appeared normal.Hyperplastic polyp.   . Colonoscopy  01/18/2004    SHU:OHFGBMS diverticula/Internal hemorrhoids.  Otherwise normal rectum  . Colonoscopy N/A 04/18/2013    Dr. Gala Romney- normal rectum, scattered left sided diverticula the remainder of the colonic mucosa appeared normal  . Esophagogastroduodenoscopy (egd) with esophageal dilation N/A 04/18/2013    Dr. Gala Romney- normal, patent appearing, tubular esophagus, normal gastric mucosa, paptent pylorus, normal first and second portion of the duodenum  . Givens capsule study N/A 07/18/2013    L.Ave Scharnhorst PAC- markedly abnormal appearing gastric mucosa,  sugnificant bile reflux, questionable subucosal small bowel mass versus extrinsic compression in the distal small bowel.- CTE= no small bowel mass or tumor seen.    Family History  Problem Relation Age of Onset  . Coronary artery disease Mother   . Coronary artery disease Father   . Coronary artery disease Brother   . Cancer Sister     lung cancer, two sisters  . Colon cancer Brother     less than age 35  . Lung cancer Sister   . Cancer Brother     History   Social History  . Marital Status: Married    Spouse Name: [name]    Number of Children: 4  . Years of Education: 12th   Occupational History  .     Social History Main Topics  . Smoking status: Former Smoker -- 1.50 packs/day for 30 years    Types: Cigarettes    Quit date: 04/28/2000  . Smokeless tobacco: Never Used     Comment: quit about 41yrs ago  . Alcohol Use: No  . Drug Use: No  . Sexual Activity: Not Currently   Other Topics Concern  . Not on file   Social History Narrative   Patient lives at home with spouse.   Caffeine Use: 2 cups of coffee daily      ROS:  General: Negative for anorexia, weight loss, fever, chills, fatigue, weakness. Eyes: Negative for vision changes.  ENT: Negative for hoarseness, difficulty swallowing , nasal congestion. CV: Negative for chest pain, angina, palpitations, dyspnea on exertion, peripheral edema.  Respiratory: Negative for dyspnea at rest, dyspnea on exertion, cough, sputum, wheezing.  GI: See history of present illness. GU:  Negative for dysuria, hematuria, urinary incontinence, urinary frequency, nocturnal urination.  MS: Negative for joint pain. chronic low back pain.  Derm: Negative for rash or itching.  Neuro: Negative for weakness, abnormal sensation, seizure, frequent headaches, memory loss, confusion.  Psych: Negative for anxiety, depression, suicidal ideation, hallucinations.  Endo: Negative for unusual weight change.  Heme: Negative for bruising or  bleeding. Allergy: Negative for rash or hives.    Physical Examination:  BP 148/67 mmHg  Pulse 72  Ht 5' 5.5" (1.664 m)  Wt 169 lb (76.658 kg)  BMI 27.69 kg/m2   General: Well-nourished, well-developed in no acute distress.  Head: Normocephalic, atraumatic.   Eyes: Conjunctiva pink, no icterus. Mouth: Oropharyngeal mucosa moist and pink , no lesions erythema or exudate. Neck: Supple without thyromegaly, masses, or lymphadenopathy.  Lungs: Clear to auscultation bilaterally.  Heart: Regular rate and rhythm, no murmurs rubs or gallops.  Abdomen: Bowel sounds are normal, mild left/mid abd fullness associated with tenderness. No hepatosplenomegaly or masses, no abdominal bruits or    hernia , no rebound or guarding.   Rectal: not performed Extremities: No lower extremity edema.  No clubbing or deformities.  Neuro: Alert and oriented x 4 , grossly normal neurologically.  Skin: Warm and dry, no rash or jaundice.   Psych: Alert and cooperative, normal mood and affect.     Imaging Studies: No results found.

## 2014-03-02 ENCOUNTER — Other Ambulatory Visit: Payer: Self-pay

## 2014-03-02 DIAGNOSIS — K2100 Gastro-esophageal reflux disease with esophagitis, without bleeding: Secondary | ICD-10-CM

## 2014-03-02 DIAGNOSIS — R1084 Generalized abdominal pain: Secondary | ICD-10-CM

## 2014-03-06 NOTE — Progress Notes (Signed)
Quick Note:  Await EGD findings. ______

## 2014-03-06 NOTE — Progress Notes (Signed)
Quick Note:  Mild persistent anemia. Recently heme + stool. EGD planned. ______

## 2014-03-07 ENCOUNTER — Telehealth: Payer: Self-pay | Admitting: Internal Medicine

## 2014-03-07 NOTE — Telephone Encounter (Signed)
Noted  

## 2014-03-07 NOTE — Progress Notes (Signed)
Patient stated she never received her instructions, so I went over her instructions by telephone and mailed out a copy.

## 2014-03-07 NOTE — Telephone Encounter (Signed)
Patient called to cancel her procedure on 11/25 and she will call to reschedule later.

## 2014-03-08 NOTE — Progress Notes (Signed)
cc'ed to pcp °

## 2014-03-15 ENCOUNTER — Encounter: Payer: Self-pay | Admitting: Diagnostic Neuroimaging

## 2014-03-22 ENCOUNTER — Ambulatory Visit (HOSPITAL_COMMUNITY): Admission: RE | Admit: 2014-03-22 | Payer: Medicare Other | Source: Ambulatory Visit | Admitting: Internal Medicine

## 2014-03-22 ENCOUNTER — Encounter (HOSPITAL_COMMUNITY): Admission: RE | Payer: Self-pay | Source: Ambulatory Visit

## 2014-03-22 SURGERY — EGD (ESOPHAGOGASTRODUODENOSCOPY)
Anesthesia: Moderate Sedation

## 2014-05-16 DIAGNOSIS — M79604 Pain in right leg: Secondary | ICD-10-CM | POA: Diagnosis not present

## 2014-05-16 DIAGNOSIS — M79605 Pain in left leg: Secondary | ICD-10-CM | POA: Diagnosis not present

## 2014-05-30 DIAGNOSIS — I87323 Chronic venous hypertension (idiopathic) with inflammation of bilateral lower extremity: Secondary | ICD-10-CM | POA: Diagnosis not present

## 2014-05-30 DIAGNOSIS — I83813 Varicose veins of bilateral lower extremities with pain: Secondary | ICD-10-CM | POA: Diagnosis not present

## 2014-06-09 DIAGNOSIS — I8312 Varicose veins of left lower extremity with inflammation: Secondary | ICD-10-CM | POA: Diagnosis not present

## 2014-06-26 DIAGNOSIS — M5440 Lumbago with sciatica, unspecified side: Secondary | ICD-10-CM | POA: Diagnosis not present

## 2014-07-05 DIAGNOSIS — M961 Postlaminectomy syndrome, not elsewhere classified: Secondary | ICD-10-CM | POA: Diagnosis not present

## 2014-07-05 DIAGNOSIS — M47817 Spondylosis without myelopathy or radiculopathy, lumbosacral region: Secondary | ICD-10-CM | POA: Diagnosis not present

## 2014-07-05 DIAGNOSIS — Z79899 Other long term (current) drug therapy: Secondary | ICD-10-CM | POA: Diagnosis not present

## 2014-07-05 DIAGNOSIS — G894 Chronic pain syndrome: Secondary | ICD-10-CM | POA: Diagnosis not present

## 2014-07-05 DIAGNOSIS — M5137 Other intervertebral disc degeneration, lumbosacral region: Secondary | ICD-10-CM | POA: Diagnosis not present

## 2014-07-10 DIAGNOSIS — M47817 Spondylosis without myelopathy or radiculopathy, lumbosacral region: Secondary | ICD-10-CM | POA: Diagnosis not present

## 2014-07-10 DIAGNOSIS — Z79899 Other long term (current) drug therapy: Secondary | ICD-10-CM | POA: Diagnosis not present

## 2014-07-10 DIAGNOSIS — G894 Chronic pain syndrome: Secondary | ICD-10-CM | POA: Diagnosis not present

## 2014-07-24 DIAGNOSIS — I10 Essential (primary) hypertension: Secondary | ICD-10-CM | POA: Diagnosis not present

## 2014-07-24 DIAGNOSIS — R7301 Impaired fasting glucose: Secondary | ICD-10-CM | POA: Diagnosis not present

## 2014-07-25 ENCOUNTER — Encounter: Payer: Self-pay | Admitting: Nurse Practitioner

## 2014-07-25 ENCOUNTER — Other Ambulatory Visit: Payer: Self-pay

## 2014-07-25 ENCOUNTER — Ambulatory Visit (INDEPENDENT_AMBULATORY_CARE_PROVIDER_SITE_OTHER): Payer: Medicare Other | Admitting: Nurse Practitioner

## 2014-07-25 VITALS — BP 146/69 | HR 75 | Temp 97.2°F | Ht 65.0 in | Wt 168.6 lb

## 2014-07-25 DIAGNOSIS — D509 Iron deficiency anemia, unspecified: Secondary | ICD-10-CM

## 2014-07-25 DIAGNOSIS — D649 Anemia, unspecified: Secondary | ICD-10-CM | POA: Diagnosis not present

## 2014-07-25 DIAGNOSIS — K625 Hemorrhage of anus and rectum: Secondary | ICD-10-CM | POA: Insufficient documentation

## 2014-07-25 DIAGNOSIS — R195 Other fecal abnormalities: Secondary | ICD-10-CM | POA: Diagnosis not present

## 2014-07-25 DIAGNOSIS — R109 Unspecified abdominal pain: Secondary | ICD-10-CM | POA: Diagnosis not present

## 2014-07-25 DIAGNOSIS — R1314 Dysphagia, pharyngoesophageal phase: Secondary | ICD-10-CM

## 2014-07-25 MED ORDER — PEG 3350-KCL-NA BICARB-NACL 420 G PO SOLR: 4000.0000 mL | ORAL | Status: DC

## 2014-07-25 NOTE — Progress Notes (Signed)
Referring Provider: Delphina Cahill, MD Primary Care Physician:  Delphina Cahill, MD Primary GI:  Dr. Gala Romney  Chief Complaint  Patient presents with  . Rectal Pain  . Rectal Bleeding  . Abdominal Pain    HPI:   74 year old female presents for left-sided abdominal pain, pressure, bleeding into her bowels. She had an EGD 04/18/2013 with Venia Minks dilation which found normal EGD status post dilation. Recommendations continue Prilosec 20 mg daily. Colonoscopy on the same day for change in bowel habits/constipation found adequate preparation, normal rectum, scattered left-sided diverticula, the remainder of the colonic mucosa appeared normal. Recommendations begin Benefiber 2 teaspoons twice daily, MiraLAX 17 g orally at bedtime as needed for constipation, stop Linzess, repeat colonoscopy in 5 years.  Today she states she's been having stomach pain. Has continued with dark stools since her last visit. Noted also one episode of red blood in stool 2 weeks ago. At her last visit on 03/02/2013 had heme+ stool and anemia, recommended repeat EGD, but she had to cancel this. Stomach pain is in her lower abdomen, denies N/V, has a bowel movement ; Has had 2 episodes of morning cold sweats. Denies fevers, notable change in appetite. Admits weakness and fatigue. Denies unintentional weight loss. Her lower abdominal pain is severe at times, states she's never had pain like that before. Has occasional dysphagia about once a week with dry solid foods with occasional regurgitation. States she doesn't eat a lot when she's alone for fear of choking. Denies any other upper or lower GI symptoms.  Past Medical History  Diagnosis Date  . Coronary atherosclerosis of native coronary artery     Nonobstructive at catheterization 2008 - 40% circumflex  . Environmental allergies   . Shortness of breath     with exertion  . History of bronchitis     >64yrs ago  . Arthritis     back  . History of migraine     last time a few  months ago  . Chronic back pain   . Bruises easily   . History of colon polyps   . Urinary frequency   . Nocturia   . Urinary urgency   . Panic attacks     no meds required  . Insomnia     doesn't take any meds  . Seasonal allergies   . GERD (gastroesophageal reflux disease)     doesn't take any meds from this.  Takes tums as neeeded.  . Breast cancer     Right mastectomy  . Diverticula of colon     Past Surgical History  Procedure Laterality Date  . Abdominal hysterectomy    . Cholecystectomy    . Bladder suspension    . Back surgery  1981/2011/2014  . Breast surgery Right   . Colonoscopy  12/2009    RMR: few pancolonic diverticula. next TCS 12/2014  . Esophagogastroduodenoscopy  06/14/2007    DZH:GDJMEQ-ASTM plaques in esophageal mucosa of uncertain significance brushed and biopsied/Normal stomach, normal first duodenum and second duodenoscopy. KOH negative. esophageal bx c/w GERD  . Foot surgery Right 2014    d/t plantar fascitis  . Breast reconstruction  2004    with abd tissue  . Eye surgery      lasik  . Colonoscopy  06/14/2007    HDQ:QIWLNLGXQJ rectal polyp, status post cold biopsy removal/ Anal canal hemorrhoids/Left-sided diverticula Colonic mucosa appeared normal.Hyperplastic polyp.   . Colonoscopy  01/18/2004    JHE:RDEYCXK diverticula/Internal hemorrhoids.  Otherwise normal rectum  .  Colonoscopy N/A 04/18/2013    Dr. Gala Romney- normal rectum, scattered left sided diverticula the remainder of the colonic mucosa appeared normal  . Esophagogastroduodenoscopy (egd) with esophageal dilation N/A 04/18/2013    Dr. Gala Romney- normal, patent appearing, tubular esophagus, normal gastric mucosa, paptent pylorus, normal first and second portion of the duodenum  . Givens capsule study N/A 07/18/2013    L.Lewis PAC- markedly abnormal appearing gastric mucosa, sugnificant bile reflux, questionable subucosal small bowel mass versus extrinsic compression in the distal small bowel.- CTE=  no small bowel mass or tumor seen.    Current Outpatient Prescriptions  Medication Sig Dispense Refill  . acetaminophen (TYLENOL) 500 MG tablet Take 2,000 mg by mouth every 6 (six) hours as needed.    Marland Kitchen amLODipine (NORVASC) 5 MG tablet Take 5 mg by mouth daily.   5  . omeprazole (PRILOSEC OTC) 20 MG tablet Take 20 mg by mouth daily.    . traMADol (ULTRAM) 50 MG tablet Take 50 mg by mouth 2 (two) times daily as needed for moderate pain.   2  . zolpidem (AMBIEN) 5 MG tablet Take 1 tablet by mouth at bedtime as needed for sleep.      No current facility-administered medications for this visit.    Allergies as of 07/25/2014 - Review Complete 07/25/2014  Allergen Reaction Noted  . Aspirin Other (See Comments)   . Penicillins Swelling and Other (See Comments)     Family History  Problem Relation Age of Onset  . Coronary artery disease Mother   . Coronary artery disease Father   . Coronary artery disease Brother   . Cancer Sister     lung cancer, two sisters  . Colon cancer Brother     less than age 96  . Lung cancer Sister   . Cancer Brother     History   Social History  . Marital Status: Married    Spouse Name: [name]  . Number of Children: 4  . Years of Education: 12th   Occupational History  .     Social History Main Topics  . Smoking status: Former Smoker -- 1.50 packs/day for 30 years    Types: Cigarettes    Quit date: 04/28/2000  . Smokeless tobacco: Never Used     Comment: quit about 21yrs ago  . Alcohol Use: No  . Drug Use: No  . Sexual Activity: Not Currently   Other Topics Concern  . None   Social History Narrative   Patient lives at home with spouse.   Caffeine Use: 2 cups of coffee daily    Review of Systems: Gen: Denies fever. Admits chills. Admits fatigue, weakness. Denies weight loss.  CV: Denies chest pain, palpitations, syncope, peripheral edema. Resp: Denies dyspnea at rest, wheezing, coughing up blood. GI: See HPI. Denies vomiting blood,  jaundice, and fecal incontinence.    Derm: Denies rash, itching, dry skin Psych: Denies depression, anxiety, memory loss, confusion.  Heme: Denies bruising, bleeding, and enlarged lymph nodes.  Physical Exam: BP 146/69 mmHg  Pulse 75  Temp(Src) 97.2 F (36.2 C)  Ht 5\' 5"  (1.651 m)  Wt 168 lb 9.6 oz (76.476 kg)  BMI 28.06 kg/m2 General:   Alert and oriented. No distress noted. Pleasant and cooperative.  Head:  Normocephalic and atraumatic. Eyes:  Conjuctiva clear without scleral icterus. Mouth:  Oral mucosa pink and moist. No lesions. No OP edema. Neck:  Supple, without mass or thyromegaly. Lungs:  Clear to auscultation bilaterally. No wheezes, rales, or rhonchi.  No distress.  Heart:  S1, S2 present without murmurs, rubs, or gallops. Regular rate and rhythm. Abdomen:  +BS, soft, and non-distended. Mild to moderate TTP. No rebound or guarding. No HSM or masses noted. Msk:  Symmetrical without gross deformities. Normal posture. Extremities:  Without edema. Neurologic:  Alert and  oriented x4;  grossly normal neurologically. Skin:  Intact without significant lesions or rashes. Cervical Nodes:  No significant cervical adenopathy. Psych:  Alert and cooperative. Normal mood and affect.    07/25/2014 9:40 AM

## 2014-07-25 NOTE — Patient Instructions (Signed)
1. We will schedule your endoscopy and colonoscopy for you. 2. I am requesting lab work and an Insurance account manager of your abdomen to evaluate your anemia and symptoms further 3. Further recommendations to be based on the results of your procedure.

## 2014-07-26 ENCOUNTER — Ambulatory Visit (HOSPITAL_COMMUNITY)
Admission: RE | Admit: 2014-07-26 | Discharge: 2014-07-26 | Disposition: A | Discharge status: Home / Self Care | Payer: Medicare Other | Source: Ambulatory Visit | Attending: Nurse Practitioner | Admitting: Nurse Practitioner

## 2014-07-26 DIAGNOSIS — R7301 Impaired fasting glucose: Secondary | ICD-10-CM | POA: Diagnosis not present

## 2014-07-26 DIAGNOSIS — K5900 Constipation, unspecified: Secondary | ICD-10-CM | POA: Insufficient documentation

## 2014-07-26 DIAGNOSIS — D509 Iron deficiency anemia, unspecified: Secondary | ICD-10-CM | POA: Diagnosis not present

## 2014-07-26 DIAGNOSIS — M545 Low back pain: Secondary | ICD-10-CM | POA: Diagnosis not present

## 2014-07-26 DIAGNOSIS — R109 Unspecified abdominal pain: Secondary | ICD-10-CM | POA: Insufficient documentation

## 2014-07-26 DIAGNOSIS — K625 Hemorrhage of anus and rectum: Secondary | ICD-10-CM

## 2014-07-26 DIAGNOSIS — R300 Dysuria: Secondary | ICD-10-CM | POA: Diagnosis not present

## 2014-07-26 LAB — CBC WITH DIFFERENTIAL/PLATELET
Basophils Absolute: 0.1 10*3/uL (ref 0.0–0.1)
Basophils Relative: 1 % (ref 0–1)
Eosinophils Absolute: 0.2 10*3/uL (ref 0.0–0.7)
Eosinophils Relative: 2 % (ref 0–5)
HCT: 34.6 % — ABNORMAL LOW (ref 36.0–46.0)
Hemoglobin: 11.8 g/dL — ABNORMAL LOW (ref 12.0–15.0)
Lymphocytes Relative: 47 % — ABNORMAL HIGH (ref 12–46)
Lymphs Abs: 3.6 10*3/uL (ref 0.7–4.0)
MCH: 31 pg (ref 26.0–34.0)
MCHC: 34.1 g/dL (ref 30.0–36.0)
MCV: 90.8 fL (ref 78.0–100.0)
MPV: 9.6 fL (ref 8.6–12.4)
Monocytes Absolute: 0.6 10*3/uL (ref 0.1–1.0)
Monocytes Relative: 8 % (ref 3–12)
Neutro Abs: 3.2 10*3/uL (ref 1.7–7.7)
Neutrophils Relative %: 42 % — ABNORMAL LOW (ref 43–77)
Platelets: 343 10*3/uL (ref 150–400)
RBC: 3.81 MIL/uL — ABNORMAL LOW (ref 3.87–5.11)
RDW: 13.4 % (ref 11.5–15.5)
WBC: 7.7 10*3/uL (ref 4.0–10.5)

## 2014-07-27 NOTE — Progress Notes (Signed)
cc'ed to pcp °

## 2014-07-27 NOTE — Assessment & Plan Note (Signed)
See rectal bleeding assessment and plan.

## 2014-07-27 NOTE — Assessment & Plan Note (Signed)
74 year old female presents for abdominal pain, continued dark stool since her last visit, and one episode of hematochezia 2 weeks ago. At her previous visit on 03/02/2013 had heme-positive stools as well as anemia and was recommended a repeat EGD but she had to cancel. Pain is located in her lower abdomen and also admits occasional cold sweats as well as notable change in her appetite. She is also having weakness and fatigue but denies weight loss pain intensity varies but become severe at times. He admits occasional dysphagia about once a week with dry solid foods and occasionally has regurgitation. Today we'll recheck his CBC for her anemia status as well as abdominal x-ray. We will also plan for an endoscopy and colonoscopy with possible dilation.  Proceed with TCS and endoscopy +/1 dilation with Dr. Gala Romney in near future: the risks, benefits, and alternatives have been discussed with the patient in detail. The patient states understanding and desires to proceed.

## 2014-08-09 DIAGNOSIS — I8312 Varicose veins of left lower extremity with inflammation: Secondary | ICD-10-CM | POA: Diagnosis not present

## 2014-08-18 ENCOUNTER — Ambulatory Visit (HOSPITAL_COMMUNITY)
Admission: RE | Admit: 2014-08-18 | Discharge: 2014-08-18 | Disposition: A | Discharge status: Home / Self Care | Payer: Medicare Other | Source: Ambulatory Visit | Attending: Internal Medicine | Admitting: Internal Medicine

## 2014-08-18 ENCOUNTER — Encounter (HOSPITAL_COMMUNITY)
Admission: RE | Disposition: A | Discharge status: Home / Self Care | Payer: Self-pay | Source: Ambulatory Visit | Attending: Internal Medicine

## 2014-08-18 DIAGNOSIS — D509 Iron deficiency anemia, unspecified: Secondary | ICD-10-CM | POA: Diagnosis not present

## 2014-08-18 DIAGNOSIS — R1314 Dysphagia, pharyngoesophageal phase: Secondary | ICD-10-CM | POA: Diagnosis not present

## 2014-08-18 DIAGNOSIS — K5730 Diverticulosis of large intestine without perforation or abscess without bleeding: Secondary | ICD-10-CM | POA: Diagnosis not present

## 2014-08-18 DIAGNOSIS — K3189 Other diseases of stomach and duodenum: Secondary | ICD-10-CM | POA: Insufficient documentation

## 2014-08-18 DIAGNOSIS — R1310 Dysphagia, unspecified: Secondary | ICD-10-CM | POA: Diagnosis present

## 2014-08-18 DIAGNOSIS — K219 Gastro-esophageal reflux disease without esophagitis: Secondary | ICD-10-CM | POA: Insufficient documentation

## 2014-08-18 DIAGNOSIS — I2510 Atherosclerotic heart disease of native coronary artery without angina pectoris: Secondary | ICD-10-CM | POA: Insufficient documentation

## 2014-08-18 DIAGNOSIS — K317 Polyp of stomach and duodenum: Secondary | ICD-10-CM | POA: Diagnosis not present

## 2014-08-18 DIAGNOSIS — K648 Other hemorrhoids: Secondary | ICD-10-CM | POA: Diagnosis not present

## 2014-08-18 DIAGNOSIS — Z853 Personal history of malignant neoplasm of breast: Secondary | ICD-10-CM | POA: Insufficient documentation

## 2014-08-18 DIAGNOSIS — Z90710 Acquired absence of both cervix and uterus: Secondary | ICD-10-CM | POA: Diagnosis not present

## 2014-08-18 DIAGNOSIS — M549 Dorsalgia, unspecified: Secondary | ICD-10-CM | POA: Insufficient documentation

## 2014-08-18 DIAGNOSIS — Z87891 Personal history of nicotine dependence: Secondary | ICD-10-CM | POA: Insufficient documentation

## 2014-08-18 DIAGNOSIS — Z79891 Long term (current) use of opiate analgesic: Secondary | ICD-10-CM | POA: Diagnosis not present

## 2014-08-18 DIAGNOSIS — G43909 Migraine, unspecified, not intractable, without status migrainosus: Secondary | ICD-10-CM | POA: Diagnosis not present

## 2014-08-18 DIAGNOSIS — Z79899 Other long term (current) drug therapy: Secondary | ICD-10-CM | POA: Insufficient documentation

## 2014-08-18 DIAGNOSIS — Z9011 Acquired absence of right breast and nipple: Secondary | ICD-10-CM | POA: Diagnosis not present

## 2014-08-18 DIAGNOSIS — R195 Other fecal abnormalities: Secondary | ICD-10-CM

## 2014-08-18 DIAGNOSIS — Z86010 Personal history of colonic polyps: Secondary | ICD-10-CM | POA: Diagnosis not present

## 2014-08-18 DIAGNOSIS — D649 Anemia, unspecified: Secondary | ICD-10-CM | POA: Insufficient documentation

## 2014-08-18 DIAGNOSIS — G8929 Other chronic pain: Secondary | ICD-10-CM | POA: Diagnosis not present

## 2014-08-18 DIAGNOSIS — G4700 Insomnia, unspecified: Secondary | ICD-10-CM | POA: Diagnosis not present

## 2014-08-18 DIAGNOSIS — Z9049 Acquired absence of other specified parts of digestive tract: Secondary | ICD-10-CM | POA: Diagnosis not present

## 2014-08-18 HISTORY — PX: COLONOSCOPY: SHX5424

## 2014-08-18 HISTORY — PX: ESOPHAGOGASTRODUODENOSCOPY: SHX5428

## 2014-08-18 HISTORY — PX: MALONEY DILATION: SHX5535

## 2014-08-18 SURGERY — COLONOSCOPY
Anesthesia: Moderate Sedation

## 2014-08-18 MED ORDER — MIDAZOLAM HCL 5 MG/5ML IJ SOLN
INTRAMUSCULAR | Status: AC
  Filled 2014-08-18: qty 10

## 2014-08-18 MED ORDER — LIDOCAINE VISCOUS 2 % MT SOLN
OROMUCOSAL | Status: AC
  Filled 2014-08-18: qty 15

## 2014-08-18 MED ORDER — MIDAZOLAM HCL 5 MG/5ML IJ SOLN
INTRAMUSCULAR | Status: DC | PRN
  Administered 2014-08-18 (×4): 1 mg via INTRAVENOUS
  Administered 2014-08-18 (×2): 2 mg via INTRAVENOUS

## 2014-08-18 MED ORDER — LIDOCAINE VISCOUS 2 % MT SOLN
OROMUCOSAL | Status: DC | PRN
  Administered 2014-08-18: 3 mL via OROMUCOSAL

## 2014-08-18 MED ORDER — STERILE WATER FOR IRRIGATION IR SOLN
Status: DC | PRN
  Administered 2014-08-18: 10:00:00

## 2014-08-18 MED ORDER — ONDANSETRON HCL 4 MG/2ML IJ SOLN
INTRAMUSCULAR | Status: DC | PRN
  Administered 2014-08-18: 4 mg via INTRAVENOUS

## 2014-08-18 MED ORDER — ONDANSETRON HCL 4 MG/2ML IJ SOLN
INTRAMUSCULAR | Status: AC
  Filled 2014-08-18: qty 2

## 2014-08-18 MED ORDER — SODIUM CHLORIDE 0.9 % IV SOLN
INTRAVENOUS | Status: DC
  Administered 2014-08-18: 1000 mL via INTRAVENOUS

## 2014-08-18 MED ORDER — MEPERIDINE HCL 100 MG/ML IJ SOLN
INTRAMUSCULAR | Status: AC
  Filled 2014-08-18: qty 2

## 2014-08-18 MED ORDER — MEPERIDINE HCL 100 MG/ML IJ SOLN
INTRAMUSCULAR | Status: DC | PRN
  Administered 2014-08-18: 50 mg via INTRAVENOUS
  Administered 2014-08-18 (×2): 25 mg via INTRAVENOUS

## 2014-08-18 NOTE — Op Note (Signed)
[facility] [address] [place], [address]   ENDOSCOPY PROCEDURE REPORT  PATIENT: [name], [name]  MR#: [id number] BIRTHDATE: 10/28/1940 , 24  yrs. old GENDER: female ENDOSCOPIST: R.  Garfield Cornea, MD FACP FACG REFERRED BY:  Delphina Cahill, M.D. PROCEDURE DATE:  09-07-2014 PROCEDURE:  EGD, diagnostic, EGD w/ biopsy , and Maloney dilation of esophagus INDICATIONS:  Recurrent esophageal dysphagia; Hemoccult-positive stool. MEDICATIONS: Versed 5 mg IV and Demerol 75 mg IV in divided doses. Xylocaine gel orally.  Zofran 4 mg IV. ASA CLASS:      Class II  CONSENT: The risks, benefits, limitations, alternatives and imponderables have been discussed.  The potential for biopsy, esophogeal dilation, etc. have also been reviewed.  Questions have been answered.  All parties agreeable.  Please see the history and physical in the medical record for more information.  DESCRIPTION OF PROCEDURE: After the risks benefits and alternatives of the procedure were thoroughly explained, informed consent was obtained.  The EG-2990i ([id number]) endoscope was introduced through the mouth and advanced to the second portion of the duodenum , limited by Without limitations. The instrument was slowly withdrawn as the mucosa was fully examined.    Normal-appearing, patent tubular esophagus.  Stomach with much bile stained gastric mucus.  Patient had a few scattered 1-2 mm benign-appearing gastric polyps; otherwise, no ulcer or infiltrating process seen.  Patent pylorus to Normal first and second portion of the duodenum.  Retroflexed views revealed as previously described.   The scope was withdrawn and a 54 [place] Maloney dilator was passed up the insertion easily. A look back revealed no apparent complication related to this maneuver. Subsequently, biopsies of the mid and distal esophagus were taken for histologic study. Finally, biopsy of one of the gastric polyps taken.  The scope was  then withdrawn from the patient and the procedure completed.  COMPLICATIONS: There were no immediate complications.  ENDOSCOPIC IMPRESSION: Normal esophagus?"status post Maloney dilation and subsequent biopsy. Stomach full of bile stained mucus of uncertain significance. Gastric polyps?"status post biopsy  RECOMMENDATIONS: Follow-up on pathology. See colonoscopy report.  REPEAT EXAM:  eSigned:  R. Garfield Cornea, MD Rosalita Chessman [facility] 2014-09-07 10:25 AM    CC:  CPT CODES: ICD CODES:  The ICD and CPT codes recommended by this software are interpretations from the data that the clinical staff has captured with the software.  The verification of the translation of this report to the ICD and CPT codes and modifiers is the sole responsibility of the health care institution and practicing physician where this report was generated.  [place]. will not be held responsible for the validity of the ICD and CPT codes included on this report.  AMA assumes no liability for data contained or not contained herein. CPT is a Designer, television/film set of the [employer].  PATIENT NAME:  [name], [name] MR#: [id number]

## 2014-08-18 NOTE — Op Note (Signed)
[place], [id number]   COLONOSCOPY PROCEDURE REPORT  PATIENT: [name], [name]  MR#: [id number] BIRTHDATE: Jun 21, 1940 , 46  yrs. old GENDER: female ENDOSCOPIST: R.  Garfield Cornea, MD FACP [facility] REFERRED HL:KTGY Hall, M.D. PROCEDURE DATE:  2014/09/01 PROCEDURE:   Ileo-colonoscopy, diagnostic INDICATIONS:    Hemoccult-positive stool; anemia; abdominal pain. MEDICATIONS:  Versed 8 mg IV and Demerol 100 mg IV in divided doses. Zofran 4 mg IV. ASA CLASS:       Class II  CONSENT: The risks, benefits, alternatives and imponderables including but not limited to bleeding, perforation as well as the possibility of a missed lesion have been reviewed.  The potential for biopsy, lesion removal, etc. have also been discussed. Questions have been answered.  All parties agreeable.  Please see the history and physical in the medical record for more information.  DESCRIPTION OF PROCEDURE:   After the risks benefits and alternatives of the procedure were thoroughly explained, informed consent was obtained.  The digital rectal exam revealed no abnormalities of the rectum.   The EG-2990i ([id number])  endoscope was introduced through the anus and advanced to the terminal ileum which was intubated for a short distance. No adverse events experienced.   The quality of the prep was adequate  The instrument was then slowly withdrawn as the colon was fully examined.      COLON FINDINGS: Minimal anal canal hemorrhoids; otherwise, normal rectum.  Somewhat redundant colon.  Scattered left-sided diverticula; the remainder of the colonic mucosa appeared normal. The distal 10 cm of terminal ileal mucosa also appeared normal. Retroflexed views revealed no abnormalities. .  Withdrawal time=7 minutes 0 seconds.  The scope was withdrawn and the procedure completed. COMPLICATIONS: There were no immediate complications.  ENDOSCOPIC IMPRESSION: Minimal anal canal  hemorrhoids. Colonic diverticulosis. No evidence of any inflammatory process or other pathology to explain her symptoms which seem to be a little out of proportion to objective endoscopic findings today.  RECOMMENDATIONS: Will proceed with a contrast CT of the abdomen and pelvis to further evaluate her symptoms. See EGD report.  eSigned:  R. Garfield Cornea, MD Rosalita Chessman [facility] 09/01/2014 10:55 AM   cc:  CPT CODES: ICD CODES:  The ICD and CPT codes recommended by this software are interpretations from the data that the clinical staff has captured with the software.  The verification of the translation of this report to the ICD and CPT codes and modifiers is the sole responsibility of the health care institution and practicing physician where this report was generated.  [place]. will not be held responsible for the validity of the ICD and CPT codes included on this report.  AMA assumes no liability for data contained or not contained herein. CPT is a Designer, television/film set of the [employer].  PATIENT NAME:  [name], [name] MR#: [id number]

## 2014-08-18 NOTE — Discharge Instructions (Addendum)
Colonoscopy Discharge Instructions  Read the instructions outlined below and refer to this sheet in the next few weeks. These discharge instructions provide you with general information on caring for yourself after you leave the hospital. Your doctor may also give you specific instructions. While your treatment has been planned according to the most current medical practices available, unavoidable complications occasionally occur. If you have any problems or questions after discharge, call Dr. Gala Romney at [phone]. ACTIVITY  You may resume your regular activity, but move at a slower pace for the next 24 hours.   Take frequent rest periods for the next 24 hours.   Walking will help get rid of the air and reduce the bloated feeling in your belly (abdomen).   No driving for 24 hours (because of the medicine (anesthesia) used during the test).    Do not sign any important legal documents or operate any machinery for 24 hours (because of the anesthesia used during the test).  NUTRITION  Drink plenty of fluids.   You may resume your normal diet as instructed by your doctor.   Begin with a light meal and progress to your normal diet. Heavy or fried foods are harder to digest and may make you feel sick to your stomach (nauseated).   Avoid alcoholic beverages for 24 hours or as instructed.  MEDICATIONS  You may resume your normal medications unless your doctor tells you otherwise.  WHAT YOU CAN EXPECT TODAY  Some feelings of bloating in the abdomen.   Passage of more gas than usual.   Spotting of blood in your stool or on the toilet paper.  IF YOU HAD POLYPS REMOVED DURING THE COLONOSCOPY:  No aspirin products for 7 days or as instructed.   No alcohol for 7 days or as instructed.   Eat a soft diet for the next 24 hours.  FINDING OUT THE RESULTS OF YOUR TEST Not all test results are available during your visit. If your test results are not back during the visit, make an appointment  with your caregiver to find out the results. Do not assume everything is normal if you have not heard from your caregiver or the medical facility. It is important for you to follow up on all of your test results.  SEEK IMMEDIATE MEDICAL ATTENTION IF:  You have more than a spotting of blood in your stool.   Your belly is swollen (abdominal distention).   You are nauseated or vomiting.   You have a temperature over 101.   You have abdominal pain or discomfort that is severe or gets worse throughout the day.   EGD Discharge instructions Please read the instructions outlined below and refer to this sheet in the next few weeks. These discharge instructions provide you with general information on caring for yourself after you leave the hospital. Your doctor may also give you specific instructions. While your treatment has been planned according to the most current medical practices available, unavoidable complications occasionally occur. If you have any problems or questions after discharge, please call your doctor. ACTIVITY  You may resume your regular activity but move at a slower pace for the next 24 hours.   Take frequent rest periods for the next 24 hours.   Walking will help expel (get rid of) the air and reduce the bloated feeling in your abdomen.   No driving for 24 hours (because of the anesthesia (medicine) used during the test).   You may shower.   Do not sign any  important legal documents or operate any machinery for 24 hours (because of the anesthesia used during the test).  NUTRITION  Drink plenty of fluids.   You may resume your normal diet.   Begin with a light meal and progress to your normal diet.   Avoid alcoholic beverages for 24 hours or as instructed by your caregiver.  MEDICATIONS  You may resume your normal medications unless your caregiver tells you otherwise.  WHAT YOU CAN EXPECT TODAY  You may experience abdominal discomfort such as a feeling of  fullness or gas pains.  FOLLOW-UP  Your doctor will discuss the results of your test with you.  SEEK IMMEDIATE MEDICAL ATTENTION IF ANY OF THE FOLLOWING OCCUR:  Excessive nausea (feeling sick to your stomach) and/or vomiting.   Severe abdominal pain and distention (swelling).   Trouble swallowing.   Temperature over 101 F (37.8 C).  Rectal bleeding or vomiting of blood.     Diverticulosis information provided  Contrast CT of the abdomen and pelvis to further evaluate lower abdominal pain and anemia in the near future.  Further recommendations to follow pending review of pathology report  Diverticulosis Diverticulosis is the condition that develops when small pouches (diverticula) form in the wall of your colon. Your colon, or large intestine, is where water is absorbed and stool is formed. The pouches form when the inside layer of your colon pushes through weak spots in the outer layers of your colon. CAUSES  No one knows exactly what causes diverticulosis. RISK FACTORS Being older than 91. Your risk for this condition increases with age. Diverticulosis is rare in people younger than 40 years. By age 67, almost everyone has it. Eating a low-fiber diet. Being frequently constipated. Being overweight. Not getting enough exercise. Smoking. Taking over-the-counter pain medicines, like aspirin and ibuprofen. SYMPTOMS  Most people with diverticulosis do not have symptoms. DIAGNOSIS  Because diverticulosis often has no symptoms, health care providers often discover the condition during an exam for other colon problems. In many cases, a health care provider will diagnose diverticulosis while using a flexible scope to examine the colon (colonoscopy). TREATMENT  If you have never developed an infection related to diverticulosis, you may not need treatment. If you have had an infection before, treatment may include: Eating more fruits, vegetables, and grains. Taking a fiber  supplement. Taking a live bacteria supplement (probiotic). Taking medicine to relax your colon. HOME CARE INSTRUCTIONS  Drink at least 6-8 glasses of water each day to prevent constipation. Try not to strain when you have a bowel movement. Keep all follow-up appointments. If you have had an infection before: Increase the fiber in your diet as directed by your health care provider or dietitian. Take a dietary fiber supplement if your health care provider approves. Only take medicines as directed by your health care provider. SEEK MEDICAL CARE IF:  You have abdominal pain. You have bloating. You have cramps. You have not gone to the bathroom in 3 days. SEEK IMMEDIATE MEDICAL CARE IF:  Your pain gets worse. Yourbloating becomes very bad. You have a fever or chills, and your symptoms suddenly get worse. You begin vomiting. You have bowel movements that are bloody or black. MAKE SURE YOU: Understand these instructions. Will watch your condition. Will get help right away if you are not doing well or get worse. Document Released: 01/10/2004 Document Revised: 04/19/2013 Document Reviewed: 03/09/2013 [facility] Patient Information 2015 [place], [place]. This information is not intended to replace advice given to you by  your health care provider. Make sure you discuss any questions you have with your health care provider. ° °

## 2014-08-18 NOTE — Interval H&P Note (Signed)
History and Physical Interval Note:  08/18/2014 10:01 AM  [name]  has presented today for surgery, with the diagnosis of HEME POSITIVE STOOLS/ANEMIA/DYSPHAGIA  The various methods of treatment have been discussed with the patient and family. After consideration of risks, benefits and other options for treatment, the patient has consented to  Procedure(s) with comments: COLONOSCOPY (N/A) - 1000 ESOPHAGOGASTRODUODENOSCOPY (EGD) (N/A) MALONEY DILATION (N/A) as a surgical intervention .  The patient's history has been reviewed, patient examined, no change in status, stable for surgery.  I have reviewed the patient's chart and labs.  Questions were answered to the patient's satisfaction.     [name]  No change. Patient states esophageal dilation previously lasted a good year. Otherwise no change. EGD with possible esophageal dilation and colonoscopy per plan. I told patient further studies such as cross-sectional imaging may be needed depending on today's findings.The risks, benefits, limitations, imponderables and alternatives regarding both EGD and colonoscopy have been reviewed with the patient. Questions have been answered. All parties agreeable.

## 2014-08-18 NOTE — H&P (View-Only) (Signed)
Referring Provider: Delphina Cahill, MD Primary Care Physician:  Delphina Cahill, MD Primary GI:  Dr. Gala Romney  Chief Complaint  Patient presents with  . Rectal Pain  . Rectal Bleeding  . Abdominal Pain    HPI:   74 year old female presents for left-sided abdominal pain, pressure, bleeding into her bowels. She had an EGD 04/18/2013 with Venia Minks dilation which found normal EGD status post dilation. Recommendations continue Prilosec 20 mg daily. Colonoscopy on the same day for change in bowel habits/constipation found adequate preparation, normal rectum, scattered left-sided diverticula, the remainder of the colonic mucosa appeared normal. Recommendations begin Benefiber 2 teaspoons twice daily, MiraLAX 17 g orally at bedtime as needed for constipation, stop Linzess, repeat colonoscopy in 5 years.  Today she states she's been having stomach pain. Has continued with dark stools since her last visit. Noted also one episode of red blood in stool 2 weeks ago. At her last visit on 03/02/2013 had heme+ stool and anemia, recommended repeat EGD, but she had to cancel this. Stomach pain is in her lower abdomen, denies N/V, has a bowel movement ; Has had 2 episodes of morning cold sweats. Denies fevers, notable change in appetite. Admits weakness and fatigue. Denies unintentional weight loss. Her lower abdominal pain is severe at times, states she's never had pain like that before. Has occasional dysphagia about once a week with dry solid foods with occasional regurgitation. States she doesn't eat a lot when she's alone for fear of choking. Denies any other upper or lower GI symptoms.  Past Medical History  Diagnosis Date  . Coronary atherosclerosis of native coronary artery     Nonobstructive at catheterization 2008 - 40% circumflex  . Environmental allergies   . Shortness of breath     with exertion  . History of bronchitis     >36yrs ago  . Arthritis     back  . History of migraine     last time a few  months ago  . Chronic back pain   . Bruises easily   . History of colon polyps   . Urinary frequency   . Nocturia   . Urinary urgency   . Panic attacks     no meds required  . Insomnia     doesn't take any meds  . Seasonal allergies   . GERD (gastroesophageal reflux disease)     doesn't take any meds from this.  Takes tums as neeeded.  . Breast cancer     Right mastectomy  . Diverticula of colon     Past Surgical History  Procedure Laterality Date  . Abdominal hysterectomy    . Cholecystectomy    . Bladder suspension    . Back surgery  1981/2011/2014  . Breast surgery Right   . Colonoscopy  12/2009    RMR: few pancolonic diverticula. next TCS 12/2014  . Esophagogastroduodenoscopy  06/14/2007    FOY:[id number]-[id number] plaques in esophageal mucosa of uncertain significance brushed and biopsied/Normal stomach, normal first duodenum and second duodenoscopy. KOH negative. esophageal bx c/w GERD  . Foot surgery Right 2014    d/t plantar fascitis  . Breast reconstruction  2004    with abd tissue  . Eye surgery      lasik  . Colonoscopy  06/14/2007    EHM:[id number] rectal polyp, status post cold biopsy removal/ Anal canal hemorrhoids/Left-sided diverticula Colonic mucosa appeared normal.Hyperplastic polyp.   . Colonoscopy  01/18/2004    MOQ:HUTMLYY diverticula/Internal hemorrhoids.  Otherwise normal rectum  .  Colonoscopy N/A 04/18/2013    Dr. Gala Romney- normal rectum, scattered left sided diverticula the remainder of the colonic mucosa appeared normal  . Esophagogastroduodenoscopy (egd) with esophageal dilation N/A 04/18/2013    Dr. Gala Romney- normal, patent appearing, tubular esophagus, normal gastric mucosa, paptent pylorus, normal first and second portion of the duodenum  . Givens capsule study N/A 07/18/2013    L.Lewis PAC- markedly abnormal appearing gastric mucosa, sugnificant bile reflux, questionable subucosal small bowel mass versus extrinsic compression in the distal small bowel.- CTE=  no small bowel mass or tumor seen.    Current Outpatient Prescriptions  Medication Sig Dispense Refill  . acetaminophen (TYLENOL) 500 MG tablet Take 2,000 mg by mouth every 6 (six) hours as needed.    Marland Kitchen amLODipine (NORVASC) 5 MG tablet Take 5 mg by mouth daily.   5  . omeprazole (PRILOSEC OTC) 20 MG tablet Take 20 mg by mouth daily.    . traMADol (ULTRAM) 50 MG tablet Take 50 mg by mouth 2 (two) times daily as needed for moderate pain.   2  . zolpidem (AMBIEN) 5 MG tablet Take 1 tablet by mouth at bedtime as needed for sleep.      No current facility-administered medications for this visit.    Allergies as of 07/25/2014 - Review Complete 07/25/2014  Allergen Reaction Noted  . Aspirin Other (See Comments)   . Penicillins Swelling and Other (See Comments)     Family History  Problem Relation Age of Onset  . Coronary artery disease Mother   . Coronary artery disease Father   . Coronary artery disease Brother   . Cancer Sister     lung cancer, two sisters  . Colon cancer Brother     less than age 23  . Lung cancer Sister   . Cancer Brother     History   Social History  . Marital Status: Married    Spouse Name: [name]  . Number of Children: 4  . Years of Education: 12th   Occupational History  .     Social History Main Topics  . Smoking status: Former Smoker -- 1.50 packs/day for 30 years    Types: Cigarettes    Quit date: 04/28/2000  . Smokeless tobacco: Never Used     Comment: quit about 10yrs ago  . Alcohol Use: No  . Drug Use: No  . Sexual Activity: Not Currently   Other Topics Concern  . None   Social History Narrative   Patient lives at home with spouse.   Caffeine Use: 2 cups of coffee daily    Review of Systems: Gen: Denies fever. Admits chills. Admits fatigue, weakness. Denies weight loss.  CV: Denies chest pain, palpitations, syncope, peripheral edema. Resp: Denies dyspnea at rest, wheezing, coughing up blood. GI: See HPI. Denies vomiting blood,  jaundice, and fecal incontinence.    Derm: Denies rash, itching, dry skin Psych: Denies depression, anxiety, memory loss, confusion.  Heme: Denies bruising, bleeding, and enlarged lymph nodes.  Physical Exam: BP 146/69 mmHg  Pulse 75  Temp(Src) 97.2 F (36.2 C)  Ht 5\' 5"  (1.651 m)  Wt 168 lb 9.6 oz (76.476 kg)  BMI 28.06 kg/m2 General:   Alert and oriented. No distress noted. Pleasant and cooperative.  Head:  Normocephalic and atraumatic. Eyes:  Conjuctiva clear without scleral icterus. Mouth:  Oral mucosa pink and moist. No lesions. No OP edema. Neck:  Supple, without mass or thyromegaly. Lungs:  Clear to auscultation bilaterally. No wheezes, rales, or rhonchi.  No distress.  Heart:  S1, S2 present without murmurs, rubs, or gallops. Regular rate and rhythm. Abdomen:  +BS, soft, and non-distended. Mild to moderate TTP. No rebound or guarding. No HSM or masses noted. Msk:  Symmetrical without gross deformities. Normal posture. Extremities:  Without edema. Neurologic:  Alert and  oriented x4;  grossly normal neurologically. Skin:  Intact without significant lesions or rashes. Cervical Nodes:  No significant cervical adenopathy. Psych:  Alert and cooperative. Normal mood and affect.    07/25/2014 9:40 AM

## 2014-08-21 ENCOUNTER — Encounter (HOSPITAL_COMMUNITY): Payer: Self-pay | Admitting: Internal Medicine

## 2014-08-21 ENCOUNTER — Other Ambulatory Visit: Payer: Self-pay

## 2014-08-21 DIAGNOSIS — R195 Other fecal abnormalities: Secondary | ICD-10-CM

## 2014-08-21 DIAGNOSIS — D649 Anemia, unspecified: Secondary | ICD-10-CM

## 2014-08-21 DIAGNOSIS — R109 Unspecified abdominal pain: Secondary | ICD-10-CM

## 2014-08-22 ENCOUNTER — Encounter: Payer: Self-pay | Admitting: Internal Medicine

## 2014-08-22 DIAGNOSIS — I8312 Varicose veins of left lower extremity with inflammation: Secondary | ICD-10-CM | POA: Diagnosis not present

## 2014-08-22 DIAGNOSIS — M79605 Pain in left leg: Secondary | ICD-10-CM | POA: Diagnosis not present

## 2014-08-24 DIAGNOSIS — I8312 Varicose veins of left lower extremity with inflammation: Secondary | ICD-10-CM | POA: Diagnosis not present

## 2014-08-24 DIAGNOSIS — M79605 Pain in left leg: Secondary | ICD-10-CM | POA: Diagnosis not present

## 2014-08-25 ENCOUNTER — Ambulatory Visit (HOSPITAL_COMMUNITY)
Admission: RE | Admit: 2014-08-25 | Discharge: 2014-08-25 | Disposition: A | Discharge status: Home / Self Care | Payer: Medicare Other | Source: Ambulatory Visit | Attending: Internal Medicine | Admitting: Internal Medicine

## 2014-08-25 ENCOUNTER — Other Ambulatory Visit: Payer: Self-pay

## 2014-08-25 ENCOUNTER — Other Ambulatory Visit: Payer: Self-pay | Admitting: Internal Medicine

## 2014-08-25 DIAGNOSIS — D649 Anemia, unspecified: Secondary | ICD-10-CM | POA: Insufficient documentation

## 2014-08-25 DIAGNOSIS — R9389 Abnormal findings on diagnostic imaging of other specified body structures: Secondary | ICD-10-CM

## 2014-08-25 DIAGNOSIS — K838 Other specified diseases of biliary tract: Secondary | ICD-10-CM | POA: Diagnosis not present

## 2014-08-25 DIAGNOSIS — Z9011 Acquired absence of right breast and nipple: Secondary | ICD-10-CM | POA: Diagnosis not present

## 2014-08-25 DIAGNOSIS — R109 Unspecified abdominal pain: Secondary | ICD-10-CM

## 2014-08-25 DIAGNOSIS — R938 Abnormal findings on diagnostic imaging of other specified body structures: Secondary | ICD-10-CM | POA: Diagnosis not present

## 2014-08-25 DIAGNOSIS — R1030 Lower abdominal pain, unspecified: Secondary | ICD-10-CM | POA: Insufficient documentation

## 2014-08-25 DIAGNOSIS — Z853 Personal history of malignant neoplasm of breast: Secondary | ICD-10-CM | POA: Insufficient documentation

## 2014-08-25 DIAGNOSIS — R195 Other fecal abnormalities: Secondary | ICD-10-CM | POA: Insufficient documentation

## 2014-08-25 DIAGNOSIS — N8110 Cystocele, unspecified: Secondary | ICD-10-CM | POA: Diagnosis not present

## 2014-08-25 LAB — HEPATIC FUNCTION PANEL
ALT: 22 U/L (ref 0–35)
AST: 29 U/L (ref 0–37)
Albumin: 4 g/dL (ref 3.5–5.2)
Alkaline Phosphatase: 79 U/L (ref 39–117)
Bilirubin, Direct: 0.1 mg/dL (ref 0.0–0.3)
Indirect Bilirubin: 0.2 mg/dL (ref 0.2–1.2)
Total Bilirubin: 0.3 mg/dL (ref 0.2–1.2)
Total Protein: 6.9 g/dL (ref 6.0–8.3)

## 2014-08-25 MED ORDER — IOHEXOL 300 MG/ML  SOLN
100.0000 mL | Freq: Once | INTRAMUSCULAR | Status: AC | PRN
  Administered 2014-08-25: 100 mL via INTRAVENOUS

## 2014-08-28 ENCOUNTER — Other Ambulatory Visit: Payer: Self-pay

## 2014-08-28 DIAGNOSIS — K838 Other specified diseases of biliary tract: Secondary | ICD-10-CM

## 2014-08-29 DIAGNOSIS — I8312 Varicose veins of left lower extremity with inflammation: Secondary | ICD-10-CM | POA: Diagnosis not present

## 2014-08-29 DIAGNOSIS — M79605 Pain in left leg: Secondary | ICD-10-CM | POA: Diagnosis not present

## 2014-08-31 DIAGNOSIS — I8312 Varicose veins of left lower extremity with inflammation: Secondary | ICD-10-CM | POA: Diagnosis not present

## 2014-08-31 DIAGNOSIS — M79605 Pain in left leg: Secondary | ICD-10-CM | POA: Diagnosis not present

## 2014-09-14 ENCOUNTER — Ambulatory Visit
Admission: RE | Admit: 2014-09-14 | Discharge: 2014-09-14 | Disposition: A | Discharge status: Home / Self Care | Payer: Medicare Other | Source: Ambulatory Visit | Attending: Internal Medicine | Admitting: Internal Medicine

## 2014-09-14 DIAGNOSIS — R933 Abnormal findings on diagnostic imaging of other parts of digestive tract: Secondary | ICD-10-CM | POA: Diagnosis not present

## 2014-09-14 DIAGNOSIS — K838 Other specified diseases of biliary tract: Secondary | ICD-10-CM | POA: Diagnosis not present

## 2014-09-14 DIAGNOSIS — Z9049 Acquired absence of other specified parts of digestive tract: Secondary | ICD-10-CM | POA: Diagnosis not present

## 2014-09-19 ENCOUNTER — Other Ambulatory Visit: Payer: Self-pay

## 2014-09-19 DIAGNOSIS — R1314 Dysphagia, pharyngoesophageal phase: Secondary | ICD-10-CM

## 2014-10-05 DIAGNOSIS — I8312 Varicose veins of left lower extremity with inflammation: Secondary | ICD-10-CM | POA: Diagnosis not present

## 2014-10-05 DIAGNOSIS — I87322 Chronic venous hypertension (idiopathic) with inflammation of left lower extremity: Secondary | ICD-10-CM | POA: Diagnosis not present

## 2014-10-05 DIAGNOSIS — I83812 Varicose veins of left lower extremities with pain: Secondary | ICD-10-CM | POA: Diagnosis not present

## 2014-10-19 DIAGNOSIS — I8312 Varicose veins of left lower extremity with inflammation: Secondary | ICD-10-CM | POA: Diagnosis not present

## 2014-10-19 DIAGNOSIS — I83812 Varicose veins of left lower extremities with pain: Secondary | ICD-10-CM | POA: Diagnosis not present

## 2014-10-19 DIAGNOSIS — I87322 Chronic venous hypertension (idiopathic) with inflammation of left lower extremity: Secondary | ICD-10-CM | POA: Diagnosis not present

## 2014-10-19 DIAGNOSIS — M7981 Nontraumatic hematoma of soft tissue: Secondary | ICD-10-CM | POA: Diagnosis not present

## 2014-11-02 DIAGNOSIS — N390 Urinary tract infection, site not specified: Secondary | ICD-10-CM | POA: Diagnosis not present

## 2014-11-02 DIAGNOSIS — M79641 Pain in right hand: Secondary | ICD-10-CM | POA: Diagnosis not present

## 2014-11-11 DIAGNOSIS — M79642 Pain in left hand: Secondary | ICD-10-CM | POA: Diagnosis not present

## 2014-11-11 DIAGNOSIS — M79641 Pain in right hand: Secondary | ICD-10-CM | POA: Diagnosis not present

## 2014-11-29 DIAGNOSIS — I8312 Varicose veins of left lower extremity with inflammation: Secondary | ICD-10-CM | POA: Diagnosis not present

## 2014-12-04 ENCOUNTER — Ambulatory Visit: Payer: Medicare Other | Admitting: Diagnostic Neuroimaging

## 2014-12-04 ENCOUNTER — Ambulatory Visit: Payer: Medicare Other | Admitting: Nurse Practitioner

## 2014-12-05 DIAGNOSIS — Z886 Allergy status to analgesic agent status: Secondary | ICD-10-CM | POA: Diagnosis not present

## 2014-12-05 DIAGNOSIS — Z87891 Personal history of nicotine dependence: Secondary | ICD-10-CM | POA: Diagnosis not present

## 2014-12-05 DIAGNOSIS — R932 Abnormal findings on diagnostic imaging of liver and biliary tract: Secondary | ICD-10-CM | POA: Diagnosis not present

## 2014-12-05 DIAGNOSIS — Z9049 Acquired absence of other specified parts of digestive tract: Secondary | ICD-10-CM | POA: Diagnosis not present

## 2014-12-05 DIAGNOSIS — Z79891 Long term (current) use of opiate analgesic: Secondary | ICD-10-CM | POA: Diagnosis not present

## 2014-12-05 DIAGNOSIS — G8929 Other chronic pain: Secondary | ICD-10-CM | POA: Diagnosis not present

## 2014-12-05 DIAGNOSIS — Z880 Allergy status to penicillin: Secondary | ICD-10-CM | POA: Diagnosis not present

## 2014-12-05 DIAGNOSIS — Z86010 Personal history of colonic polyps: Secondary | ICD-10-CM | POA: Diagnosis not present

## 2014-12-05 DIAGNOSIS — R1013 Epigastric pain: Secondary | ICD-10-CM | POA: Diagnosis not present

## 2014-12-05 DIAGNOSIS — Z9889 Other specified postprocedural states: Secondary | ICD-10-CM | POA: Diagnosis not present

## 2014-12-05 DIAGNOSIS — Z79899 Other long term (current) drug therapy: Secondary | ICD-10-CM | POA: Diagnosis not present

## 2014-12-06 DIAGNOSIS — R932 Abnormal findings on diagnostic imaging of liver and biliary tract: Secondary | ICD-10-CM | POA: Insufficient documentation

## 2014-12-21 DIAGNOSIS — Z79899 Other long term (current) drug therapy: Secondary | ICD-10-CM | POA: Diagnosis not present

## 2014-12-21 DIAGNOSIS — R938 Abnormal findings on diagnostic imaging of other specified body structures: Secondary | ICD-10-CM | POA: Diagnosis not present

## 2014-12-21 DIAGNOSIS — R933 Abnormal findings on diagnostic imaging of other parts of digestive tract: Secondary | ICD-10-CM | POA: Diagnosis not present

## 2014-12-21 DIAGNOSIS — I10 Essential (primary) hypertension: Secondary | ICD-10-CM | POA: Diagnosis not present

## 2014-12-21 DIAGNOSIS — K838 Other specified diseases of biliary tract: Secondary | ICD-10-CM | POA: Diagnosis not present

## 2014-12-21 DIAGNOSIS — Z853 Personal history of malignant neoplasm of breast: Secondary | ICD-10-CM | POA: Diagnosis not present

## 2014-12-21 DIAGNOSIS — K219 Gastro-esophageal reflux disease without esophagitis: Secondary | ICD-10-CM | POA: Diagnosis not present

## 2014-12-21 DIAGNOSIS — R1084 Generalized abdominal pain: Secondary | ICD-10-CM | POA: Diagnosis not present

## 2014-12-21 DIAGNOSIS — D649 Anemia, unspecified: Secondary | ICD-10-CM | POA: Diagnosis not present

## 2014-12-21 DIAGNOSIS — Z880 Allergy status to penicillin: Secondary | ICD-10-CM | POA: Diagnosis not present

## 2014-12-21 DIAGNOSIS — Z86010 Personal history of colonic polyps: Secondary | ICD-10-CM | POA: Diagnosis not present

## 2014-12-21 DIAGNOSIS — R932 Abnormal findings on diagnostic imaging of liver and biliary tract: Secondary | ICD-10-CM | POA: Diagnosis not present

## 2014-12-21 DIAGNOSIS — Z886 Allergy status to analgesic agent status: Secondary | ICD-10-CM | POA: Diagnosis not present

## 2014-12-21 DIAGNOSIS — Z87891 Personal history of nicotine dependence: Secondary | ICD-10-CM | POA: Diagnosis not present

## 2014-12-22 DIAGNOSIS — I10 Essential (primary) hypertension: Secondary | ICD-10-CM | POA: Diagnosis not present

## 2014-12-22 DIAGNOSIS — R7301 Impaired fasting glucose: Secondary | ICD-10-CM | POA: Diagnosis not present

## 2014-12-22 DIAGNOSIS — Z0000 Encounter for general adult medical examination without abnormal findings: Secondary | ICD-10-CM | POA: Diagnosis not present

## 2014-12-25 ENCOUNTER — Other Ambulatory Visit (HOSPITAL_COMMUNITY): Payer: Self-pay | Admitting: Internal Medicine

## 2014-12-25 DIAGNOSIS — Z1231 Encounter for screening mammogram for malignant neoplasm of breast: Secondary | ICD-10-CM

## 2014-12-27 ENCOUNTER — Other Ambulatory Visit (HOSPITAL_COMMUNITY): Payer: Self-pay | Admitting: Internal Medicine

## 2014-12-27 DIAGNOSIS — D509 Iron deficiency anemia, unspecified: Secondary | ICD-10-CM | POA: Diagnosis not present

## 2014-12-27 DIAGNOSIS — K219 Gastro-esophageal reflux disease without esophagitis: Secondary | ICD-10-CM | POA: Diagnosis not present

## 2014-12-27 DIAGNOSIS — R7301 Impaired fasting glucose: Secondary | ICD-10-CM | POA: Diagnosis not present

## 2014-12-27 DIAGNOSIS — Z23 Encounter for immunization: Secondary | ICD-10-CM | POA: Diagnosis not present

## 2014-12-27 DIAGNOSIS — M8580 Other specified disorders of bone density and structure, unspecified site: Secondary | ICD-10-CM

## 2015-01-08 DIAGNOSIS — N390 Urinary tract infection, site not specified: Secondary | ICD-10-CM | POA: Diagnosis not present

## 2015-01-10 ENCOUNTER — Ambulatory Visit (HOSPITAL_COMMUNITY)
Admission: RE | Admit: 2015-01-10 | Discharge: 2015-01-10 | Disposition: A | Discharge status: Home / Self Care | Payer: Medicare Other | Source: Ambulatory Visit | Attending: Internal Medicine | Admitting: Internal Medicine

## 2015-01-10 DIAGNOSIS — Z780 Asymptomatic menopausal state: Secondary | ICD-10-CM | POA: Insufficient documentation

## 2015-01-10 DIAGNOSIS — M8580 Other specified disorders of bone density and structure, unspecified site: Secondary | ICD-10-CM

## 2015-01-10 DIAGNOSIS — R29890 Loss of height: Secondary | ICD-10-CM | POA: Diagnosis not present

## 2015-01-10 DIAGNOSIS — Z853 Personal history of malignant neoplasm of breast: Secondary | ICD-10-CM | POA: Insufficient documentation

## 2015-01-10 DIAGNOSIS — Z1231 Encounter for screening mammogram for malignant neoplasm of breast: Secondary | ICD-10-CM | POA: Insufficient documentation

## 2015-02-20 ENCOUNTER — Other Ambulatory Visit: Payer: Self-pay | Admitting: Adult Health

## 2015-02-20 ENCOUNTER — Ambulatory Visit (INDEPENDENT_AMBULATORY_CARE_PROVIDER_SITE_OTHER): Payer: Medicare Other | Admitting: Adult Health

## 2015-02-20 ENCOUNTER — Encounter: Payer: Self-pay | Admitting: Adult Health

## 2015-02-20 VITALS — BP 158/78 | HR 76 | Ht 63.5 in | Wt 163.5 lb

## 2015-02-20 DIAGNOSIS — Z1212 Encounter for screening for malignant neoplasm of rectum: Secondary | ICD-10-CM | POA: Diagnosis not present

## 2015-02-20 DIAGNOSIS — R319 Hematuria, unspecified: Secondary | ICD-10-CM | POA: Insufficient documentation

## 2015-02-20 DIAGNOSIS — N993 Prolapse of vaginal vault after hysterectomy: Secondary | ICD-10-CM

## 2015-02-20 DIAGNOSIS — IMO0002 Reserved for concepts with insufficient information to code with codable children: Secondary | ICD-10-CM | POA: Insufficient documentation

## 2015-02-20 DIAGNOSIS — R102 Pelvic and perineal pain: Secondary | ICD-10-CM

## 2015-02-20 DIAGNOSIS — Z01419 Encounter for gynecological examination (general) (routine) without abnormal findings: Secondary | ICD-10-CM

## 2015-02-20 HISTORY — DX: Prolapse of vaginal vault after hysterectomy: N99.3

## 2015-02-20 LAB — HEMOCCULT GUIAC POC 1CARD (OFFICE): Fecal Occult Blood, POC: NEGATIVE

## 2015-02-20 LAB — POCT URINALYSIS DIPSTICK
Glucose, UA: NEGATIVE
Nitrite, UA: NEGATIVE
Protein, UA: NEGATIVE

## 2015-02-20 NOTE — Patient Instructions (Signed)
Return in 1 day for pessary fitting with Dr Glo Herring

## 2015-02-20 NOTE — Progress Notes (Signed)
Patient ID: [name], female   DOB: [birth year], 74 y.o.   MRN: [id number] History of Present Illness: [name] is a 74 year old white female,sp hysterectomy in for a well woman gyn exam and she is complaining of pelvic pain and pressure.She has had recent UTI and has alternated constipation and diarrhea.Has back pain but has had for years,she has history of breast cancer and is feeling hot at times not like flashes.  PCP is Dr [name] GI is Dr [name]   Current Medications, Allergies, Past Medical History, Past Surgical History, Family History and Social History were reviewed in [facility] record.     Review of Systems: Patient denies any headaches, hearing loss, fatigue, blurred vision, shortness of breath, chest pain,  problems with urination, or intercourse(not having sex). No joint pain or mood swings.See HPI for positives.    Physical Exam:BP 158/78 mmHg  Pulse 76  Ht 5' 3.5" (1.613 m)  Wt 163 lb 8 oz (74.163 kg)  BMI 28.50 kg/m2urine trace leuks and trace blood General:  Well developed, well nourished, no acute distress Skin:  Warm and dry Neck:  Midline trachea, normal thyroid, good ROM, no lymphadenopathy,no carotid bruits  Lungs; Clear to auscultation bilaterally Breast:  No dominant palpable mass, retraction, or nipple discharge has had breast reduction and implant on left. Cardiovascular: Regular rate and rhythm Abdomen:  Soft, non tender, no hepatosplenomegaly Pelvic:  External genitalia is normal in appearance, no lesions.  The vagina has decreased color, moisture and rugae. Urethra has no lesions or masses. The cervix and uterus are absent and she has a cystocele and vaginal vault prolapse. No adnexal masses or tenderness noted.Bladder is non tender, no masses felt. Rectal: Good sphincter tone, no polyps, or hemorrhoids felt.  Hemoccult negative. Slight rectocele  Extremities/musculoskeletal:  No swelling or varicosities noted, no clubbing or  cyanosis Psych:  No mood changes, alert and cooperative,seems happy Has had CT of abd/pelvis in April.  Impression: Well woman gyn exam no pap Pelvic pain  Cystocele Vaginal vault prolapse Hematuria     Plan: UA C&S sent Return in 1 day for pessary fitting with Dr [name]

## 2015-02-21 ENCOUNTER — Ambulatory Visit (INDEPENDENT_AMBULATORY_CARE_PROVIDER_SITE_OTHER): Payer: Medicare Other | Admitting: Obstetrics and Gynecology

## 2015-02-21 ENCOUNTER — Encounter: Payer: Self-pay | Admitting: Obstetrics and Gynecology

## 2015-02-21 VITALS — BP 170/80 | HR 68 | Ht 63.5 in | Wt 164.0 lb

## 2015-02-21 DIAGNOSIS — R102 Pelvic and perineal pain: Secondary | ICD-10-CM

## 2015-02-21 DIAGNOSIS — R109 Unspecified abdominal pain: Secondary | ICD-10-CM

## 2015-02-21 LAB — URINALYSIS, ROUTINE W REFLEX MICROSCOPIC
Bilirubin, UA: NEGATIVE
Glucose, UA: NEGATIVE
Ketones, UA: NEGATIVE
Nitrite, UA: NEGATIVE
Protein, UA: NEGATIVE
RBC, UA: NEGATIVE
Specific Gravity, UA: 1.016 (ref 1.005–1.030)
Urobilinogen, Ur: 0.2 mg/dL (ref 0.2–1.0)
pH, UA: 7 (ref 5.0–7.5)

## 2015-02-21 LAB — MICROSCOPIC EXAMINATION: Casts: NONE SEEN /lpf

## 2015-02-21 NOTE — Progress Notes (Signed)
Patient ID: [name], female   DOB: Oct 17, 1940, 74 y.o.   MRN: [id number]   [facility] Visit  Patient name: [name] MRN [id number]  Date of birth: [birth year] CC & HPI:  [name] is a 74 y.o. female presenting today for pelvic pain and pressure. Associated Sx include urinary frequency (only at night when laying down to go to sleep), nocturia, intermittent constipation, and urinary retention, a sense that she isnt emptying fully. Pt denies any recent fevers, chills. Pt reports she had an UTI that involved hematuria couple of months ago; pt was on a course of antibiotics for one week for the same.  Here today in folllowup of eval by JGriffin NP raising question of support weakness. Exam doesn't show prolapse at present. ROS:  10 Systems reviewed and all are negative for acute change except as noted in the HPI. Pertinent History Reviewed:  Reviewed: Significant for hysterectomy, bladder suspension, urinary frequency, urinary urgency, breast cancer Medical         Past Medical History  Diagnosis Date  . Coronary atherosclerosis of native coronary artery     Nonobstructive at catheterization 2008 - 40% circumflex  . Environmental allergies   . Shortness of breath     with exertion  . History of bronchitis     >57yrs ago  . Arthritis     back  . History of migraine     last time a few months ago  . Chronic back pain   . Bruises easily   . History of colon polyps   . Urinary frequency   . Nocturia   . Urinary urgency   . Panic attacks     no meds required  . Insomnia     doesn't take any meds  . Seasonal allergies   . GERD (gastroesophageal reflux disease)     doesn't take any meds from this.  Takes tums as neeeded.  . Breast cancer ([place])     Right mastectomy  . Diverticula of colon   . Pelvic pain in female 02/20/2015  . Cystocele 02/20/2015  . 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 Hematuria 02/20/2015                               Surgical Hx:    Past Surgical History  Procedure Laterality Date  . Abdominal hysterectomy    . Cholecystectomy    . Bladder suspension    . Back surgery  1981/2011/2014  . Breast surgery Right   . Colonoscopy  12/2009    RMR: few pancolonic diverticula. next TCS 12/2014  . Esophagogastroduodenoscopy  06/14/2007    TIR:[id number] plaques in esophageal mucosa of uncertain significance brushed and biopsied/Normal stomach, normal first duodenum and second duodenoscopy. KOH negative. esophageal bx c/w GERD  . Foot surgery Right 2014    d/t plantar fascitis  . Breast reconstruction  2004    with abd tissue  . Eye surgery      lasik  . Colonoscopy  06/14/2007    PYP:PJKDTOIZTI rectal polyp, status post cold biopsy removal/ Anal canal hemorrhoids/Left-sided diverticula Colonic mucosa appeared normal.Hyperplastic polyp.   . Colonoscopy  01/18/2004    WPY:KDXIPJA diverticula/Internal hemorrhoids.  Otherwise normal rectum  . Colonoscopy N/A 04/18/2013    Dr. Gala Romney- normal rectum, scattered left sided diverticula the remainder of the colonic mucosa appeared normal  . Esophagogastroduodenoscopy (egd) with esophageal dilation N/A  04/18/2013    Dr. Gala Romney- normal, patent appearing, tubular esophagus, normal gastric mucosa, paptent pylorus, normal first and second portion of the duodenum  . Givens capsule study N/A 07/18/2013    L.Lewis PAC- markedly abnormal appearing gastric mucosa, sugnificant bile reflux, questionable subucosal small bowel mass versus extrinsic compression in the distal small bowel.- CTE= no small bowel mass or tumor seen.  . Colonoscopy N/A 08/18/2014    Procedure: COLONOSCOPY;  Surgeon: Daneil Dolin, MD;  Location: [facility];  Service: Endoscopy;  Laterality: N/A;  1000  . Esophagogastroduodenoscopy N/A 08/18/2014    Procedure: ESOPHAGOGASTRODUODENOSCOPY (EGD);  Surgeon: Daneil Dolin, MD;  Location: [facility];  Service: Endoscopy;  Laterality: N/A;  Venia Minks  dilation N/A 08/18/2014    Procedure: Venia Minks DILATION;  Surgeon: Daneil Dolin, MD;  Location: [facility];  Service: Endoscopy;  Laterality: N/A;   Medications: Reviewed & Updated - see associated section                       Current outpatient prescriptions:  .  amLODipine (NORVASC) 5 MG tablet, Take 5 mg by mouth daily. , Disp: , Rfl: 5 .  HYDROcodone-acetaminophen (NORCO) 10-325 MG per tablet, Take 1 tablet by mouth 2 (two) times daily., Disp: , Rfl: 0 .  Multiple Vitamins-Minerals (HAIR/SKIN/NAILS PO), Take by mouth daily., Disp: , Rfl:  .  omeprazole (PRILOSEC OTC) 20 MG tablet, Take 20 mg by mouth daily., Disp: , Rfl:  .  traMADol (ULTRAM) 50 MG tablet, Take 50 mg by mouth 2 (two) times daily as needed for moderate pain. , Disp: , Rfl: 2 .  zolpidem (AMBIEN) 5 MG tablet, Take 1 tablet by mouth at bedtime as needed for sleep. , Disp: , Rfl:    Social History: Reviewed -  reports that she quit smoking about 14 years ago. Her smoking use included Cigarettes. She has a 45 pack-year smoking history. She has never used smokeless tobacco.  Objective Findings:  Vitals: Blood pressure 170/80, pulse 68, height 5' 3.5" (1.613 m), weight 164 lb (74.39 kg). Physical Examination: General appearance - alert, well appearing, and in no distress Mental status - alert, oriented to person, place, and time Abdomen - soft, nondistended, no masses or organomegaly; tender to deep palpation below the TRAM flap site, left greater than right, non tender when abd wall is tight; Transverse, subumbilical scar; status post TRAM flap donor site; no hernias noted.  Pelvic - normal external genitalia, vulva, vagina, cervix, uterus and adnexa,  VULVA: normal appearing vulva with no masses, tenderness or lesions,evidence of prior A&P repair. S/p hyst, snug introitus. VAGINA: normal appearing vagina with normal color and discharge, no lesions, atrophic vaginal tissue  CERVIX: ns, surgically absent,  UTERUS:  surgically absent, anterior wall taught s.p A&P repair,  ADNEXA: surgically absent bilateral, vague, fullness  BLADDER:  Bladder measures: 6.6cmx2.3cmx6.6cm; in and out catheterization completed 30 minutes after voiding, PVR 139ml.   Assessment & Plan:   A:  1. No GYN source to abd pain.  2. Adequate support status post TVH A&P 3. Suspect GI etiology to pain  4. Slight PVR 100 cc confirmed by bladder catherization  5. Trans vaginal and trans abdominal ultrasound completed.   P:  1. Ultrasound of bladder completed PVR 100cc 2. Will discuss with Dr. Nevada Crane re : workup of lower abd pain  3. F/U in three months, GYN recheck.     By signing my name below, I,  Terressa Koyanagi, attest that this documentation has been prepared under the direction and in the presence of Mallory Shirk, MD. Electronically Signed: Terressa Koyanagi, ED Scribe. 02/21/2015. 11:00 AM.   I personally performed the services described in this documentation, which was SCRIBED in my presence. The recorded information has been reviewed and considered accurate. It has been edited as necessary during review. Jonnie Kind, MD

## 2015-02-22 ENCOUNTER — Telehealth: Payer: Self-pay | Admitting: Adult Health

## 2015-02-22 LAB — URINE CULTURE

## 2015-02-22 NOTE — Telephone Encounter (Signed)
Pt aware urine culture did not grow anything

## 2015-02-23 NOTE — Patient Instructions (Signed)
Please followup with Dr Nevada Crane for continued eval of pain.

## 2015-03-06 DIAGNOSIS — H25013 Cortical age-related cataract, bilateral: Secondary | ICD-10-CM | POA: Diagnosis not present

## 2015-03-06 DIAGNOSIS — H2513 Age-related nuclear cataract, bilateral: Secondary | ICD-10-CM | POA: Diagnosis not present

## 2015-03-06 DIAGNOSIS — H53021 Refractive amblyopia, right eye: Secondary | ICD-10-CM | POA: Diagnosis not present

## 2015-03-06 DIAGNOSIS — H524 Presbyopia: Secondary | ICD-10-CM | POA: Diagnosis not present

## 2015-05-03 ENCOUNTER — Telehealth: Payer: Self-pay | Admitting: Adult Health

## 2015-05-03 NOTE — Telephone Encounter (Signed)
Has pelvic pain ?infection to come in am at 9 am

## 2015-05-04 ENCOUNTER — Encounter: Payer: Self-pay | Admitting: Adult Health

## 2015-05-04 ENCOUNTER — Ambulatory Visit (INDEPENDENT_AMBULATORY_CARE_PROVIDER_SITE_OTHER): Payer: Medicare Other | Admitting: Adult Health

## 2015-05-04 VITALS — BP 170/90 | HR 76 | Ht 64.0 in | Wt 165.0 lb

## 2015-05-04 DIAGNOSIS — Z1389 Encounter for screening for other disorder: Secondary | ICD-10-CM

## 2015-05-04 DIAGNOSIS — N8110 Cystocele, unspecified: Secondary | ICD-10-CM

## 2015-05-04 DIAGNOSIS — R102 Pelvic and perineal pain: Secondary | ICD-10-CM

## 2015-05-04 DIAGNOSIS — Z8719 Personal history of other diseases of the digestive system: Secondary | ICD-10-CM

## 2015-05-04 DIAGNOSIS — N993 Prolapse of vaginal vault after hysterectomy: Secondary | ICD-10-CM

## 2015-05-04 DIAGNOSIS — IMO0002 Reserved for concepts with insufficient information to code with codable children: Secondary | ICD-10-CM

## 2015-05-04 DIAGNOSIS — K5900 Constipation, unspecified: Secondary | ICD-10-CM | POA: Diagnosis not present

## 2015-05-04 HISTORY — DX: Personal history of other diseases of the digestive system: Z87.19

## 2015-05-04 LAB — POCT URINALYSIS DIPSTICK
Glucose, UA: NEGATIVE
Leukocytes, UA: NEGATIVE
Nitrite, UA: NEGATIVE
Protein, UA: NEGATIVE

## 2015-05-04 LAB — HEMOCCULT GUIAC POC 1CARD (OFFICE): Fecal Occult Blood, POC: NEGATIVE

## 2015-05-04 MED ORDER — METRONIDAZOLE 500 MG PO TABS: 500.0000 mg | ORAL_TABLET | Freq: Two times a day (BID) | ORAL | Status: DC

## 2015-05-04 MED ORDER — CIPROFLOXACIN HCL 500 MG PO TABS: 500.0000 mg | ORAL_TABLET | Freq: Two times a day (BID) | ORAL | Status: DC

## 2015-05-04 MED ORDER — POLYETHYLENE GLYCOL 3350 17 GM/SCOOP PO POWD: ORAL | Status: DC

## 2015-05-04 NOTE — Progress Notes (Signed)
Subjective:     Patient ID: [name], female   DOB: 1940/10/13, 75 y.o.   MRN: [id number]  HPI Raymonde is a 75 year old white female, married in complaining of pelvic pain and pain in rectum.And is constipated.Has seen Dr Gala Romney in past and had CT in April 2016, told has some diverticulosis then.It hurts to sit.   Review of Systems Patient denies any headaches, hearing loss, fatigue, blurred vision, shortness of breath, chest pain,  problems with urination, or intercourse(not having sex). No joint pain or mood swings.See HPI for positives. Reviewed past medical,surgical, social and family history. Reviewed medications and allergies.     Objective:   Physical Exam BP 170/90 mmHg  Pulse 76  Ht 5\' 4"  (1.626 m)  Wt 165 lb (74.844 kg)  BMI 28.31 kg/m2   Urine trace blood, after exam, Skin warm and dry.Pelvic: external genitalia is normal in appearance for age, no lesions, vagina: is atrophic and short, with + cystocele and vaginal prolapse and still tender to touch, cervix and uterus are absent, adnexa: no masses, + tenderness noted through out. Bladder is ? tender and no masses felt.Rectal exam has no polyps or hemorrhoids, hemoccult negative, has stool in vault. Tried to fit Surveyor, minerals, it was too uncomfortable, had to remove,Dr Eure in for co exam, thinks some pain may be related to diverticulosis, will treat and then re examine in 2 weeks, and try pessary again then, maybe ring with support.  Assessment:     Pelvic pain Vaginal prolapse Cystocele Constipation History of diverticulosis       Plan:     Rx cipro 500 mg Take 1 bid x 14 days #28 no refills Rx flagyl 500 mg take 1 bid x 14 days #28 no refills Try miralax daily Follow up in 2 weeks with me and co exam with Dr Elonda Husky and may try pessary then

## 2015-05-04 NOTE — Patient Instructions (Signed)
Follow up in 2 weeks Take cipro and flagyl  Try miralax

## 2015-05-18 ENCOUNTER — Encounter: Payer: Self-pay | Admitting: Adult Health

## 2015-05-18 ENCOUNTER — Ambulatory Visit (INDEPENDENT_AMBULATORY_CARE_PROVIDER_SITE_OTHER): Payer: Medicare Other | Admitting: Adult Health

## 2015-05-18 VITALS — BP 160/82 | HR 78 | Ht 64.0 in | Wt 164.0 lb

## 2015-05-18 DIAGNOSIS — Z8719 Personal history of other diseases of the digestive system: Secondary | ICD-10-CM | POA: Diagnosis not present

## 2015-05-18 DIAGNOSIS — K6289 Other specified diseases of anus and rectum: Secondary | ICD-10-CM

## 2015-05-18 DIAGNOSIS — R102 Pelvic and perineal pain: Secondary | ICD-10-CM

## 2015-05-18 DIAGNOSIS — IMO0002 Reserved for concepts with insufficient information to code with codable children: Secondary | ICD-10-CM

## 2015-05-18 DIAGNOSIS — G8929 Other chronic pain: Secondary | ICD-10-CM | POA: Diagnosis not present

## 2015-05-18 DIAGNOSIS — N8110 Cystocele, unspecified: Secondary | ICD-10-CM | POA: Diagnosis not present

## 2015-05-18 NOTE — Patient Instructions (Addendum)
Diverticulitis Diverticulitis is inflammation or infection of small pouches in your colon that form when you have a condition called diverticulosis. The pouches in your colon are called diverticula. Your colon, or large intestine, is where water is absorbed and stool is formed. Complications of diverticulitis can include:  Bleeding.  Severe infection.  Severe pain.  Perforation of your colon.  Obstruction of your colon. CAUSES  Diverticulitis is caused by bacteria. Diverticulitis happens when stool becomes trapped in diverticula. This allows bacteria to grow in the diverticula, which can lead to inflammation and infection. RISK FACTORS People with diverticulosis are at risk for diverticulitis. Eating a diet that does not include enough fiber from fruits and vegetables may make diverticulitis more likely to develop. SYMPTOMS  Symptoms of diverticulitis may include:  Abdominal pain and tenderness. The pain is normally located on the left side of the abdomen, but may occur in other areas.  Fever and chills.  Bloating.  Cramping.  Nausea.  Vomiting.  Constipation.  Diarrhea.  Blood in your stool. DIAGNOSIS  Your health care provider will ask you about your medical history and do a physical exam. You may need to have tests done because many medical conditions can cause the same symptoms as diverticulitis. Tests may include:  Blood tests.  Urine tests.  Imaging tests of the abdomen, including X-rays and CT scans. When your condition is under control, your health care provider may recommend that you have a colonoscopy. A colonoscopy can show how severe your diverticula are and whether something else is causing your symptoms. TREATMENT  Most cases of diverticulitis are mild and can be treated at home. Treatment may include:  Taking over-the-counter pain medicines.  Following a clear liquid diet.  Taking antibiotic medicines by mouth for 7-10 days. More severe cases may  be treated at a hospital. Treatment may include:  Not eating or drinking.  Taking prescription pain medicine.  Receiving antibiotic medicines through an IV tube.  Receiving fluids and nutrition through an IV tube.  Surgery. HOME CARE INSTRUCTIONS   Follow your health care provider's instructions carefully.  Follow a full liquid diet or other diet as directed by your health care provider. After your symptoms improve, your health care provider may tell you to change your diet. He or she may recommend you eat a high-fiber diet. Fruits and vegetables are good sources of fiber. Fiber makes it easier to pass stool.  Take fiber supplements or probiotics as directed by your health care provider.  Only take medicines as directed by your health care provider.  Keep all your follow-up appointments. SEEK MEDICAL CARE IF:   Your pain does not improve.  You have a hard time eating food.  Your bowel movements do not return to normal. SEEK IMMEDIATE MEDICAL CARE IF:   Your pain becomes worse.  Your symptoms do not get better.  Your symptoms suddenly get worse.  You have a fever.  You have repeated vomiting.  You have bloody or black, tarry stools. MAKE SURE YOU:   Understand these instructions.  Will watch your condition.  Will get help right away if you are not doing well or get worse.   This information is not intended to replace advice given to you by your health care provider. Make sure you discuss any questions you have with your health care provider.   Document Released: 01/22/2005 Document Revised: 04/19/2013 Document Reviewed: 03/09/2013 Elsevier Interactive Patient Education [employer]. Return in 1 week for pessary fitting

## 2015-05-18 NOTE — Progress Notes (Signed)
Subjective:     Patient ID: [name], female   DOB: December 27, 1940, 75 y.o.   MRN: [id number]  HPI [name] is a 75 year old white female, married back in follow up of taking flagyl and cipro for 2 weeks, she is some better but says still has pain in pelvic area and rectum.For pessary fitting today.  Review of Systems Patient denies any headaches, hearing loss, fatigue, blurred vision, shortness of breath, chest pain,  problems with bowel movements, urination, or intercourse(not having sex). No joint pain or mood swings.See HPI for positives. Reviewed past medical,surgical, social and family history. Reviewed medications and allergies.     Objective:   Physical Exam BP 160/82 mmHg  Pulse 78  Ht 5\' 4"  (1.626 m)  Wt 164 lb (74.39 kg)  BMI 28.14 kg/m2    On exam not as tender, has short vagina, but Dr [name] inserted #2 mylex ring with support and will order.  Assessment:    Pelvic pain Cystocele History of diverticulitis  Rectal pain     Plan:      Dr [name] will order mylex ring with support #2 pessary Return in 1 week for pessary insertion  Review handout on diverticulitis

## 2015-05-24 ENCOUNTER — Ambulatory Visit: Payer: Medicare Other | Admitting: Adult Health

## 2015-05-29 ENCOUNTER — Ambulatory Visit (INDEPENDENT_AMBULATORY_CARE_PROVIDER_SITE_OTHER): Payer: Medicare Other | Admitting: Adult Health

## 2015-05-29 ENCOUNTER — Encounter: Payer: Self-pay | Admitting: Adult Health

## 2015-05-29 VITALS — BP 170/80 | HR 76 | Ht 64.0 in | Wt 165.0 lb

## 2015-05-29 DIAGNOSIS — N8110 Cystocele, unspecified: Secondary | ICD-10-CM | POA: Diagnosis not present

## 2015-05-29 DIAGNOSIS — IMO0002 Reserved for concepts with insufficient information to code with codable children: Secondary | ICD-10-CM

## 2015-05-29 DIAGNOSIS — N993 Prolapse of vaginal vault after hysterectomy: Secondary | ICD-10-CM | POA: Diagnosis not present

## 2015-05-29 DIAGNOSIS — Z4689 Encounter for fitting and adjustment of other specified devices: Secondary | ICD-10-CM | POA: Insufficient documentation

## 2015-05-29 DIAGNOSIS — Z8719 Personal history of other diseases of the digestive system: Secondary | ICD-10-CM

## 2015-05-29 NOTE — Progress Notes (Signed)
Subjective:     Patient ID: [name], female   DOB: [birth year], 75 y.o.   MRN: [id number]  HPI [name] is a 75 year old white female in for pessary insertion, and she says since she finished antibiotics and stopped eating nuts and seeds she feels better, where she had been having bad pelvic pain, and miralax has helped.  Review of Systems For pessary insertion  Patient denies any headaches, hearing loss, fatigue, blurred vision, shortness of breath, chest pain, abdominal pain, problems with bowel movements, urination, or intercourse(not having sex). No joint pain or mood swings.See HPI. Reviewed past medical,surgical, social and family history. Reviewed medications and allergies.     Objective:   Physical Exam BP 170/80 mmHg  Pulse 76  Ht 5\' 4"  (1.626 m)  Wt 165 lb (74.844 kg)  BMI 28.31 kg/m2   # 2 milex ring pessary with support inserted in vagina, and Dr Elonda Husky in for co exam, she says it feels better now. Instructed if it falls out, wash it off and bring in for reinsertion if desired. Assessment:     Pessary insertion Prolapse of vaginal vault Cystocele    History of diverticulosis   Plan:     Follow up in 4 weeks for recheck Try probiotic [place]  given handout on diverticulitis

## 2015-05-29 NOTE — Patient Instructions (Signed)
Follow up in 4 weeks  Try [place] probiotic

## 2015-06-25 DIAGNOSIS — I10 Essential (primary) hypertension: Secondary | ICD-10-CM | POA: Diagnosis not present

## 2015-06-25 DIAGNOSIS — R7301 Impaired fasting glucose: Secondary | ICD-10-CM | POA: Diagnosis not present

## 2015-06-26 ENCOUNTER — Ambulatory Visit: Payer: Medicare Other | Admitting: Adult Health

## 2015-07-03 DIAGNOSIS — I10 Essential (primary) hypertension: Secondary | ICD-10-CM | POA: Diagnosis not present

## 2015-07-03 DIAGNOSIS — D509 Iron deficiency anemia, unspecified: Secondary | ICD-10-CM | POA: Diagnosis not present

## 2015-07-03 DIAGNOSIS — K219 Gastro-esophageal reflux disease without esophagitis: Secondary | ICD-10-CM | POA: Diagnosis not present

## 2015-07-03 DIAGNOSIS — R7301 Impaired fasting glucose: Secondary | ICD-10-CM | POA: Diagnosis not present

## 2015-09-05 DIAGNOSIS — R1030 Lower abdominal pain, unspecified: Secondary | ICD-10-CM | POA: Diagnosis not present

## 2015-09-05 DIAGNOSIS — R531 Weakness: Secondary | ICD-10-CM | POA: Diagnosis not present

## 2015-09-05 DIAGNOSIS — G629 Polyneuropathy, unspecified: Secondary | ICD-10-CM | POA: Diagnosis not present

## 2015-09-05 DIAGNOSIS — R5383 Other fatigue: Secondary | ICD-10-CM | POA: Diagnosis not present

## 2015-09-05 DIAGNOSIS — R100 Acute abdomen: Secondary | ICD-10-CM | POA: Diagnosis not present

## 2015-09-28 IMAGING — CT CT ABD-PELV W/ CM
2 of 5 series · 16 of 46 positions shown, 18 images · IV contrast (Omnipaque 300)
Comparison: 08/03/2013

CLINICAL DATA: Bilateral lower abdominal pain for 18 months.
Heme-positive stool. Anemia. Remote history of right mastectomy for
breast cancer

EXAM:
CT ABDOMEN AND PELVIS WITH CONTRAST
TECHNIQUE: Multidetector CT imaging of the abdomen and pelvis was performed
using the standard protocol following bolus administration of
intravenous contrast.
CONTRAST:  100mL OMNIPAQUE IOHEXOL 300 MG/ML  SOLN

[Series 2: abd_pel_with 5.0 b40f · axial · 0.71mm/px · z∈[+740,+1126]mm · 13 of 87 slices shown, 15 images]
[im 5/87  soft-tissue]
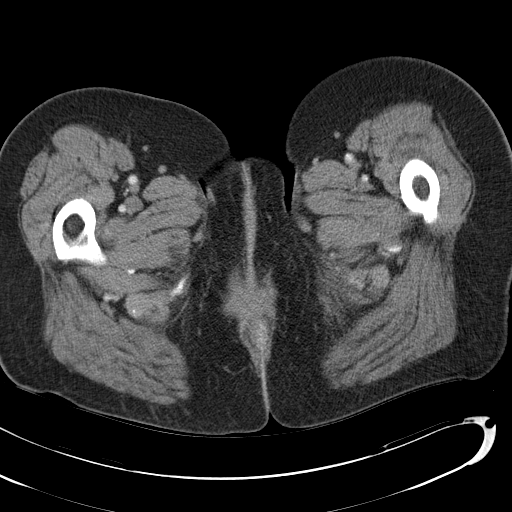
[im 5/87  bone]
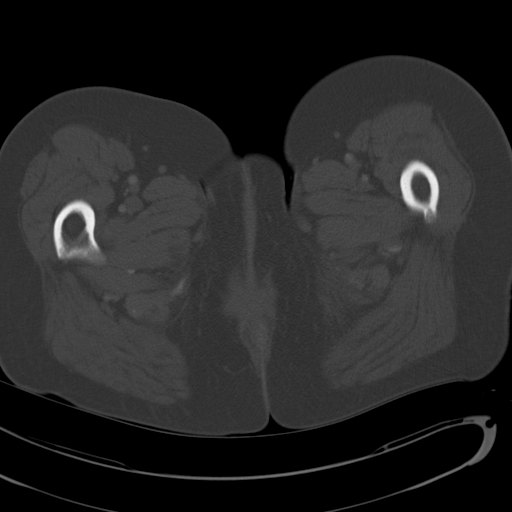
[im 14/87  soft-tissue]
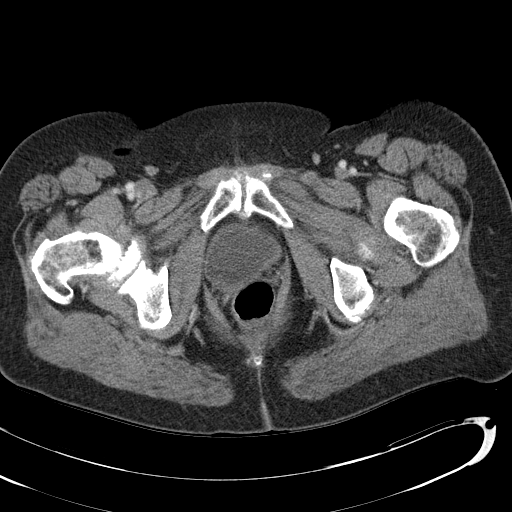
[im 19/87  soft-tissue]
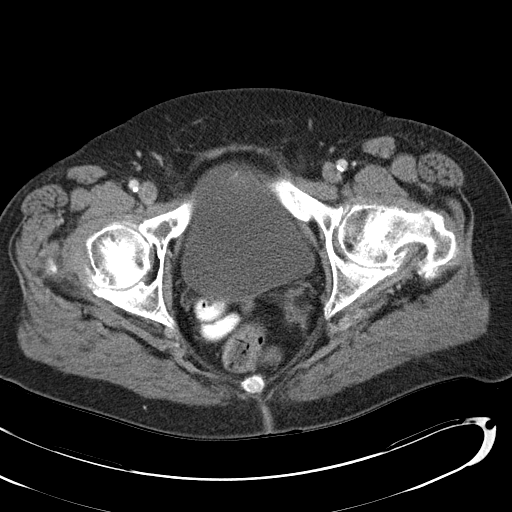
[im 23/87  soft-tissue]
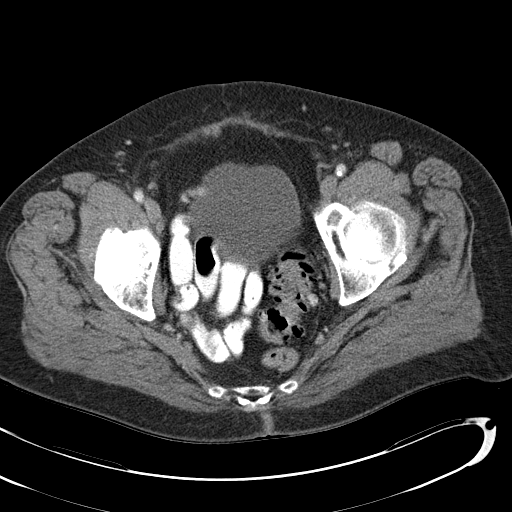
[im 32/87  soft-tissue]
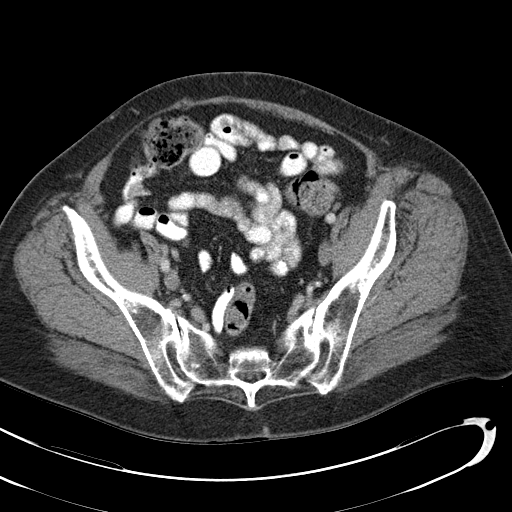
[im 37/87  soft-tissue]
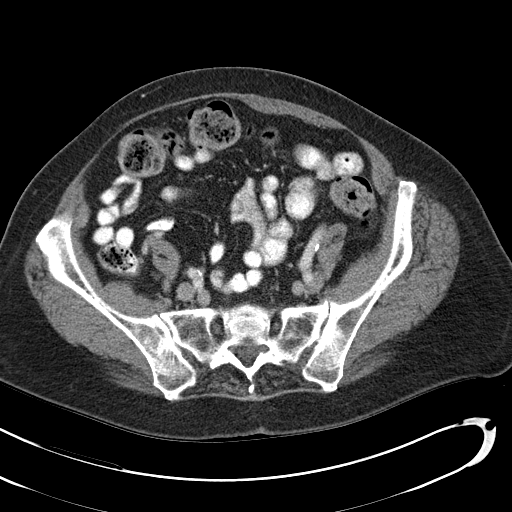
[im 46/87  soft-tissue]
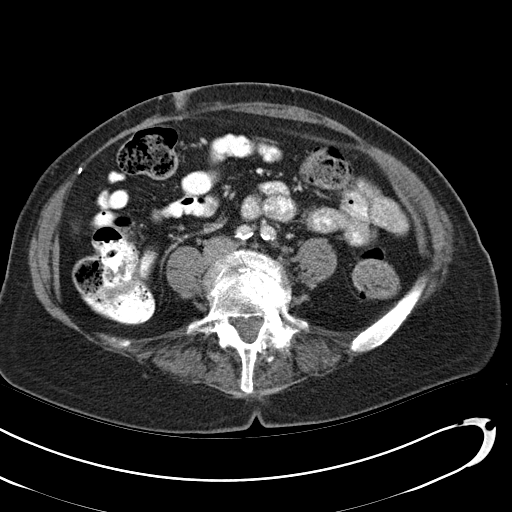
[im 50/87  soft-tissue]
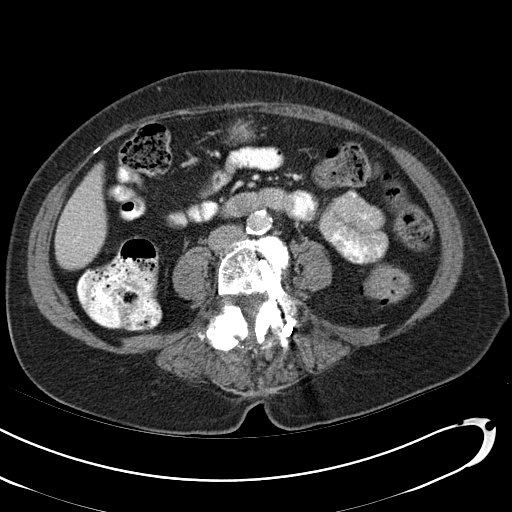
[im 55/87  soft-tissue]
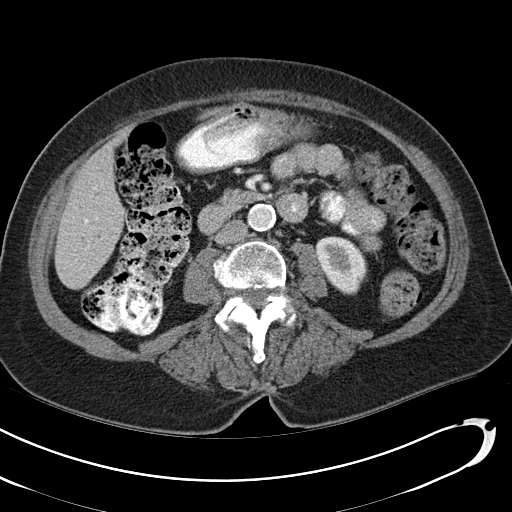
[im 55/87  bone]
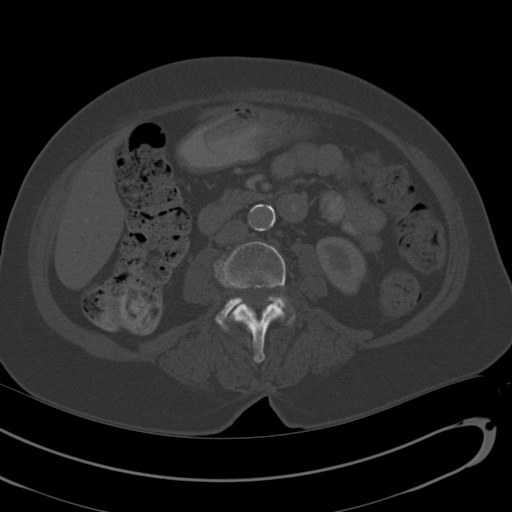
[im 64/87  soft-tissue]
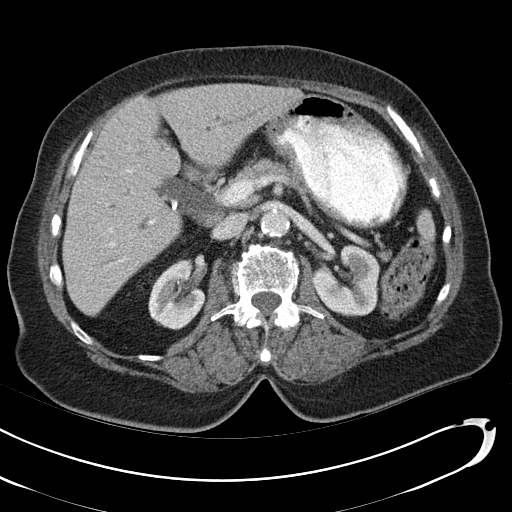
[im 68/87  soft-tissue]
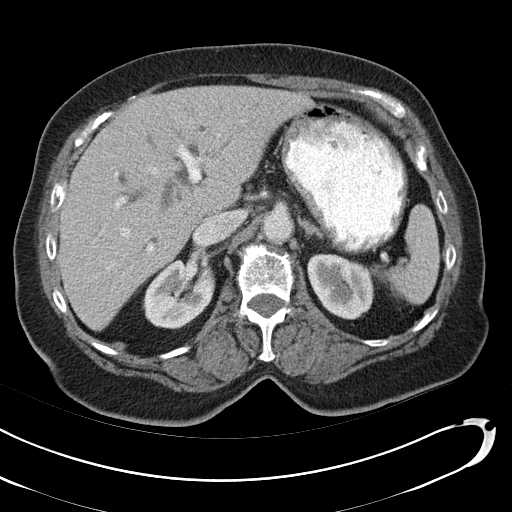
[im 73/87  soft-tissue]
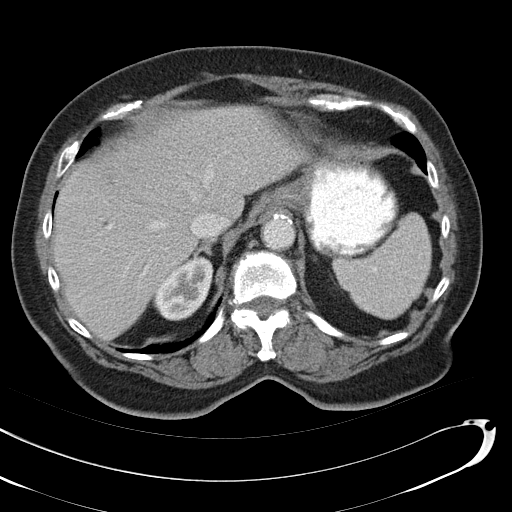
[im 82/87  soft-tissue]
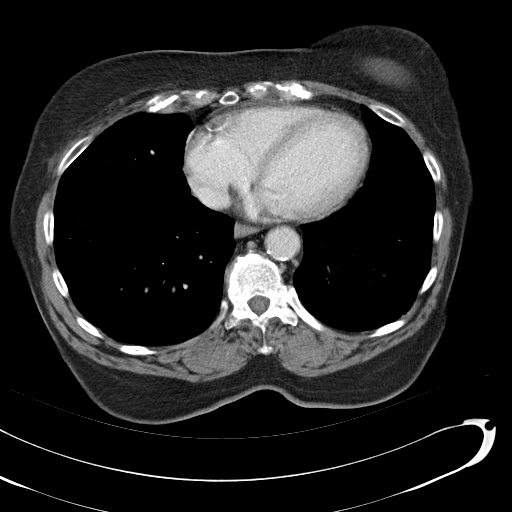

[Series 3: abd_pel_with 3.0 spo cor · coronal · 0.79mm/px · 3 of 86 slices shown]
[im 29/86  soft-tissue]
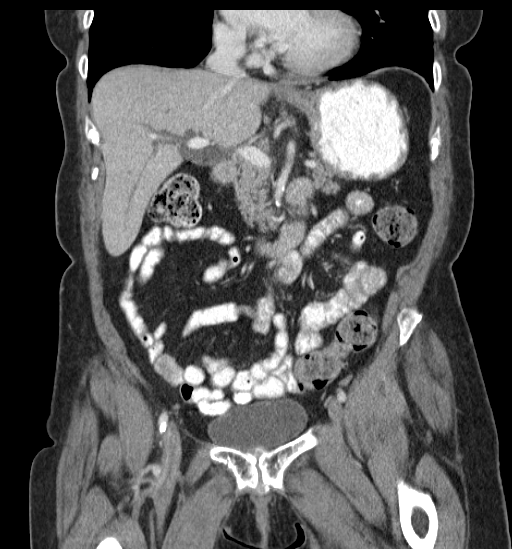
[im 38/86  soft-tissue]
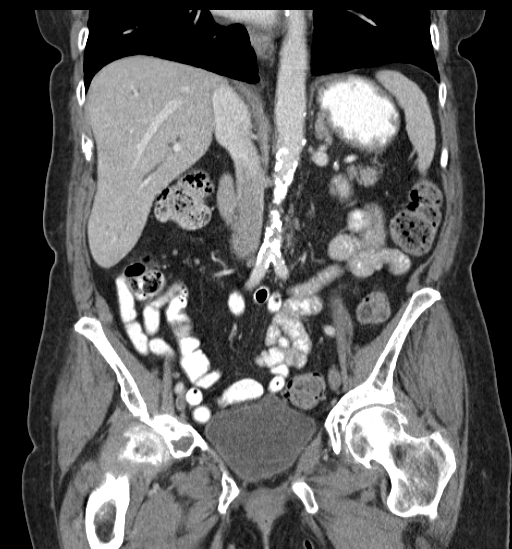
[im 48/86  soft-tissue]
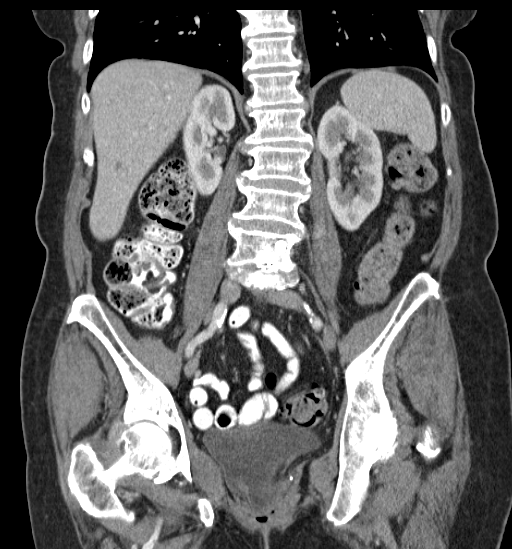

[16 of 46 positions shown; findings below may reference images not displayed]

FINDINGS: Lower chest: Left breast implant partly visualized. Evidence of
right tram flap partly visualized. Minimal subpleural scarring or
atelectasis noted.

Hepatobiliary: Moderate central intrahepatic ductal dilatation is
identified with common duct dilatation to 1.5 cm maximally,
increased from previous diameter of 1.2 cm at its midportion on the
prior exam. Tapering to the level of the ampulla is identified and
no radiopaque filling defect is identified. Subjective increase in
intrahepatic ductal dilatation since previously. Cholecystectomy
clips are noted.

Pancreas: Normal

Spleen: Normal

Adrenals/Urinary Tract: Adrenal glands are normal. No
hydroureteronephrosis or focal renal cortical abnormality. No
radiopaque renal, ureteral, or bladder calculus. The bladder is
unremarkable with the exception of small cyst physeal reidentified.

Stomach/Bowel: Stomach appears normal. No bowel wall thickening or
focal segmental dilatation is identified. Normal appendix. Colonic
diverticuli noted without evidence for diverticulitis.

Vascular/Lymphatic: Moderate atheromatous aortic calcification noted
without aneurysm. No lymphadenopathy.

Reproductive: Uterus and ovaries surgically absent. Evidence of
pelvic laxity reidentified with descent of the rectum and bladder
below the level of the pubococcygeal line by approximately 2.2 cm.

Other: No free air or fluid.

Musculoskeletal: Hip and spine degenerative change noted with
evidence of posterior decompression and fusion spanning L5-S1. 1 cm
anterolisthesis of L5 on S1 noted. No acute osseous abnormality.
IMPRESSION: Increase in intrahepatic and extrahepatic biliary ductal dilation
with tapering to the level of the ampulla. However the change from
the 08/03/2013 prior exam raises the question of possible stone,
stricture, or mass. Consider further evaluation at [facility] with
contrast for further evaluation. These results will be called to the
ordering clinician or representative by the Radiologist Assistant,
and communication documented in the PACS or zVision Dashboard.

No acute intra-abdominal or pelvic pathology.

Cystocele with pelvic laxity reidentified.

## 2015-10-16 DIAGNOSIS — M533 Sacrococcygeal disorders, not elsewhere classified: Secondary | ICD-10-CM | POA: Diagnosis not present

## 2015-10-16 DIAGNOSIS — M545 Low back pain: Secondary | ICD-10-CM | POA: Diagnosis not present

## 2015-10-17 ENCOUNTER — Other Ambulatory Visit: Payer: Self-pay | Admitting: Orthopedic Surgery

## 2015-10-17 DIAGNOSIS — M533 Sacrococcygeal disorders, not elsewhere classified: Secondary | ICD-10-CM

## 2015-10-25 ENCOUNTER — Ambulatory Visit
Admission: RE | Admit: 2015-10-25 | Discharge: 2015-10-25 | Disposition: A | Discharge status: Home / Self Care | Payer: Medicare Other | Source: Ambulatory Visit | Attending: Orthopedic Surgery | Admitting: Orthopedic Surgery

## 2015-10-25 DIAGNOSIS — M533 Sacrococcygeal disorders, not elsewhere classified: Secondary | ICD-10-CM

## 2015-10-25 DIAGNOSIS — M25552 Pain in left hip: Secondary | ICD-10-CM | POA: Diagnosis not present

## 2015-10-25 MED ORDER — METHYLPREDNISOLONE ACETATE 40 MG/ML INJ SUSP (RADIOLOG
120.0000 mg | Freq: Once | INTRAMUSCULAR | Status: AC
  Administered 2015-10-25: 120 mg via INTRA_ARTICULAR

## 2015-10-25 MED ORDER — IOPAMIDOL (ISOVUE-M 200) INJECTION 41%
1.0000 mL | Freq: Once | INTRAMUSCULAR | Status: AC
  Administered 2015-10-25: 1 mL via INTRA_ARTICULAR

## 2015-11-16 DIAGNOSIS — G8929 Other chronic pain: Secondary | ICD-10-CM | POA: Diagnosis not present

## 2015-11-16 DIAGNOSIS — F5101 Primary insomnia: Secondary | ICD-10-CM | POA: Diagnosis not present

## 2015-12-20 DIAGNOSIS — M1612 Unilateral primary osteoarthritis, left hip: Secondary | ICD-10-CM | POA: Diagnosis not present

## 2015-12-20 DIAGNOSIS — S83207A Unspecified tear of unspecified meniscus, current injury, left knee, initial encounter: Secondary | ICD-10-CM | POA: Diagnosis not present

## 2016-01-14 ENCOUNTER — Other Ambulatory Visit (HOSPITAL_COMMUNITY): Payer: Self-pay | Admitting: Internal Medicine

## 2016-01-14 DIAGNOSIS — Z1231 Encounter for screening mammogram for malignant neoplasm of breast: Secondary | ICD-10-CM

## 2016-01-15 DIAGNOSIS — E782 Mixed hyperlipidemia: Secondary | ICD-10-CM | POA: Diagnosis not present

## 2016-01-15 DIAGNOSIS — D509 Iron deficiency anemia, unspecified: Secondary | ICD-10-CM | POA: Diagnosis not present

## 2016-01-15 DIAGNOSIS — R7301 Impaired fasting glucose: Secondary | ICD-10-CM | POA: Diagnosis not present

## 2016-01-17 DIAGNOSIS — Z23 Encounter for immunization: Secondary | ICD-10-CM | POA: Diagnosis not present

## 2016-01-17 DIAGNOSIS — E782 Mixed hyperlipidemia: Secondary | ICD-10-CM | POA: Diagnosis not present

## 2016-01-17 DIAGNOSIS — D509 Iron deficiency anemia, unspecified: Secondary | ICD-10-CM | POA: Diagnosis not present

## 2016-01-17 DIAGNOSIS — I10 Essential (primary) hypertension: Secondary | ICD-10-CM | POA: Diagnosis not present

## 2016-01-17 DIAGNOSIS — K219 Gastro-esophageal reflux disease without esophagitis: Secondary | ICD-10-CM | POA: Diagnosis not present

## 2016-01-17 DIAGNOSIS — R7301 Impaired fasting glucose: Secondary | ICD-10-CM | POA: Diagnosis not present

## 2016-01-17 DIAGNOSIS — Z0000 Encounter for general adult medical examination without abnormal findings: Secondary | ICD-10-CM | POA: Diagnosis not present

## 2016-01-24 ENCOUNTER — Ambulatory Visit (HOSPITAL_COMMUNITY)
Admission: RE | Admit: 2016-01-24 | Discharge: 2016-01-24 | Disposition: A | Discharge status: Home / Self Care | Payer: Medicare Other | Source: Ambulatory Visit | Attending: Internal Medicine | Admitting: Internal Medicine

## 2016-01-24 DIAGNOSIS — Z1231 Encounter for screening mammogram for malignant neoplasm of breast: Secondary | ICD-10-CM | POA: Diagnosis not present

## 2016-02-13 ENCOUNTER — Other Ambulatory Visit: Payer: Self-pay | Admitting: Orthopedic Surgery

## 2016-02-15 ENCOUNTER — Encounter (HOSPITAL_COMMUNITY): Payer: Self-pay

## 2016-02-15 NOTE — Pre-Procedure Instructions (Signed)
[name]  02/15/2016      [facility] - [address], [place], [place] - [place] [address] Phone: [phone] Fax: [phone]    Your procedure is scheduled on Friday Novermber 3 .  Report to [facility] Admitting at 8:00 A.M.  Call this number if you have problems the morning of surgery:  [phone]   Remember:  Do not eat food or drink liquids after midnight.  Take these medicines the morning of surgery with A SIP OF WATER: amlodipine (norvasc), hydrocodone (Norco) OR tramadol (ultram) if needed   7 days prior to surgery STOP taking any Aspirin, Aleve, Naproxen, Ibuprofen, Motrin, Advil, Goody's, BC's, all herbal medications, fish oil, and all vitamins    Do not wear jewelry, make-up or nail polish.  Do not wear lotions, powders, or perfumes, or deoderant.  Do not shave 48 hours prior to surgery.  Men may shave face and neck.  Do not bring valuables to the hospital.  [facility] is not responsible for any belongings or valuables.  Contacts, dentures or bridgework may not be worn into surgery.  Leave your suitcase in the car.  After surgery it may be brought to your room.  For patients admitted to the hospital, discharge time will be determined by your treatment team.  Patients discharged the day of surgery will not be allowed to drive home.    Special instructions:    [facility]- Preparing For Surgery  Before surgery, you can play an important role. Because skin is not sterile, your skin needs to be as free of germs as possible. You can reduce the number of germs on your skin by washing with CHG (chlorahexidine gluconate) Soap before surgery.  CHG is an antiseptic cleaner which kills germs and bonds with the skin to continue killing germs even after washing.  Please do not use if you have an allergy to CHG or antibacterial soaps. If your skin becomes reddened/irritated stop using the CHG.  Do not shave (including  legs and underarms) for at least 48 hours prior to first CHG shower. It is OK to shave your face.  Please follow these instructions carefully.   1. Shower the NIGHT BEFORE SURGERY and the MORNING OF SURGERY with CHG.   2. If you chose to wash your hair, wash your hair first as usual with your normal shampoo.  3. After you shampoo, rinse your hair and body thoroughly to remove the shampoo.  4. Use CHG as you would any other liquid soap. You can apply CHG directly to the skin and wash gently with a scrungie or a clean washcloth.   5. Apply the CHG Soap to your body ONLY FROM THE NECK DOWN.  Do not use on open wounds or open sores. Avoid contact with your eyes, ears, mouth and genitals (private parts). Wash genitals (private parts) with your normal soap.  6. Wash thoroughly, paying special attention to the area where your surgery will be performed.  7. Thoroughly rinse your body with warm water from the neck down.  8. DO NOT shower/wash with your normal soap after using and rinsing off the CHG Soap.  9. Pat yourself dry with a CLEAN TOWEL.   10. Wear CLEAN PAJAMAS   11. Place CLEAN SHEETS on your bed the night of your first shower and DO NOT SLEEP WITH PETS.    Day of Surgery: Do not apply any deodorants/lotions. Please wear clean  clothes to the hospital/surgery center.      Please read over the following fact sheets that you were given. MRSA Information

## 2016-02-18 ENCOUNTER — Encounter (HOSPITAL_COMMUNITY)
Admission: RE | Admit: 2016-02-18 | Discharge: 2016-02-18 | Disposition: A | Discharge status: Home / Self Care | Payer: Medicare Other | Source: Ambulatory Visit | Attending: Orthopedic Surgery | Admitting: Orthopedic Surgery

## 2016-02-18 ENCOUNTER — Ambulatory Visit (HOSPITAL_COMMUNITY)
Admission: RE | Admit: 2016-02-18 | Discharge: 2016-02-18 | Disposition: A | Discharge status: Home / Self Care | Payer: Medicare Other | Source: Ambulatory Visit | Attending: Orthopedic Surgery | Admitting: Orthopedic Surgery

## 2016-02-18 ENCOUNTER — Encounter (HOSPITAL_COMMUNITY): Payer: Self-pay

## 2016-02-18 DIAGNOSIS — Z01818 Encounter for other preprocedural examination: Secondary | ICD-10-CM | POA: Insufficient documentation

## 2016-02-18 DIAGNOSIS — M1612 Unilateral primary osteoarthritis, left hip: Secondary | ICD-10-CM | POA: Insufficient documentation

## 2016-02-18 DIAGNOSIS — Z01812 Encounter for preprocedural laboratory examination: Secondary | ICD-10-CM | POA: Diagnosis not present

## 2016-02-18 DIAGNOSIS — Z01810 Encounter for preprocedural cardiovascular examination: Secondary | ICD-10-CM | POA: Diagnosis not present

## 2016-02-18 DIAGNOSIS — J449 Chronic obstructive pulmonary disease, unspecified: Secondary | ICD-10-CM | POA: Diagnosis not present

## 2016-02-18 HISTORY — DX: Anemia, unspecified: D64.9

## 2016-02-18 HISTORY — DX: Renal tubulo-interstitial disease, unspecified: N15.9

## 2016-02-18 LAB — CBC WITH DIFFERENTIAL/PLATELET
Basophils Absolute: 0.1 10*3/uL (ref 0.0–0.1)
Basophils Relative: 1 %
Eosinophils Absolute: 0.3 10*3/uL (ref 0.0–0.7)
Eosinophils Relative: 3 %
HCT: 35.4 % — ABNORMAL LOW (ref 36.0–46.0)
Hemoglobin: 11.9 g/dL — ABNORMAL LOW (ref 12.0–15.0)
Lymphocytes Relative: 52 %
Lymphs Abs: 5.1 10*3/uL — ABNORMAL HIGH (ref 0.7–4.0)
MCH: 30.7 pg (ref 26.0–34.0)
MCHC: 33.6 g/dL (ref 30.0–36.0)
MCV: 91.5 fL (ref 78.0–100.0)
Monocytes Absolute: 0.5 10*3/uL (ref 0.1–1.0)
Monocytes Relative: 5 %
Neutro Abs: 3.8 10*3/uL (ref 1.7–7.7)
Neutrophils Relative %: 39 %
Platelets: 333 10*3/uL (ref 150–400)
RBC: 3.87 MIL/uL (ref 3.87–5.11)
RDW: 12.7 % (ref 11.5–15.5)
WBC: 9.8 10*3/uL (ref 4.0–10.5)

## 2016-02-18 LAB — URINALYSIS, ROUTINE W REFLEX MICROSCOPIC
Bilirubin Urine: NEGATIVE
Glucose, UA: NEGATIVE mg/dL
Hgb urine dipstick: NEGATIVE
Ketones, ur: NEGATIVE mg/dL
Nitrite: NEGATIVE
Protein, ur: NEGATIVE mg/dL
Specific Gravity, Urine: 1.017 (ref 1.005–1.030)
pH: 6.5 (ref 5.0–8.0)

## 2016-02-18 LAB — BASIC METABOLIC PANEL
Anion gap: 9 (ref 5–15)
BUN: 23 mg/dL — ABNORMAL HIGH (ref 6–20)
CO2: 23 mmol/L (ref 22–32)
Calcium: 10.3 mg/dL (ref 8.9–10.3)
Chloride: 106 mmol/L (ref 101–111)
Creatinine, Ser: 1.07 mg/dL — ABNORMAL HIGH (ref 0.44–1.00)
GFR calc Af Amer: 57 mL/min — ABNORMAL LOW (ref >60)
GFR calc non Af Amer: 49 mL/min — ABNORMAL LOW (ref >60)
Glucose, Bld: 104 mg/dL — ABNORMAL HIGH (ref 65–99)
Potassium: 3.9 mmol/L (ref 3.5–5.1)
Sodium: 138 mmol/L (ref 135–145)

## 2016-02-18 LAB — URINE MICROSCOPIC-ADD ON
Bacteria, UA: NONE SEEN
RBC / HPF: NONE SEEN RBC/hpf (ref 0–5)

## 2016-02-18 LAB — PROTIME-INR
INR: 0.99
Prothrombin Time: 13.1 seconds (ref 11.4–15.2)

## 2016-02-18 LAB — TYPE AND SCREEN
ABO/RH(D): A NEG
Antibody Screen: NEGATIVE

## 2016-02-18 LAB — APTT: aPTT: 26 seconds (ref 24–36)

## 2016-02-18 LAB — SURGICAL PCR SCREEN
MRSA, PCR: NEGATIVE
Staphylococcus aureus: NEGATIVE

## 2016-02-18 NOTE — Progress Notes (Signed)
PCP: Delphina Cahill, MD No cardiologist Pt states she had a heart cath years ago due to an abnormal EKG, everything was normal per pt but she does not remember who performed CATH.   EKG: 02/18/16 CXR: 02/18/16 Echo and stress test: 01/27/12  No complaints of chest pain, SOB, signs of infection at PAT appointment.

## 2016-02-20 DIAGNOSIS — R1084 Generalized abdominal pain: Secondary | ICD-10-CM | POA: Diagnosis not present

## 2016-02-22 DIAGNOSIS — M1612 Unilateral primary osteoarthritis, left hip: Secondary | ICD-10-CM | POA: Diagnosis present

## 2016-02-28 MED ORDER — TRANEXAMIC ACID 1000 MG/10ML IV SOLN
2000.0000 mg | Freq: Once | INTRAVENOUS | Status: AC
  Administered 2016-02-29: 2000 mg via TOPICAL
  Filled 2016-02-28: qty 20

## 2016-02-28 MED ORDER — TRANEXAMIC ACID 1000 MG/10ML IV SOLN
1000.0000 mg | INTRAVENOUS | Status: DC
  Filled 2016-02-28 (×2): qty 10

## 2016-02-28 MED ORDER — BUPIVACAINE LIPOSOME 1.3 % IJ SUSP
20.0000 mL | Freq: Once | INTRAMUSCULAR | Status: AC
  Administered 2016-02-29: 20 mL
  Filled 2016-02-28: qty 20

## 2016-02-28 NOTE — H&P (Signed)
TOTAL HIP ADMISSION H&P  Patient is admitted for left total hip arthroplasty.  Subjective:  Chief Complaint: left hip pain  HPI: [name], 75 y.o. female, has a history of pain and functional disability in the left hip(s) due to arthritis and patient has failed non-surgical conservative treatments for greater than 12 weeks to include NSAID's and/or analgesics, flexibility and strengthening excercises, use of assistive devices, weight reduction as appropriate and activity modification.  Onset of symptoms was gradual starting 2 years ago with gradually worsening course since that time.The patient noted no past surgery on the left hip(s).  Patient currently rates pain in the left hip at 10 out of 10 with activity. Patient has night pain, worsening of pain with activity and weight bearing, trendelenberg gait, pain that interfers with activities of daily living and pain with passive range of motion. Patient has evidence of periarticular osteophytes and joint space narrowing by imaging studies. This condition presents safety issues increasing the risk of falls.  There is no current active infection.  Patient Active Problem List   Diagnosis Date Noted  . Primary osteoarthritis of left hip 02/22/2016  . Fitting and adjustment of pessary 05/29/2015  . Rectal pain, chronic 05/18/2015  . History of diverticulosis 05/04/2015  . Pelvic pain in female 02/20/2015  . Cystocele 02/20/2015  . 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 Hematuria 02/20/2015  . Abnormal findings on diagnostic imaging of liver and biliary tract 12/06/2014  . Dysphagia, pharyngoesophageal phase   . Mucosal abnormality of stomach   . Diverticulosis of colon without hemorrhage   . Rectal bleeding 07/25/2014  . Left sided abdominal pain 03/01/2014  . Heme positive stool 03/01/2014  . Esophageal dysphagia 04/04/2013  . Anemia 04/04/2013  . Abnormal LFTs 04/04/2013  . Change in bowel habits 04/04/2013  .  Constipation 04/04/2013  . 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 Degeneration of intervertebral disc, site unspecified 12/15/2012  . Shortness of breath 01/14/2012  . Hypertensive heart disease 01/14/2012  . Coronary atherosclerosis of native coronary artery 12/14/2009  . ASTHMA 12/14/2009  . GERD 12/14/2009  . BREAST CANCER, HX OF 12/14/2009   Past Medical History:  Diagnosis Date  . Anemia    "little bit"  . Arthritis    back  . Breast cancer ([place])    Right mastectomy  . Bruises easily   . Chronic back pain   . Coronary atherosclerosis of native coronary artery    Nonobstructive at catheterization 2008 - 40% circumflex  . Cystocele 02/20/2015  . Diverticula of colon   . Environmental allergies   . Fitting and adjustment of pessary 05/29/2015   Inserted milex ring with support #2   . GERD (gastroesophageal reflux disease)    doesn't take any meds from this.  Takes tums as neeeded.  . Hematuria 02/20/2015  . History of bronchitis    >48yrs ago  . History of colon polyps   . History of diverticulosis 05/04/2015  . History of migraine    last time a few months ago  . Insomnia    doesn't take any meds  . Kidney infection    history of  . Nocturia   . Panic attacks    no meds required  . Pelvic pain in female 02/20/2015  . 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 Rectal pain, chronic 05/18/2015  . Seasonal allergies   . Shortness of breath    with exertion  . Urinary frequency   .  Urinary urgency     Past Surgical History:  Procedure Laterality Date  . ABDOMINAL HYSTERECTOMY    . BACK SURGERY  1981/2011/2014  . BLADDER SUSPENSION    . BREAST RECONSTRUCTION  2004   with abd tissue  . BREAST SURGERY Right   . CHOLECYSTECTOMY    . COLONOSCOPY  12/2009   RMR: few pancolonic diverticula. next TCS 12/2014  . COLONOSCOPY  06/14/2007   [id number] rectal polyp, status post cold biopsy removal/ Anal canal hemorrhoids/Left-sided  diverticula Colonic mucosa appeared normal.Hyperplastic polyp.   . COLONOSCOPY  01/18/2004   [id number] diverticula/Internal hemorrhoids.  Otherwise normal rectum  . COLONOSCOPY N/A 04/18/2013   Dr. Gala Romney- normal rectum, scattered left sided diverticula the remainder of the colonic mucosa appeared normal  . COLONOSCOPY N/A 08/18/2014   Procedure: COLONOSCOPY;  Surgeon: Daneil Dolin, MD;  Location: [facility];  Service: Endoscopy;  Laterality: N/A;  1000  . ESOPHAGOGASTRODUODENOSCOPY  06/14/2007   [id number] plaques in esophageal mucosa of uncertain significance brushed and biopsied/Normal stomach, normal first duodenum and second duodenoscopy. KOH negative. esophageal bx c/w GERD  . ESOPHAGOGASTRODUODENOSCOPY N/A 08/18/2014   Procedure: ESOPHAGOGASTRODUODENOSCOPY (EGD);  Surgeon: Daneil Dolin, MD;  Location: [facility];  Service: Endoscopy;  Laterality: N/A;  . ESOPHAGOGASTRODUODENOSCOPY (EGD) WITH ESOPHAGEAL DILATION N/A 04/18/2013   Dr. Gala Romney- normal, patent appearing, tubular esophagus, normal gastric mucosa, paptent pylorus, normal first and second portion of the duodenum  . EYE SURGERY     lasik  . FOOT SURGERY Right 2014   d/t plantar fascitis  . GIVENS CAPSULE STUDY N/A 07/18/2013   L.Lewis PAC- markedly abnormal appearing gastric mucosa, sugnificant bile reflux, questionable subucosal small bowel mass versus extrinsic compression in the distal small bowel.- CTE= no small bowel mass or tumor seen.  Marland Kitchen MALONEY DILATION N/A 08/18/2014   Procedure: Venia Minks DILATION;  Surgeon: Daneil Dolin, MD;  Location: [facility];  Service: Endoscopy;  Laterality: N/A;  . MASTECTOMY     right side 12-13 years ago    No prescriptions prior to admission.   Allergies  Allergen Reactions  . Penicillins Anaphylaxis, Swelling and Other (See Comments)    Blisters Has patient had a PCN reaction causing immediate rash, facial/tongue/throat swelling, SOB or lightheadedness with hypotension:  Yes Has patient had a PCN reaction causing severe rash involving mucus membranes or skin necrosis: No Has patient had a PCN reaction that required hospitalization No Has patient had a PCN reaction occurring within the last 10 years: No If all of the above answers are "NO", then may proceed with Cephalosporin use.   . Aspirin Other (See Comments)    Causes hemoptysis    Social History  Substance Use Topics  . Smoking status: Former Smoker    Packs/day: 1.50    Years: 30.00    Types: Cigarettes    Quit date: 04/28/2000  . Smokeless tobacco: Never Used     Comment: quit about 47yrs ago  . Alcohol use No    Family History  Problem Relation Age of Onset  . Coronary artery disease Mother   . Coronary artery disease Father   . Coronary artery disease Brother   . Cancer Sister     lung cancer, two sisters  . Colon cancer Brother     less than age 64  . Lung cancer Sister   . Cancer Brother   . Emphysema Brother      Review of Systems  Constitutional: Negative.   HENT: Negative.  Eyes: Negative.   Respiratory: Negative.   Cardiovascular: Negative.   Gastrointestinal: Positive for abdominal pain, blood in stool and constipation.  Genitourinary: Positive for hematuria.  Musculoskeletal: Positive for joint pain.  Skin: Negative.   Neurological: Negative.   Endo/Heme/Allergies: Negative.   Psychiatric/Behavioral: Negative.     Objective:  Physical Exam  Constitutional: She is oriented to person, place, and time. She appears well-developed and well-nourished.  HENT:  Head: Normocephalic and atraumatic.  Eyes: Pupils are equal, round, and reactive to light.  Neck: Normal range of motion. Neck supple.  Cardiovascular: Intact distal pulses.   Respiratory: Effort normal.  Musculoskeletal: She exhibits tenderness.  Patient walks a left-sided limp significant pain with attempts at internal rotation of the left hip, foot tap is negative.  Tender along the medial joint line of  the left knee which has a full range of motion, no palpable effusion, collateral ligaments are stable and Lachman's test is negative.  Neurological: She is alert and oriented to person, place, and time.  Skin: Skin is warm and dry.  Psychiatric: She has a normal mood and affect. Her behavior is normal. Judgment and thought content normal.    Vital signs in last 24 hours:    Labs:   Estimated body mass index is 26.27 kg/m as calculated from the following:   Height as of 02/18/16: 5' 5.5" (1.664 m).   Weight as of 02/18/16: 72.7 kg (160 lb 4.8 oz).   Imaging Review Plain radiographs demonstrate AP pelvis and crosstable lateral left hip shows approaching end-stage arthritis of the left hip with subchondral cysts in the femoral head peripheral osteophytes and loss of articular cartilage greater than 50%.  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  Assessment/Plan:  End stage arthritis, left hip(s)  The patient history, physical examination, clinical judgement of the provider and imaging studies are consistent with end stage degenerative joint disease of the left hip(s) and total hip arthroplasty is deemed medically necessary. The treatment options including medical management, injection therapy, arthroscopy and arthroplasty were discussed at length. The risks and benefits of total hip arthroplasty were presented and reviewed. The risks due to aseptic loosening, infection, stiffness, dislocation/subluxation,  thromboembolic complications and other imponderables were discussed.  The patient acknowledged the explanation, agreed to proceed with the plan and consent was signed. Patient is being admitted for inpatient treatment for surgery, pain control, PT, OT, prophylactic antibiotics, VTE prophylaxis, progressive ambulation and ADL's and discharge planning.The patient is planning to be discharged home with  home health services

## 2016-02-29 ENCOUNTER — Encounter (HOSPITAL_COMMUNITY)
Admission: RE | Disposition: A | Discharge status: Home / Self Care | Payer: Self-pay | Source: Ambulatory Visit | Attending: Orthopedic Surgery

## 2016-02-29 ENCOUNTER — Inpatient Hospital Stay (HOSPITAL_COMMUNITY): Payer: Medicare Other

## 2016-02-29 ENCOUNTER — Inpatient Hospital Stay (HOSPITAL_COMMUNITY): Payer: Medicare Other | Admitting: Anesthesiology

## 2016-02-29 ENCOUNTER — Encounter (HOSPITAL_COMMUNITY): Payer: Self-pay | Admitting: Anesthesiology

## 2016-02-29 ENCOUNTER — Inpatient Hospital Stay (HOSPITAL_COMMUNITY)
Admission: RE | Admit: 2016-02-29 | Discharge: 2016-03-03 | DRG: 470 | Disposition: A | Discharge status: Home / Self Care | Payer: Medicare Other | Source: Ambulatory Visit | Attending: Orthopedic Surgery | Admitting: Orthopedic Surgery

## 2016-02-29 DIAGNOSIS — Z972 Presence of dental prosthetic device (complete) (partial): Secondary | ICD-10-CM

## 2016-02-29 DIAGNOSIS — I119 Hypertensive heart disease without heart failure: Secondary | ICD-10-CM | POA: Diagnosis not present

## 2016-02-29 DIAGNOSIS — M1612 Unilateral primary osteoarthritis, left hip: Secondary | ICD-10-CM | POA: Diagnosis not present

## 2016-02-29 DIAGNOSIS — D62 Acute posthemorrhagic anemia: Secondary | ICD-10-CM | POA: Diagnosis not present

## 2016-02-29 DIAGNOSIS — G8929 Other chronic pain: Secondary | ICD-10-CM | POA: Diagnosis not present

## 2016-02-29 DIAGNOSIS — Z86010 Personal history of colonic polyps: Secondary | ICD-10-CM

## 2016-02-29 DIAGNOSIS — Z90710 Acquired absence of both cervix and uterus: Secondary | ICD-10-CM | POA: Diagnosis not present

## 2016-02-29 DIAGNOSIS — K5790 Diverticulosis of intestine, part unspecified, without perforation or abscess without bleeding: Secondary | ICD-10-CM | POA: Diagnosis present

## 2016-02-29 DIAGNOSIS — Z9049 Acquired absence of other specified parts of digestive tract: Secondary | ICD-10-CM

## 2016-02-29 DIAGNOSIS — Z800 Family history of malignant neoplasm of digestive organs: Secondary | ICD-10-CM | POA: Diagnosis not present

## 2016-02-29 DIAGNOSIS — K5900 Constipation, unspecified: Secondary | ICD-10-CM | POA: Diagnosis present

## 2016-02-29 DIAGNOSIS — Z801 Family history of malignant neoplasm of trachea, bronchus and lung: Secondary | ICD-10-CM

## 2016-02-29 DIAGNOSIS — Z87891 Personal history of nicotine dependence: Secondary | ICD-10-CM

## 2016-02-29 DIAGNOSIS — Z471 Aftercare following joint replacement surgery: Secondary | ICD-10-CM | POA: Diagnosis not present

## 2016-02-29 DIAGNOSIS — R1314 Dysphagia, pharyngoesophageal phase: Secondary | ICD-10-CM | POA: Diagnosis not present

## 2016-02-29 DIAGNOSIS — N993 Prolapse of vaginal vault after hysterectomy: Secondary | ICD-10-CM | POA: Diagnosis present

## 2016-02-29 DIAGNOSIS — Z419 Encounter for procedure for purposes other than remedying health state, unspecified: Secondary | ICD-10-CM

## 2016-02-29 DIAGNOSIS — Z880 Allergy status to penicillin: Secondary | ICD-10-CM

## 2016-02-29 DIAGNOSIS — M549 Dorsalgia, unspecified: Secondary | ICD-10-CM | POA: Diagnosis not present

## 2016-02-29 DIAGNOSIS — Z853 Personal history of malignant neoplasm of breast: Secondary | ICD-10-CM | POA: Diagnosis not present

## 2016-02-29 DIAGNOSIS — Z9011 Acquired absence of right breast and nipple: Secondary | ICD-10-CM | POA: Diagnosis not present

## 2016-02-29 DIAGNOSIS — I2510 Atherosclerotic heart disease of native coronary artery without angina pectoris: Secondary | ICD-10-CM | POA: Diagnosis not present

## 2016-02-29 DIAGNOSIS — K219 Gastro-esophageal reflux disease without esophagitis: Secondary | ICD-10-CM | POA: Diagnosis not present

## 2016-02-29 DIAGNOSIS — Z825 Family history of asthma and other chronic lower respiratory diseases: Secondary | ICD-10-CM | POA: Diagnosis not present

## 2016-02-29 DIAGNOSIS — Z96642 Presence of left artificial hip joint: Secondary | ICD-10-CM | POA: Diagnosis not present

## 2016-02-29 DIAGNOSIS — Z8249 Family history of ischemic heart disease and other diseases of the circulatory system: Secondary | ICD-10-CM

## 2016-02-29 DIAGNOSIS — R319 Hematuria, unspecified: Secondary | ICD-10-CM | POA: Diagnosis not present

## 2016-02-29 DIAGNOSIS — M25552 Pain in left hip: Secondary | ICD-10-CM | POA: Diagnosis present

## 2016-02-29 DIAGNOSIS — Z7901 Long term (current) use of anticoagulants: Secondary | ICD-10-CM | POA: Diagnosis not present

## 2016-02-29 HISTORY — PX: TOTAL HIP ARTHROPLASTY: SHX124

## 2016-02-29 SURGERY — ARTHROPLASTY, HIP, TOTAL, ANTERIOR APPROACH
Anesthesia: General | Site: Hip | Laterality: Left

## 2016-02-29 MED ORDER — LIDOCAINE-EPINEPHRINE (PF) 1 %-1:200000 IJ SOLN
INTRAMUSCULAR | Status: AC
  Filled 2016-02-29: qty 30

## 2016-02-29 MED ORDER — FENTANYL CITRATE (PF) 100 MCG/2ML IJ SOLN
INTRAMUSCULAR | Status: AC
  Filled 2016-02-29: qty 2

## 2016-02-29 MED ORDER — BISACODYL 5 MG PO TBEC
5.0000 mg | DELAYED_RELEASE_TABLET | Freq: Every day | ORAL | Status: DC | PRN
  Administered 2016-03-03: 5 mg via ORAL
  Filled 2016-02-29: qty 1

## 2016-02-29 MED ORDER — PROPOFOL 10 MG/ML IV BOLUS
INTRAVENOUS | Status: DC | PRN
  Administered 2016-02-29: 120 mg via INTRAVENOUS

## 2016-02-29 MED ORDER — POLYETHYLENE GLYCOL 3350 17 G PO PACK
17.0000 g | PACK | Freq: Every day | ORAL | Status: DC | PRN
  Filled 2016-02-29: qty 1

## 2016-02-29 MED ORDER — ONDANSETRON HCL 4 MG/2ML IJ SOLN: 4.0000 mg | Freq: Four times a day (QID) | INTRAMUSCULAR | Status: DC | PRN

## 2016-02-29 MED ORDER — DIPHENHYDRAMINE HCL 12.5 MG/5ML PO ELIX
12.5000 mg | ORAL_SOLUTION | ORAL | Status: DC | PRN
  Administered 2016-02-29: 25 mg via ORAL
  Filled 2016-02-29: qty 10

## 2016-02-29 MED ORDER — ACETAMINOPHEN 325 MG PO TABS: 650.0000 mg | ORAL_TABLET | Freq: Four times a day (QID) | ORAL | Status: DC | PRN

## 2016-02-29 MED ORDER — 0.9 % SODIUM CHLORIDE (POUR BTL) OPTIME
TOPICAL | Status: DC | PRN
  Administered 2016-02-29: 1000 mL

## 2016-02-29 MED ORDER — GLYCOPYRROLATE 0.2 MG/ML IJ SOLN
INTRAMUSCULAR | Status: DC | PRN
  Administered 2016-02-29: .2 mg via INTRAVENOUS

## 2016-02-29 MED ORDER — OXYCODONE HCL 5 MG/5ML PO SOLN: 5.0000 mg | Freq: Once | ORAL | Status: AC | PRN

## 2016-02-29 MED ORDER — MENTHOL 3 MG MT LOZG: 1.0000 | LOZENGE | OROMUCOSAL | Status: DC | PRN

## 2016-02-29 MED ORDER — APIXABAN 2.5 MG PO TABS
2.5000 mg | ORAL_TABLET | Freq: Two times a day (BID) | ORAL | Status: DC
  Administered 2016-03-01 – 2016-03-03 (×5): 2.5 mg via ORAL
  Filled 2016-02-29 (×5): qty 1

## 2016-02-29 MED ORDER — TIZANIDINE HCL 2 MG PO TABS: 2.0000 mg | ORAL_TABLET | Freq: Four times a day (QID) | ORAL | 0 refills | Status: DC | PRN

## 2016-02-29 MED ORDER — GABAPENTIN 300 MG PO CAPS
300.0000 mg | ORAL_CAPSULE | Freq: Three times a day (TID) | ORAL | Status: DC
  Administered 2016-02-29 – 2016-03-03 (×7): 300 mg via ORAL
  Filled 2016-02-29 (×8): qty 1

## 2016-02-29 MED ORDER — HYDROMORPHONE HCL 1 MG/ML IJ SOLN
0.2500 mg | INTRAMUSCULAR | Status: DC | PRN
  Administered 2016-02-29 (×4): 0.5 mg via INTRAVENOUS

## 2016-02-29 MED ORDER — METOCLOPRAMIDE HCL 5 MG PO TABS: 5.0000 mg | ORAL_TABLET | Freq: Three times a day (TID) | ORAL | Status: DC | PRN

## 2016-02-29 MED ORDER — DEXAMETHASONE SODIUM PHOSPHATE 10 MG/ML IJ SOLN
INTRAMUSCULAR | Status: AC
  Filled 2016-02-29: qty 1

## 2016-02-29 MED ORDER — DEXAMETHASONE SODIUM PHOSPHATE 10 MG/ML IJ SOLN
10.0000 mg | Freq: Once | INTRAMUSCULAR | Status: AC
  Administered 2016-03-01: 10 mg via INTRAVENOUS
  Filled 2016-02-29: qty 1

## 2016-02-29 MED ORDER — ONDANSETRON HCL 4 MG/2ML IJ SOLN
INTRAMUSCULAR | Status: AC
  Filled 2016-02-29: qty 2

## 2016-02-29 MED ORDER — LIDOCAINE HCL (CARDIAC) 20 MG/ML IV SOLN
INTRAVENOUS | Status: DC | PRN
  Administered 2016-02-29: 70 mg via INTRAVENOUS

## 2016-02-29 MED ORDER — METHOCARBAMOL 1000 MG/10ML IJ SOLN
500.0000 mg | INTRAVENOUS | Status: AC
  Administered 2016-02-29: 500 mg via INTRAVENOUS
  Filled 2016-02-29: qty 5

## 2016-02-29 MED ORDER — BUPIVACAINE HCL (PF) 0.5 % IJ SOLN
INTRAMUSCULAR | Status: DC | PRN
  Administered 2016-02-29: 25 mL

## 2016-02-29 MED ORDER — KCL IN DEXTROSE-NACL 20-5-0.45 MEQ/L-%-% IV SOLN
INTRAVENOUS | Status: DC
  Administered 2016-02-29 (×2): via INTRAVENOUS
  Filled 2016-02-29 (×2): qty 1000

## 2016-02-29 MED ORDER — GLYCOPYRROLATE 0.2 MG/ML IV SOSY
PREFILLED_SYRINGE | INTRAVENOUS | Status: AC
  Filled 2016-02-29: qty 9

## 2016-02-29 MED ORDER — HYDROMORPHONE HCL 1 MG/ML IJ SOLN: 1.0000 mg | INTRAMUSCULAR | Status: DC | PRN

## 2016-02-29 MED ORDER — FLEET ENEMA 7-19 GM/118ML RE ENEM: 1.0000 | ENEMA | Freq: Once | RECTAL | Status: DC | PRN

## 2016-02-29 MED ORDER — ACETAMINOPHEN 650 MG RE SUPP: 650.0000 mg | Freq: Four times a day (QID) | RECTAL | Status: DC | PRN

## 2016-02-29 MED ORDER — FENTANYL CITRATE (PF) 100 MCG/2ML IJ SOLN
INTRAMUSCULAR | Status: DC | PRN
  Administered 2016-02-29 (×6): 50 ug via INTRAVENOUS
  Administered 2016-02-29: 100 ug via INTRAVENOUS

## 2016-02-29 MED ORDER — AMLODIPINE BESYLATE 5 MG PO TABS
5.0000 mg | ORAL_TABLET | Freq: Every day | ORAL | Status: DC
  Administered 2016-03-02 – 2016-03-03 (×2): 5 mg via ORAL
  Filled 2016-02-29 (×3): qty 1

## 2016-02-29 MED ORDER — ONDANSETRON HCL 4 MG PO TABS: 4.0000 mg | ORAL_TABLET | Freq: Four times a day (QID) | ORAL | Status: DC | PRN

## 2016-02-29 MED ORDER — CHLORHEXIDINE GLUCONATE 4 % EX LIQD: 60.0000 mL | Freq: Once | CUTANEOUS | Status: DC

## 2016-02-29 MED ORDER — ONDANSETRON HCL 4 MG/2ML IJ SOLN
INTRAMUSCULAR | Status: DC | PRN
  Administered 2016-02-29: 4 mg via INTRAVENOUS

## 2016-02-29 MED ORDER — BUPIVACAINE HCL (PF) 0.5 % IJ SOLN
INTRAMUSCULAR | Status: AC
  Filled 2016-02-29: qty 30

## 2016-02-29 MED ORDER — DEXAMETHASONE SODIUM PHOSPHATE 4 MG/ML IJ SOLN
INTRAMUSCULAR | Status: DC | PRN
  Administered 2016-02-29: 8 mg via INTRAVENOUS

## 2016-02-29 MED ORDER — HYDROMORPHONE HCL 2 MG/ML IJ SOLN
INTRAMUSCULAR | Status: AC
  Filled 2016-02-29: qty 1

## 2016-02-29 MED ORDER — DEXTROSE-NACL 5-0.45 % IV SOLN: INTRAVENOUS | Status: DC

## 2016-02-29 MED ORDER — ROCURONIUM BROMIDE 100 MG/10ML IV SOLN
INTRAVENOUS | Status: DC | PRN
  Administered 2016-02-29: 10 mg via INTRAVENOUS
  Administered 2016-02-29: 40 mg via INTRAVENOUS

## 2016-02-29 MED ORDER — VANCOMYCIN HCL IN DEXTROSE 1-5 GM/200ML-% IV SOLN
1000.0000 mg | INTRAVENOUS | Status: AC
  Administered 2016-02-29 (×2): 1000 mg via INTRAVENOUS
  Filled 2016-02-29: qty 200

## 2016-02-29 MED ORDER — PHENOL 1.4 % MT LIQD: 1.0000 | OROMUCOSAL | Status: DC | PRN

## 2016-02-29 MED ORDER — METOCLOPRAMIDE HCL 5 MG/ML IJ SOLN: 5.0000 mg | Freq: Three times a day (TID) | INTRAMUSCULAR | Status: DC | PRN

## 2016-02-29 MED ORDER — ALUMINUM HYDROXIDE GEL 320 MG/5ML PO SUSP
15.0000 mL | ORAL | Status: DC | PRN
  Filled 2016-02-29 (×2): qty 30

## 2016-02-29 MED ORDER — MIDAZOLAM HCL 5 MG/5ML IJ SOLN
INTRAMUSCULAR | Status: DC | PRN
  Administered 2016-02-29: 2 mg via INTRAVENOUS

## 2016-02-29 MED ORDER — SUGAMMADEX SODIUM 200 MG/2ML IV SOLN
INTRAVENOUS | Status: AC
  Filled 2016-02-29: qty 2

## 2016-02-29 MED ORDER — MIDAZOLAM HCL 2 MG/2ML IJ SOLN
INTRAMUSCULAR | Status: AC
  Filled 2016-02-29: qty 2

## 2016-02-29 MED ORDER — HYDROMORPHONE HCL 2 MG/ML IJ SOLN
1.0000 mg | INTRAMUSCULAR | Status: DC | PRN
  Administered 2016-03-01 – 2016-03-02 (×4): 1 mg via INTRAVENOUS
  Filled 2016-02-29 (×4): qty 1

## 2016-02-29 MED ORDER — LACTATED RINGERS IV SOLN
INTRAVENOUS | Status: DC
  Administered 2016-02-29 (×2): via INTRAVENOUS

## 2016-02-29 MED ORDER — OXYCODONE HCL 5 MG PO TABS
ORAL_TABLET | ORAL | Status: AC
  Filled 2016-02-29: qty 1

## 2016-02-29 MED ORDER — OXYCODONE-ACETAMINOPHEN 5-325 MG PO TABS: 1.0000 | ORAL_TABLET | ORAL | 0 refills | Status: DC | PRN

## 2016-02-29 MED ORDER — APIXABAN 2.5 MG PO TABS: 2.5000 mg | ORAL_TABLET | Freq: Two times a day (BID) | ORAL | 0 refills | Status: DC

## 2016-02-29 MED ORDER — DOCUSATE SODIUM 100 MG PO CAPS
100.0000 mg | ORAL_CAPSULE | Freq: Two times a day (BID) | ORAL | Status: DC
  Administered 2016-02-29 – 2016-03-03 (×7): 100 mg via ORAL
  Filled 2016-02-29 (×7): qty 1

## 2016-02-29 MED ORDER — PHENYLEPHRINE HCL 10 MG/ML IJ SOLN
INTRAVENOUS | Status: DC | PRN
  Administered 2016-02-29: 25 ug/min via INTRAVENOUS

## 2016-02-29 MED ORDER — METHOCARBAMOL 1000 MG/10ML IJ SOLN
500.0000 mg | Freq: Four times a day (QID) | INTRAVENOUS | Status: DC | PRN
  Filled 2016-02-29: qty 5

## 2016-02-29 MED ORDER — ACETAMINOPHEN 10 MG/ML IV SOLN
1000.0000 mg | INTRAVENOUS | Status: AC
  Administered 2016-02-29: 1000 mg via INTRAVENOUS
  Filled 2016-02-29: qty 100

## 2016-02-29 MED ORDER — OXYCODONE HCL 5 MG PO TABS
5.0000 mg | ORAL_TABLET | Freq: Once | ORAL | Status: AC | PRN
  Administered 2016-02-29: 5 mg via ORAL

## 2016-02-29 MED ORDER — SUGAMMADEX SODIUM 200 MG/2ML IV SOLN
INTRAVENOUS | Status: DC | PRN
  Administered 2016-02-29: 150 mg via INTRAVENOUS

## 2016-02-29 MED ORDER — ROCURONIUM BROMIDE 10 MG/ML (PF) SYRINGE
PREFILLED_SYRINGE | INTRAVENOUS | Status: AC
  Filled 2016-02-29: qty 10

## 2016-02-29 MED ORDER — OXYCODONE HCL 5 MG PO TABS
5.0000 mg | ORAL_TABLET | ORAL | Status: DC | PRN
  Administered 2016-02-29 – 2016-03-03 (×17): 10 mg via ORAL
  Filled 2016-02-29 (×17): qty 2

## 2016-02-29 MED ORDER — LIDOCAINE 2% (20 MG/ML) 5 ML SYRINGE
INTRAMUSCULAR | Status: AC
  Filled 2016-02-29: qty 5

## 2016-02-29 MED ORDER — METHOCARBAMOL 500 MG PO TABS
500.0000 mg | ORAL_TABLET | Freq: Four times a day (QID) | ORAL | Status: DC | PRN
  Administered 2016-02-29 – 2016-03-03 (×8): 500 mg via ORAL
  Filled 2016-02-29 (×9): qty 1

## 2016-02-29 SURGICAL SUPPLY — 50 items
BAG DECANTER FOR FLEXI CONT (MISCELLANEOUS) ×3 IMPLANT
BLADE SURG ROTATE 9660 (MISCELLANEOUS) IMPLANT
CAPT HIP TOTAL 2 ×2 IMPLANT
COVER PERINEAL POST (MISCELLANEOUS) ×3 IMPLANT
COVER SURGICAL LIGHT HANDLE (MISCELLANEOUS) ×3 IMPLANT
DECANTER SPIKE VIAL GLASS SM (MISCELLANEOUS) ×2 IMPLANT
DRAPE C-ARM 42X72 X-RAY (DRAPES) ×3 IMPLANT
DRAPE STERI IOBAN 125X83 (DRAPES) ×3 IMPLANT
DRAPE U-SHAPE 47X51 STRL (DRAPES) ×6 IMPLANT
DRSG AQUACEL AG ADV 3.5X 4 (GAUZE/BANDAGES/DRESSINGS) ×2 IMPLANT
DRSG AQUACEL AG ADV 3.5X10 (GAUZE/BANDAGES/DRESSINGS) ×3 IMPLANT
DURAPREP 26ML APPLICATOR (WOUND CARE) ×3 IMPLANT
ELECT BLADE 4.0 EZ CLEAN MEGAD (MISCELLANEOUS) ×3
ELECT REM PT RETURN 9FT ADLT (ELECTROSURGICAL) ×3
ELECTRODE BLDE 4.0 EZ CLN MEGD (MISCELLANEOUS) ×1 IMPLANT
ELECTRODE REM PT RTRN 9FT ADLT (ELECTROSURGICAL) ×1 IMPLANT
FACESHIELD WRAPAROUND (MASK) ×6 IMPLANT
FACESHIELD WRAPAROUND OR TEAM (MASK) ×2 IMPLANT
GLOVE BIO SURGEON STRL SZ7.5 (GLOVE) ×3 IMPLANT
GLOVE BIO SURGEON STRL SZ8.5 (GLOVE) ×3 IMPLANT
GLOVE BIOGEL PI IND STRL 8 (GLOVE) ×1 IMPLANT
GLOVE BIOGEL PI IND STRL 9 (GLOVE) ×1 IMPLANT
GLOVE BIOGEL PI INDICATOR 8 (GLOVE) ×2
GLOVE BIOGEL PI INDICATOR 9 (GLOVE) ×2
GOWN STRL REUS W/ TWL LRG LVL3 (GOWN DISPOSABLE) ×1 IMPLANT
GOWN STRL REUS W/ TWL XL LVL3 (GOWN DISPOSABLE) ×2 IMPLANT
GOWN STRL REUS W/TWL LRG LVL3 (GOWN DISPOSABLE) ×3
GOWN STRL REUS W/TWL XL LVL3 (GOWN DISPOSABLE) ×6
KIT BASIN OR (CUSTOM PROCEDURE TRAY) ×3 IMPLANT
KIT ROOM TURNOVER OR (KITS) ×3 IMPLANT
MANIFOLD NEPTUNE II (INSTRUMENTS) ×3 IMPLANT
NDL 22X1.5 STRL (OR ONLY) (MISCELLANEOUS) ×2 IMPLANT
NEEDLE 22X1 1/2 OR ONLY (MISCELLANEOUS) ×4
NEEDLE 22X1.5 STRL (OR ONLY) (MISCELLANEOUS) ×2 IMPLANT
NS IRRIG 1000ML POUR BTL (IV SOLUTION) ×3 IMPLANT
PACK TOTAL JOINT (CUSTOM PROCEDURE TRAY) ×3 IMPLANT
PAD ARMBOARD 7.5X6 YLW CONV (MISCELLANEOUS) ×6 IMPLANT
SAW OSC TIP CART 19.5X105X1.3 (SAW) ×3 IMPLANT
SUT VIC AB 1 CTX 36 (SUTURE) ×3
SUT VIC AB 1 CTX36XBRD ANBCTR (SUTURE) ×1 IMPLANT
SUT VIC AB 2-0 CT1 27 (SUTURE) ×6
SUT VIC AB 2-0 CT1 TAPERPNT 27 (SUTURE) ×2 IMPLANT
SUT VIC AB 3-0 PS2 18 (SUTURE) ×3
SUT VIC AB 3-0 PS2 18XBRD (SUTURE) ×1 IMPLANT
SYR CONTROL 10ML LL (SYRINGE) ×6 IMPLANT
TOWEL OR 17X24 6PK STRL BLUE (TOWEL DISPOSABLE) ×3 IMPLANT
TOWEL OR 17X26 10 PK STRL BLUE (TOWEL DISPOSABLE) ×3 IMPLANT
TRAY CATH 16FR W/PLASTIC CATH (SET/KITS/TRAYS/PACK) IMPLANT
TRAY FOLEY CATH 14FR (SET/KITS/TRAYS/PACK) IMPLANT
WATER STERILE IRR 1000ML POUR (IV SOLUTION) ×1 IMPLANT

## 2016-02-29 NOTE — Interval H&P Note (Signed)
History and Physical Interval Note:  02/29/2016 11:19 AM  [name]  has presented today for surgery, with the diagnosis of LEFT HIP DEGENERATIVE JOINT DISEASE  The various methods of treatment have been discussed with the patient and family. After consideration of risks, benefits and other options for treatment, the patient has consented to  Procedure(s): TOTAL HIP ARTHROPLASTY ANTERIOR APPROACH (Left) as a surgical intervention .  The patient's history has been reviewed, patient examined, no change in status, stable for surgery.  I have reviewed the patient's chart and labs.  Questions were answered to the patient's satisfaction.     Kerin Salen

## 2016-02-29 NOTE — Evaluation (Signed)
Physical Therapy Evaluation Patient Details Name: [name] MRN: [id number] DOB: May 13, 1940 Today's Date: 02/29/2016   History of Present Illness  Pt is a 75 y/o female s/p L THA anterior approach. PMH including but not limited to hx of breast cancer with R mastectomy 12-13 years ago and hypertensive heart disease.  Clinical Impression  Pt presented supine in bed with HOB elevated, awake and willing to participate in therapy session. Pt was lethargic throughout session, but was able to participate fully. Prior to admission, pt reported that she was independent with all functional mobility. Pt's SPO2 decreased to 83% during bed mobility on RA; however, when instructed in deep breathing technique it increased back up to 97% quickly. Pt would continue to benefit from skilled physical therapy services at this time while admitted and after d/c to address her below listed limitations in order to improve her overall safety and independence with functional mobility.     Follow Up Recommendations Home health PT;Supervision/Assistance - 24 hour    Equipment Recommendations  None recommended by PT    Recommendations for Other Services       Precautions / Restrictions Precautions Precautions: Fall Restrictions Weight Bearing Restrictions: Yes LLE Weight Bearing: Weight bearing as tolerated      Mobility  Bed Mobility Overal bed mobility: Needs Assistance Bed Mobility: Supine to Sit     Supine to sit: Min guard;HOB elevated     General bed mobility comments: pt required increased time, use of bed rails and min guard for safety  Transfers Overall transfer level: Needs assistance Equipment used: Rolling walker (2 wheeled) Transfers: Sit to/from Stand Sit to Stand: Min guard         General transfer comment: pt required increased time, VC'ing for bilateral hand placement and min guard for safety  Ambulation/Gait Ambulation/Gait assistance: Min guard Ambulation Distance  (Feet): 5 Feet Assistive device: Rolling walker (2 wheeled) Gait Pattern/deviations: Step-to pattern;Decreased step length - right;Decreased stance time - left;Decreased weight shift to left Gait velocity: decreased Gait velocity interpretation: Below normal speed for age/gender General Gait Details: pt required VC'ing for sequencing with RW  Stairs            Wheelchair Mobility    Modified Rankin (Stroke Patients Only)       Balance Overall balance assessment: Needs assistance Sitting-balance support: Feet supported;No upper extremity supported Sitting balance-Leahy Scale: Fair     Standing balance support: During functional activity;Bilateral upper extremity supported Standing balance-Leahy Scale: Poor Standing balance comment: pt reliant on bilateral UEs on RW                             Pertinent Vitals/Pain Pain Assessment: 0-10 Pain Score: 2  Pain Location: L hip Pain Descriptors / Indicators: Sore Pain Intervention(s): Monitored during session;Repositioned;Ice applied    Home Living Family/patient expects to be discharged to:: Private residence Living Arrangements: Spouse/significant other Available Help at Discharge: Family;Available PRN/intermittently Type of Home: House Home Access: Stairs to enter Entrance Stairs-Rails: None Entrance Stairs-Number of Steps: 1 Home Layout: Two level;Able to live on main level with bedroom/bathroom;Full bath on main level Home Equipment: Walker - 2 wheels;Bedside commode      Prior Function Level of Independence: Independent               Hand Dominance        Extremity/Trunk Assessment   Upper Extremity Assessment: Defer to OT evaluation  Lower Extremity Assessment: LLE deficits/detail   LLE Deficits / Details: pt with decreased strength and ROM limitations secondary to post-op. Decreased sensation in L foot as compared to R.     Communication   Communication: No  difficulties  Cognition Arousal/Alertness: Lethargic;Suspect due to medications Behavior During Therapy: [facility] for tasks assessed/performed Overall Cognitive Status: Within Functional Limits for tasks assessed                      General Comments      Exercises     Assessment/Plan    PT Assessment Patient needs continued PT services  PT Problem List Decreased strength;Decreased range of motion;Decreased activity tolerance;Decreased balance;Decreased mobility;Decreased coordination;Decreased knowledge of use of DME;Pain          PT Treatment Interventions DME instruction;Stair training;Gait training;Functional mobility training;Therapeutic activities;Balance training;Therapeutic exercise;Neuromuscular re-education;Patient/family education    PT Goals (Current goals can be found in the Care Plan section)  Acute Rehab PT Goals Patient Stated Goal: return home PT Goal Formulation: With patient Time For Goal Achievement: 03/07/16 Potential to Achieve Goals: Good    Frequency 7X/week   Barriers to discharge        Co-evaluation               End of Session Equipment Utilized During Treatment: Gait belt Activity Tolerance: Patient limited by pain;Patient limited by lethargy;Patient limited by fatigue Patient left: in chair;with call bell/phone within reach;with chair alarm set Nurse Communication: Mobility status         Time: 1740-1800 PT Time Calculation (min) (ACUTE ONLY): 20 min   Charges:   PT Evaluation $PT Eval Moderate Complexity: 1 Procedure     PT G CodesClearnce Sorrel Apostolos Blagg 02/29/2016, 6:11 PM Sherie Don, [place], DPT [phone]

## 2016-02-29 NOTE — OR Nursing (Signed)
Pt c/o not being able to move left leg. Pt is moving left leg but it is a little slower and a little weaker on left than right. Dr. Mayer Camel informed of pt complaint. No new orders but he will be stopping by to assess pt.

## 2016-02-29 NOTE — Transfer of Care (Signed)
Immediate Anesthesia Transfer of Care Note  Patient: [name]  Procedure(s) Performed: Procedure(s): TOTAL HIP ARTHROPLASTY ANTERIOR APPROACH (Left)  Patient Location: PACU  Anesthesia Type:General  Level of Consciousness: awake, oriented and patient cooperative  Airway & Oxygen Therapy: Patient Spontanous Breathing and Patient connected to face mask oxygen  Post-op Assessment: Report given to RN and Post -op Vital signs reviewed and stable  Post vital signs: Reviewed  Last Vitals:  Vitals:   02/29/16 0814  BP: (!) 152/56  Pulse: 68  Resp: 18  Temp: 36.6 C    Last Pain:  Vitals:   02/29/16 0814  TempSrc: Oral      Patients Stated Pain Goal: 3 ([id number])  Complications: No apparent anesthesia complications

## 2016-02-29 NOTE — Discharge Instructions (Addendum)
INSTRUCTIONS AFTER JOINT REPLACEMENT  ° °o Remove items at home which could result in a fall. This includes throw rugs or furniture in walking pathways °o ICE to the affected joint every three hours while awake for 30 minutes at a time, for at least the first 3-5 days, and then as needed for pain and swelling.  Continue to use ice for pain and swelling. You may notice swelling that will progress down to the foot and ankle.  This is normal after surgery.  Elevate your leg when you are not up walking on it.   °o Continue to use the breathing machine you got in the hospital (incentive spirometer) which will help keep your temperature down.  It is common for your temperature to cycle up and down following surgery, especially at night when you are not up moving around and exerting yourself.  The breathing machine keeps your lungs expanded and your temperature down. ° ° °DIET:  As you were doing prior to hospitalization, we recommend a well-balanced diet. ° °DRESSING / WOUND CARE / SHOWERING ° °Keep the surgical dressing until follow up.  The dressing is water proof, so you can shower without any extra covering.  IF THE DRESSING FALLS OFF or the wound gets wet inside, change the dressing with sterile gauze.  Please use good hand washing techniques before changing the dressing.  Do not use any lotions or creams on the incision until instructed by your surgeon.   ° °ACTIVITY ° °o Increase activity slowly as tolerated, but follow the weight bearing instructions below.   °o No driving for 6 weeks or until further direction given by your physician.  You cannot drive while taking narcotics.  °o No lifting or carrying greater than 10 lbs. until further directed by your surgeon. °o Avoid periods of inactivity such as sitting longer than an hour when not asleep. This helps prevent blood clots.  °o You may return to work once you are authorized by your doctor.  ° ° ° °WEIGHT BEARING  ° °Weight bearing as tolerated with assist  device (walker, cane, etc) as directed, use it as long as suggested by your surgeon or therapist, typically at least 4-6 weeks. ° ° °EXERCISES ° °Results after joint replacement surgery are often greatly improved when you follow the exercise, range of motion and muscle strengthening exercises prescribed by your doctor. Safety measures are also important to protect the joint from further injury. Any time any of these exercises cause you to have increased pain or swelling, decrease what you are doing until you are comfortable again and then slowly increase them. If you have problems or questions, call your caregiver or physical therapist for advice.  ° °Rehabilitation is important following a joint replacement. After just a few days of immobilization, the muscles of the leg can become weakened and shrink (atrophy).  These exercises are designed to build up the tone and strength of the thigh and leg muscles and to improve motion. Often times heat used for twenty to thirty minutes before working out will loosen up your tissues and help with improving the range of motion but do not use heat for the first two weeks following surgery (sometimes heat can increase post-operative swelling).  ° °These exercises can be done on a training (exercise) mat, on the floor, on a table or on a bed. Use whatever works the best and is most comfortable for you.    Use music or television while you are exercising so that   the exercises are a pleasant break in your day. This will make your life better with the exercises acting as a break in your routine that you can look forward to.   Perform all exercises about fifteen times, three times per day or as directed.  You should exercise both the operative leg and the other leg as well. ° °Exercises include: °  °• Quad Sets - Tighten up the muscle on the front of the thigh (Quad) and hold for 5-10 seconds.   °• Straight Leg Raises - With your knee straight (if you were given a brace, keep it on),  lift the leg to 60 degrees, hold for 3 seconds, and slowly lower the leg.  Perform this exercise against resistance later as your leg gets stronger.  °• Leg Slides: Lying on your back, slowly slide your foot toward your buttocks, bending your knee up off the floor (only go as far as is comfortable). Then slowly slide your foot back down until your leg is flat on the floor again.  °• Angel Wings: Lying on your back spread your legs to the side as far apart as you can without causing discomfort.  °• Hamstring Strength:  Lying on your back, push your heel against the floor with your leg straight by tightening up the muscles of your buttocks.  Repeat, but this time bend your knee to a comfortable angle, and push your heel against the floor.  You may put a pillow under the heel to make it more comfortable if necessary.  ° °A rehabilitation program following joint replacement surgery can speed recovery and prevent re-injury in the future due to weakened muscles. Contact your doctor or a physical therapist for more information on knee rehabilitation.  ° ° °CONSTIPATION ° °Constipation is defined medically as fewer than three stools per week and severe constipation as less than one stool per week.  Even if you have a regular bowel pattern at home, your normal regimen is likely to be disrupted due to multiple reasons following surgery.  Combination of anesthesia, postoperative narcotics, change in appetite and fluid intake all can affect your bowels.  ° °YOU MUST use at least one of the following options; they are listed in order of increasing strength to get the job done.  They are all available over the counter, and you may need to use some, POSSIBLY even all of these options:   ° °Drink plenty of fluids (prune juice may be helpful) and high fiber foods °Colace 100 mg by mouth twice a day  °Senokot for constipation as directed and as needed Dulcolax (bisacodyl), take with full glass of water  °Miralax (polyethylene glycol)  once or twice a day as needed. ° °If you have tried all these things and are unable to have a bowel movement in the first 3-4 days after surgery call either your surgeon or your primary doctor.   ° °If you experience loose stools or diarrhea, hold the medications until you stool forms back up.  If your symptoms do not get better within 1 week or if they get worse, check with your doctor.  If you experience "the worst abdominal pain ever" or develop nausea or vomiting, please contact the office immediately for further recommendations for treatment. ° ° °ITCHING:  If you experience itching with your medications, try taking only a single pain pill, or even half a pain pill at a time.  You can also use Benadryl over the counter for itching or also to   help with sleep.  ° °TED HOSE STOCKINGS:  Use stockings on both legs until for at least 2 weeks or as directed by physician office. They may be removed at night for sleeping. ° °MEDICATIONS:  See your medication summary on the “After Visit Summary” that nursing will review with you.  You may have some home medications which will be placed on hold until you complete the course of blood thinner medication.  It is important for you to complete the blood thinner medication as prescribed. ° °PRECAUTIONS:  If you experience chest pain or shortness of breath - call 911 immediately for transfer to the hospital emergency department.  ° °If you develop a fever greater that 101 F, purulent drainage from wound, increased redness or drainage from wound, foul odor from the wound/dressing, or calf pain - CONTACT YOUR SURGEON.   °                                                °FOLLOW-UP APPOINTMENTS:  If you do not already have a post-op appointment, please call the office for an appointment to be seen by your surgeon.  Guidelines for how soon to be seen are listed in your “After Visit Summary”, but are typically between 1-4 weeks after surgery. ° °OTHER INSTRUCTIONS:  ° °Knee  Replacement:  Do not place pillow under knee, focus on keeping the knee straight while resting. CPM instructions: 0-90 degrees, 2 hours in the morning, 2 hours in the afternoon, and 2 hours in the evening. Place foam block, curve side up under heel at all times except when in CPM or when walking.  DO NOT modify, tear, cut, or change the foam block in any way. ° °MAKE SURE YOU:  °• Understand these instructions.  °• Get help right away if you are not doing well or get worse.  ° ° °Thank you for letting us be a part of your medical care team.  It is a privilege we respect greatly.  We hope these instructions will help you stay on track for a fast and full recovery!  ° ° ° ° °Information on my medicine - ELIQUIS® (apixaban) ° °This medication education was reviewed with me or my healthcare representative as part of my discharge preparation.  The pharmacist that spoke with me during my hospital stay was:  Chloe Flis Dien, RPH ° °Why was Eliquis® prescribed for you? °Eliquis® was prescribed for you to reduce the risk of blood clots forming after orthopedic surgery.   ° °What do You need to know about Eliquis®? °Take your Eliquis® TWICE DAILY - one tablet in the morning and one tablet in the evening with or without food.  It would be best to take the dose about the same time each day. ° °If you have difficulty swallowing the tablet whole please discuss with your pharmacist how to take the medication safely. ° °Take Eliquis® exactly as prescribed by your doctor and DO NOT stop taking Eliquis® without talking to the doctor who prescribed the medication.  Stopping without other medication to take the place of Eliquis® may increase your risk of developing a clot. ° °After discharge, you should have regular check-up appointments with your healthcare provider that is prescribing your Eliquis®. ° °What do you do if you miss a dose? °If a dose of ELIQUIS® is not taken at the   scheduled time, take it as soon as possible on the same  day and twice-daily administration should be resumed.  The dose should not be doubled to make up for a missed dose.  Do not take more than one tablet of ELIQUIS at the same time. ° °Important Safety Information °A possible side effect of Eliquis® is bleeding. You should call your healthcare provider right away if you experience any of the following: °? Bleeding from an injury or your nose that does not stop. °? Unusual colored urine (red or dark brown) or unusual colored stools (red or black). °? Unusual bruising for unknown reasons. °? A serious fall or if you hit your head (even if there is no bleeding). ° °Some medicines may interact with Eliquis® and might increase your risk of bleeding or clotting while on Eliquis®. To help avoid this, consult your healthcare provider or pharmacist prior to using any new prescription or non-prescription medications, including herbals, vitamins, non-steroidal anti-inflammatory drugs (NSAIDs) and supplements. ° °This website has more information on Eliquis® (apixaban): http://www.eliquis.com/eliquis/home ° °

## 2016-02-29 NOTE — Anesthesia Procedure Notes (Signed)
Procedure Name: Intubation Date/Time: 02/29/2016 11:30 AM Performed by: Jenne Campus Pre-anesthesia Checklist: Patient identified, Emergency Drugs available, Suction available, Patient being monitored and Timeout performed Patient Re-evaluated:Patient Re-evaluated prior to inductionOxygen Delivery Method: Circle system utilized Preoxygenation: Pre-oxygenation with 100% oxygen Intubation Type: IV induction Ventilation: Mask ventilation without difficulty Laryngoscope Size: Miller and 2 Grade View: Grade III Tube type: Oral Tube size: 7.0 mm Number of attempts: 1 Airway Equipment and Method: Stylet Placement Confirmation: ETT inserted through vocal cords under direct vision,  positive ETCO2 and breath sounds checked- equal and bilateral Secured at: 22 cm Tube secured with: Tape Dental Injury: Teeth and Oropharynx as per pre-operative assessment

## 2016-02-29 NOTE — Op Note (Signed)
OPERATIVE REPORT    DATE OF PROCEDURE:  02/29/2016       PREOPERATIVE DIAGNOSIS:  LEFT HIP DEGENERATIVE JOINT DISEASE                                                          POSTOPERATIVE DIAGNOSIS:  LEFT HIP DEGENERATIVE JOINT DISEASE                                                           PROCEDURE: Anterior L total hip arthroplasty using a 52 mm DePuy Pinnacle  Cup, [employer], 0-degree polyethylene liner, a +8 36 mm ceramic head, a 3 Depuy Triloc stem   SURGEON: Jamiah Recore J    ASSISTANT:   Eric K. [employer]  (present throughout entire procedure and necessary for timely completion of the procedure)   ANESTHESIA: GET BLOOD LOSS: 300 FLUID REPLACEMENT: 1600 crystalloid Antibiotic: 1gm vancomycin Tranexamic Acid: 1gm iv, 2gm topical COMPLICATIONS: none    INDICATIONS FOR PROCEDURE: A 75 y.o. year-old With  LEFT HIP DEGENERATIVE JOINT DISEASE   for 3 years, x-rays show bone-on-bone arthritic changes, and osteophytes. Despite conservative measures with observation, anti-inflammatory medicine, narcotics, use of a cane, has severe unremitting pain and can ambulate only a few blocks before resting. Patient desires elective L total hip arthroplasty to decrease pain and increase function. The risks, benefits, and alternatives were discussed at length including but not limited to the risks of infection, bleeding, nerve injury, stiffness, blood clots, the need for revision surgery, cardiopulmonary complications, among others, and they were willing to proceed. Questions answered     PROCEDURE IN DETAIL: The patient was identified by armband,  received preoperative IV antibiotics in the holding area at [facility], taken to the operating room , appropriate anesthetic monitors  were attached and  anesthesia was induced with the patienton the gurney. The HANA boots were applied to the feet and he was then transferred to the HANA table with a peroneal post and support  underneath the non-operative le, which was locked in 5 lb traction. Theoperative lower extremity was then prepped and draped in the usual sterile fashion from just above the iliac crest to the knee. And a timeout procedure was performed. We then made a 12 cm incision along the interval at the leading edge of the tensor fascia lata of starting at 2 cm lateral to and 2 cm distal to the ASIS. Small bleeders in the skin and subcutaneous tissue identified and cauterized we dissected down to the fascia and made an incision in the fascia allowing [place] to elevate the fascia of the tensor muscle and exploited the interval between the rectus and the tensor fascia lata. A Hohmann retractor was then placed along the superior neck of the femur and a Cobra retractor along the inferior neck of the femur we teed the capsule starting out at the superior anterior aspect of the acetabulum going distally and made the T along the neck both leaflets of the T were tagged with #2 Ethibond suture. Cobra retractors were then placed along the inferior and superior neck allowing [place] to perform  a standard neck cut and removed the femoral head with a power corkscrew. We then placed a right angle Hohmann retractor along the anterior aspect of the acetabulum a spiked Cobra in the cotyloid notch and posteriorly a Muelller retractor. We then sequentially reamed up to a 51 mm basket reamer obtaining good coverage in all quadrants, verified by C-arm imaging. Under C-arm control with and hammered into place a 52 mm Pinnacle cup in 45 of abduction and 15 of anteversion. The cup seated nicely and required no supplemental screws. We then placed a central hole Eliminator and a 0 polyethylene liner. The foot was then externally rotated to 110, the HANA elevator was placed around the flare of the greater trochanter and the limb was extended and abducted delivering the proximal femur up into the wound. A medium Hohmann retractor was placed over the greater  trochanter and a Mueller retractor along the posterior femoral neck completing the exposure. We then performed releases superiorly and and inferiorly of the capsule going back to the pirformis fossa superiorly and to the lesser trochanter inferiorly. We then entered the proximal femur with the box cutting offset chisel followed by, a canal sounder, the chili pepper and broaching up to a 3 broach. This seated nicely and we reamed the calcar. A trial reduction was performed with a 8 mm 36 mm head.The limb lengths were excellent the hip was stable in 90 of external rotation. At this point the trial components removed and we hammered into place a # 3 hi Tri-Lock stem with G[employer] coating. This was a Hi offset stem and a + 8 36 mm ceramic ball was then hammered into place the hip was reduced and final C-arm images obtained. The wound was thoroughly irrigated with normal saline solution. We repaired the ant capsule and the tensor fascia lot a with running 0 vicryl suture. the subcutaneous tissue was closed with 2-0 and 3-0 Vicryl suture followed by an Aquacil dressing. At this point the patient was awaken and transferred to hospital gurney without difficulty. The subcutaneous tissue with 0 and 2-0 undyed Vicryl suture and the skin with running  3-0 vicryl subcuticular suture. Aquacil dressing was applied. The patient was then unclamped, rolled supine, awaken extubated and taken to recovery room without difficulty in stable condition.   Frederik Pear J 02/29/2016, 12:58 PM

## 2016-02-29 NOTE — Anesthesia Preprocedure Evaluation (Addendum)
Anesthesia Evaluation  Patient identified by MRN, date of birth, ID band Patient awake    Reviewed: Allergy & Precautions, H&P , NPO status , Patient's Chart, lab work & pertinent test results  Airway Mallampati: II  TM Distance: >3 FB Neck ROM: Full    Dental  (+) Dental Advisory Given, Partial Upper   Pulmonary shortness of breath and with exertion, former smoker,    Pulmonary exam normal breath sounds clear to auscultation       Cardiovascular + CAD  Normal cardiovascular exam Rhythm:Regular Rate:Normal  Non obstructive 40% LCx lesion at Cardiac Cath 2008   Neuro/Psych Anxiety Hx/o panic attacksRestless legs syndrome    GI/Hepatic Neg liver ROS, GERD  Medicated and Controlled,  Endo/Other  Hx/o Right breast Ca S/P mastectomy  Renal/GU Renal diseasehematuria Bladder dysfunction  Hematuria    Musculoskeletal  (+) Arthritis , 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 Lab Results  Component Value Date   WBC 9.8 02/18/2016   HGB 11.9 (L) 02/18/2016   HCT 35.4 (L) 02/18/2016   MCV 91.5 02/18/2016   PLT 333 02/18/2016     Chemistry      Component Value Date/Time   NA 138 02/18/2016 0947   K 3.9 02/18/2016 0947   CL 106 02/18/2016 0947   CO2 23 02/18/2016 0947   BUN 23 (H) 02/18/2016 0947   CREATININE 1.07 (H) 02/18/2016 0947   CREATININE 0.94 08/03/2013 0833      Component Value Date/Time   CALCIUM 10.3 02/18/2016 0947   ALKPHOS 79 08/25/2014 1255   AST 29 08/25/2014 1255   ALT 22 08/25/2014 1255   BILITOT 0.3 08/25/2014 1255       Anesthesia Physical  Anesthesia Plan  ASA: II  Anesthesia Plan: General   Post-op Pain Management:    Induction:  Intravenous  Airway Management Planned: Oral ETT  Additional Equipment:   Intra-op Plan:   Post-operative Plan: Extubation in OR  Informed Consent: I have reviewed the patients History and Physical, chart, labs and discussed the procedure including the risks, benefits and alternatives for the proposed anesthesia with the patient or authorized representative who has indicated his/her understanding and acceptance.   Dental advisory given  Plan Discussed with: CRNA and Anesthesiologist  Anesthesia Plan Comments:        Anesthesia Quick Evaluation

## 2016-03-01 LAB — CBC
HCT: 30.2 % — ABNORMAL LOW (ref 36.0–46.0)
Hemoglobin: 10 g/dL — ABNORMAL LOW (ref 12.0–15.0)
MCH: 30.8 pg (ref 26.0–34.0)
MCHC: 33.1 g/dL (ref 30.0–36.0)
MCV: 92.9 fL (ref 78.0–100.0)
Platelets: 262 10*3/uL (ref 150–400)
RBC: 3.25 MIL/uL — ABNORMAL LOW (ref 3.87–5.11)
RDW: 12.9 % (ref 11.5–15.5)
WBC: 12.8 10*3/uL — ABNORMAL HIGH (ref 4.0–10.5)

## 2016-03-01 LAB — BASIC METABOLIC PANEL
Anion gap: 4 — ABNORMAL LOW (ref 5–15)
BUN: 17 mg/dL (ref 6–20)
CO2: 24 mmol/L (ref 22–32)
Calcium: 9 mg/dL (ref 8.9–10.3)
Chloride: 102 mmol/L (ref 101–111)
Creatinine, Ser: 0.99 mg/dL (ref 0.44–1.00)
GFR calc Af Amer: >60 mL/min (ref >60)
GFR calc non Af Amer: 54 mL/min — ABNORMAL LOW (ref >60)
Glucose, Bld: 136 mg/dL — ABNORMAL HIGH (ref 65–99)
Potassium: 4.3 mmol/L (ref 3.5–5.1)
Sodium: 130 mmol/L — ABNORMAL LOW (ref 135–145)

## 2016-03-01 NOTE — Progress Notes (Signed)
Physical Therapy Treatment Patient Details Name: [name] MRN: [id number] DOB: 12-31-1940 Today's Date: 03/01/2016    History of Present Illness Pt is a 75 y/o female s/p L THA anterior approach. PMH including but not limited to hx of breast cancer with R mastectomy 12-13 years ago and hypertensive heart disease.    PT Comments    Patient is making gradual progress toward mobility goals. Limited by pain this am. Continue to progress as tolerated.   Follow Up Recommendations  Home health PT;Supervision/Assistance - 24 hour     Equipment Recommendations  None recommended by PT    Recommendations for Other Services       Precautions / Restrictions Precautions Precautions: Fall Restrictions Weight Bearing Restrictions: Yes LLE Weight Bearing: Weight bearing as tolerated    Mobility  Bed Mobility Overal bed mobility: Needs Assistance Bed Mobility: Supine to Sit     Supine to sit: HOB elevated;Min assist     General bed mobility comments: assist to bring L LE to EOB and to scoot hips to EOB; HOB elevate and cues for technique; use of rails  Transfers Overall transfer level: Needs assistance Equipment used: Rolling walker (2 wheeled) Transfers: Sit to/from Stand Sit to Stand: Min guard         General transfer comment: cues for safe hand placement   Ambulation/Gait Ambulation/Gait assistance: Min guard Ambulation Distance (Feet):  (31ft and 14ft) Assistive device: Rolling walker (2 wheeled) Gait Pattern/deviations: Step-to pattern;Decreased stance time - left;Decreased step length - right;Decreased step length - left;Decreased weight shift to left;Antalgic Gait velocity: decreased   General Gait Details: cues for posture and sequencing; encouraged pt to increase WB on L LE; pt maintained TDWB first 12 ft to restroom and able to increase WB slightly 20ft to recliner; unable to tolerated further mobility   Stairs            Wheelchair Mobility     Modified Rankin (Stroke Patients Only)       Balance     Sitting balance-Leahy Scale: Fair       Standing balance-Leahy Scale: Poor                      Cognition Arousal/Alertness: Awake/alert (little drowsy) Behavior During Therapy: WFL for tasks assessed/performed Overall Cognitive Status: Within Functional Limits for tasks assessed                      Exercises      General Comments        Pertinent Vitals/Pain Pain Assessment: 0-10 Pain Score: 9  Pain Location: L hip/groin Pain Descriptors / Indicators: Aching;Guarding;Sore Pain Intervention(s): Limited activity within patient's tolerance;Monitored during session;Repositioned;Patient requesting pain meds-RN notified    Home Living                      Prior Function            PT Goals (current goals can now be found in the care plan section) Acute Rehab PT Goals Patient Stated Goal: return home PT Goal Formulation: With patient Time For Goal Achievement: 03/07/16 Potential to Achieve Goals: Good Progress towards PT goals: Progressing toward goals    Frequency    7X/week      PT Plan Current plan remains appropriate    Co-evaluation             End of Session Equipment Utilized During Treatment: Gait belt Activity Tolerance:  Patient limited by pain Patient left: in chair;with call bell/phone within reach;with chair alarm set;with family/visitor present     Time: [id number] PT Time Calculation (min) (ACUTE ONLY): 19 min  Charges:  $Gait Training: 8-22 mins                    G Codes:      Salina April, PTA Pager: [phone]   03/01/2016, 10:27 AM

## 2016-03-01 NOTE — Progress Notes (Signed)
Physical Therapy Treatment Patient Details Name: [name] MRN: [id number] DOB: 10/18/1940 Today's Date: 03/01/2016    History of Present Illness Pt is a 75 y/o female s/p L THA anterior approach. PMH including but not limited to hx of breast cancer with R mastectomy 12-13 years ago and hypertensive heart disease.    PT Comments    Patient is progressing well toward mobility goals. Tolerated increased gait distance, therex, and stair training with single step to simulate entering home. Continue to progress as tolerated with anticipated d/c home with HHPT.   Follow Up Recommendations  Home health PT;Supervision/Assistance - 24 hour     Equipment Recommendations  None recommended by PT    Recommendations for Other Services       Precautions / Restrictions Precautions Precautions: Fall Restrictions Weight Bearing Restrictions: Yes LLE Weight Bearing: Weight bearing as tolerated    Mobility  Bed Mobility Overal bed mobility: Needs Assistance Bed Mobility: Supine to Sit     Supine to sit: Min guard     General bed mobility comments: min guard for safety but no physical assist required this pm; cues for sequencing/technique and increased time needed; HOB flat and min use of rail  Transfers Overall transfer level: Needs assistance Equipment used: Rolling walker (2 wheeled) Transfers: Sit to/from Stand Sit to Stand: Min guard         General transfer comment: cues for safe hand placement   Ambulation/Gait Ambulation/Gait assistance: Supervision Ambulation Distance (Feet): 160 Feet Assistive device: Rolling walker (2 wheeled) Gait Pattern/deviations: Step-through pattern;Decreased stance time - left;Decreased step length - right;Decreased step length - left;Decreased weight shift to left Gait velocity: decreased   General Gait Details: slow, steady gait; cues for L heel strike and increased L knee/hip flexion to prevent circumduction; pt with improved ability  to WB on L LE with increased distance   Stairs Stairs: Yes Stairs assistance: Min guard Stair Management: No rails;Forwards;With walker Number of Stairs: 1 General stair comments: cues for sequencing/technique; single step with no rails to simulate entering home  Wheelchair Mobility    Modified Rankin (Stroke Patients Only)       Balance     Sitting balance-Leahy Scale: Fair       Standing balance-Leahy Scale: Poor                      Cognition Arousal/Alertness: Awake/alert Behavior During Therapy: WFL for tasks assessed/performed Overall Cognitive Status: Within Functional Limits for tasks assessed                      Exercises Total Joint Exercises Quad Sets: AROM;Both;10 reps;Supine Gluteal Sets: AROM;Both;10 reps;Supine Heel Slides: AROM;Left;10 reps;Supine Hip ABduction/ADduction: AROM;Left;10 reps;Supine Straight Leg Raises: AAROM;Left;5 reps;Supine    General Comments        Pertinent Vitals/Pain Pain Assessment: 0-10 Pain Score: 5  Pain Location: L hip Pain Descriptors / Indicators: Aching;Guarding;Sore Pain Intervention(s): Limited activity within patient's tolerance;Monitored during session;RN gave pain meds during session    Home Living                      Prior Function            PT Goals (current goals can now be found in the care plan section) Acute Rehab PT Goals Patient Stated Goal: return home PT Goal Formulation: With patient Time For Goal Achievement: 03/07/16 Potential to Achieve Goals: Good Progress towards PT goals: Progressing  toward goals    Frequency    7X/week      PT Plan Current plan remains appropriate    Co-evaluation             End of Session Equipment Utilized During Treatment: Gait belt Activity Tolerance: Patient tolerated treatment well Patient left: in chair;with call bell/phone within reach     Time: 1341-1422 PT Time Calculation (min) (ACUTE ONLY): 41  min  Charges:  $Gait Training: 8-22 mins $Therapeutic Exercise: 8-22 mins $Therapeutic Activity: 8-22 mins                    G Codes:      Salina April, PTA Pager: [phone]   03/01/2016, 2:42 PM

## 2016-03-01 NOTE — Progress Notes (Signed)
Subjective: 1 Day Post-Op Procedure(s) (LRB): TOTAL HIP ARTHROPLASTY ANTERIOR APPROACH (Left)  Activity level:  wbat Diet tolerance:  ok Voiding:  ok Patient reports pain as mild and moderate.    Objective: Vital signs in last 24 hours: Temp:  [97.4 F (36.3 C)-98.1 F (36.7 C)] 98.1 F (36.7 C) (11/04 0500) Pulse Rate:  [60-71] 70 (11/04 0500) Resp:  [16-18] 16 (11/04 0500) BP: (109-161)/(36-84) 109/84 (11/04 0500) SpO2:  [97 %-100 %] 100 % (11/04 0500) Weight:  [72.7 kg (160 lb 4.8 oz)] 72.7 kg (160 lb 4.8 oz) (11/03 0814)  Labs:  Recent Labs  03/01/16 0431  HGB 10.0*    Recent Labs  03/01/16 0431  WBC 12.8*  RBC 3.25*  HCT 30.2*  PLT 262    Recent Labs  03/01/16 0431  NA 130*  K 4.3  CL 102  CO2 24  BUN 17  CREATININE 0.99  GLUCOSE 136*  CALCIUM 9.0   No results for input(s): LABPT, INR in the last 72 hours.  Physical Exam:  Neurologically intact ABD soft Neurovascular intact Sensation intact distally Intact pulses distally Dorsiflexion/Plantar flexion intact Incision: dressing C/D/I and no drainage No cellulitis present Compartment soft  Assessment/Plan:  1 Day Post-Op Procedure(s) (LRB): TOTAL HIP ARTHROPLASTY ANTERIOR APPROACH (Left) Advance diet Up with therapy D/C IV fluids  Continue eliquis for DVT prevention. Continue current pain meds.  Follow up in office 2 weeks post op with Dr. Mayer Camel.  [name], Larwance Sachs 03/01/2016, 7:25 AM

## 2016-03-01 NOTE — Care Management Note (Signed)
Case Management Note  Patient Details  Name: [name] MRN: [id number] Date of Birth: 1941-03-14  Subjective/Objective:  75 yr old female s/p left total hip arthroplasty.                  Action/Plan: Case manager spoke with patient and husband concerning [place] and DME needs. Patient was preoperatively setup with [place], no changes. Patient states she has rolling walker, 3in1, cane and crutches. Patient states she will need a hospital bed because her bed is on 2nd level of home and is very high. Case manager explained she may be required to pay for rental, patient is agreeable, CM entered order for MD signature. She will have family support at discharge.  Expected Discharge Date:    03/02/16              Expected Discharge Plan:  [place]  In-House Referral:  NA  Discharge planning Services  CM Consult  Post Acute Care Choice:  Durable Medical Equipment, Home Health Choice offered to:  Patient  DME Arranged:  Hospital bed DME Agency:     HH Arranged:  PT [place]:  [place]  Status of Service:  Completed, signed off  If discussed at [place] of Stay Meetings, dates discussed:    Additional Comments:  Ninfa Meeker, RN 03/01/2016, 10:57 AM

## 2016-03-01 NOTE — Evaluation (Signed)
Occupational Therapy Evaluation and Discharge Patient Details Name: LINDER PRAJAPATI MRN: [id number] DOB: May 28, 1940 Today's Date: 03/01/2016    History of Present Illness Pt is a 75 y/o female s/p L THA anterior approach. PMH including but not limited to hx of breast cancer with R mastectomy 12-13 years ago and hypertensive heart disease.   Clinical Impression   PTA Pt independent in ADL and mobility. Pt currently min A for LB ADL and supervision for mobility with RW. Pt has DME from previous back surgery including AE kit. Pt at adequate level to d/c home with family support. Pt had no further questions or concerns from OT at this time. No further OT concerns, thank you for this referral.     Follow Up Recommendations  No OT follow up;Supervision - Intermittent    Equipment Recommendations  None recommended by OT (Pt has necessary DME)    Recommendations for Other Services       Precautions / Restrictions Precautions Precautions: Fall Restrictions Weight Bearing Restrictions: Yes LLE Weight Bearing: Weight bearing as tolerated      Mobility Bed Mobility Overal bed mobility: Needs Assistance Bed Mobility: Supine to Sit;Sit to Supine     Supine to sit: Min assist;HOB elevated Sit to supine: Min guard;HOB elevated   General bed mobility comments: min A for support of LLE over edge of bed, no physical assist needed to get back in bed  Transfers Overall transfer level: Needs assistance Equipment used: Rolling walker (2 wheeled) Transfers: Sit to/from Stand Sit to Stand: Min guard         General transfer comment: cues for safe hand placement     Balance Overall balance assessment: Needs assistance Sitting-balance support: No upper extremity supported;Feet supported Sitting balance-Leahy Scale: Good Sitting balance - Comments: sitting EOB for LB ADL   Standing balance support: Bilateral upper extremity supported;During functional activity Standing balance-Leahy  Scale: Poor                              ADL Overall ADL's : Needs assistance/impaired Eating/Feeding: Modified independent;Sitting   Grooming: Wash/dry hands;Wash/dry face;Standing Grooming Details (indicate cue type and reason): sink level             Lower Body Dressing: Minimal assistance;Sit to/from stand Lower Body Dressing Details (indicate cue type and reason): able to don/doff right sock and min A for L sock Toilet Transfer: Min guard;Ambulation;BSC;RW;Comfort height toilet Toilet Transfer Details (indicate cue type and reason): BSC over toilet Toileting- Clothing Manipulation and Hygiene: Supervision/safety   Tub/ Shower Transfer: Walk-in shower;Min guard;Cueing for sequencing;Ambulation;3 in 1;Rolling walker Tub/Shower Transfer Details (indicate cue type and reason): educated on 3 in 1 as shower chair Functional mobility during ADLs: Supervision/safety       Vision     Perception     Praxis      Pertinent Vitals/Pain Pain Assessment: 0-10 Pain Score: 1  Pain Location: L hip Pain Descriptors / Indicators: Aching;Sore Pain Intervention(s): Limited activity within patient's tolerance;Monitored during session;Premedicated before session     Hand Dominance Right   Extremity/Trunk Assessment Upper Extremity Assessment Upper Extremity Assessment: Overall WFL for tasks assessed   Lower Extremity Assessment Lower Extremity Assessment: LLE deficits/detail LLE Deficits / Details: pt with decreased strength and ROM limitations secondary to post-op   Cervical / Trunk Assessment Cervical / Trunk Assessment: Other exceptions Cervical / Trunk Exceptions: History of back surgeries   Communication Communication Communication: No  difficulties   Cognition Arousal/Alertness: Awake/alert Behavior During Therapy: WFL for tasks assessed/performed Overall Cognitive Status: Within Functional Limits for tasks assessed                     General  Comments       Exercises      Shoulder Instructions      Home Living Family/patient expects to be discharged to:: Private residence Living Arrangements: Spouse/significant other Available Help at Discharge: Family;Available 24 hours/day Type of Home: House Home Access: Stairs to enter [employer] of Steps: 1 Entrance Stairs-Rails: None Home Layout: Two level;Able to live on main level with bedroom/bathroom;Full bath on main level Alternate Level Stairs-Number of Steps: flight   Bathroom Shower/Tub: Occupational psychologist: Handicapped height Bathroom Accessibility: Yes How Accessible: Accessible via walker Home Equipment: [place] - 2 wheels;Bedside commode (grabber)          Prior Functioning/Environment Level of Independence: Independent                 OT Problem List: Decreased strength;Decreased range of motion;Decreased activity tolerance;Impaired balance (sitting and/or standing);Pain   OT Treatment/Interventions:      OT Goals(Current goals can be found in the care plan section) Acute Rehab OT Goals Patient Stated Goal: return home OT Goal Formulation: With patient Time For Goal Achievement: 03/08/16 Potential to Achieve Goals: Good  OT Frequency:     Barriers to D/C:            Co-evaluation              End of Session Equipment Utilized During Treatment: Rolling walker Nurse Communication: Mobility status  Activity Tolerance: Patient tolerated treatment well Patient left: in bed;with call bell/phone within reach   Time: 1540-1558 OT Time Calculation (min): 18 min Charges:  OT General Charges $OT Visit: 1 Procedure OT Evaluation $OT Eval Low Complexity: 1 Procedure G-Codes:    Merri Ray Gabryel Talamo 03-21-2016, 4:05 PM  Hulda Humphrey OTR/L [phone]

## 2016-03-02 LAB — CBC
HCT: 29.1 % — ABNORMAL LOW (ref 36.0–46.0)
Hemoglobin: 9.6 g/dL — ABNORMAL LOW (ref 12.0–15.0)
MCH: 30.9 pg (ref 26.0–34.0)
MCHC: 33 g/dL (ref 30.0–36.0)
MCV: 93.6 fL (ref 78.0–100.0)
Platelets: 240 10*3/uL (ref 150–400)
RBC: 3.11 MIL/uL — ABNORMAL LOW (ref 3.87–5.11)
RDW: 13.1 % (ref 11.5–15.5)
WBC: 15.7 10*3/uL — ABNORMAL HIGH (ref 4.0–10.5)

## 2016-03-02 NOTE — Progress Notes (Signed)
Subjective: 2 Days Post-Op Procedure(s) (LRB): TOTAL HIP ARTHROPLASTY ANTERIOR APPROACH (Left)   Patient is in a little more pain today. She is walking and moving well with PT though. She states she has a hospital bed being delivered tomorrow and wants to wait to go home until she has that.    Activity level:  wbat Diet tolerance:  ok Voiding:  ok Patient reports pain as mild and moderate.    Objective: Vital signs in last 24 hours: Temp:  [98.2 F (36.8 C)-98.3 F (36.8 C)] 98.3 F (36.8 C) (11/05 0527) Pulse Rate:  [68-133] 124 (11/05 0544) Resp:  [16-18] 18 (11/05 0527) BP: (112-172)/(51-72) 151/72 (11/05 0527) SpO2:  [96 %-100 %] 96 % (11/05 0527)  Labs:  Recent Labs  03/01/16 0431 03/02/16 0623  HGB 10.0* 9.6*    Recent Labs  03/01/16 0431 03/02/16 0623  WBC 12.8* 15.7*  RBC 3.25* 3.11*  HCT 30.2* 29.1*  PLT 262 240    Recent Labs  03/01/16 0431  NA 130*  K 4.3  CL 102  CO2 24  BUN 17  CREATININE 0.99  GLUCOSE 136*  CALCIUM 9.0   No results for input(s): LABPT, INR in the last 72 hours.  Physical Exam:  Neurologically intact ABD soft Neurovascular intact Sensation intact distally Intact pulses distally Dorsiflexion/Plantar flexion intact Incision: dressing C/D/I and no drainage No cellulitis present Compartment soft  Assessment/Plan:  2 Days Post-Op Procedure(s) (LRB): TOTAL HIP ARTHROPLASTY ANTERIOR APPROACH (Left) Advance diet Up with therapy D/C IV fluids Plan for discharge tomorrow Discharge home with home health Continue Eliquis for DVT prevention. Continue current pain meds. Follow up in office 2 weeks post op with Dr. Mayer Camel.   [name], [name] 03/02/2016, 9:41 AM

## 2016-03-02 NOTE — Progress Notes (Signed)
Physical Therapy Treatment Patient Details Name: TIMMY DELAHOUSSAYE MRN: [id number] DOB: [birth year] Today's Date: 03/02/2016    History of Present Illness Pt is a 75 y/o female s/p L THA anterior approach. PMH including but not limited to hx of breast cancer with R mastectomy 12-13 years ago and hypertensive heart disease.    PT Comments    Pt presented awake, alert, and supine in bed; participated well in all exercises. Pt moved overall with supervision/min guard and occasional cues for hand placement and safety. Pt is progressing well towards goals. Pt is on track for d/c home tomorrow.   Follow Up Recommendations  Home health PT;Supervision/Assistance - 24 hour (Gait training)     Equipment Recommendations       Recommendations for Other Services       Precautions / Restrictions Precautions Precautions: Fall Restrictions Weight Bearing Restrictions: Yes LLE Weight Bearing: Weight bearing as tolerated    Mobility  Bed Mobility Overal bed mobility: Supervision Bed Mobility: Supine to Sit     Supine to sit: Supervision     General bed mobility comments: Patient was able to bridge hips with cueing to maneuver in bed. Pt did not need assistance in bed but took increased time. Patient used rails of bed and will have hospital bed at home for 2 weeks.  Transfers Overall transfer level: Needs Assistance Equipment used: Rolling walker (2 wheeled) Transfers: Sit to/from Stand Sit to Stand: Min guard         General transfer comment: Cues for safe hand placement in the bathroom. Pt is dependent on UE for rising from low commode, attempted to use RW instead of safety bars.  Ambulation/Gait Ambulation/Gait assistance: Supervision Ambulation Distance (Feet): 120 Feet Assistive device: Rolling walker (2 wheeled) Gait Pattern/deviations: Step-through pattern;Decreased step length - right;Decreased step length - left;Decreased weight shift to left Gait velocity: decreased    General Gait Details: Patient needed cues for placing shoulders over hips and standing tall. Patient pace was slow   Stairs Stairs: Yes Stairs assistance: Supervision Stair Management: With walker Number of Stairs: 1 General stair comments: Patient remembered steps for navigating stairs with walker.  Wheelchair Mobility    Modified Rankin (Stroke Patients Only)       Balance Overall balance assessment: Needs Assistance Sitting-balance support: Feet supported;No upper extremity supported Sitting balance-Leahy Scale: Good     Standing balance support: Bilateral upper extremity supported;During functional activity Standing balance-Leahy Scale: Fair                      Cognition Arousal/Alertness: Awake/alert Behavior During Therapy: WFL for tasks assessed/performed Overall Cognitive Status: Within Functional Limits for tasks assessed                      Exercises Total Joint Exercises Quad Sets: AROM;Both;10 reps;Supine Gluteal Sets: AROM;Both;10 reps;Supine Heel Slides: AROM;Left;10 reps;Supine Hip ABduction/ADduction: AROM;Left;10 reps;Supine Bridges: 10 reps;AROM;Supine;Both    General Comments        Pertinent Vitals/Pain Pain Assessment: 0-10 Pain Score: 5  Pain Location: L Hip/Groin; no pain initially, 5/10 at end of session Pain Descriptors / Indicators: Aching Pain Intervention(s): Limited activity within patient's tolerance;Monitored during session;Premedicated before session    Home Living                      Prior Function            PT Goals (current goals can now be found  in the care plan section) Acute Rehab PT Goals Patient Stated Goal: Return Home and independent ADL's PT Goal Formulation: With patient Time For Goal Achievement: 03/07/16 Potential to Achieve Goals: Good Progress towards PT goals: Progressing toward goals    Frequency    7X/week      PT Plan Current plan remains appropriate     Co-evaluation             End of Session Equipment Utilized During Treatment: Gait belt Activity Tolerance: Patient tolerated treatment well Patient left: in chair;with call bell/phone within reach     Time: 1017-1050 PT Time Calculation (min) (ACUTE ONLY): 33 min  Charges:  $Gait Training: 8-22 mins $Therapeutic Exercise: 8-22 mins                    G Codes:      Roderic Palau 03-07-16, 2:56 PM   Shella Maxim, SPT Acute Rehabilitation Services Office [phone]

## 2016-03-03 ENCOUNTER — Encounter (HOSPITAL_COMMUNITY): Payer: Self-pay | Admitting: Orthopedic Surgery

## 2016-03-03 LAB — CBC
HCT: 25.3 % — ABNORMAL LOW (ref 36.0–46.0)
Hemoglobin: 8.4 g/dL — ABNORMAL LOW (ref 12.0–15.0)
MCH: 30.7 pg (ref 26.0–34.0)
MCHC: 33.2 g/dL (ref 30.0–36.0)
MCV: 92.3 fL (ref 78.0–100.0)
Platelets: 220 10*3/uL (ref 150–400)
RBC: 2.74 MIL/uL — ABNORMAL LOW (ref 3.87–5.11)
RDW: 12.9 % (ref 11.5–15.5)
WBC: 13.7 10*3/uL — ABNORMAL HIGH (ref 4.0–10.5)

## 2016-03-03 NOTE — Progress Notes (Signed)
PATIENT ID: FATUMA CANIZALES  MRN: [id number]  DOB/AGE:  1940/05/18 / 75 y.o.  3 Days Post-Op Procedure(s) (LRB): TOTAL HIP ARTHROPLASTY ANTERIOR APPROACH (Left)    PROGRESS NOTE Subjective: Patient is alert, oriented, no Nausea, no Vomiting, yes passing gas, . Taking PO well. Denies SOB, Chest or Calf Pain. Using Incentive Spirometer, PAS in place. Ambulate WBAT with pt walking 120 ft witht herapy Patient reports pain as  1/10  .    Objective: Vital signs in last 24 hours: Vitals:   03/02/16 0544 03/02/16 1412 03/02/16 2151 03/03/16 0544  BP:  (!) 148/64 (!) 115/44 (!) 112/51  Pulse: (!) 124 88 84 80  Resp:  18 16 16   Temp:  98.2 F (36.8 C) 98.7 F (37.1 C) 98.1 F (36.7 C)  TempSrc:  Oral Oral Oral  SpO2:  97% 95% 93%  Weight:      Height:          Intake/Output from previous day: I/O last 3 completed shifts: In: 200 [P.O.:200] Out: -    Intake/Output this shift: No intake/output data recorded.   LABORATORY DATA:  Recent Labs  03/01/16 0431 03/02/16 0623 03/03/16 0405  WBC 12.8* 15.7* 13.7*  HGB 10.0* 9.6* 8.4*  HCT 30.2* 29.1* 25.3*  PLT 262 240 220  NA 130*  --   --   K 4.3  --   --   CL 102  --   --   CO2 24  --   --   BUN 17  --   --   CREATININE 0.99  --   --   GLUCOSE 136*  --   --   CALCIUM 9.0  --   --     Examination: Neurologically intact Neurovascular intact Sensation intact distally Intact pulses distally Dorsiflexion/Plantar flexion intact Incision: dressing C/D/I No cellulitis present Compartment soft} XR AP&Lat of hip shows well placed\fixed THA  Assessment:   3 Days Post-Op Procedure(s) (LRB): TOTAL HIP ARTHROPLASTY ANTERIOR APPROACH (Left) ADDITIONAL DIAGNOSIS:  Expected Acute Blood Loss Anemia, Hypertension and Hx breast cancer  Plan: PT/OT WBAT, THA  DVT Prophylaxis: SCDx72 hrs, Eliquis 2.5 mg BID x 2 weeks  DISCHARGE PLAN: Home  DISCHARGE NEEDS: HHPT, Walker and 3-in-1 comode seat

## 2016-03-03 NOTE — Progress Notes (Signed)
Patient discharged to home, discharge instructions given, patient stated she understood 

## 2016-03-03 NOTE — Progress Notes (Signed)
Physical Therapy Treatment Patient Details Name: [name] MRN: [id number] DOB: Feb 26, 1941 Today's Date: 03/03/2016    History of Present Illness Pt is a 75 y/o female s/p L THA anterior approach. PMH including but not limited to hx of breast cancer with R mastectomy 12-13 years ago and hypertensive heart disease.    PT Comments    Continuing work on progressive amb, gait smoothness, and stability in L stance; Overall at supervision and occasionally needing minguard assist; Will have plenty of help at home, and has all equipment; OK for dc home from PT standpoint   Follow Up Recommendations  Home health PT;Supervision/Assistance - 24 hour     Equipment Recommendations  None recommended by PT    Recommendations for Other Services       Precautions / Restrictions Precautions Precautions: Fall Precaution Comments: Fall risk greatly reduced with RW Restrictions Weight Bearing Restrictions: Yes LLE Weight Bearing: Weight bearing as tolerated    Mobility  Bed Mobility               General bed mobility comments: Sittign EOB upon my arrival  Transfers Overall transfer level: Needs assistance Equipment used: Rolling walker (2 wheeled) Transfers: Sit to/from Stand Sit to Stand: Supervision         General transfer comment: Noting better hand placement with rise today; Reinforced safest hand placement for transfers  Ambulation/Gait Ambulation/Gait assistance: Supervision Ambulation Distance (Feet): 160 Feet Assistive device: Rolling walker (2 wheeled) Gait Pattern/deviations: Trunk flexed;Decreased stance time - left (hops slightly flesed throughout gait cycle bilaterally) Gait velocity: decreased   General Gait Details: Cues for upright posture, and to stand tall on LLE in stance including paying attention to activating gluteals for better hip extension in stance   Stairs         General stair comments: REviewed sequence for ascending and descending  steps  Wheelchair Mobility    Modified Rankin (Stroke Patients Only)       Balance     Sitting balance-Leahy Scale: Good       Standing balance-Leahy Scale: Fair                      Cognition Arousal/Alertness: Awake/alert Behavior During Therapy: WFL for tasks assessed/performed Overall Cognitive Status: Within Functional Limits for tasks assessed                      Exercises Total Joint Exercises Gluteal Sets: AROM;Both;10 reps;Supine Towel Squeeze: AROM;Both;10 reps Heel Slides: AAROM;Left;10 reps Hip ABduction/ADduction: AAROM;Left;10 reps    General Comments        Pertinent Vitals/Pain Pain Assessment: 0-10 Pain Score: 5  Pain Location: L hip Pain Descriptors / Indicators: Aching;Throbbing Pain Intervention(s): Premedicated before session;Monitored during session;Repositioned    Home Living                      Prior Function            PT Goals (current goals can now be found in the care plan section) Acute Rehab PT Goals Patient Stated Goal: Return Home and independent ADL's PT Goal Formulation: With patient Time For Goal Achievement: 03/07/16 Potential to Achieve Goals: Good Progress towards PT goals: Progressing toward goals    Frequency    7X/week      PT Plan Current plan remains appropriate    Co-evaluation             End of Session  Equipment Utilized During Treatment: Gait belt Activity Tolerance: Patient tolerated treatment well Patient left: in chair;with call bell/phone within reach     Time: 0856-0915 PT Time Calculation (min) (ACUTE ONLY): 19 min  Charges:  $Gait Training: 8-22 mins                    G Codes:      Quin Hoop 03/03/2016, 9:29 AM   Roney Marion, PT  Acute Rehabilitation Services Pager [phone] Office [phone]

## 2016-03-03 NOTE — Discharge Summary (Signed)
Patient ID: AARIAN BURFEIND MRN: [id number] DOB/AGE: [birth year] 75 y.o.  Admit date: 02/29/2016 Discharge date: 03/03/2016  Admission Diagnoses:  Principal Problem:   Primary osteoarthritis of left hip Active Problems:   Primary localized osteoarthritis of left hip   Discharge Diagnoses:  Same  Past Medical History:  Diagnosis Date  . Anemia    "little bit"  . Arthritis    back  . Breast cancer ([place])    Right mastectomy  . Bruises easily   . Chronic back pain   . Coronary atherosclerosis of native coronary artery    Nonobstructive at catheterization 2008 - 40% circumflex  . Cystocele 02/20/2015  . Diverticula of colon   . Environmental allergies   . Fitting and adjustment of pessary 05/29/2015   Inserted milex ring with support #2   . GERD (gastroesophageal reflux disease)    doesn't take any meds from this.  Takes tums as neeeded.  . Hematuria 02/20/2015  . History of bronchitis    >33yrs ago  . History of colon polyps   . History of diverticulosis 05/04/2015  . History of migraine    last time a few months ago  . Insomnia    doesn't take any meds  . Kidney infection    history of  . Nocturia   . Panic attacks    no meds required  . Pelvic pain in female 02/20/2015  . 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 Rectal pain, chronic 05/18/2015  . Seasonal allergies   . Shortness of breath    with exertion  . Urinary frequency   . Urinary urgency     Surgeries: Procedure(s): TOTAL HIP ARTHROPLASTY ANTERIOR APPROACH on 02/29/2016   Consultants:   Discharged Condition: Improved  Hospital Course: ZARIAN FACEY is an 75 y.o. female who was admitted 02/29/2016 for operative treatment ofPrimary osteoarthritis of left hip. Patient has severe unremitting pain that affects sleep, daily activities, and work/hobbies. After pre-op clearance the patient was taken to the operating room on 02/29/2016 and underwent  Procedure(s): TOTAL HIP ARTHROPLASTY  ANTERIOR APPROACH.    Patient was given perioperative antibiotics: Anti-infectives    Start     Dose/Rate Route Frequency Ordered Stop   02/29/16 0800  vancomycin (VANCOCIN) IVPB 1000 mg/200 mL premix     1,000 mg 200 mL/hr over 60 Minutes Intravenous On call to O.R. 02/29/16 0800 02/29/16 1145       Patient was given sequential compression devices, early ambulation, and chemoprophylaxis to prevent DVT.  Patient benefited maximally from hospital stay and there were no complications.    Recent vital signs: Patient Vitals for the past 24 hrs:  BP Temp Temp src Pulse Resp SpO2  03/03/16 0544 (!) 112/51 98.1 F (36.7 C) Oral 80 16 93 %  03/02/16 2151 (!) 115/44 98.7 F (37.1 C) Oral 84 16 95 %  03/02/16 1412 (!) 148/64 98.2 F (36.8 C) Oral 88 18 97 %     Recent laboratory studies:  Recent Labs  03/01/16 0431 03/02/16 0623 03/03/16 0405  WBC 12.8* 15.7* 13.7*  HGB 10.0* 9.6* 8.4*  HCT 30.2* 29.1* 25.3*  PLT 262 240 220  NA 130*  --   --   K 4.3  --   --   CL 102  --   --   CO2 24  --   --   BUN 17  --   --   CREATININE 0.99  --   --   GLUCOSE  136*  --   --   CALCIUM 9.0  --   --      Discharge Medications:     Medication List    STOP taking these medications   HYDROcodone-acetaminophen 10-325 MG tablet Commonly known as:  NORCO   traMADol 50 MG tablet Commonly known as:  ULTRAM     TAKE these medications   amLODipine 5 MG tablet Commonly known as:  NORVASC Take 5 mg by mouth daily.   apixaban 2.5 MG Tabs tablet Commonly known as:  ELIQUIS Take 1 tablet (2.5 mg total) by mouth 2 (two) times daily.   oxyCODONE-acetaminophen 5-325 MG tablet Commonly known as:  ROXICET Take 1 tablet by mouth every 4 (four) hours as needed.   polyethylene glycol powder powder Commonly known as:  GLYCOLAX/MIRALAX Take 17 g daily in fluid What changed:  Another medication with the same name was removed. Continue taking this medication, and follow the directions you see  here.   tiZANidine 2 MG tablet Commonly known as:  ZANAFLEX Take 1 tablet (2 mg total) by mouth every 6 (six) hours as needed for muscle spasms.       Diagnostic Studies: Dg Chest 2 View  Result Date: 02/18/2016 CLINICAL DATA:  Preop left total hip arthroplasty EXAM: CHEST  2 VIEW COMPARISON:  12/20/2012 FINDINGS: There is hyperinflation of the lungs compatible with COPD. Heart and mediastinal contours are within normal limits. No focal opacities or effusions. No acute bony abnormality. Surgical staples in the right breast and right axilla. IMPRESSION: COPD.  No active disease. Electronically Signed   By: Rolm Baptise M.D.   On: 02/18/2016 10:27   Dg C-arm 1-60 Min  Result Date: 02/29/2016 CLINICAL DATA:  LEFT hip arthroplasty EXAM: OPERATIVE LEFT HIP (WITH PELVIS IF PERFORMED) 2 VIEWS TECHNIQUE: Fluoroscopic spot image(s) were submitted for interpretation post-operatively. COMPARISON:  LEFT hip injection image of 10/25/2015 FLUOROSCOPY TIME:  0 minutes 16 seconds Images obtained:  2 Absorbed dose: Not provided FINDINGS: Components of LEFT hip prosthesis identified in expected position. No acute fracture, dislocation, or bone destruction identified. IMPRESSION: LEFT hip prosthesis without acute complication. Electronically Signed   By: Lavonia Dana M.D.   On: 02/29/2016 13:08   Dg Hip Operative Unilat With Pelvis Left  Result Date: 02/29/2016 CLINICAL DATA:  LEFT hip arthroplasty EXAM: OPERATIVE LEFT HIP (WITH PELVIS IF PERFORMED) 2 VIEWS TECHNIQUE: Fluoroscopic spot image(s) were submitted for interpretation post-operatively. COMPARISON:  LEFT hip injection image of 10/25/2015 FLUOROSCOPY TIME:  0 minutes 16 seconds Images obtained:  2 Absorbed dose: Not provided FINDINGS: Components of LEFT hip prosthesis identified in expected position. No acute fracture, dislocation, or bone destruction identified. IMPRESSION: LEFT hip prosthesis without acute complication. Electronically Signed   By: Lavonia Dana M.D.   On: 02/29/2016 13:08    Disposition: 01-Home or Self Care  Discharge Instructions    Call MD / Call 911    Complete by:  As directed    If you experience chest pain or shortness of breath, CALL 911 and be transported to the hospital emergency room.  If you develope a fever above 101 F, pus (white drainage) or increased drainage or redness at the wound, or calf pain, call your surgeon's office.   Constipation Prevention    Complete by:  As directed    Drink plenty of fluids.  Prune juice may be helpful.  You may use a stool softener, such as Colace (over the counter) 100 mg twice a day.  Use MiraLax (over the counter) for constipation as needed.   Diet - low sodium heart healthy    Complete by:  As directed    Driving restrictions    Complete by:  As directed    No driving for 2 weeks   Follow the hip precautions as taught in Physical Therapy    Complete by:  As directed    Increase activity slowly as tolerated    Complete by:  As directed    Patient may shower    Complete by:  As directed    You may shower without a dressing once there is no drainage.  Do not wash over the wound.  If drainage remains, cover wound with plastic wrap and then shower.      Follow-up Information    Kerin Salen, MD Follow up in 2 week(s).   Specialty:  Orthopedic Surgery Contact information: [place] [address] [place] [facility].   Why:  Someone from [facility] will contact you to arrange start date and time for therapy.  Contact information: [address] [place] [address] [phone]            Signed: Hardin Negus ERIC R 03/03/2016, 7:24 AM

## 2016-03-03 NOTE — Anesthesia Postprocedure Evaluation (Signed)
Anesthesia Post Note  Patient: [name]  Procedure(s) Performed: Procedure(s) (LRB): TOTAL HIP ARTHROPLASTY ANTERIOR APPROACH (Left)  Patient location during evaluation: PACU Anesthesia Type: General Level of consciousness: awake Pain management: pain level controlled Vital Signs Assessment: post-procedure vital signs reviewed and stable Respiratory status: spontaneous breathing Cardiovascular status: stable Postop Assessment: no signs of nausea or vomiting Anesthetic complications: no    Last Vitals:  Vitals:   03/02/16 2151 03/03/16 0544  BP: (!) 115/44 (!) 112/51  Pulse: 84 80  Resp: 16 16  Temp: 37.1 C 36.7 C    Last Pain:  Vitals:   03/03/16 0544  TempSrc: Oral  PainSc:                  Ameli Sangiovanni

## 2016-03-04 DIAGNOSIS — Z87891 Personal history of nicotine dependence: Secondary | ICD-10-CM | POA: Diagnosis not present

## 2016-03-04 DIAGNOSIS — Z471 Aftercare following joint replacement surgery: Secondary | ICD-10-CM | POA: Diagnosis not present

## 2016-03-04 DIAGNOSIS — I119 Hypertensive heart disease without heart failure: Secondary | ICD-10-CM | POA: Diagnosis not present

## 2016-03-04 DIAGNOSIS — I2510 Atherosclerotic heart disease of native coronary artery without angina pectoris: Secondary | ICD-10-CM | POA: Diagnosis not present

## 2016-03-04 DIAGNOSIS — J45909 Unspecified asthma, uncomplicated: Secondary | ICD-10-CM | POA: Diagnosis not present

## 2016-03-04 DIAGNOSIS — Z96642 Presence of left artificial hip joint: Secondary | ICD-10-CM | POA: Diagnosis not present

## 2016-03-04 DIAGNOSIS — Z7901 Long term (current) use of anticoagulants: Secondary | ICD-10-CM | POA: Diagnosis not present

## 2016-03-04 NOTE — Consult Note (Signed)
            [facility] CM Primary Care Navigator  03/04/2016  AVALYNNE KOMM 1941-01-31 [id number]   Wentto see patient to identify possible discharge needs butpatient was already discharged home with home health services.  Primary care provider's office contacted to notify of patient's discharge and need for possible post hospital follow-up and transition of care.  For additional questions please contact:  Edwena Felty A. Damoni Causby, BSN, RN-BC [facility] PRIMARY CARE Navigator Cell: [phone]

## 2016-03-06 DIAGNOSIS — Z7901 Long term (current) use of anticoagulants: Secondary | ICD-10-CM | POA: Diagnosis not present

## 2016-03-06 DIAGNOSIS — Z87891 Personal history of nicotine dependence: Secondary | ICD-10-CM | POA: Diagnosis not present

## 2016-03-06 DIAGNOSIS — I119 Hypertensive heart disease without heart failure: Secondary | ICD-10-CM | POA: Diagnosis not present

## 2016-03-06 DIAGNOSIS — J45909 Unspecified asthma, uncomplicated: Secondary | ICD-10-CM | POA: Diagnosis not present

## 2016-03-06 DIAGNOSIS — Z96642 Presence of left artificial hip joint: Secondary | ICD-10-CM | POA: Diagnosis not present

## 2016-03-06 DIAGNOSIS — Z471 Aftercare following joint replacement surgery: Secondary | ICD-10-CM | POA: Diagnosis not present

## 2016-03-06 DIAGNOSIS — I2510 Atherosclerotic heart disease of native coronary artery without angina pectoris: Secondary | ICD-10-CM | POA: Diagnosis not present

## 2016-03-10 DIAGNOSIS — I2510 Atherosclerotic heart disease of native coronary artery without angina pectoris: Secondary | ICD-10-CM | POA: Diagnosis not present

## 2016-03-10 DIAGNOSIS — Z96642 Presence of left artificial hip joint: Secondary | ICD-10-CM | POA: Diagnosis not present

## 2016-03-10 DIAGNOSIS — I119 Hypertensive heart disease without heart failure: Secondary | ICD-10-CM | POA: Diagnosis not present

## 2016-03-10 DIAGNOSIS — Z7901 Long term (current) use of anticoagulants: Secondary | ICD-10-CM | POA: Diagnosis not present

## 2016-03-10 DIAGNOSIS — Z87891 Personal history of nicotine dependence: Secondary | ICD-10-CM | POA: Diagnosis not present

## 2016-03-10 DIAGNOSIS — J45909 Unspecified asthma, uncomplicated: Secondary | ICD-10-CM | POA: Diagnosis not present

## 2016-03-10 DIAGNOSIS — Z471 Aftercare following joint replacement surgery: Secondary | ICD-10-CM | POA: Diagnosis not present

## 2016-03-13 DIAGNOSIS — J45909 Unspecified asthma, uncomplicated: Secondary | ICD-10-CM | POA: Diagnosis not present

## 2016-03-13 DIAGNOSIS — I2510 Atherosclerotic heart disease of native coronary artery without angina pectoris: Secondary | ICD-10-CM | POA: Diagnosis not present

## 2016-03-13 DIAGNOSIS — I119 Hypertensive heart disease without heart failure: Secondary | ICD-10-CM | POA: Diagnosis not present

## 2016-03-13 DIAGNOSIS — Z471 Aftercare following joint replacement surgery: Secondary | ICD-10-CM | POA: Diagnosis not present

## 2016-03-13 DIAGNOSIS — Z7901 Long term (current) use of anticoagulants: Secondary | ICD-10-CM | POA: Diagnosis not present

## 2016-03-13 DIAGNOSIS — Z87891 Personal history of nicotine dependence: Secondary | ICD-10-CM | POA: Diagnosis not present

## 2016-03-13 DIAGNOSIS — M1612 Unilateral primary osteoarthritis, left hip: Secondary | ICD-10-CM | POA: Diagnosis not present

## 2016-03-13 DIAGNOSIS — Z96642 Presence of left artificial hip joint: Secondary | ICD-10-CM | POA: Diagnosis not present

## 2016-03-15 ENCOUNTER — Emergency Department (HOSPITAL_COMMUNITY)
Admission: EM | Admit: 2016-03-15 | Discharge: 2016-03-15 | Disposition: A | Discharge status: Home / Self Care | Payer: Medicare Other | Attending: Emergency Medicine | Admitting: Emergency Medicine

## 2016-03-15 ENCOUNTER — Encounter (HOSPITAL_COMMUNITY): Payer: Self-pay

## 2016-03-15 DIAGNOSIS — Z87891 Personal history of nicotine dependence: Secondary | ICD-10-CM | POA: Diagnosis not present

## 2016-03-15 DIAGNOSIS — Z853 Personal history of malignant neoplasm of breast: Secondary | ICD-10-CM | POA: Diagnosis not present

## 2016-03-15 DIAGNOSIS — L509 Urticaria, unspecified: Secondary | ICD-10-CM

## 2016-03-15 DIAGNOSIS — Z79899 Other long term (current) drug therapy: Secondary | ICD-10-CM | POA: Insufficient documentation

## 2016-03-15 DIAGNOSIS — M549 Dorsalgia, unspecified: Secondary | ICD-10-CM | POA: Insufficient documentation

## 2016-03-15 DIAGNOSIS — R21 Rash and other nonspecific skin eruption: Secondary | ICD-10-CM | POA: Diagnosis present

## 2016-03-15 DIAGNOSIS — J45909 Unspecified asthma, uncomplicated: Secondary | ICD-10-CM | POA: Diagnosis not present

## 2016-03-15 DIAGNOSIS — I2510 Atherosclerotic heart disease of native coronary artery without angina pectoris: Secondary | ICD-10-CM | POA: Diagnosis not present

## 2016-03-15 DIAGNOSIS — I119 Hypertensive heart disease without heart failure: Secondary | ICD-10-CM | POA: Insufficient documentation

## 2016-03-15 MED ORDER — PREDNISONE 10 MG PO TABS: 40.0000 mg | ORAL_TABLET | Freq: Every day | ORAL | 0 refills | Status: DC

## 2016-03-15 MED ORDER — FAMOTIDINE 20 MG PO TABS: 20.0000 mg | ORAL_TABLET | Freq: Two times a day (BID) | ORAL | 0 refills | Status: DC

## 2016-03-15 NOTE — Discharge Instructions (Signed)
Continue take the Benadryl 25 mg every 6 hours. Start taking the Pepcid twice a day for 7 days. Prescription provided with for prednisone but check with your orthopedic surgeon on Monday whether it's okay to take that or not. Did not take that until cleared by them. Return for any lip swelling tongue swelling or trouble breathing.

## 2016-03-15 NOTE — ED Provider Notes (Signed)
[facility] DEPT Provider Note   CSN: [id number] Arrival date & time: 03/15/16  [id number]  By signing my name below, I, Hansel Feinstein, attest that this documentation has been prepared under the direction and in the presence of Fredia Sorrow, MD. Electronically Signed: Hansel Feinstein, ED Scribe. 03/15/16. 8:54 AM.     History   Chief Complaint Chief Complaint  Patient presents with  . Rash    HPI [name] is a 75 y.o. female s/p total hip arthroplasty 02/29/16 who presents to the Emergency Department complaining of a gradually worsening pruritic rash to her scalp, BLE, neck and shoulders onset this week. Pt states she was started on Eliquis and a muscle relaxer after her recent surgery; however, stopped taking the medications 2 days ago. Pt states this is the first time she has taken Eliquis. She reports she has taken 25 mg Benadryl without relief of symptoms. She also notes she has been taking 2 Hydrocodone per day for chronic back pain, but this is not a new medication for her. Pt denies fever, congestion, rhinorrhea, sore throat, visual disturbance, cough, SOB, wheezing, CP, leg swelling, abdominal pain, diarrhea, nausea, vomiting, dysuria, hematuria, back pain, neck pain, HA, bruising easily, tongue swelling, lip swelling.    The history is provided by the patient. No language interpreter was used.  Rash   This is a new problem. The current episode started more than 2 days ago. The problem has been gradually worsening. The problem is associated with new medications. There has been no fever. The rash is present on the scalp, trunk, right upper leg and left upper leg. The patient is experiencing no pain. Associated symptoms include itching. She has tried antihistamines for the symptoms. The treatment provided no relief. Risk factors include new medications.    Past Medical History:  Diagnosis Date  . Anemia    "little bit"  . Arthritis    back  . Breast cancer ([place])    Right  mastectomy  . Bruises easily   . Chronic back pain   . Coronary atherosclerosis of native coronary artery    Nonobstructive at catheterization 2008 - 40% circumflex  . Cystocele 02/20/2015  . Diverticula of colon   . Environmental allergies   . Fitting and adjustment of pessary 05/29/2015   Inserted milex ring with support #2   . GERD (gastroesophageal reflux disease)    doesn't take any meds from this.  Takes tums as neeeded.  . Hematuria 02/20/2015  . History of bronchitis    >30yrs ago  . History of colon polyps   . History of diverticulosis 05/04/2015  . History of migraine    last time a few months ago  . Insomnia    doesn't take any meds  . Kidney infection    history of  . Nocturia   . Panic attacks    no meds required  . Pelvic pain in female 02/20/2015  . 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 Rectal pain, chronic 05/18/2015  . Seasonal allergies   . Shortness of breath    with exertion  . Urinary frequency   . Urinary urgency     Patient Active Problem List   Diagnosis Date Noted  . Primary localized osteoarthritis of left hip 02/29/2016  . Primary osteoarthritis of left hip 02/22/2016  . Fitting and adjustment of pessary 05/29/2015  . Rectal pain, chronic 05/18/2015  . History of diverticulosis 05/04/2015  . Pelvic pain in female 02/20/2015  .  Cystocele 02/20/2015  . 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 Hematuria 02/20/2015  . Abnormal findings on diagnostic imaging of liver and biliary tract 12/06/2014  . Dysphagia, pharyngoesophageal phase   . Mucosal abnormality of stomach   . Diverticulosis of colon without hemorrhage   . Rectal bleeding 07/25/2014  . Left sided abdominal pain 03/01/2014  . Heme positive stool 03/01/2014  . Esophageal dysphagia 04/04/2013  . Anemia 04/04/2013  . Abnormal LFTs 04/04/2013  . Change in bowel habits 04/04/2013  . Constipation 04/04/2013  . 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 Degeneration of intervertebral disc, site unspecified 12/15/2012  . Shortness of breath 01/14/2012  . Hypertensive heart disease 01/14/2012  . Coronary atherosclerosis of native coronary artery 12/14/2009  . ASTHMA 12/14/2009  . GERD 12/14/2009  . BREAST CANCER, HX OF 12/14/2009    Past Surgical History:  Procedure Laterality Date  . ABDOMINAL HYSTERECTOMY    . BACK SURGERY  1981/2011/2014  . BLADDER SUSPENSION    . BREAST RECONSTRUCTION  2004   with abd tissue  . BREAST SURGERY Right   . CHOLECYSTECTOMY    . COLONOSCOPY  12/2009   RMR: few pancolonic diverticula. next TCS 12/2014  . COLONOSCOPY  06/14/2007   [id number] rectal polyp, status post cold biopsy removal/ Anal canal hemorrhoids/Left-sided diverticula Colonic mucosa appeared normal.Hyperplastic polyp.   . COLONOSCOPY  01/18/2004   [id number] diverticula/Internal hemorrhoids.  Otherwise normal rectum  . COLONOSCOPY N/A 04/18/2013   Dr. Gala Romney- normal rectum, scattered left sided diverticula the remainder of the colonic mucosa appeared normal  . COLONOSCOPY N/A 08/18/2014   Procedure: COLONOSCOPY;  Surgeon: Daneil Dolin, MD;  [facility];  Service: Endoscopy;  Laterality: N/A;  1000  . ESOPHAGOGASTRODUODENOSCOPY  06/14/2007   [id number] plaques in esophageal mucosa of uncertain significance brushed and biopsied/Normal stomach, normal first duodenum and second duodenoscopy. KOH negative. esophageal bx c/w GERD  . ESOPHAGOGASTRODUODENOSCOPY N/A 08/18/2014   Procedure: ESOPHAGOGASTRODUODENOSCOPY (EGD);  Surgeon: Daneil Dolin, MD;  [facility];  Service: Endoscopy;  Laterality: N/A;  . ESOPHAGOGASTRODUODENOSCOPY (EGD) WITH ESOPHAGEAL DILATION N/A 04/18/2013   Dr. Gala Romney- normal, patent appearing, tubular esophagus, normal gastric mucosa, paptent pylorus, normal first and second portion of the duodenum  . EYE SURGERY     lasik  . FOOT SURGERY Right 2014   d/t plantar fascitis   . GIVENS CAPSULE STUDY N/A 07/18/2013   L.Lewis PAC- markedly abnormal appearing gastric mucosa, sugnificant bile reflux, questionable subucosal small bowel mass versus extrinsic compression in the distal small bowel.- CTE= no small bowel mass or tumor seen.  Marland Kitchen MALONEY DILATION N/A 08/18/2014   Procedure: Venia Minks DILATION;  Surgeon: Daneil Dolin, MD;  [facility];  Service: Endoscopy;  Laterality: N/A;  . MASTECTOMY     right side 12-13 years ago  . TOTAL HIP ARTHROPLASTY Left 02/29/2016  . TOTAL HIP ARTHROPLASTY Left 02/29/2016   Procedure: TOTAL HIP ARTHROPLASTY ANTERIOR APPROACH;  Surgeon: Frederik Pear, MD;  Location: [facility];  Service: Orthopedics;  Laterality: Left;    OB History    Gravida Para Term Preterm AB Living   3 3 3     4    SAB TAB Ectopic Multiple Live Births         1 4       Home Medications    Prior to Admission medications   Medication Sig Start Date End Date Taking? Authorizing Provider  amLODipine (NORVASC) 5  MG tablet Take 5 mg by mouth daily.  02/03/14   Historical Provider, MD  apixaban (ELIQUIS) 2.5 MG TABS tablet Take 1 tablet (2.5 mg total) by mouth 2 (two) times daily. 02/29/16   Leighton Parody, PA-C  famotidine (PEPCID) 20 MG tablet Take 1 tablet (20 mg total) by mouth 2 (two) times daily. 03/15/16   Fredia Sorrow, MD  oxyCODONE-acetaminophen (ROXICET) 5-325 MG tablet Take 1 tablet by mouth every 4 (four) hours as needed. 02/29/16   Leighton Parody, PA-C  polyethylene glycol powder [facility]) powder Take 17 g daily in fluid Patient not taking: Reported on 02/13/2016 05/04/15   Estill Dooms, NP  predniSONE (DELTASONE) 10 MG tablet Take 4 tablets (40 mg total) by mouth daily. 03/15/16   Fredia Sorrow, MD  tiZANidine (ZANAFLEX) 2 MG tablet Take 1 tablet (2 mg total) by mouth every 6 (six) hours as needed for muscle spasms. 02/29/16   Leighton Parody, PA-C    Family History Family History  Problem Relation Age of Onset  . Coronary  artery disease Mother   . Coronary artery disease Father   . Coronary artery disease Brother   . Cancer Sister     lung cancer, two sisters  . Colon cancer Brother     less than age 1  . Lung cancer Sister   . Cancer Brother   . Emphysema Brother     Social History Social History  Substance Use Topics  . Smoking status: Former Smoker    Packs/day: 1.50    Years: 30.00    Types: Cigarettes    Quit date: 04/28/2000  . Smokeless tobacco: Never Used     Comment: quit about 39yrs ago  . Alcohol use No     Allergies   Penicillins and Aspirin   Review of Systems Review of Systems  Constitutional: Negative for fever.  HENT: Negative for congestion, facial swelling, rhinorrhea and sore throat.   Eyes: Negative for visual disturbance.  Respiratory: Negative for cough, shortness of breath and wheezing.   Cardiovascular: Negative for chest pain and leg swelling.  Gastrointestinal: Negative for abdominal pain, diarrhea, nausea and vomiting.  Genitourinary: Negative for dysuria and hematuria.  Musculoskeletal: Positive for back pain (baseline). Negative for neck pain.  Skin: Positive for itching and rash.  Neurological: Negative for headaches.  Hematological: Bruises/bleeds easily (coming off eliquis).  Psychiatric/Behavioral: Negative for confusion.    Physical Exam Updated Vital Signs BP 154/81 (BP Location: Right Arm)   Pulse 83   Temp 97.8 F (36.6 C) (Oral)   Resp 17   Ht 5\' 5"  (1.651 m)   Wt 69.9 kg   SpO2 95%   BMI 25.63 kg/m   Physical Exam  Constitutional: She is oriented to person, place, and time. She appears well-developed and well-nourished.  HENT:  Head: Normocephalic.  Mucous membranes moist. No tongue or lip swelling.   Eyes: Conjunctivae and EOM are normal. Pupils are equal, round, and reactive to light. No scleral icterus.  Cardiovascular: Normal rate, regular rhythm and normal heart sounds.   Pulmonary/Chest: Effort normal and breath sounds  normal. No respiratory distress. She has no wheezes. She has no rales.  Lungs CTA bilaterally.   Abdominal: Soft. Bowel sounds are normal. She exhibits no distension. There is no tenderness.  Musculoskeletal: Normal range of motion. She exhibits no edema.  No ankle swelling. Left hip incision healing well. No signs of infection.   Neurological: She is alert and oriented to person, place,  and time. No cranial nerve deficit or sensory deficit. She exhibits normal muscle tone. Coordination normal.  Skin: Skin is warm and dry. Rash noted. There is erythema.  Hive-like erythematous rash that blanches present to the scalp, shoulders and legs.   Psychiatric: She has a normal mood and affect. Her behavior is normal.  Nursing note and vitals reviewed.    ED Treatments / Results   DIAGNOSTIC STUDIES: Oxygen Saturation is 95% on RA, normal by my interpretation.    COORDINATION OF CARE: 8:41 AM Discussed treatment plan with pt at bedside and pt agreed to plan.    Labs (all labs ordered are listed, but only abnormal results are displayed) Labs Reviewed - No data to display  EKG  EKG Interpretation None       Radiology No results found.  Procedures Procedures (including critical care time)  Medications Ordered in ED Medications - No data to display   Initial Impression / Assessment and Plan / ED Course  I have reviewed the triage vital signs and the nursing notes.  Pertinent labs & imaging results that were available during my care of the patient were reviewed by me and considered in my medical decision making (see chart for details).  Clinical Course     Patient with hive-like rash. No tongue swelling no lip swelling no wheezing. Patient currently taking Benadryl. Will add Pepcid. Ideally would prefer to start her on prednisone. The patient is 2 weeks out of left hip replacement surgery. We'll give her the prescription for prednisone and have her confirm with her orthopedic  doctor whether they're okay with her taking prednisone. Most likely Eliquist is the culprit. She stopped that just the 2 days ago. Patient will return for any new or worse symptoms. No other new medications.  Final Clinical Impressions(s) / ED Diagnoses   Final diagnoses:  Urticaria    New Prescriptions New Prescriptions   FAMOTIDINE (PEPCID) 20 MG TABLET    Take 1 tablet (20 mg total) by mouth 2 (two) times daily.   PREDNISONE (DELTASONE) 10 MG TABLET    Take 4 tablets (40 mg total) by mouth daily.    I personally performed the services described in this documentation, which was scribed in my presence. The recorded information has been reviewed and is accurate.      Fredia Sorrow, MD 03/15/16 [phone]

## 2016-03-15 NOTE — ED Triage Notes (Signed)
Pt reports rash since Wednesday, that itches. Went to see surgeon on Thursday and was told take benadryl without relief.Pt reports hip replacement on 02/29/16.

## 2016-04-17 DIAGNOSIS — G894 Chronic pain syndrome: Secondary | ICD-10-CM | POA: Diagnosis not present

## 2016-04-17 DIAGNOSIS — R5383 Other fatigue: Secondary | ICD-10-CM | POA: Diagnosis not present

## 2016-04-17 DIAGNOSIS — D509 Iron deficiency anemia, unspecified: Secondary | ICD-10-CM | POA: Diagnosis not present

## 2016-04-17 DIAGNOSIS — M25552 Pain in left hip: Secondary | ICD-10-CM | POA: Diagnosis not present

## 2016-04-17 DIAGNOSIS — M545 Low back pain: Secondary | ICD-10-CM | POA: Diagnosis not present

## 2016-04-17 DIAGNOSIS — F5101 Primary insomnia: Secondary | ICD-10-CM | POA: Diagnosis not present

## 2016-05-06 DIAGNOSIS — H52203 Unspecified astigmatism, bilateral: Secondary | ICD-10-CM | POA: Diagnosis not present

## 2016-05-06 DIAGNOSIS — H25013 Cortical age-related cataract, bilateral: Secondary | ICD-10-CM | POA: Diagnosis not present

## 2016-05-06 DIAGNOSIS — H2513 Age-related nuclear cataract, bilateral: Secondary | ICD-10-CM | POA: Diagnosis not present

## 2016-05-06 DIAGNOSIS — H524 Presbyopia: Secondary | ICD-10-CM | POA: Diagnosis not present

## 2016-05-06 DIAGNOSIS — H5203 Hypermetropia, bilateral: Secondary | ICD-10-CM | POA: Diagnosis not present

## 2016-05-07 DIAGNOSIS — H2513 Age-related nuclear cataract, bilateral: Secondary | ICD-10-CM | POA: Diagnosis not present

## 2016-05-28 DIAGNOSIS — Z96642 Presence of left artificial hip joint: Secondary | ICD-10-CM | POA: Diagnosis not present

## 2016-05-28 DIAGNOSIS — M25552 Pain in left hip: Secondary | ICD-10-CM | POA: Diagnosis not present

## 2016-05-28 DIAGNOSIS — Z471 Aftercare following joint replacement surgery: Secondary | ICD-10-CM | POA: Diagnosis not present

## 2016-06-13 DIAGNOSIS — R944 Abnormal results of kidney function studies: Secondary | ICD-10-CM | POA: Diagnosis not present

## 2016-06-13 DIAGNOSIS — M19049 Primary osteoarthritis, unspecified hand: Secondary | ICD-10-CM | POA: Diagnosis not present

## 2016-07-14 DIAGNOSIS — D509 Iron deficiency anemia, unspecified: Secondary | ICD-10-CM | POA: Diagnosis not present

## 2016-07-14 DIAGNOSIS — Z1159 Encounter for screening for other viral diseases: Secondary | ICD-10-CM | POA: Diagnosis not present

## 2016-07-14 DIAGNOSIS — I10 Essential (primary) hypertension: Secondary | ICD-10-CM | POA: Diagnosis not present

## 2016-07-14 DIAGNOSIS — R7301 Impaired fasting glucose: Secondary | ICD-10-CM | POA: Diagnosis not present

## 2016-07-16 DIAGNOSIS — D509 Iron deficiency anemia, unspecified: Secondary | ICD-10-CM | POA: Diagnosis not present

## 2016-07-16 DIAGNOSIS — R7301 Impaired fasting glucose: Secondary | ICD-10-CM | POA: Diagnosis not present

## 2016-07-16 DIAGNOSIS — E782 Mixed hyperlipidemia: Secondary | ICD-10-CM | POA: Diagnosis not present

## 2016-07-16 DIAGNOSIS — K219 Gastro-esophageal reflux disease without esophagitis: Secondary | ICD-10-CM | POA: Diagnosis not present

## 2016-07-16 DIAGNOSIS — I10 Essential (primary) hypertension: Secondary | ICD-10-CM | POA: Diagnosis not present

## 2016-07-16 DIAGNOSIS — Z0000 Encounter for general adult medical examination without abnormal findings: Secondary | ICD-10-CM | POA: Diagnosis not present

## 2016-07-28 DIAGNOSIS — M25559 Pain in unspecified hip: Secondary | ICD-10-CM | POA: Diagnosis not present

## 2016-07-28 DIAGNOSIS — M79643 Pain in unspecified hand: Secondary | ICD-10-CM | POA: Diagnosis not present

## 2016-07-28 DIAGNOSIS — R600 Localized edema: Secondary | ICD-10-CM | POA: Diagnosis not present

## 2016-07-28 DIAGNOSIS — R945 Abnormal results of liver function studies: Secondary | ICD-10-CM | POA: Diagnosis not present

## 2016-07-28 DIAGNOSIS — G2581 Restless legs syndrome: Secondary | ICD-10-CM | POA: Diagnosis not present

## 2016-09-02 DIAGNOSIS — M1611 Unilateral primary osteoarthritis, right hip: Secondary | ICD-10-CM | POA: Diagnosis not present

## 2016-09-02 DIAGNOSIS — M25551 Pain in right hip: Secondary | ICD-10-CM | POA: Diagnosis not present

## 2016-10-15 DIAGNOSIS — M2550 Pain in unspecified joint: Secondary | ICD-10-CM | POA: Diagnosis not present

## 2016-10-15 DIAGNOSIS — M7989 Other specified soft tissue disorders: Secondary | ICD-10-CM | POA: Diagnosis not present

## 2016-10-15 DIAGNOSIS — R5383 Other fatigue: Secondary | ICD-10-CM | POA: Diagnosis not present

## 2016-10-27 DIAGNOSIS — R5383 Other fatigue: Secondary | ICD-10-CM | POA: Diagnosis not present

## 2016-10-27 DIAGNOSIS — R945 Abnormal results of liver function studies: Secondary | ICD-10-CM | POA: Diagnosis not present

## 2016-10-27 DIAGNOSIS — M0579 Rheumatoid arthritis with rheumatoid factor of multiple sites without organ or systems involvement: Secondary | ICD-10-CM | POA: Diagnosis not present

## 2016-10-27 DIAGNOSIS — M2550 Pain in unspecified joint: Secondary | ICD-10-CM | POA: Diagnosis not present

## 2016-10-28 DIAGNOSIS — I10 Essential (primary) hypertension: Secondary | ICD-10-CM | POA: Diagnosis not present

## 2016-10-28 DIAGNOSIS — R7301 Impaired fasting glucose: Secondary | ICD-10-CM | POA: Diagnosis not present

## 2016-10-31 DIAGNOSIS — G2581 Restless legs syndrome: Secondary | ICD-10-CM | POA: Diagnosis not present

## 2016-10-31 DIAGNOSIS — R7301 Impaired fasting glucose: Secondary | ICD-10-CM | POA: Diagnosis not present

## 2016-10-31 DIAGNOSIS — I10 Essential (primary) hypertension: Secondary | ICD-10-CM | POA: Diagnosis not present

## 2016-10-31 DIAGNOSIS — R945 Abnormal results of liver function studies: Secondary | ICD-10-CM | POA: Diagnosis not present

## 2016-10-31 DIAGNOSIS — D509 Iron deficiency anemia, unspecified: Secondary | ICD-10-CM | POA: Diagnosis not present

## 2016-11-19 DIAGNOSIS — M1611 Unilateral primary osteoarthritis, right hip: Secondary | ICD-10-CM | POA: Diagnosis not present

## 2016-11-27 ENCOUNTER — Other Ambulatory Visit (HOSPITAL_COMMUNITY): Payer: Self-pay | Admitting: Internal Medicine

## 2016-11-27 DIAGNOSIS — Z1231 Encounter for screening mammogram for malignant neoplasm of breast: Secondary | ICD-10-CM

## 2016-12-23 NOTE — Pre-Procedure Instructions (Signed)
MARIAFERNANDA HENDRICKSEN  12/23/2016      [facility] - [address], [place], [place] - [place] [address] Phone: [phone] Fax: [phone]    Your procedure is scheduled on Monday September 10.  Report to [facility] Admitting at 8:15 A.M.  Call this number if you have problems the morning of surgery:  [phone]   Remember:  Do not eat food or drink liquids after midnight.  Take these medicines the morning of surgery with A SIP OF WATER: amlodipine (norvasc), famotidine (pepcid), oxycodone (Roxicet) if needed, tizanidine (zanaflex) if needed  7 days prior to surgery STOP taking any Aspirin, Aleve, Naproxen, Ibuprofen, Motrin, Advil, Goody's, BC's, all herbal medications, fish oil, and all vitamins  FOLLOW MD's INSTRUCTIONS on stopping Xarelto   Do not wear jewelry, make-up or nail polish.  Do not wear lotions, powders, or perfumes, or deoderant.  Do not shave 48 hours prior to surgery.  Men may shave face and neck.  Do not bring valuables to the hospital.  [facility] is not responsible for any belongings or valuables.  Contacts, dentures or bridgework may not be worn into surgery.  Leave your suitcase in the car.  After surgery it may be brought to your room.  For patients admitted to the hospital, discharge time will be determined by your treatment team.  Patients discharged the day of surgery will not be allowed to drive home.    Special instructions:    [facility]- Preparing For Surgery  Before surgery, you can play an important role. Because skin is not sterile, your skin needs to be as free of germs as possible. You can reduce the number of germs on your skin by washing with CHG (chlorahexidine gluconate) Soap before surgery.  CHG is an antiseptic cleaner which kills germs and bonds with the skin to continue killing germs even after washing.  Please do not use if you have an allergy to CHG or antibacterial soaps. If your  skin becomes reddened/irritated stop using the CHG.  Do not shave (including legs and underarms) for at least 48 hours prior to first CHG shower. It is OK to shave your face.  Please follow these instructions carefully.   1. Shower the NIGHT BEFORE SURGERY and the MORNING OF SURGERY with CHG.   2. If you chose to wash your hair, wash your hair first as usual with your normal shampoo.  3. After you shampoo, rinse your hair and body thoroughly to remove the shampoo.  4. Use CHG as you would any other liquid soap. You can apply CHG directly to the skin and wash gently with a scrungie or a clean washcloth.   5. Apply the CHG Soap to your body ONLY FROM THE NECK DOWN.  Do not use on open wounds or open sores. Avoid contact with your eyes, ears, mouth and genitals (private parts). Wash genitals (private parts) with your normal soap.  6. Wash thoroughly, paying special attention to the area where your surgery will be performed.  7. Thoroughly rinse your body with warm water from the neck down.  8. DO NOT shower/wash with your normal soap after using and rinsing off the CHG Soap.  9. Pat yourself dry with a CLEAN TOWEL.   10. Wear CLEAN PAJAMAS   11. Place CLEAN SHEETS on your bed the night of your first shower and DO NOT SLEEP WITH PETS.    Day of Surgery: Do  not apply any deodorants/lotions. Please wear clean clothes to the hospital/surgery center.      Please read over the following fact sheets that you were given. Total Joint Packet and MRSA Information

## 2016-12-24 ENCOUNTER — Encounter (HOSPITAL_COMMUNITY)
Admission: RE | Admit: 2016-12-24 | Discharge: 2016-12-24 | Disposition: A | Discharge status: Home / Self Care | Payer: Medicare Other | Source: Ambulatory Visit | Attending: Orthopedic Surgery | Admitting: Orthopedic Surgery

## 2016-12-24 ENCOUNTER — Encounter (HOSPITAL_COMMUNITY): Payer: Self-pay

## 2016-12-24 DIAGNOSIS — M1611 Unilateral primary osteoarthritis, right hip: Secondary | ICD-10-CM | POA: Diagnosis not present

## 2016-12-24 DIAGNOSIS — Z01818 Encounter for other preprocedural examination: Secondary | ICD-10-CM | POA: Diagnosis not present

## 2016-12-24 LAB — PROTIME-INR
INR: 0.93
Prothrombin Time: 12.4 seconds (ref 11.4–15.2)

## 2016-12-24 LAB — BASIC METABOLIC PANEL
Anion gap: 9 (ref 5–15)
BUN: 21 mg/dL — ABNORMAL HIGH (ref 6–20)
CO2: 28 mmol/L (ref 22–32)
Calcium: 9.7 mg/dL (ref 8.9–10.3)
Chloride: 102 mmol/L (ref 101–111)
Creatinine, Ser: 0.94 mg/dL (ref 0.44–1.00)
GFR calc Af Amer: >60 mL/min (ref >60)
GFR calc non Af Amer: 57 mL/min — ABNORMAL LOW (ref >60)
Glucose, Bld: 98 mg/dL (ref 65–99)
Potassium: 4.6 mmol/L (ref 3.5–5.1)
Sodium: 139 mmol/L (ref 135–145)

## 2016-12-24 LAB — CBC WITH DIFFERENTIAL/PLATELET
Basophils Absolute: 0.1 10*3/uL (ref 0.0–0.1)
Basophils Relative: 1 %
Eosinophils Absolute: 0.4 10*3/uL (ref 0.0–0.7)
Eosinophils Relative: 4 %
HCT: 33.5 % — ABNORMAL LOW (ref 36.0–46.0)
Hemoglobin: 11.3 g/dL — ABNORMAL LOW (ref 12.0–15.0)
Lymphocytes Relative: 47 %
Lymphs Abs: 4.5 10*3/uL — ABNORMAL HIGH (ref 0.7–4.0)
MCH: 30.8 pg (ref 26.0–34.0)
MCHC: 33.7 g/dL (ref 30.0–36.0)
MCV: 91.3 fL (ref 78.0–100.0)
Monocytes Absolute: 0.8 10*3/uL (ref 0.1–1.0)
Monocytes Relative: 9 %
Neutro Abs: 3.8 10*3/uL (ref 1.7–7.7)
Neutrophils Relative %: 39 %
Platelets: 305 10*3/uL (ref 150–400)
RBC: 3.67 MIL/uL — ABNORMAL LOW (ref 3.87–5.11)
RDW: 13.1 % (ref 11.5–15.5)
WBC: 9.6 10*3/uL (ref 4.0–10.5)

## 2016-12-24 LAB — TYPE AND SCREEN
ABO/RH(D): A NEG
Antibody Screen: NEGATIVE

## 2016-12-24 LAB — URINALYSIS, ROUTINE W REFLEX MICROSCOPIC
Bilirubin Urine: NEGATIVE
Glucose, UA: NEGATIVE mg/dL
Hgb urine dipstick: NEGATIVE
Ketones, ur: NEGATIVE mg/dL
Nitrite: NEGATIVE
Protein, ur: NEGATIVE mg/dL
Specific Gravity, Urine: <1.005 — ABNORMAL LOW (ref 1.005–1.030)
pH: 6.5 (ref 5.0–8.0)

## 2016-12-24 LAB — URINALYSIS, MICROSCOPIC (REFLEX): Bacteria, UA: NONE SEEN

## 2016-12-24 LAB — SURGICAL PCR SCREEN
MRSA, PCR: NEGATIVE
Staphylococcus aureus: NEGATIVE

## 2016-12-24 LAB — APTT: aPTT: 26 seconds (ref 24–36)

## 2016-12-24 NOTE — Progress Notes (Addendum)
PCP: Allyn Kenner, MD  Cardiologist: pt denies  EKG: 02/18/2016 in [facility]  Stress test: 01/2012 in [facility]  ECHO: pt denies ever  Cardiac Cath: pt reports she may have had one years ago after an abnormal stress test but if she did it did not show any abnormalities, she cannot recall who performed testing   Chest x-ray: 02/18/16 in [facility]

## 2017-01-02 DIAGNOSIS — M1611 Unilateral primary osteoarthritis, right hip: Secondary | ICD-10-CM | POA: Diagnosis present

## 2017-01-02 MED ORDER — TRANEXAMIC ACID 1000 MG/10ML IV SOLN
2000.0000 mg | INTRAVENOUS | Status: DC
  Filled 2017-01-02: qty 20

## 2017-01-02 NOTE — H&P (Signed)
TOTAL HIP ADMISSION H&P  Patient is admitted for right total hip arthroplasty.  Subjective:  Chief Complaint: right hip pain  HPI: [name], 76 y.o. female, has a history of pain and functional disability in the right hip(s) due to arthritis and patient has failed non-surgical conservative treatments for greater than 12 weeks to include NSAID's and/or analgesics, use of assistive devices and activity modification.  Onset of symptoms was gradual starting several years ago with gradually worsening course since that time.The patient noted no past surgery on the right hip(s).  Patient currently rates pain in the right hip at 10 out of 10 with activity. Patient has night pain, worsening of pain with activity and weight bearing, trendelenberg gait, pain that interfers with activities of daily living and pain with passive range of motion. Patient has evidence of joint space narrowing by imaging studies. This condition presents safety issues increasing the risk of falls.   There is no current active infection.  Patient Active Problem List   Diagnosis Date Noted  . Primary localized osteoarthritis of left hip 02/29/2016  . Primary osteoarthritis of left hip 02/22/2016  . Fitting and adjustment of pessary 05/29/2015  . Rectal pain, chronic 05/18/2015  . History of diverticulosis 05/04/2015  . Pelvic pain in female 02/20/2015  . Cystocele 02/20/2015  . 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 Hematuria 02/20/2015  . Abnormal findings on diagnostic imaging of liver and biliary tract 12/06/2014  . Dysphagia, pharyngoesophageal phase   . Mucosal abnormality of stomach   . Diverticulosis of colon without hemorrhage   . Rectal bleeding 07/25/2014  . Left sided abdominal pain 03/01/2014  . Heme positive stool 03/01/2014  . Esophageal dysphagia 04/04/2013  . Anemia 04/04/2013  . Abnormal LFTs 04/04/2013  . Change in bowel habits 04/04/2013  . Constipation 04/04/2013  .  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 Degeneration of intervertebral disc, site unspecified 12/15/2012  . Shortness of breath 01/14/2012  . Hypertensive heart disease 01/14/2012  . Coronary atherosclerosis of native coronary artery 12/14/2009  . ASTHMA 12/14/2009  . GERD 12/14/2009  . BREAST CANCER, HX OF 12/14/2009   Past Medical History:  Diagnosis Date  . Anemia    "little bit"  . Arthritis    back  . Breast cancer ([place])    Right mastectomy  . Bruises easily   . Chronic back pain   . Coronary atherosclerosis of native coronary artery    Nonobstructive at catheterization 2008 - 40% circumflex  . Cystocele 02/20/2015  . Diverticula of colon   . Environmental allergies   . Fitting and adjustment of pessary 05/29/2015   Inserted milex ring with support #2   . GERD (gastroesophageal reflux disease)    doesn't take any meds from this.  Takes tums as neeeded.  . Hematuria 02/20/2015  . History of bronchitis    >88yrs ago  . History of colon polyps   . History of diverticulosis 05/04/2015  . History of migraine    last time a few months ago  . Insomnia    doesn't take any meds  . Kidney infection    history of  . Nocturia   . Panic attacks    no meds required  . Pelvic pain in female 02/20/2015  . 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 Rectal pain, chronic 05/18/2015  . Seasonal allergies   . Shortness of breath    with exertion  . Urinary frequency   .  Urinary urgency     Past Surgical History:  Procedure Laterality Date  . ABDOMINAL HYSTERECTOMY    . BACK SURGERY  1981/2011/2014  . BLADDER SUSPENSION    . BREAST RECONSTRUCTION  2004   with abd tissue  . BREAST SURGERY Right   . CHOLECYSTECTOMY    . COLONOSCOPY  12/2009   RMR: few pancolonic diverticula. next TCS 12/2014  . COLONOSCOPY  06/14/2007   DPO:EUMPNTIRWE rectal polyp, status post cold biopsy removal/ Anal canal hemorrhoids/Left-sided diverticula Colonic mucosa  appeared normal.Hyperplastic polyp.   . COLONOSCOPY  01/18/2004   RXV:[id number] diverticula/Internal hemorrhoids.  Otherwise normal rectum  . COLONOSCOPY N/A 04/18/2013   Dr. Gala Romney- normal rectum, scattered left sided diverticula the remainder of the colonic mucosa appeared normal  . COLONOSCOPY N/A 08/18/2014   Procedure: COLONOSCOPY;  Surgeon: Daneil Dolin, MD;  [facility];  Service: Endoscopy;  Laterality: N/A;  1000  . ESOPHAGOGASTRODUODENOSCOPY  06/14/2007   PPJ:[id number] plaques in esophageal mucosa of uncertain significance brushed and biopsied/Normal stomach, normal first duodenum and second duodenoscopy. KOH negative. esophageal bx c/w GERD  . ESOPHAGOGASTRODUODENOSCOPY N/A 08/18/2014   Procedure: ESOPHAGOGASTRODUODENOSCOPY (EGD);  Surgeon: Daneil Dolin, MD;  [facility];  Service: Endoscopy;  Laterality: N/A;  . ESOPHAGOGASTRODUODENOSCOPY (EGD) WITH ESOPHAGEAL DILATION N/A 04/18/2013   Dr. Gala Romney- normal, patent appearing, tubular esophagus, normal gastric mucosa, paptent pylorus, normal first and second portion of the duodenum  . EYE SURGERY     lasik  . FOOT SURGERY Right 2014   d/t plantar fascitis  . GIVENS CAPSULE STUDY N/A 07/18/2013   L.Lewis PAC- markedly abnormal appearing gastric mucosa, sugnificant bile reflux, questionable subucosal small bowel mass versus extrinsic compression in the distal small bowel.- CTE= no small bowel mass or tumor seen.  Marland Kitchen MALONEY DILATION N/A 08/18/2014   Procedure: Venia Minks DILATION;  Surgeon: Daneil Dolin, MD;  [facility];  Service: Endoscopy;  Laterality: N/A;  . MASTECTOMY     right side 12-13 years ago  . TOTAL HIP ARTHROPLASTY Left 02/29/2016  . TOTAL HIP ARTHROPLASTY Left 02/29/2016   Procedure: TOTAL HIP ARTHROPLASTY ANTERIOR APPROACH;  Surgeon: Frederik Pear, MD;  Location: [place];  Service: Orthopedics;  Laterality: Left;    No prescriptions prior to admission.   Allergies  Allergen Reactions  .  Penicillins Anaphylaxis, Swelling and Other (See Comments)    Blisters Has patient had a PCN reaction causing immediate rash, facial/tongue/throat swelling, SOB or lightheadedness with hypotension: Yes Has patient had a PCN reaction causing severe rash involving mucus membranes or skin necrosis: No Has patient had a PCN reaction that required hospitalization No Has patient had a PCN reaction occurring within the last 10 years: No If all of the above answers are "NO", then may proceed with Cephalosporin use.   Marland Kitchen Apixaban Hives  . Aspirin Other (See Comments)    Causes hemoptysis    Social History  Substance Use Topics  . Smoking status: Former Smoker    Packs/day: 1.50    Years: 30.00    Types: Cigarettes    Quit date: 04/28/2000  . Smokeless tobacco: Never Used     Comment: quit about 22yrs ago  . Alcohol use No    Family History  Problem Relation Age of Onset  . Coronary artery disease Mother   . Coronary artery disease Father   . Coronary artery disease Brother   . Cancer Sister        lung cancer, two  sisters  . Colon cancer Brother        less than age 33  . Lung cancer Sister   . Cancer Brother   . Emphysema Brother      Review of Systems  Constitutional: Negative.   HENT: Negative.   Eyes: Negative.   Respiratory: Negative.   Cardiovascular: Negative.   Gastrointestinal: Negative.   Genitourinary: Negative.   Musculoskeletal: Positive for joint pain.  Skin: Negative.   Neurological: Negative.   Endo/Heme/Allergies: Negative.   Psychiatric/Behavioral: Negative.     Objective:  Physical Exam  Constitutional: She is oriented to person, place, and time. She appears well-developed and well-nourished.  HENT:  Head: Normocephalic and atraumatic.  Eyes: Pupils are equal, round, and reactive to light.  Neck: Normal range of motion. Neck supple.  Cardiovascular: Intact distal pulses.   Respiratory: Effort normal.  Musculoskeletal:  Patient walks with an  impressive right-sided limp.  Internal rotation of the right hip past 0 causes significant pain and blocks at about 10.  External rotation is to 30.  Foot taps are negative.  Skin is intact she is neurovascular intact distally.  Neurological: She is alert and oriented to person, place, and time.  Skin: Skin is warm and dry.  Psychiatric: She has a normal mood and affect. Her behavior is normal. Judgment and thought content normal.    Vital signs in last 24 hours:    Labs:   Estimated body mass index is 27.22 kg/m as calculated from the following:   Height as of 12/24/16: 5' 5.5" (1.664 m).   Weight as of 12/24/16: 75.3 kg (166 lb 1.6 oz).   Imaging Review Plain radiographs demonstrate  AP of the pelvis and crosstable lateral of the left and right hips are taken and reviewed in office today.  Patient's left total hip arthroplasty with Tri-Lock stem appears to be well-placed and well fixed.  Patient's right hip does have some mild arthritis of the medial pole and also a small superior acetabular cyst that is starting to form.  Assessment/Plan:  End stage arthritis, right hip(s)  The patient history, physical examination, clinical judgement of the provider and imaging studies are consistent with end stage degenerative joint disease of the right hip(s) and total hip arthroplasty is deemed medically necessary. The treatment options including medical management, injection therapy, arthroscopy and arthroplasty were discussed at length. The risks and benefits of total hip arthroplasty were presented and reviewed. The risks due to aseptic loosening, infection, stiffness, dislocation/subluxation,  thromboembolic complications and other imponderables were discussed.  The patient acknowledged the explanation, agreed to proceed with the plan and consent was signed. Patient is being admitted for inpatient treatment for surgery, pain control, PT, OT, prophylactic antibiotics, VTE prophylaxis, progressive  ambulation and ADL's and discharge planning.The patient is planning to be discharged home with home health services

## 2017-01-04 MED ORDER — TRANEXAMIC ACID 1000 MG/10ML IV SOLN
1000.0000 mg | INTRAVENOUS | Status: DC
  Filled 2017-01-04: qty 10

## 2017-01-04 MED ORDER — VANCOMYCIN HCL IN DEXTROSE 1-5 GM/200ML-% IV SOLN
1000.0000 mg | INTRAVENOUS | Status: AC
  Administered 2017-01-05: 1000 mg via INTRAVENOUS

## 2017-01-04 MED ORDER — BUPIVACAINE LIPOSOME 1.3 % IJ SUSP
20.0000 mL | Freq: Once | INTRAMUSCULAR | Status: AC
  Administered 2017-01-05: 20 mL
  Filled 2017-01-04: qty 20

## 2017-01-05 ENCOUNTER — Encounter (HOSPITAL_COMMUNITY)
Admission: RE | Disposition: A | Discharge status: Home Health | Payer: Self-pay | Source: Ambulatory Visit | Attending: Orthopedic Surgery

## 2017-01-05 ENCOUNTER — Inpatient Hospital Stay (HOSPITAL_COMMUNITY): Payer: Medicare Other | Admitting: Anesthesiology

## 2017-01-05 ENCOUNTER — Inpatient Hospital Stay (HOSPITAL_COMMUNITY): Payer: Medicare Other

## 2017-01-05 ENCOUNTER — Inpatient Hospital Stay (HOSPITAL_COMMUNITY)
Admission: RE | Admit: 2017-01-05 | Discharge: 2017-01-06 | DRG: 470 | Disposition: A | Discharge status: Home Health | Payer: Medicare Other | Source: Ambulatory Visit | Attending: Orthopedic Surgery | Admitting: Orthopedic Surgery

## 2017-01-05 ENCOUNTER — Encounter (HOSPITAL_COMMUNITY): Payer: Self-pay | Admitting: Anesthesiology

## 2017-01-05 DIAGNOSIS — M1611 Unilateral primary osteoarthritis, right hip: Secondary | ICD-10-CM | POA: Diagnosis present

## 2017-01-05 DIAGNOSIS — I119 Hypertensive heart disease without heart failure: Secondary | ICD-10-CM | POA: Diagnosis present

## 2017-01-05 DIAGNOSIS — Z96642 Presence of left artificial hip joint: Secondary | ICD-10-CM | POA: Diagnosis present

## 2017-01-05 DIAGNOSIS — Z9011 Acquired absence of right breast and nipple: Secondary | ICD-10-CM

## 2017-01-05 DIAGNOSIS — Z853 Personal history of malignant neoplasm of breast: Secondary | ICD-10-CM | POA: Diagnosis not present

## 2017-01-05 DIAGNOSIS — Z87891 Personal history of nicotine dependence: Secondary | ICD-10-CM | POA: Diagnosis not present

## 2017-01-05 DIAGNOSIS — Z880 Allergy status to penicillin: Secondary | ICD-10-CM | POA: Diagnosis not present

## 2017-01-05 DIAGNOSIS — D62 Acute posthemorrhagic anemia: Secondary | ICD-10-CM | POA: Diagnosis not present

## 2017-01-05 DIAGNOSIS — I2510 Atherosclerotic heart disease of native coronary artery without angina pectoris: Secondary | ICD-10-CM | POA: Diagnosis present

## 2017-01-05 DIAGNOSIS — Z886 Allergy status to analgesic agent status: Secondary | ICD-10-CM | POA: Diagnosis not present

## 2017-01-05 DIAGNOSIS — Z90710 Acquired absence of both cervix and uterus: Secondary | ICD-10-CM

## 2017-01-05 DIAGNOSIS — Z471 Aftercare following joint replacement surgery: Secondary | ICD-10-CM | POA: Diagnosis not present

## 2017-01-05 DIAGNOSIS — K219 Gastro-esophageal reflux disease without esophagitis: Secondary | ICD-10-CM | POA: Diagnosis not present

## 2017-01-05 DIAGNOSIS — Z419 Encounter for procedure for purposes other than remedying health state, unspecified: Secondary | ICD-10-CM

## 2017-01-05 DIAGNOSIS — Z888 Allergy status to other drugs, medicaments and biological substances status: Secondary | ICD-10-CM | POA: Diagnosis not present

## 2017-01-05 DIAGNOSIS — Z96641 Presence of right artificial hip joint: Secondary | ICD-10-CM | POA: Diagnosis not present

## 2017-01-05 HISTORY — PX: TOTAL HIP ARTHROPLASTY: SHX124

## 2017-01-05 SURGERY — ARTHROPLASTY, HIP, TOTAL, ANTERIOR APPROACH
Anesthesia: Spinal | Site: Hip | Laterality: Right

## 2017-01-05 MED ORDER — 0.9 % SODIUM CHLORIDE (POUR BTL) OPTIME
TOPICAL | Status: DC | PRN
  Administered 2017-01-05: 1000 mL

## 2017-01-05 MED ORDER — FLEET ENEMA 7-19 GM/118ML RE ENEM: 1.0000 | ENEMA | Freq: Once | RECTAL | Status: DC | PRN

## 2017-01-05 MED ORDER — METHOCARBAMOL 1000 MG/10ML IJ SOLN
500.0000 mg | Freq: Four times a day (QID) | INTRAVENOUS | Status: DC | PRN
  Filled 2017-01-05: qty 5

## 2017-01-05 MED ORDER — DEXAMETHASONE SODIUM PHOSPHATE 10 MG/ML IJ SOLN
10.0000 mg | Freq: Once | INTRAMUSCULAR | Status: AC
  Administered 2017-01-06: 10 mg via INTRAVENOUS
  Filled 2017-01-05: qty 1

## 2017-01-05 MED ORDER — GABAPENTIN 300 MG PO CAPS
300.0000 mg | ORAL_CAPSULE | Freq: Three times a day (TID) | ORAL | Status: DC
  Administered 2017-01-05 – 2017-01-06 (×3): 300 mg via ORAL
  Filled 2017-01-05 (×3): qty 1

## 2017-01-05 MED ORDER — AMLODIPINE BESYLATE 5 MG PO TABS
5.0000 mg | ORAL_TABLET | Freq: Every day | ORAL | Status: DC
  Administered 2017-01-05: 5 mg via ORAL
  Filled 2017-01-05: qty 1

## 2017-01-05 MED ORDER — OXYCODONE HCL 5 MG PO TABS
5.0000 mg | ORAL_TABLET | ORAL | Status: DC | PRN
  Administered 2017-01-05 – 2017-01-06 (×4): 10 mg via ORAL
  Filled 2017-01-05 (×4): qty 2

## 2017-01-05 MED ORDER — RIVAROXABAN 20 MG PO TABS: 20.0000 mg | ORAL_TABLET | Freq: Every day | ORAL | 0 refills | Status: DC

## 2017-01-05 MED ORDER — ACETAMINOPHEN 325 MG PO TABS: 650.0000 mg | ORAL_TABLET | Freq: Four times a day (QID) | ORAL | Status: DC | PRN

## 2017-01-05 MED ORDER — CELECOXIB 200 MG PO CAPS
200.0000 mg | ORAL_CAPSULE | Freq: Two times a day (BID) | ORAL | Status: DC
  Administered 2017-01-05 – 2017-01-06 (×2): 200 mg via ORAL
  Filled 2017-01-05 (×2): qty 1

## 2017-01-05 MED ORDER — CHLORHEXIDINE GLUCONATE 4 % EX LIQD: 60.0000 mL | Freq: Once | CUTANEOUS | Status: DC

## 2017-01-05 MED ORDER — FENTANYL CITRATE (PF) 250 MCG/5ML IJ SOLN
INTRAMUSCULAR | Status: DC | PRN
  Administered 2017-01-05 (×2): 50 ug via INTRAVENOUS
  Administered 2017-01-05: 25 ug via INTRAVENOUS

## 2017-01-05 MED ORDER — DEXTROSE-NACL 5-0.45 % IV SOLN: INTRAVENOUS | Status: DC

## 2017-01-05 MED ORDER — DOCUSATE SODIUM 100 MG PO CAPS
100.0000 mg | ORAL_CAPSULE | Freq: Two times a day (BID) | ORAL | Status: DC
  Administered 2017-01-05 – 2017-01-06 (×2): 100 mg via ORAL
  Filled 2017-01-05 (×2): qty 1

## 2017-01-05 MED ORDER — DIPHENHYDRAMINE HCL 12.5 MG/5ML PO ELIX: 12.5000 mg | ORAL_SOLUTION | ORAL | Status: DC | PRN

## 2017-01-05 MED ORDER — ALUM & MAG HYDROXIDE-SIMETH 200-200-20 MG/5ML PO SUSP: 30.0000 mL | ORAL | Status: DC | PRN

## 2017-01-05 MED ORDER — ONDANSETRON HCL 4 MG PO TABS: 4.0000 mg | ORAL_TABLET | Freq: Four times a day (QID) | ORAL | Status: DC | PRN

## 2017-01-05 MED ORDER — BUPIVACAINE-EPINEPHRINE (PF) 0.5% -1:200000 IJ SOLN
INTRAMUSCULAR | Status: AC
Start: 2017-01-05 — End: 2017-01-05
  Filled 2017-01-05: qty 60

## 2017-01-05 MED ORDER — ONDANSETRON HCL 4 MG/2ML IJ SOLN: 4.0000 mg | Freq: Four times a day (QID) | INTRAMUSCULAR | Status: DC | PRN

## 2017-01-05 MED ORDER — PROMETHAZINE HCL 25 MG/ML IJ SOLN: 6.2500 mg | INTRAMUSCULAR | Status: DC | PRN

## 2017-01-05 MED ORDER — DEXAMETHASONE SODIUM PHOSPHATE 10 MG/ML IJ SOLN
INTRAMUSCULAR | Status: DC | PRN
  Administered 2017-01-05: 5 mg via INTRAVENOUS

## 2017-01-05 MED ORDER — TRANEXAMIC ACID 1000 MG/10ML IV SOLN
INTRAVENOUS | Status: AC | PRN
  Administered 2017-01-05: 2000 mg via TOPICAL

## 2017-01-05 MED ORDER — METHOCARBAMOL 500 MG PO TABS
500.0000 mg | ORAL_TABLET | Freq: Four times a day (QID) | ORAL | Status: DC | PRN
  Administered 2017-01-06: 500 mg via ORAL
  Filled 2017-01-05: qty 1

## 2017-01-05 MED ORDER — BUPIVACAINE-EPINEPHRINE (PF) 0.5% -1:200000 IJ SOLN
INTRAMUSCULAR | Status: DC | PRN
  Administered 2017-01-05: 50 mL via PERINEURAL

## 2017-01-05 MED ORDER — SENNOSIDES-DOCUSATE SODIUM 8.6-50 MG PO TABS
1.0000 | ORAL_TABLET | Freq: Every evening | ORAL | Status: DC | PRN
Start: 2017-01-05 — End: 2017-01-06

## 2017-01-05 MED ORDER — OXYCODONE-ACETAMINOPHEN 5-325 MG PO TABS: 1.0000 | ORAL_TABLET | ORAL | 0 refills | Status: DC | PRN

## 2017-01-05 MED ORDER — METOCLOPRAMIDE HCL 5 MG/ML IJ SOLN: 5.0000 mg | Freq: Three times a day (TID) | INTRAMUSCULAR | Status: DC | PRN

## 2017-01-05 MED ORDER — TRANEXAMIC ACID 1000 MG/10ML IV SOLN
1000.0000 mg | Freq: Once | INTRAVENOUS | Status: AC
  Administered 2017-01-05: 1000 mg via INTRAVENOUS
  Filled 2017-01-05: qty 10

## 2017-01-05 MED ORDER — MENTHOL 3 MG MT LOZG: 1.0000 | LOZENGE | OROMUCOSAL | Status: DC | PRN

## 2017-01-05 MED ORDER — BISACODYL 5 MG PO TBEC: 5.0000 mg | DELAYED_RELEASE_TABLET | Freq: Every day | ORAL | Status: DC | PRN

## 2017-01-05 MED ORDER — PHENYLEPHRINE HCL 10 MG/ML IJ SOLN: INTRAMUSCULAR | Status: DC | PRN

## 2017-01-05 MED ORDER — ACETAMINOPHEN 10 MG/ML IV SOLN: 1000.0000 mg | Freq: Once | INTRAVENOUS | Status: DC | PRN

## 2017-01-05 MED ORDER — TIZANIDINE HCL 2 MG PO TABS: 2.0000 mg | ORAL_TABLET | Freq: Four times a day (QID) | ORAL | 0 refills | Status: DC | PRN

## 2017-01-05 MED ORDER — PHENOL 1.4 % MT LIQD: 1.0000 | OROMUCOSAL | Status: DC | PRN

## 2017-01-05 MED ORDER — KCL IN DEXTROSE-NACL 20-5-0.45 MEQ/L-%-% IV SOLN: INTRAVENOUS | Status: DC

## 2017-01-05 MED ORDER — RIVAROXABAN 10 MG PO TABS
10.0000 mg | ORAL_TABLET | Freq: Every day | ORAL | Status: DC
  Administered 2017-01-06: 10 mg via ORAL
  Filled 2017-01-05: qty 1

## 2017-01-05 MED ORDER — METOCLOPRAMIDE HCL 5 MG PO TABS
5.0000 mg | ORAL_TABLET | Freq: Three times a day (TID) | ORAL | Status: DC | PRN
Start: 2017-01-05 — End: 2017-01-06

## 2017-01-05 MED ORDER — MEPERIDINE HCL 25 MG/ML IJ SOLN: 6.2500 mg | INTRAMUSCULAR | Status: DC | PRN

## 2017-01-05 MED ORDER — PHENYLEPHRINE HCL 10 MG/ML IJ SOLN
INTRAMUSCULAR | Status: DC | PRN
  Administered 2017-01-05: .08 mg via INTRAVENOUS
  Administered 2017-01-05 (×2): .04 mg via INTRAVENOUS
  Administered 2017-01-05 (×2): .08 mg via INTRAVENOUS

## 2017-01-05 MED ORDER — FENTANYL CITRATE (PF) 250 MCG/5ML IJ SOLN
INTRAMUSCULAR | Status: AC
  Filled 2017-01-05: qty 5

## 2017-01-05 MED ORDER — DIPHENHYDRAMINE HCL 50 MG/ML IJ SOLN
INTRAMUSCULAR | Status: DC | PRN
  Administered 2017-01-05: 25 mg via INTRAVENOUS

## 2017-01-05 MED ORDER — HYDROMORPHONE HCL 1 MG/ML IJ SOLN
0.5000 mg | INTRAMUSCULAR | Status: DC | PRN
  Administered 2017-01-05 (×2): 0.5 mg via INTRAVENOUS
  Filled 2017-01-05 (×2): qty 1

## 2017-01-05 MED ORDER — PROPOFOL 500 MG/50ML IV EMUL
INTRAVENOUS | Status: DC | PRN
  Administered 2017-01-05: 50 ug/kg/min via INTRAVENOUS

## 2017-01-05 MED ORDER — ROPINIROLE HCL 0.5 MG PO TABS
0.2500 mg | ORAL_TABLET | Freq: Every day | ORAL | Status: DC
  Administered 2017-01-05: 0.25 mg via ORAL
  Filled 2017-01-05: qty 1

## 2017-01-05 MED ORDER — FENTANYL CITRATE (PF) 100 MCG/2ML IJ SOLN
25.0000 ug | INTRAMUSCULAR | Status: DC | PRN
  Administered 2017-01-05 (×2): 50 ug via INTRAVENOUS

## 2017-01-05 MED ORDER — ACETAMINOPHEN 650 MG RE SUPP: 650.0000 mg | Freq: Four times a day (QID) | RECTAL | Status: DC | PRN

## 2017-01-05 MED ORDER — LACTATED RINGERS IV SOLN
INTRAVENOUS | Status: DC
  Administered 2017-01-05: 09:00:00 via INTRAVENOUS
  Administered 2017-01-05: 50 mL/h via INTRAVENOUS
  Administered 2017-01-05: 13:00:00 via INTRAVENOUS

## 2017-01-05 MED ORDER — FENTANYL CITRATE (PF) 100 MCG/2ML IJ SOLN
INTRAMUSCULAR | Status: AC
Start: 2017-01-05 — End: 2017-01-06
  Filled 2017-01-05: qty 2

## 2017-01-05 SURGICAL SUPPLY — 44 items
BAG DECANTER FOR FLEXI CONT (MISCELLANEOUS) ×3 IMPLANT
BLADE SAW SGTL 18X1.27X75 (BLADE) ×2 IMPLANT
BLADE SAW SGTL 18X1.27X75MM (BLADE) ×1
CAPT HIP TOTAL 2 ×2 IMPLANT
COVER PERINEAL POST (MISCELLANEOUS) ×3 IMPLANT
COVER SURGICAL LIGHT HANDLE (MISCELLANEOUS) ×3 IMPLANT
DRAPE C-ARM 42X72 X-RAY (DRAPES) ×3 IMPLANT
DRAPE STERI IOBAN 125X83 (DRAPES) ×3 IMPLANT
DRAPE U-SHAPE 47X51 STRL (DRAPES) ×6 IMPLANT
DRSG AQUACEL AG ADV 3.5X10 (GAUZE/BANDAGES/DRESSINGS) ×3 IMPLANT
DURAPREP 26ML APPLICATOR (WOUND CARE) ×3 IMPLANT
ELECT BLADE 4.0 EZ CLEAN MEGAD (MISCELLANEOUS) ×3
ELECT REM PT RETURN 9FT ADLT (ELECTROSURGICAL) ×3
ELECTRODE BLDE 4.0 EZ CLN MEGD (MISCELLANEOUS) ×1 IMPLANT
ELECTRODE REM PT RTRN 9FT ADLT (ELECTROSURGICAL) ×1 IMPLANT
FACESHIELD WRAPAROUND (MASK) ×6 IMPLANT
FACESHIELD WRAPAROUND OR TEAM (MASK) ×2 IMPLANT
GLOVE BIO SURGEON STRL SZ7.5 (GLOVE) ×3 IMPLANT
GLOVE BIO SURGEON STRL SZ8.5 (GLOVE) ×3 IMPLANT
GLOVE BIOGEL PI IND STRL 8 (GLOVE) ×1 IMPLANT
GLOVE BIOGEL PI IND STRL 9 (GLOVE) ×1 IMPLANT
GLOVE BIOGEL PI INDICATOR 8 (GLOVE) ×2
GLOVE BIOGEL PI INDICATOR 9 (GLOVE) ×2
GOWN STRL REUS W/ TWL LRG LVL3 (GOWN DISPOSABLE) ×1 IMPLANT
GOWN STRL REUS W/ TWL XL LVL3 (GOWN DISPOSABLE) ×2 IMPLANT
GOWN STRL REUS W/TWL LRG LVL3 (GOWN DISPOSABLE) ×3
GOWN STRL REUS W/TWL XL LVL3 (GOWN DISPOSABLE) ×6
KIT BASIN OR (CUSTOM PROCEDURE TRAY) ×3 IMPLANT
KIT ROOM TURNOVER OR (KITS) ×3 IMPLANT
MANIFOLD NEPTUNE II (INSTRUMENTS) ×3 IMPLANT
NEEDLE HYPO 22GX1.5 SAFETY (NEEDLE) ×6 IMPLANT
NS IRRIG 1000ML POUR BTL (IV SOLUTION) ×3 IMPLANT
PACK TOTAL JOINT (CUSTOM PROCEDURE TRAY) ×3 IMPLANT
PAD ARMBOARD 7.5X6 YLW CONV (MISCELLANEOUS) ×6 IMPLANT
SUT VIC AB 1 CTX 36 (SUTURE) ×3
SUT VIC AB 1 CTX36XBRD ANBCTR (SUTURE) ×1 IMPLANT
SUT VIC AB 2-0 CT1 27 (SUTURE) ×3
SUT VIC AB 2-0 CT1 TAPERPNT 27 (SUTURE) ×1 IMPLANT
SUT VIC AB 3-0 PS2 18 (SUTURE) ×3
SUT VIC AB 3-0 PS2 18XBRD (SUTURE) ×1 IMPLANT
SYR CONTROL 10ML LL (SYRINGE) ×6 IMPLANT
TOWEL OR 17X24 6PK STRL BLUE (TOWEL DISPOSABLE) ×3 IMPLANT
TOWEL OR 17X26 10 PK STRL BLUE (TOWEL DISPOSABLE) ×3 IMPLANT
TRAY CATH 16FR W/PLASTIC CATH (SET/KITS/TRAYS/PACK) IMPLANT

## 2017-01-05 NOTE — Discharge Instructions (Addendum)
INSTRUCTIONS AFTER JOINT REPLACEMENT  ° °o Remove items at home which could result in a fall. This includes throw rugs or furniture in walking pathways °o ICE to the affected joint every three hours while awake for 30 minutes at a time, for at least the first 3-5 days, and then as needed for pain and swelling.  Continue to use ice for pain and swelling. You may notice swelling that will progress down to the foot and ankle.  This is normal after surgery.  Elevate your leg when you are not up walking on it.   °o Continue to use the breathing machine you got in the hospital (incentive spirometer) which will help keep your temperature down.  It is common for your temperature to cycle up and down following surgery, especially at night when you are not up moving around and exerting yourself.  The breathing machine keeps your lungs expanded and your temperature down. ° ° °DIET:  As you were doing prior to hospitalization, we recommend a well-balanced diet. ° °DRESSING / WOUND CARE / SHOWERING ° °Keep the surgical dressing until follow up.  The dressing is water proof, so you can shower without any extra covering.  IF THE DRESSING FALLS OFF or the wound gets wet inside, change the dressing with sterile gauze.  Please use good hand washing techniques before changing the dressing.  Do not use any lotions or creams on the incision until instructed by your surgeon.   ° °ACTIVITY ° °o Increase activity slowly as tolerated, but follow the weight bearing instructions below.   °o No driving for 6 weeks or until further direction given by your physician.  You cannot drive while taking narcotics.  °o No lifting or carrying greater than 10 lbs. until further directed by your surgeon. °o Avoid periods of inactivity such as sitting longer than an hour when not asleep. This helps prevent blood clots.  °o You may return to work once you are authorized by your doctor.  ° ° ° °WEIGHT BEARING  ° °Weight bearing as tolerated with assist  device (walker, cane, etc) as directed, use it as long as suggested by your surgeon or therapist, typically at least 4-6 weeks. ° ° °EXERCISES ° °Results after joint replacement surgery are often greatly improved when you follow the exercise, range of motion and muscle strengthening exercises prescribed by your doctor. Safety measures are also important to protect the joint from further injury. Any time any of these exercises cause you to have increased pain or swelling, decrease what you are doing until you are comfortable again and then slowly increase them. If you have problems or questions, call your caregiver or physical therapist for advice.  ° °Rehabilitation is important following a joint replacement. After just a few days of immobilization, the muscles of the leg can become weakened and shrink (atrophy).  These exercises are designed to build up the tone and strength of the thigh and leg muscles and to improve motion. Often times heat used for twenty to thirty minutes before working out will loosen up your tissues and help with improving the range of motion but do not use heat for the first two weeks following surgery (sometimes heat can increase post-operative swelling).  ° °These exercises can be done on a training (exercise) mat, on the floor, on a table or on a bed. Use whatever works the best and is most comfortable for you.    Use music or television while you are exercising so that   the exercises are a pleasant break in your day. This will make your life better with the exercises acting as a break in your routine that you can look forward to.   Perform all exercises about fifteen times, three times per day or as directed.  You should exercise both the operative leg and the other leg as well. ° °Exercises include: °  °• Quad Sets - Tighten up the muscle on the front of the thigh (Quad) and hold for 5-10 seconds.   °• Straight Leg Raises - With your knee straight (if you were given a brace, keep it on),  lift the leg to 60 degrees, hold for 3 seconds, and slowly lower the leg.  Perform this exercise against resistance later as your leg gets stronger.  °• Leg Slides: Lying on your back, slowly slide your foot toward your buttocks, bending your knee up off the floor (only go as far as is comfortable). Then slowly slide your foot back down until your leg is flat on the floor again.  °• Angel Wings: Lying on your back spread your legs to the side as far apart as you can without causing discomfort.  °• Hamstring Strength:  Lying on your back, push your heel against the floor with your leg straight by tightening up the muscles of your buttocks.  Repeat, but this time bend your knee to a comfortable angle, and push your heel against the floor.  You may put a pillow under the heel to make it more comfortable if necessary.  ° °A rehabilitation program following joint replacement surgery can speed recovery and prevent re-injury in the future due to weakened muscles. Contact your doctor or a physical therapist for more information on knee rehabilitation.  ° ° °CONSTIPATION ° °Constipation is defined medically as fewer than three stools per week and severe constipation as less than one stool per week.  Even if you have a regular bowel pattern at home, your normal regimen is likely to be disrupted due to multiple reasons following surgery.  Combination of anesthesia, postoperative narcotics, change in appetite and fluid intake all can affect your bowels.  ° °YOU MUST use at least one of the following options; they are listed in order of increasing strength to get the job done.  They are all available over the counter, and you may need to use some, POSSIBLY even all of these options:   ° °Drink plenty of fluids (prune juice may be helpful) and high fiber foods °Colace 100 mg by mouth twice a day  °Senokot for constipation as directed and as needed Dulcolax (bisacodyl), take with full glass of water  °Miralax (polyethylene glycol)  once or twice a day as needed. ° °If you have tried all these things and are unable to have a bowel movement in the first 3-4 days after surgery call either your surgeon or your primary doctor.   ° °If you experience loose stools or diarrhea, hold the medications until you stool forms back up.  If your symptoms do not get better within 1 week or if they get worse, check with your doctor.  If you experience "the worst abdominal pain ever" or develop nausea or vomiting, please contact the office immediately for further recommendations for treatment. ° ° °ITCHING:  If you experience itching with your medications, try taking only a single pain pill, or even half a pain pill at a time.  You can also use Benadryl over the counter for itching or also to   help with sleep.   TED HOSE STOCKINGS:  Use stockings on both legs until for at least 2 weeks or as directed by physician office. They may be removed at night for sleeping.  MEDICATIONS:  See your medication summary on the After Visit Summary that nursing will review with you.  You may have some home medications which will be placed on hold until you complete the course of blood thinner medication.  It is important for you to complete the blood thinner medication as prescribed.  PRECAUTIONS:  If you experience chest pain or shortness of breath - call 911 immediately for transfer to the hospital emergency department.   If you develop a fever greater that 101 F, purulent drainage from wound, increased redness or drainage from wound, foul odor from the wound/dressing, or calf pain - CONTACT YOUR SURGEON.                                                   FOLLOW-UP APPOINTMENTS:  If you do not already have a post-op appointment, please call the office for an appointment to be seen by your surgeon.  Guidelines for how soon to be seen are listed in your After Visit Summary, but are typically between 1-4 weeks after surgery.  OTHER INSTRUCTIONS:   Knee  Replacement:  Do not place pillow under knee, focus on keeping the knee straight while resting. CPM instructions: 0-90 degrees, 2 hours in the morning, 2 hours in the afternoon, and 2 hours in the evening. Place foam block, curve side up under heel at all times except when in CPM or when walking.  DO NOT modify, tear, cut, or change the foam block in any way.  MAKE SURE YOU:   Understand these instructions.   Get help right away if you are not doing well or get worse.    Thank you for letting us be a part of your medical care team.  It is a privilege we respect greatly.  We hope these instructions will help you stay on track for a fast and full recovery!   Information on my medicine - XARELTO (Rivaroxaban)  This medication education was reviewed with me or my healthcare representative as part of my discharge preparation.  The pharmacist that spoke with me during my hospital stay was:  Brain Hilts, [facility]  Why was Xarelto prescribed for you? Xarelto was prescribed for you to reduce the risk of blood clots forming after orthopedic surgery. The medical term for these abnormal blood clots is venous thromboembolism (VTE).  What do you need to know about xarelto ? Take your Xarelto ONCE DAILY at the same time every day. You may take it either with or without food.  If you have difficulty swallowing the tablet whole, you may crush it and mix in applesauce just prior to taking your dose.  Take Xarelto exactly as prescribed by your doctor and DO NOT stop taking Xarelto without talking to the doctor who prescribed the medication.  Stopping without other VTE prevention medication to take the place of Xarelto may increase your risk of developing a clot.  After discharge, you should have regular check-up appointments with your healthcare provider that is prescribing your Xarelto.    What do you do if you miss a dose? If you miss a dose, take it as soon as you  remember on the same day  then continue your regularly scheduled once daily regimen the next day. Do not take two doses of Xarelto on the same day.   Important Safety Information A possible side effect of Xarelto is bleeding. You should call your healthcare provider right away if you experience any of the following: ? Bleeding from an injury or your nose that does not stop. ? Unusual colored urine (red or dark brown) or unusual colored stools (red or black). ? Unusual bruising for unknown reasons. ? A serious fall or if you hit your head (even if there is no bleeding).  Some medicines may interact with Xarelto and might increase your risk of bleeding while on Xarelto. To help avoid this, consult your healthcare provider or pharmacist prior to using any new prescription or non-prescription medications, including herbals, vitamins, non-steroidal anti-inflammatory drugs (NSAIDs) and supplements.  This website has more information on Xarelto: [email or web address]

## 2017-01-05 NOTE — Transfer of Care (Signed)
Immediate Anesthesia Transfer of Care Note  Patient: [name]  Procedure(s) Performed: Procedure(s) with comments: TOTAL HIP ARTHROPLASTY ANTERIOR APPROACH (Right) - REQUEST 90 MINS  Patient Location: PACU  Anesthesia Type:Spinal  Level of Consciousness: awake, alert  and oriented  Airway & Oxygen Therapy: Patient Spontanous Breathing and Patient connected to nasal cannula oxygen  Post-op Assessment: Report given to RN and Post -op Vital signs reviewed and stable  Post vital signs: Reviewed and stable  Last Vitals:  Vitals:   01/05/17 0810 01/05/17 1255  BP: (!) 171/88 (!) 116/58  Pulse: 68 (!) 58  Resp: 20 17  Temp: 36.5 C (!) 36.2 C  SpO2: 98% 100%    Last Pain:  Vitals:   01/05/17 0826  TempSrc:   PainSc: 8       Patients Stated Pain Goal: 4 (41/96/22 [id number])  Complications: No apparent anesthesia complications

## 2017-01-05 NOTE — Evaluation (Signed)
Physical Therapy Evaluation Patient Details Name: [name] MRN: [id number] DOB: 17-Apr-1941 Today's Date: 01/05/2017   History of Present Illness  Pt is a 76 y/o female s/p elective R THA, direct anterior approach. PMH includes breast cancer s/p mastectomy, migraines, arthritis, coronary atherosclerosis, s/p L THA, and s/p back surgery.   Clinical Impression  Pt s/p surgery above with deficits below. PTA, pt was independent with ambulation. Upon eval, pt limited by post op pain and weakness, as well as, slightly decreased balance. Required min guard for mobility this session. Reports husband can assist at home as needed. Has all DME and follow up recommendations per MD arrangements. Will continue to follow acutely to maximize functional mobility independence and safety.     Follow Up Recommendations DC plan and follow up therapy as arranged by surgeon;Supervision for mobility/OOB    Equipment Recommendations  None recommended by PT (has all necessary DME )    Recommendations for Other Services       Precautions / Restrictions Precautions Precautions: None Precaution Comments: Reviewed supine ther ex with pt. Limited as pt sitting up in recliner.  Restrictions Weight Bearing Restrictions: Yes RLE Weight Bearing: Weight bearing as tolerated      Mobility  Bed Mobility               General bed mobility comments: Sitting EOB upon entry.   Transfers Overall transfer level: Needs assistance Equipment used: Rolling walker (2 wheeled) Transfers: Sit to/from Stand Sit to Stand: Min guard         General transfer comment: Min guard for safety. Verbal cues for safe hand placement.   Ambulation/Gait Ambulation/Gait assistance: Min guard Ambulation Distance (Feet): 50 Feet Assistive device: Rolling walker (2 wheeled) Gait Pattern/deviations: Step-to pattern;Decreased step length - right;Decreased step length - left;Decreased weight shift to right;Antalgic Gait  velocity: Decreased Gait velocity interpretation: Below normal speed for age/gender General Gait Details: Slow, antalgic gait secondary to post op pain and weakness. Verbal cues for sequencing with use of RW. Pt circumducting RLE secondary to pain and stiffness.   Stairs            Wheelchair Mobility    Modified Rankin (Stroke Patients Only)       Balance Overall balance assessment: Needs assistance Sitting-balance support: No upper extremity supported;Feet supported Sitting balance-Leahy Scale: Good     Standing balance support: No upper extremity supported;Bilateral upper extremity supported;During functional activity Standing balance-Leahy Scale: Fair Standing balance comment: Able to maintain static standing without UE support when performing toileting tasks.                              Pertinent Vitals/Pain Pain Assessment: 0-10 Pain Score: 6  Pain Location: R hip  Pain Descriptors / Indicators: Aching;Sore;Operative site guarding Pain Intervention(s): Limited activity within patient's tolerance;Monitored during session;Premedicated before session    Home Living Family/patient expects to be discharged to:: Private residence Living Arrangements: Spouse/significant other Available Help at Discharge: Family;Available 24 hours/day Type of Home: House Home Access: Stairs to enter Entrance Stairs-Rails: None Entrance Stairs-Number of Steps: 1 Home Layout: One level Home Equipment: Walker - 2 wheels;Bedside commode;Cane - single point;Shower seat      Prior Function Level of Independence: Independent               Hand Dominance   Dominant Hand: Right    Extremity/Trunk Assessment   Upper Extremity Assessment Upper Extremity Assessment: Defer  to OT evaluation    Lower Extremity Assessment Lower Extremity Assessment: RLE deficits/detail RLE Deficits / Details: Sensory in tact. Deficits consistent with post op pain and weakness. Able to  perform ther ex below.     Cervical / Trunk Assessment Cervical / Trunk Assessment: Other exceptions Cervical / Trunk Exceptions: PMH of back surgery.   Communication   Communication: No difficulties  Cognition Arousal/Alertness: Awake/alert Behavior During Therapy: WFL for tasks assessed/performed Overall Cognitive Status: Within Functional Limits for tasks assessed                                        General Comments      Exercises Total Joint Exercises Ankle Circles/Pumps: AROM;Both;20 reps Quad Sets: AROM;Right;10 reps Hip ABduction/ADduction: AROM;Right;10 reps   Assessment/Plan    PT Assessment Patient needs continued PT services  PT Problem List Decreased strength;Decreased range of motion;Decreased balance;Decreased mobility;Decreased knowledge of use of DME;Pain       PT Treatment Interventions DME instruction;Gait training;Stair training;Functional mobility training;Therapeutic activities;Therapeutic exercise;Balance training;Neuromuscular re-education;Patient/family education    PT Goals (Current goals can be found in the Care Plan section)  Acute Rehab PT Goals Patient Stated Goal: to go home  PT Goal Formulation: With patient Time For Goal Achievement: 01/12/17 Potential to Achieve Goals: Good    Frequency 7X/week   Barriers to discharge        Co-evaluation               AM-PAC PT "6 Clicks" Daily Activity  Outcome Measure Difficulty turning over in bed (including adjusting bedclothes, sheets and blankets)?: None Difficulty moving from lying on back to sitting on the side of the bed? : None Difficulty sitting down on and standing up from a chair with arms (e.g., wheelchair, bedside commode, etc,.)?: Unable Help needed moving to and from a bed to chair (including a wheelchair)?: A Little Help needed walking in hospital room?: A Little Help needed climbing 3-5 steps with a railing? : A Little 6 Click Score: 18    End of  Session Equipment Utilized During Treatment: Gait belt Activity Tolerance: Patient tolerated treatment well Patient left: in chair;with call bell/phone within reach Nurse Communication: Mobility status PT Visit Diagnosis: Other abnormalities of gait and mobility (R26.89);Pain Pain - Right/Left: Right Pain - part of body: Hip    Time: 8891-6945 PT Time Calculation (min) (ACUTE ONLY): 21 min   Charges:   PT Evaluation $PT Eval Low Complexity: 1 Low PT Treatments $Gait Training: 8-22 mins   PT G Codes:        Leighton Ruff, PT, DPT  Acute Rehabilitation Services  Pager: [phone]   Rudean Hitt 01/05/2017, 5:34 PM

## 2017-01-05 NOTE — Op Note (Signed)
OPERATIVE REPORT    DATE OF PROCEDURE:  01/05/2017       PREOPERATIVE DIAGNOSIS:  OSTEOARTHRITIS RIGHT HIP                                                          POSTOPERATIVE DIAGNOSIS:  * No post-op diagnosis entered *                                                           PROCEDURE: Anterior R total hip arthroplasty using a 48 mm DePuy Pinnacle  Cup, [employer], 0-degree polyethylene liner, a +5 32 mm ceramic head, a 3 Hi Depuy Triloc stem   SURGEON: Coleta Grosshans J    ASSISTANT:   [employer]  (present throughout entire procedure and necessary for timely completion of the procedure)   ANESTHESIA: Spinal BLOOD LOSS: 300cc FLUID REPLACEMENT: 1500 crystalloid Antibiotic: 2gm ancef Tranexamic Acid: 1gm IV, 2gm Topical COMPLICATIONS: none    INDICATIONS FOR PROCEDURE: A 76 y.o. year-old With  [place]   for 3 years, x-rays show bone-on-bone arthritic changes, and osteophytes. Despite conservative measures with observation, anti-inflammatory medicine, narcotics, use of a cane, has severe unremitting pain and can ambulate only a few blocks before resting. Patient desires elective R total hip arthroplasty to decrease pain and increase function. The risks, benefits, and alternatives were discussed at length including but not limited to the risks of infection, bleeding, nerve injury, stiffness, blood clots, the need for revision surgery, cardiopulmonary complications, among others, and they were willing to proceed. Questions answered     PROCEDURE IN DETAIL: The patient was identified by armband,  received preoperative IV antibiotics in the holding area at [facility], taken to the operating room , appropriate anesthetic monitors  were attached and  anesthesia was induced with the patienton the gurney. The HANA boots were applied to the feet and he was then transferred to the HANA table with a peroneal post and support underneath the  non-operative le, which was locked in 5 lb traction. Theoperative lower extremity was then prepped and draped in the usual sterile fashion from just above the iliac crest to the knee. And a timeout procedure was performed. We then made a 12 cm incision along the interval at the leading edge of the tensor fascia lata of starting at 2 cm lateral to and 2 cm distal to the ASIS. Small bleeders in the skin and subcutaneous tissue identified and cauterized we dissected down to the fascia and made an incision in the fascia allowing [place] to elevate the fascia of the tensor muscle and exploited the interval between the rectus and the tensor fascia lata. A Hohmann retractor was then placed along the superior neck of the femur and a Cobra retractor along the inferior neck of the femur we teed the capsule starting out at the superior anterior aspect of the acetabulum going distally and made the T along the neck both leaflets of the T were tagged with #2 Ethibond suture. Cobra retractors were then placed along the inferior and superior neck allowing [place] to perform a standard  neck cut and removed the femoral head with a power corkscrew. We then placed a right angle Hohmann retractor along the anterior aspect of the acetabulum a spiked Cobra in the cotyloid notch and posteriorly a Muelller retractor. We then sequentially reamed up to a 48 mm basket reamer obtaining good coverage in all quadrants, verified by C-arm imaging. Under C-arm control with and hammered into place a 48 mm Pinnacle cup in 45 of abduction and 15 of anteversion. The cup seated nicely and required no supplemental screws. We then placed a central hole Eliminator and a 0 polyethylene liner. The foot was then externally rotated to 110, the HANA elevator was placed around the flare of the greater trochanter and the limb was extended and abducted delivering the proximal femur up into the wound. A medium Hohmann retractor was placed over the greater trochanter and a  Mueller retractor along the posterior femoral neck completing the exposure. We then performed releases superiorly and and inferiorly of the capsule going back to the pirformis fossa superiorly and to the lesser trochanter inferiorly. We then entered the proximal femur with the box cutting offset chisel followed by, a canal sounder, the chili pepper and broaching up to a 3 Hi broach. This seated nicely and we reamed the calcar. A trial reduction was performed with a +5 mm 32 mm head.The limb lengths were excellent the hip was stable in 90 of external rotation. At this point the trial components removed and we hammered into place a # 3 Hi  Offset Tri-Lock stem with Gryption coating. A + 5 32 mm ceramic ball was then hammered into place the hip was reduced and final C-arm images obtained. The wound was thoroughly irrigated with normal saline solution. We repaired the ant capsule and the tensor fascia lot a with running 0 vicryl suture. the subcutaneous tissue was closed with 2-0 and 3-0 Vicryl suture followed by an Aquacil dressing. At this point the patient was awaken and transferred to hospital gurney without difficulty. The subcutaneous tissue with 0 and 2-0 undyed Vicryl suture and the skin with running  3-0 vicryl subcuticular suture. Aquacil dressing was applied. The patient was then unclamped, rolled supine, awaken extubated and taken to recovery room without difficulty in stable condition.   Frederik Pear J 01/05/2017, 12:11 PM

## 2017-01-05 NOTE — Interval H&P Note (Signed)
History and Physical Interval Note:  01/05/2017 8:42 AM  [name]  has presented today for surgery, with the diagnosis of OSTEOARTHRITIS RIGHT HIP  The various methods of treatment have been discussed with the patient and family. After consideration of risks, benefits and other options for treatment, the patient has consented to  Procedure(s) with comments: [place] (Right) - REQUEST 90 MINS as a surgical intervention .  The patient's history has been reviewed, patient examined, no change in status, stable for surgery.  I have reviewed the patient's chart and labs.  Questions were answered to the patient's satisfaction.     Kerin Salen

## 2017-01-05 NOTE — Anesthesia Postprocedure Evaluation (Signed)
Anesthesia Post Note  Patient: [name]  Procedure(s) Performed: Procedure(s) (LRB): TOTAL HIP ARTHROPLASTY ANTERIOR APPROACH (Right)     Patient location during evaluation: PACU Anesthesia Type: Spinal Level of consciousness: awake Pain management: pain level controlled Vital Signs Assessment: post-procedure vital signs reviewed and stable Respiratory status: spontaneous breathing Cardiovascular status: stable Postop Assessment: no headache, no backache, spinal receding, patient able to bend at knees and no signs of nausea or vomiting Anesthetic complications: no    Last Vitals:  Vitals:   01/05/17 1410 01/05/17 1414  BP: (!) 164/64   Pulse: 61 61  Resp: 10 10  Temp:  (!) 36.1 C  SpO2: 96% 94%    Last Pain:  Vitals:   01/05/17 1406  TempSrc:   PainSc: 10-Worst pain ever   Pain Goal: Patients Stated Pain Goal: 4 (01/05/17 0826)               Debra Colon JR,JOHN Mateo Flow

## 2017-01-05 NOTE — Anesthesia Preprocedure Evaluation (Signed)
Anesthesia Evaluation  Patient identified by MRN, date of birth, ID band Patient awake    Reviewed: Allergy & Precautions, H&P , NPO status , Patient's Chart, lab work & pertinent test results  Airway Mallampati: II  TM Distance: >3 FB Neck ROM: Full    Dental  (+) Dental Advisory Given, Partial Upper   Pulmonary shortness of breath and with exertion, former smoker,    Pulmonary exam normal breath sounds clear to auscultation       Cardiovascular + CAD  Normal cardiovascular exam Rhythm:Regular Rate:Normal  Non obstructive 40% LCx lesion at Cardiac Cath 2008   Neuro/Psych Anxiety Hx/o panic attacksRestless legs syndrome    GI/Hepatic Neg liver ROS, GERD  Medicated and Controlled,  Endo/Other  Hx/o Right breast Ca S/P mastectomy  Renal/GU hematuria Bladder dysfunction  Hematuria    Musculoskeletal  (+) Arthritis , 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 Lab Results  Component Value Date   WBC 9.6 12/24/2016   HGB 11.3 (L) 12/24/2016   HCT 33.5 (L) 12/24/2016   MCV 91.3 12/24/2016   PLT 305 12/24/2016     Chemistry      Component Value Date/Time   NA 139 12/24/2016 0914   K 4.6 12/24/2016 0914   CL 102 12/24/2016 0914   CO2 28 12/24/2016 0914   BUN 21 (H) 12/24/2016 0914   CREATININE 0.94 12/24/2016 0914   CREATININE 0.94 08/03/2013 0833      Component Value Date/Time   CALCIUM 9.7 12/24/2016 0914   ALKPHOS 79 08/25/2014 1255   AST 29 08/25/2014 1255   ALT 22 08/25/2014 1255   BILITOT 0.3 08/25/2014 1255       Anesthesia Physical  Anesthesia Plan  ASA: II  Anesthesia Plan: Spinal   Post-op Pain  Management:    Induction:   PONV Risk Score and Plan: 3 and Ondansetron and Dexamethasone  Airway Management Planned: Natural Airway, Nasal Cannula and Simple Face Mask  Additional Equipment:   Intra-op Plan:   Post-operative Plan:   Informed Consent: I have reviewed the patients History and Physical, chart, labs and discussed the procedure including the risks, benefits and alternatives for the proposed anesthesia with the patient or authorized representative who has indicated his/her understanding and acceptance.     Plan Discussed with: CRNA and Surgeon  Anesthesia Plan Comments:         Anesthesia Quick Evaluation

## 2017-01-05 NOTE — Anesthesia Procedure Notes (Signed)
Spinal  Start time: 01/05/2017 11:00 AM End time: 01/05/2017 11:03 AM Staffing Anesthesiologist: Lyn Hollingshead Performed: anesthesiologist  Spinal Block Patient position: sitting Prep: site prepped and draped and DuraPrep Patient monitoring: heart rate, cardiac monitor, continuous pulse ox and blood pressure Approach: midline Location: L2-3 Injection technique: single-shot Needle Needle type: Pencan  Needle gauge: 24 G Needle length: 10 cm Needle insertion depth: 5 cm Assessment Sensory level: T8

## 2017-01-05 NOTE — Op Note (Deleted)
PATIENT ID:      [name]  MRN:     [id number] DOB/AGE:    1940/06/04 / 76 y.o.       OPERATIVE REPORT    DATE OF PROCEDURE:  01/05/2017       PREOPERATIVE DIAGNOSIS:   OSTEOARTHRITIS RIGHT HIP      Estimated body mass index is 27.22 kg/m as calculated from the following:   Height as of 12/24/16: 5' 5.5" (1.664 m).   Weight as of 12/24/16: 75.3 kg (166 lb 1.6 oz).                                                        POSTOPERATIVE DIAGNOSIS:   * No post-op diagnosis entered *                                                                      PROCEDURE:  Procedure(s): TOTAL HIP ARTHROPLASTY ANTERIOR APPROACH Using DepuyAttune RP implants #6L Femur, #6Tibia, 5 mm Attune RP bearing, 35 Patella     SURGEON: Kenyotta Dorfman J    ASSISTANT:   [employer]   (Present and scrubbed throughout the case, critical for assistance with exposure, retraction, instrumentation, and closure.)         ANESTHESIA: Spinal, 20cc Exparel, 50cc 0.25% Marcaine  EBL: 300  FLUID REPLACEMENT: 1500 crystalloid  TOURNIQUET TIME: 92min  Drains: None  Tranexamic Acid: 1gm IV, 2gm topical  COMPLICATIONS:  None         INDICATIONS FOR PROCEDURE: The patient has  OSTEOARTHRITIS RIGHT HIP, Val deformities, XR shows bone on bone arthritis, lateral subluxation of tibia. Patient has failed all conservative measures including anti-inflammatory medicines, narcotics, attempts at  exercise and weight loss, cortisone injections and viscosupplementation.  Risks and benefits of surgery have been discussed, questions answered.   DESCRIPTION OF PROCEDURE: The patient identified by armband, received  IV antibiotics, in the holding area at [facility]. Patient taken to the operating room, appropriate anesthetic  monitors were attached, and spinal anesthesia was  induced. Tourniquet  applied high to the operative thigh. Lateral post and foot positioner  applied to the table, the lower extremity was then prepped  and draped  in usual sterile fashion from the toes to the tourniquet. Time-out procedure was performed. We began the operation, with the knee flexed 120 degrees, by making the anterior midline incision starting at handbreadth above the patella going over the patella 1 cm medial to and 4 cm distal to the tibial tubercle. Small bleeders in the skin and the  subcutaneous tissue identified and cauterized. Transverse retinaculum was incised and reflected medially and a medial parapatellar arthrotomy was accomplished. the patella was everted and theprepatellar fat pad resected. The superficial medial collateral  ligament was then elevated from anterior to posterior along the proximal  flare of the tibia and anterior half of the menisci resected. The knee was hyperflexed exposing bone on bone arthritis. Peripheral and notch osteophytes as well as the cruciate ligaments were then resected. We continued to  work our way around  posteriorly along the proximal tibia, and externally  rotated the tibia subluxing it out from underneath the femur. A McHale  retractor was placed through the notch and a lateral Hohmann retractor  placed, and we then drilled through the proximal tibia in line with the  axis of the tibia followed by an intramedullary guide rod and 2-degree  posterior slope cutting guide. The tibial cutting guide, 3 degree posterior sloped, was pinned into place allowing resection of 7 mm of bone medially and 0 mm of bone laterally. Satisfied with the tibial resection, we then  entered the distal femur 2 mm anterior to the PCL origin with the  intramedullary guide rod and applied the distal femoral cutting guide  set at 9 mm, with 5 degrees of valgus. This was pinned along the  epicondylar axis. At this point, the distal femoral cut was accomplished without difficulty. We then sized for a #6L femoral component and pinned the guide in 0 degrees of external rotation. The chamfer cutting guide was pinned into  place. The anterior, posterior, and chamfer cuts were accomplished without difficulty followed by  the Attune RP box cutting guide and the box cut. We also removed posterior osteophytes from the posterior femoral condyles. At this  time, the knee was brought into full extension. We checked our  extension and flexion gaps and found them symmetric for a 5 mm bearing. Distracting in extension with a lamina spreader, the posterior horns of the menisci were removed, and Exparel, diluted to 60 cc, with 20cc NS, and 20cc 0.5% Marcaine,was injected into the capsule and synovium of the knee. The posterior patella cut was accomplished with the 9.5 mm Attune cutting guide, sized for a 49mm dome, and the fixation pegs drilled.The knee  was then once again hyperflexed exposing the proximal tibia. We sized for a # 6 tibial base plate, applied the smokestack and the conical reamer followed by the the Delta fin keel punch. We then hammered into place the Attune RP trial femoral component, drilled the lugs, inserted a  5 mm trial bearing, trial patellar button, and took the knee through range of motion from 0-130 degrees. No thumb pressure was required for patellar Tracking. At this point, the limb was wrapped with an Esmarch bandage and the tourniquet inflated to 350 mmHg. All trial components were removed, mating surfaces irrigated with pulse lavage, and dried with suction and sponges. 10 cc of the Exparel solution was applied to the cancellus bone of the patella distal femur and proximal tibia.  After waiting 1 minute, the bony surfaces were again, dried with sponges. A double batch of DePuy HV cement with 1500 mg of Zinacef was mixed and applied to all bony metallic mating surfaces except for the posterior condyles of the femur itself. In order, we hammered into place the tibial tray and removed excess cement, the femoral component and removed excess cement. The final Attune RP bearing  was inserted, and the knee brought  to full extension with compression.  The patellar button was clamped into place, and excess cement  removed. While the cement cured the wound was irrigated out with normal saline solution pulse lavage. Ligament stability and patellar tracking were checked and found to be excellent. The parapatellar arthrotomy was closed with  running #1 Vicryl suture. The subcutaneous tissue with 0 and 2-0 undyed  Vicryl suture, and the skin with running 3-0 SQ vicryl. A dressing of Xeroform,  4 x 4, dressing sponges, Webril, and Ace wrap applied. The  patient  awakened, and taken to recovery room without difficulty.   Alsace Dowd J 01/05/2017, 8:43 AM

## 2017-01-06 ENCOUNTER — Encounter (HOSPITAL_COMMUNITY): Payer: Self-pay | Admitting: Orthopedic Surgery

## 2017-01-06 LAB — CBC
HCT: 27.4 % — ABNORMAL LOW (ref 36.0–46.0)
Hemoglobin: 9 g/dL — ABNORMAL LOW (ref 12.0–15.0)
MCH: 30.1 pg (ref 26.0–34.0)
MCHC: 32.8 g/dL (ref 30.0–36.0)
MCV: 91.6 fL (ref 78.0–100.0)
Platelets: 276 10*3/uL (ref 150–400)
RBC: 2.99 MIL/uL — ABNORMAL LOW (ref 3.87–5.11)
RDW: 13.2 % (ref 11.5–15.5)
WBC: 12.1 10*3/uL — ABNORMAL HIGH (ref 4.0–10.5)

## 2017-01-06 NOTE — Progress Notes (Signed)
Pt discharge instructions reviewed with pt. Pt verbalizes understanding. Pt belongings with pt. Pt is not in distress. Pt discharged via wheelchair.  

## 2017-01-06 NOTE — Care Management Note (Signed)
Case Management Note  Patient Details  Name: [name] MRN: [id number] Date of Birth: 1940-10-13  Subjective/Objective:                 Patient from home admitted with R hip sx for osteoarthritis. Will DC to home today w [facility] for [facility] PT OT SW, referral placed to [facility] clinical liaison. Patient states she has all DME at home already.    Action/Plan:  DC to home w [place].  Expected Discharge Date:  01/06/17               Expected Discharge Plan:  [place]  In-House Referral:     Discharge planning Services  CM Consult  Post Acute Care Choice:  Home Health Choice offered to:     DME Arranged:    DME Agency:     HH Arranged:  PT, OT, Social Work [employer] Agency:  [place]  Status of Service:  Completed, signed off  If discussed at [employer], dates discussed:    Additional Comments:  Carles Collet, RN 01/06/2017, 12:19 PM

## 2017-01-06 NOTE — Progress Notes (Signed)
Physical Therapy Treatment Patient Details Name: [name] MRN: [id number] DOB: [birth year] Today's Date: 01/06/2017    History of Present Illness Pt is a 76 y/o female s/p elective R THA, direct anterior approach. PMH includes breast cancer s/p mastectomy, migraines, arthritis, coronary atherosclerosis, s/p L THA, and s/p back surgery.     PT Comments    Pt performed stair training to simulate entry into home this afternoon.  Handout issued on technique with stair training.  PTA reviewed HEP and educated on frequency and technique during session.  Informed nursing patient is safe to d/c home from a mobility standpoint. .  Follow Up Recommendations  DC plan and follow up therapy as arranged by surgeon;Supervision for mobility/OOB     Equipment Recommendations  None recommended by PT (has all DME from previous surgery.  )    Recommendations for Other Services       Precautions / Restrictions Precautions Precautions: None Precaution Comments: Reviewed supine ther ex with pt. Limited as pt sitting up in recliner.  Restrictions Weight Bearing Restrictions: Yes RLE Weight Bearing: Weight bearing as tolerated    Mobility  Bed Mobility               General bed mobility comments: Sitting in recliner upon arrival.    Transfers Overall transfer level: Needs assistance Equipment used: Rolling walker (2 wheeled) Transfers: Sit to/from Stand Sit to Stand: Supervision         General transfer comment: Cues for hand placement and controlled descent.    Ambulation/Gait Ambulation/Gait assistance: Supervision Ambulation Distance (Feet): 250 Feet Assistive device: Rolling walker (2 wheeled) Gait Pattern/deviations: Step-through pattern;Narrow base of support;Antalgic;Decreased stride length Gait velocity: Decreased Gait velocity interpretation: Below normal speed for age/gender General Gait Details: Cues for upper trunk control and gait symmetry.  Pt educated on RW  use/safety.     Stairs Stairs: Yes   Stair Management: No rails;Backwards;Forwards;With walker Number of Stairs: 6 General stair comments: Cues for sequencing and RW placement to negotiate stairs.  Pt performed x2 stairs for 3 trials to ensure sequencing is understood.  Pt issued handout for sharing information with her family.  Min assistance to stabilize RW.    Wheelchair Mobility    Modified Rankin (Stroke Patients Only)       Balance Overall balance assessment: Needs assistance   Sitting balance-Leahy Scale: Normal       Standing balance-Leahy Scale: Fair                              Cognition Arousal/Alertness: Awake/alert Behavior During Therapy: WFL for tasks assessed/performed Overall Cognitive Status: Within Functional Limits for tasks assessed                                        Exercises Total Joint Exercises Ankle Circles/Pumps: AROM;Both;20 reps Quad Sets: AROM;Right;10 reps Short Arc Quad: AROM;Right;10 reps;Supine Heel Slides: AROM;Right;10 reps;Supine Hip ABduction/ADduction: AROM;Right;20 reps;Supine;Standing (1x10 supine and 1x10 in standing.  ) Long Arc Quad: AROM;Right;10 reps;Seated Knee Flexion: AROM;Right;10 reps;Standing Marching in Standing: AROM;Right;10 reps;Standing Standing Hip Extension: AROM;Right;10 reps;Standing    General Comments        Pertinent Vitals/Pain      Home Living                      Prior Function  PT Goals (current goals can now be found in the care plan section) Acute Rehab PT Goals Patient Stated Goal: to go home  Potential to Achieve Goals: Good Progress towards PT goals: Progressing toward goals    Frequency    7X/week      PT Plan Current plan remains appropriate    Co-evaluation              AM-PAC PT "6 Clicks" Daily Activity  Outcome Measure  Difficulty turning over in bed (including adjusting bedclothes, sheets and  blankets)?: None Difficulty moving from lying on back to sitting on the side of the bed? : None Difficulty sitting down on and standing up from a chair with arms (e.g., wheelchair, bedside commode, etc,.)?: A Little Help needed moving to and from a bed to chair (including a wheelchair)?: A Little Help needed walking in hospital room?: A Little Help needed climbing 3-5 steps with a railing? : A Little 6 Click Score: 20    End of Session Equipment Utilized During Treatment: Gait belt Activity Tolerance: Patient tolerated treatment well Patient left: in chair;with call bell/phone within reach Nurse Communication: Mobility status (informed nursing patient is safe to d/c home today.  ) PT Visit Diagnosis: Other abnormalities of gait and mobility (R26.89);Pain Pain - Right/Left: Right Pain - part of body: Hip     Time: 1093-2355 PT Time Calculation (min) (ACUTE ONLY): 31 min  Charges:  $Gait Training: 8-22 mins $Therapeutic Exercise: 8-22 mins                    G Codes:       Governor Rooks, PTA pager [phone]    Cristela Blue 01/06/2017, 11:12 AM

## 2017-01-06 NOTE — Progress Notes (Signed)
Physical Therapy Treatment Patient Details Name: [name] MRN: [id number] DOB: February 17, 1941 Today's Date: 01/06/2017    History of Present Illness Pt is a 76 y/o female s/p elective R THA, direct anterior approach. PMH includes breast cancer s/p mastectomy, migraines, arthritis, coronary atherosclerosis, s/p L THA, and s/p back surgery.     PT Comments    Pt seen for second session before d/c home at request of physician.  Pt performing well and able to teach back stair training to therapist.  Pt performing mobility well and is safe to d/c home with continued HHPT to improve function and strength.  PTA reviewed HEP again for technique and frequency.  Pt awaiting sister to arrive for d/c home.    Follow Up Recommendations  DC plan and follow up therapy as arranged by surgeon;Supervision for mobility/OOB     Equipment Recommendations  None recommended by PT (has all DME from previous surgery.  )    Recommendations for Other Services       Precautions / Restrictions Precautions Precautions: None Precaution Comments: Reviewed supine ther ex with pt. Limited as pt sitting up in recliner.  Restrictions Weight Bearing Restrictions: Yes RLE Weight Bearing: Weight bearing as tolerated    Mobility  Bed Mobility               General bed mobility comments: Sitting in recliner upon arrival.    Transfers Overall transfer level: Needs assistance Equipment used: Rolling walker (2 wheeled) Transfers: Sit to/from Stand Sit to Stand: Supervision         General transfer comment: Cues for hand placement and controlled descent.  Pt remains holding to the RW and educated on safety to avoid this at home and reach back for seated surface.    Ambulation/Gait Ambulation/Gait assistance: Supervision Ambulation Distance (Feet): 700 Feet Assistive device: Rolling walker (2 wheeled) Gait Pattern/deviations: Step-through pattern;Trunk flexed Gait velocity: Decreased Gait velocity  interpretation: Below normal speed for age/gender General Gait Details: Cues for upper trunk control and RW safety.  Increased gait speed observed.     Stairs Stairs: Yes   Stair Management: No rails;Backwards;Forwards;With walker Number of Stairs: 4 General stair comments: Pt able to teach back sequencing but requires min assist to stabilize RW to ascend second stair.    Wheelchair Mobility    Modified Rankin (Stroke Patients Only)       Balance Overall balance assessment: Needs assistance Sitting-balance support: No upper extremity supported;Feet supported Sitting balance-Leahy Scale: Normal     Standing balance support: No upper extremity supported;Bilateral upper extremity supported;During functional activity Standing balance-Leahy Scale: Good                              Cognition Arousal/Alertness: Awake/alert Behavior During Therapy: WFL for tasks assessed/performed Overall Cognitive Status: Within Functional Limits for tasks assessed                                        Exercises Total Joint Exercises Ankle Circles/Pumps: AROM;Both;20 reps Quad Sets: AROM;Right;10 reps Short Arc Quad: AROM;Right;10 reps;Supine Heel Slides: AROM;Right;10 reps;Supine Hip ABduction/ADduction: AROM;Right;20 reps;Supine;Standing (1x10 in standing and 1x10 in supine) Long Arc Quad: AROM;Right;10 reps;Seated Knee Flexion: AROM;Right;10 reps;Standing Marching in Standing: AROM;Right;10 reps;Standing Standing Hip Extension: AROM;Right;10 reps;Standing    General Comments        Pertinent Vitals/Pain Pain  Assessment: 0-10 Pain Score: 4  Pain Location: R hip  Pain Descriptors / Indicators: Aching;Sore;Operative site guarding Pain Intervention(s): Monitored during session;Repositioned    Home Living                      Prior Function            PT Goals (current goals can now be found in the care plan section) Acute Rehab PT  Goals Patient Stated Goal: to go home  Potential to Achieve Goals: Good Progress towards PT goals: Progressing toward goals    Frequency    7X/week      PT Plan Current plan remains appropriate    Co-evaluation              AM-PAC PT "6 Clicks" Daily Activity  Outcome Measure  Difficulty turning over in bed (including adjusting bedclothes, sheets and blankets)?: None Difficulty moving from lying on back to sitting on the side of the bed? : None Difficulty sitting down on and standing up from a chair with arms (e.g., wheelchair, bedside commode, etc,.)?: None Help needed moving to and from a bed to chair (including a wheelchair)?: A Little Help needed walking in hospital room?: A Little Help needed climbing 3-5 steps with a railing? : A Little 6 Click Score: 21    End of Session Equipment Utilized During Treatment: Gait belt Activity Tolerance: Patient tolerated treatment well Patient left: in chair;with call bell/phone within reach Nurse Communication: Mobility status (informed nursing patient is completed with 2nd session.  ) PT Visit Diagnosis: Other abnormalities of gait and mobility (R26.89);Pain Pain - Right/Left: Right Pain - part of body: Hip     Time: 5456-2563 PT Time Calculation (min) (ACUTE ONLY): 17 min  Charges:  $Gait Training: 8-22 mins                    G Codes:       [name], PTA pager [phone]    [name] 01/06/2017, 2:29 PM

## 2017-01-06 NOTE — Progress Notes (Signed)
PATIENT ID: [name]  MRN: [id number]  DOB/AGE:  1940-12-30 / 76 y.o.  1 Day Post-Op Procedure(s) (LRB): TOTAL HIP ARTHROPLASTY ANTERIOR APPROACH (Right)    PROGRESS NOTE Subjective: Patient is alert, oriented, No Nausea, No Vomiting, yes passing gas, . Taking PO Well. Denies SOB, Chest or Calf Pain. Using Incentive Spirometer, PAS in place. Ambulate Walked in room easily Patient reports pain as  2/10  .    Objective: Vital signs in last 24 hours: Vitals:   01/05/17 1424 01/05/17 1500 01/05/17 2230 01/06/17 0700  BP:  (!) 167/57 (!) 129/48 (!) 163/54  Pulse: 64 61 72 66  Resp: 12 16 16 17   Temp: (!) 97 F (36.1 C)  97.9 F (36.6 C) 97.9 F (36.6 C)  TempSrc:   Oral Oral  SpO2: 100% 97% 97% 98%      Intake/Output from previous day: I/O last 3 completed shifts: In: 900 [I.V.:900] Out: 450 [Urine:200; Blood:250]   Intake/Output this shift: No intake/output data recorded.   LABORATORY DATA:  Recent Labs  01/06/17 0548  WBC 12.1*  HGB 9.0*  HCT 27.4*  PLT 276    Examination: Neurologically intact ABD soft Neurovascular intact Sensation intact distally Intact pulses distally Dorsiflexion/Plantar flexion intact Incision: dressing C/D/I No cellulitis present Compartment soft could do straight leg raise and room easily} XR AP&Lat of hip shows well placed\fixed THA  Assessment:   1 Day Post-Op Procedure(s) (LRB): TOTAL HIP ARTHROPLASTY ANTERIOR APPROACH (Right) ADDITIONAL DIAGNOSIS:  Expected Acute Blood Loss Anemia, Asthma, history of anemia, history of breast cancer, history of constipation  Plan: PT/OT WBAT, THA  DVT Prophylaxis: SCDx72 hrs, ASA 325 mg BID x 2 weeks  DISCHARGE PLAN: Home,Patient would like to go home today if she can pass physical therapy  DISCHARGE NEEDS: HHPT, Walker and 3-in-1 comode seatPatient ID: [name], female   DOB: Jun 27, 1940, 76 y.o.   MRN: [id number]

## 2017-01-06 NOTE — Discharge Summary (Signed)
Patient ID: [name] MRN: [id number] DOB/AGE: [birth year] 77 y.o.  Admit date: 01/05/2017 Discharge date: 01/06/2017  Admission Diagnoses:  Principal Problem:   Osteoarthritis of right hip Active Problems:   Primary osteoarthritis of right hip   Discharge Diagnoses:  Same  Past Medical History:  Diagnosis Date  . Anemia    "little bit"  . Arthritis    back  . Breast cancer ([place])    Right mastectomy  . Bruises easily   . Chronic back pain   . Coronary atherosclerosis of native coronary artery    Nonobstructive at catheterization 2008 - 40% circumflex  . Cystocele 02/20/2015  . Diverticula of colon   . Environmental allergies   . Fitting and adjustment of pessary 05/29/2015   Inserted milex ring with support #2   . GERD (gastroesophageal reflux disease)    doesn't take any meds from this.  Takes tums as neeeded.  . Hematuria 02/20/2015  . History of bronchitis    >29yrs ago  . History of colon polyps   . History of diverticulosis 05/04/2015  . History of migraine    last time a few months ago  . Insomnia    doesn't take any meds  . Kidney infection    history of  . Nocturia   . Panic attacks    no meds required  . Pelvic pain in female 02/20/2015  . 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 Rectal pain, chronic 05/18/2015  . Seasonal allergies   . Shortness of breath    with exertion  . Urinary frequency   . Urinary urgency     Surgeries: Procedure(s): TOTAL HIP ARTHROPLASTY ANTERIOR APPROACH on 01/05/2017   Consultants:   Discharged Condition: Improved  Hospital Course: MASIYA CLAASSEN is an 76 y.o. female who was admitted 01/05/2017 for operative treatment ofOsteoarthritis of right hip. Patient has severe unremitting pain that affects sleep, daily activities, and work/hobbies. After pre-op clearance the patient was taken to the operating room on 01/05/2017 and underwent  Procedure(s): TOTAL HIP ARTHROPLASTY ANTERIOR APPROACH.     Patient was given perioperative antibiotics: Anti-infectives    Start     Dose/Rate Route Frequency Ordered Stop   01/05/17 1000  vancomycin (VANCOCIN) IVPB 1000 mg/200 mL premix     1,000 mg 200 mL/hr over 60 Minutes Intravenous On call to O.R. 01/04/17 1057 01/05/17 1149       Patient was given sequential compression devices, early ambulation, and chemoprophylaxis to prevent DVT.  Patient benefited maximally from hospital stay and there were no complications.    Recent vital signs: Patient Vitals for the past 24 hrs:  BP Temp Temp src Pulse Resp SpO2  01/06/17 0700 (!) 163/54 97.9 F (36.6 C) Oral 66 17 98 %  01/05/17 2230 (!) 129/48 97.9 F (36.6 C) Oral 72 16 97 %  01/05/17 1500 (!) 167/57 - - 61 16 97 %  01/05/17 1424 - (!) 97 F (36.1 C) - 64 12 100 %  01/05/17 1414 - (!) 97 F (36.1 C) - 61 10 94 %  01/05/17 1410 (!) 164/64 - - 61 10 96 %  01/05/17 1406 - - - 66 16 99 %  01/05/17 1400 - - - 60 18 98 %  01/05/17 1355 (!) 146/92 - - 62 15 93 %  01/05/17 1345 - - - (!) 54 10 95 %  01/05/17 1340 (!) 153/58 - - (!) 54 18 100 %  01/05/17 1330 - - - Marland Kitchen)  55 18 100 %  01/05/17 1328 (!) 148/71 - - 62 12 100 %  01/05/17 1310 (!) 137/94 - - (!) 54 14 100 %  01/05/17 1255 (!) 116/58 (!) 97.2 F (36.2 C) - (!) 58 17 100 %     Recent laboratory studies:  Recent Labs  01/06/17 0548  WBC 12.1*  HGB 9.0*  HCT 27.4*  PLT 276     Discharge Medications:   Allergies as of 01/06/2017      Reactions   Penicillins Anaphylaxis, Swelling, Other (See Comments)   Blisters Has patient had a PCN reaction causing immediate rash, facial/tongue/throat swelling, SOB or lightheadedness with hypotension: Yes Has patient had a PCN reaction causing severe rash involving mucus membranes or skin necrosis: No Has patient had a PCN reaction that required hospitalization No Has patient had a PCN reaction occurring within the last 10 years: No If all of the above answers are "NO", then may  proceed with Cephalosporin use.   Apixaban Hives   Aspirin Other (See Comments)   Causes hemoptysis      Medication List    STOP taking these medications   apixaban 2.5 MG Tabs tablet Commonly known as:  ELIQUIS   famotidine 20 MG tablet Commonly known as:  PEPCID   HYDROcodone-acetaminophen 10-325 MG tablet Commonly known as:  NORCO   predniSONE 10 MG tablet Commonly known as:  DELTASONE     TAKE these medications   amLODipine 5 MG tablet Commonly known as:  NORVASC Take 5 mg by mouth at bedtime.   ENBREL SURECLICK 50 MG/ML injection Generic drug:  etanercept Inject 50 mg into the skin once a week. On Fridays   oxyCODONE-acetaminophen 5-325 MG tablet Commonly known as:  ROXICET Take 1 tablet by mouth every 4 (four) hours as needed.   polyethylene glycol powder powder Commonly known as:  GLYCOLAX/MIRALAX Take 17 g daily in fluid   rivaroxaban 20 MG Tabs tablet Commonly known as:  XARELTO Take 1 tablet (20 mg total) by mouth daily with supper.   rOPINIRole 0.25 MG tablet Commonly known as:  REQUIP TAKE 2 AT BEDTIME CAN TAKE UP TO 3 AT BEDTIME   tiZANidine 2 MG tablet Commonly known as:  ZANAFLEX Take 1 tablet (2 mg total) by mouth every 6 (six) hours as needed for muscle spasms.            Durable Medical Equipment        Start     Ordered   01/05/17 1444  DME Walker rolling  Once    Question:  Patient needs a walker to treat with the following condition  Answer:  Status post right hip replacement   01/05/17 1443   01/05/17 1444  DME 3 n 1  Once     01/05/17 1443   01/05/17 1444  DME Bedside commode  Once    Question:  Patient needs a bedside commode to treat with the following condition  Answer:  Status post right hip replacement   01/05/17 1443       Discharge Care Instructions        Start     Ordered   01/06/17 0000  Call MD / Call 911    Comments:  If you experience chest pain or shortness of breath, CALL 911 and be transported to the  hospital emergency room.  If you develope a fever above 101 F, pus (white drainage) or increased drainage or redness at the wound, or calf pain, call  your surgeon's office.   01/06/17 1209   01/06/17 0000  Diet - low sodium heart healthy     01/06/17 1209   01/06/17 0000  Constipation Prevention    Comments:  Drink plenty of fluids.  Prune juice may be helpful.  You may use a stool softener, such as Colace (over the counter) 100 mg twice a day.  Use MiraLax (over the counter) for constipation as needed.   01/06/17 1209   01/06/17 0000  Increase activity slowly as tolerated     01/06/17 1209   01/06/17 0000  Patient may shower    Comments:  You may shower without a dressing once there is no drainage.  Do not wash over the wound.  If drainage remains, cover wound with plastic wrap and then shower.   01/06/17 1209   01/06/17 0000  Driving restrictions    Comments:  No driving for 2 weeks   01/06/17 1209   01/06/17 0000  Follow the hip precautions as taught in Physical Therapy     01/06/17 1209   01/05/17 0000  tiZANidine (ZANAFLEX) 2 MG tablet  Every 6 hours PRN     01/05/17 1256   01/05/17 0000  oxyCODONE-acetaminophen (ROXICET) 5-325 MG tablet  Every 4 hours PRN     01/05/17 1256   01/05/17 0000  rivaroxaban (XARELTO) 20 MG TABS tablet  Daily with supper     01/05/17 1256      Diagnostic Studies: Dg C-arm 1-60 Min  Result Date: 01/05/2017 CLINICAL DATA:  Right total hip arthroplasty. EXAM: OPERATIVE right HIP (WITH PELVIS IF PERFORMED) 3 VIEWS TECHNIQUE: Fluoroscopic spot image(s) were submitted for interpretation post-operatively. COMPARISON:  02/29/2016. FINDINGS: Three intraoperative fluoroscopic spot views show placement of a right total hip arthroplasty. Femoral stem appears well seated. Previously placed left total hip arthroplasty is incidentally noted. IMPRESSION: Intraoperative visualization for right total hip arthroplasty. Electronically Signed   By: Lorin Picket M.D.   On:  01/05/2017 12:23   Dg Hip Operative Unilat W Or W/o Pelvis Right  Result Date: 01/05/2017 CLINICAL DATA:  Right total hip arthroplasty. EXAM: OPERATIVE right HIP (WITH PELVIS IF PERFORMED) 3 VIEWS TECHNIQUE: Fluoroscopic spot image(s) were submitted for interpretation post-operatively. COMPARISON:  02/29/2016. FINDINGS: Three intraoperative fluoroscopic spot views show placement of a right total hip arthroplasty. Femoral stem appears well seated. Previously placed left total hip arthroplasty is incidentally noted. IMPRESSION: Intraoperative visualization for right total hip arthroplasty. Electronically Signed   By: Lorin Picket M.D.   On: 01/05/2017 12:23    Disposition: 01-Home or Self Care  Discharge Instructions    Call MD / Call 911    Complete by:  As directed    If you experience chest pain or shortness of breath, CALL 911 and be transported to the hospital emergency room.  If you develope a fever above 101 F, pus (white drainage) or increased drainage or redness at the wound, or calf pain, call your surgeon's office.   Constipation Prevention    Complete by:  As directed    Drink plenty of fluids.  Prune juice may be helpful.  You may use a stool softener, such as Colace (over the counter) 100 mg twice a day.  Use MiraLax (over the counter) for constipation as needed.   Diet - low sodium heart healthy    Complete by:  As directed    Driving restrictions    Complete by:  As directed    No driving for 2 weeks  Follow the hip precautions as taught in Physical Therapy    Complete by:  As directed    Increase activity slowly as tolerated    Complete by:  As directed    Patient may shower    Complete by:  As directed    You may shower without a dressing once there is no drainage.  Do not wash over the wound.  If drainage remains, cover wound with plastic wrap and then shower.      Follow-up Information    Frederik Pear, MD Follow up in 2 week(s).   Specialty:  Orthopedic  Surgery Contact information: [place] [address] [phone]            Signed: Hardin Negus Aviya Jarvie R 01/06/2017, 12:09 PM

## 2017-01-07 DIAGNOSIS — Z471 Aftercare following joint replacement surgery: Secondary | ICD-10-CM | POA: Diagnosis not present

## 2017-01-07 DIAGNOSIS — R350 Frequency of micturition: Secondary | ICD-10-CM | POA: Diagnosis not present

## 2017-01-07 DIAGNOSIS — Z96641 Presence of right artificial hip joint: Secondary | ICD-10-CM | POA: Diagnosis not present

## 2017-01-07 DIAGNOSIS — Z853 Personal history of malignant neoplasm of breast: Secondary | ICD-10-CM | POA: Diagnosis not present

## 2017-01-07 DIAGNOSIS — I2510 Atherosclerotic heart disease of native coronary artery without angina pectoris: Secondary | ICD-10-CM | POA: Diagnosis not present

## 2017-01-07 DIAGNOSIS — K219 Gastro-esophageal reflux disease without esophagitis: Secondary | ICD-10-CM | POA: Diagnosis not present

## 2017-01-07 DIAGNOSIS — M549 Dorsalgia, unspecified: Secondary | ICD-10-CM | POA: Diagnosis not present

## 2017-01-07 DIAGNOSIS — Z7901 Long term (current) use of anticoagulants: Secondary | ICD-10-CM | POA: Diagnosis not present

## 2017-01-07 DIAGNOSIS — D649 Anemia, unspecified: Secondary | ICD-10-CM | POA: Diagnosis not present

## 2017-01-07 DIAGNOSIS — Z79891 Long term (current) use of opiate analgesic: Secondary | ICD-10-CM | POA: Diagnosis not present

## 2017-01-11 DIAGNOSIS — Z96641 Presence of right artificial hip joint: Secondary | ICD-10-CM | POA: Diagnosis not present

## 2017-01-11 DIAGNOSIS — Z853 Personal history of malignant neoplasm of breast: Secondary | ICD-10-CM | POA: Diagnosis not present

## 2017-01-11 DIAGNOSIS — Z79891 Long term (current) use of opiate analgesic: Secondary | ICD-10-CM | POA: Diagnosis not present

## 2017-01-11 DIAGNOSIS — Z7901 Long term (current) use of anticoagulants: Secondary | ICD-10-CM | POA: Diagnosis not present

## 2017-01-11 DIAGNOSIS — M549 Dorsalgia, unspecified: Secondary | ICD-10-CM | POA: Diagnosis not present

## 2017-01-11 DIAGNOSIS — I2510 Atherosclerotic heart disease of native coronary artery without angina pectoris: Secondary | ICD-10-CM | POA: Diagnosis not present

## 2017-01-11 DIAGNOSIS — Z471 Aftercare following joint replacement surgery: Secondary | ICD-10-CM | POA: Diagnosis not present

## 2017-01-11 DIAGNOSIS — K219 Gastro-esophageal reflux disease without esophagitis: Secondary | ICD-10-CM | POA: Diagnosis not present

## 2017-01-11 DIAGNOSIS — R350 Frequency of micturition: Secondary | ICD-10-CM | POA: Diagnosis not present

## 2017-01-11 DIAGNOSIS — D649 Anemia, unspecified: Secondary | ICD-10-CM | POA: Diagnosis not present

## 2017-01-12 DIAGNOSIS — S51812A Laceration without foreign body of left forearm, initial encounter: Secondary | ICD-10-CM | POA: Diagnosis not present

## 2017-01-12 DIAGNOSIS — R300 Dysuria: Secondary | ICD-10-CM | POA: Diagnosis not present

## 2017-01-14 DIAGNOSIS — Z79891 Long term (current) use of opiate analgesic: Secondary | ICD-10-CM | POA: Diagnosis not present

## 2017-01-14 DIAGNOSIS — Z853 Personal history of malignant neoplasm of breast: Secondary | ICD-10-CM | POA: Diagnosis not present

## 2017-01-14 DIAGNOSIS — M549 Dorsalgia, unspecified: Secondary | ICD-10-CM | POA: Diagnosis not present

## 2017-01-14 DIAGNOSIS — D649 Anemia, unspecified: Secondary | ICD-10-CM | POA: Diagnosis not present

## 2017-01-14 DIAGNOSIS — R350 Frequency of micturition: Secondary | ICD-10-CM | POA: Diagnosis not present

## 2017-01-14 DIAGNOSIS — Z7901 Long term (current) use of anticoagulants: Secondary | ICD-10-CM | POA: Diagnosis not present

## 2017-01-14 DIAGNOSIS — Z96641 Presence of right artificial hip joint: Secondary | ICD-10-CM | POA: Diagnosis not present

## 2017-01-14 DIAGNOSIS — I2510 Atherosclerotic heart disease of native coronary artery without angina pectoris: Secondary | ICD-10-CM | POA: Diagnosis not present

## 2017-01-14 DIAGNOSIS — K219 Gastro-esophageal reflux disease without esophagitis: Secondary | ICD-10-CM | POA: Diagnosis not present

## 2017-01-14 DIAGNOSIS — Z471 Aftercare following joint replacement surgery: Secondary | ICD-10-CM | POA: Diagnosis not present

## 2017-01-20 DIAGNOSIS — M1611 Unilateral primary osteoarthritis, right hip: Secondary | ICD-10-CM | POA: Diagnosis not present

## 2017-01-20 DIAGNOSIS — D649 Anemia, unspecified: Secondary | ICD-10-CM | POA: Diagnosis not present

## 2017-01-20 DIAGNOSIS — M549 Dorsalgia, unspecified: Secondary | ICD-10-CM | POA: Diagnosis not present

## 2017-01-20 DIAGNOSIS — Z853 Personal history of malignant neoplasm of breast: Secondary | ICD-10-CM | POA: Diagnosis not present

## 2017-01-20 DIAGNOSIS — Z96641 Presence of right artificial hip joint: Secondary | ICD-10-CM | POA: Diagnosis not present

## 2017-01-20 DIAGNOSIS — K219 Gastro-esophageal reflux disease without esophagitis: Secondary | ICD-10-CM | POA: Diagnosis not present

## 2017-01-20 DIAGNOSIS — Z471 Aftercare following joint replacement surgery: Secondary | ICD-10-CM | POA: Diagnosis not present

## 2017-01-20 DIAGNOSIS — Z7901 Long term (current) use of anticoagulants: Secondary | ICD-10-CM | POA: Diagnosis not present

## 2017-01-20 DIAGNOSIS — Z79891 Long term (current) use of opiate analgesic: Secondary | ICD-10-CM | POA: Diagnosis not present

## 2017-01-20 DIAGNOSIS — R350 Frequency of micturition: Secondary | ICD-10-CM | POA: Diagnosis not present

## 2017-01-20 DIAGNOSIS — I2510 Atherosclerotic heart disease of native coronary artery without angina pectoris: Secondary | ICD-10-CM | POA: Diagnosis not present

## 2017-01-26 ENCOUNTER — Other Ambulatory Visit (HOSPITAL_COMMUNITY): Payer: Self-pay | Admitting: Internal Medicine

## 2017-01-26 ENCOUNTER — Ambulatory Visit (HOSPITAL_COMMUNITY)
Admission: RE | Admit: 2017-01-26 | Discharge: 2017-01-26 | Disposition: A | Discharge status: Home / Self Care | Payer: Medicare Other | Source: Ambulatory Visit | Attending: Internal Medicine | Admitting: Internal Medicine

## 2017-01-26 DIAGNOSIS — Z1231 Encounter for screening mammogram for malignant neoplasm of breast: Secondary | ICD-10-CM

## 2017-02-02 DIAGNOSIS — I10 Essential (primary) hypertension: Secondary | ICD-10-CM | POA: Diagnosis not present

## 2017-02-02 DIAGNOSIS — N3289 Other specified disorders of bladder: Secondary | ICD-10-CM | POA: Diagnosis not present

## 2017-02-02 DIAGNOSIS — M25551 Pain in right hip: Secondary | ICD-10-CM | POA: Diagnosis not present

## 2017-02-02 DIAGNOSIS — R350 Frequency of micturition: Secondary | ICD-10-CM | POA: Diagnosis not present

## 2017-02-17 DIAGNOSIS — M1611 Unilateral primary osteoarthritis, right hip: Secondary | ICD-10-CM | POA: Diagnosis not present

## 2017-02-17 DIAGNOSIS — Z471 Aftercare following joint replacement surgery: Secondary | ICD-10-CM | POA: Diagnosis not present

## 2017-02-17 DIAGNOSIS — Z96641 Presence of right artificial hip joint: Secondary | ICD-10-CM | POA: Diagnosis not present

## 2017-02-26 DIAGNOSIS — E782 Mixed hyperlipidemia: Secondary | ICD-10-CM | POA: Diagnosis not present

## 2017-02-26 DIAGNOSIS — D509 Iron deficiency anemia, unspecified: Secondary | ICD-10-CM | POA: Diagnosis not present

## 2017-02-26 DIAGNOSIS — I10 Essential (primary) hypertension: Secondary | ICD-10-CM | POA: Diagnosis not present

## 2017-03-02 DIAGNOSIS — I10 Essential (primary) hypertension: Secondary | ICD-10-CM | POA: Diagnosis not present

## 2017-03-02 DIAGNOSIS — R300 Dysuria: Secondary | ICD-10-CM | POA: Diagnosis not present

## 2017-03-17 DIAGNOSIS — M1611 Unilateral primary osteoarthritis, right hip: Secondary | ICD-10-CM | POA: Diagnosis not present

## 2017-03-17 DIAGNOSIS — Z471 Aftercare following joint replacement surgery: Secondary | ICD-10-CM | POA: Diagnosis not present

## 2017-03-17 DIAGNOSIS — Z96641 Presence of right artificial hip joint: Secondary | ICD-10-CM | POA: Diagnosis not present

## 2017-05-12 DIAGNOSIS — R945 Abnormal results of liver function studies: Secondary | ICD-10-CM | POA: Diagnosis not present

## 2017-05-12 DIAGNOSIS — M2550 Pain in unspecified joint: Secondary | ICD-10-CM | POA: Diagnosis not present

## 2017-05-12 DIAGNOSIS — M0579 Rheumatoid arthritis with rheumatoid factor of multiple sites without organ or systems involvement: Secondary | ICD-10-CM | POA: Diagnosis not present

## 2017-05-12 DIAGNOSIS — M7989 Other specified soft tissue disorders: Secondary | ICD-10-CM | POA: Diagnosis not present

## 2017-05-12 DIAGNOSIS — R5383 Other fatigue: Secondary | ICD-10-CM | POA: Diagnosis not present

## 2017-06-10 DIAGNOSIS — M2550 Pain in unspecified joint: Secondary | ICD-10-CM | POA: Diagnosis not present

## 2017-06-10 DIAGNOSIS — H2513 Age-related nuclear cataract, bilateral: Secondary | ICD-10-CM | POA: Diagnosis not present

## 2017-06-10 DIAGNOSIS — R5383 Other fatigue: Secondary | ICD-10-CM | POA: Diagnosis not present

## 2017-06-10 DIAGNOSIS — M0579 Rheumatoid arthritis with rheumatoid factor of multiple sites without organ or systems involvement: Secondary | ICD-10-CM | POA: Diagnosis not present

## 2017-06-10 DIAGNOSIS — H2512 Age-related nuclear cataract, left eye: Secondary | ICD-10-CM | POA: Diagnosis not present

## 2017-06-10 DIAGNOSIS — R945 Abnormal results of liver function studies: Secondary | ICD-10-CM | POA: Diagnosis not present

## 2017-06-10 DIAGNOSIS — M7989 Other specified soft tissue disorders: Secondary | ICD-10-CM | POA: Diagnosis not present

## 2017-06-23 DIAGNOSIS — H2512 Age-related nuclear cataract, left eye: Secondary | ICD-10-CM | POA: Diagnosis not present

## 2017-06-23 DIAGNOSIS — H25812 Combined forms of age-related cataract, left eye: Secondary | ICD-10-CM | POA: Diagnosis not present

## 2017-06-24 DIAGNOSIS — H2511 Age-related nuclear cataract, right eye: Secondary | ICD-10-CM | POA: Diagnosis not present

## 2017-06-30 DIAGNOSIS — H25811 Combined forms of age-related cataract, right eye: Secondary | ICD-10-CM | POA: Diagnosis not present

## 2017-06-30 DIAGNOSIS — H2511 Age-related nuclear cataract, right eye: Secondary | ICD-10-CM | POA: Diagnosis not present

## 2017-07-02 DIAGNOSIS — R202 Paresthesia of skin: Secondary | ICD-10-CM | POA: Diagnosis not present

## 2017-07-02 DIAGNOSIS — R7301 Impaired fasting glucose: Secondary | ICD-10-CM | POA: Diagnosis not present

## 2017-07-02 DIAGNOSIS — R0602 Shortness of breath: Secondary | ICD-10-CM | POA: Diagnosis not present

## 2017-07-02 DIAGNOSIS — G2581 Restless legs syndrome: Secondary | ICD-10-CM | POA: Diagnosis not present

## 2017-07-02 DIAGNOSIS — R300 Dysuria: Secondary | ICD-10-CM | POA: Diagnosis not present

## 2017-07-02 DIAGNOSIS — I10 Essential (primary) hypertension: Secondary | ICD-10-CM | POA: Diagnosis not present

## 2017-07-06 DIAGNOSIS — R944 Abnormal results of kidney function studies: Secondary | ICD-10-CM | POA: Diagnosis not present

## 2017-07-06 DIAGNOSIS — M25551 Pain in right hip: Secondary | ICD-10-CM | POA: Diagnosis not present

## 2017-07-06 DIAGNOSIS — I10 Essential (primary) hypertension: Secondary | ICD-10-CM | POA: Diagnosis not present

## 2017-07-06 DIAGNOSIS — R300 Dysuria: Secondary | ICD-10-CM | POA: Diagnosis not present

## 2017-07-06 DIAGNOSIS — D509 Iron deficiency anemia, unspecified: Secondary | ICD-10-CM | POA: Diagnosis not present

## 2017-07-08 DIAGNOSIS — M1611 Unilateral primary osteoarthritis, right hip: Secondary | ICD-10-CM | POA: Diagnosis not present

## 2017-07-24 DIAGNOSIS — M25562 Pain in left knee: Secondary | ICD-10-CM | POA: Diagnosis not present

## 2017-07-24 DIAGNOSIS — M25572 Pain in left ankle and joints of left foot: Secondary | ICD-10-CM | POA: Diagnosis not present

## 2017-07-24 DIAGNOSIS — R600 Localized edema: Secondary | ICD-10-CM | POA: Diagnosis not present

## 2017-07-30 DIAGNOSIS — H524 Presbyopia: Secondary | ICD-10-CM | POA: Diagnosis not present

## 2017-07-31 DIAGNOSIS — R600 Localized edema: Secondary | ICD-10-CM | POA: Diagnosis not present

## 2017-07-31 DIAGNOSIS — M25572 Pain in left ankle and joints of left foot: Secondary | ICD-10-CM | POA: Diagnosis not present

## 2017-07-31 DIAGNOSIS — M25562 Pain in left knee: Secondary | ICD-10-CM | POA: Diagnosis not present

## 2017-08-04 DIAGNOSIS — M25562 Pain in left knee: Secondary | ICD-10-CM | POA: Diagnosis not present

## 2017-08-25 DIAGNOSIS — R5383 Other fatigue: Secondary | ICD-10-CM | POA: Diagnosis not present

## 2017-08-25 DIAGNOSIS — M2550 Pain in unspecified joint: Secondary | ICD-10-CM | POA: Diagnosis not present

## 2017-08-25 DIAGNOSIS — M7989 Other specified soft tissue disorders: Secondary | ICD-10-CM | POA: Diagnosis not present

## 2017-08-25 DIAGNOSIS — M0579 Rheumatoid arthritis with rheumatoid factor of multiple sites without organ or systems involvement: Secondary | ICD-10-CM | POA: Diagnosis not present

## 2017-08-25 DIAGNOSIS — R945 Abnormal results of liver function studies: Secondary | ICD-10-CM | POA: Diagnosis not present

## 2017-10-01 DIAGNOSIS — E119 Type 2 diabetes mellitus without complications: Secondary | ICD-10-CM | POA: Diagnosis not present

## 2017-10-06 DIAGNOSIS — I10 Essential (primary) hypertension: Secondary | ICD-10-CM | POA: Diagnosis not present

## 2017-10-06 DIAGNOSIS — D509 Iron deficiency anemia, unspecified: Secondary | ICD-10-CM | POA: Diagnosis not present

## 2017-10-06 DIAGNOSIS — Z96641 Presence of right artificial hip joint: Secondary | ICD-10-CM | POA: Diagnosis not present

## 2017-10-06 DIAGNOSIS — R945 Abnormal results of liver function studies: Secondary | ICD-10-CM | POA: Diagnosis not present

## 2017-10-06 DIAGNOSIS — M25551 Pain in right hip: Secondary | ICD-10-CM | POA: Diagnosis not present

## 2017-10-22 DIAGNOSIS — M2550 Pain in unspecified joint: Secondary | ICD-10-CM | POA: Diagnosis not present

## 2017-10-22 DIAGNOSIS — R945 Abnormal results of liver function studies: Secondary | ICD-10-CM | POA: Diagnosis not present

## 2017-10-22 DIAGNOSIS — R5383 Other fatigue: Secondary | ICD-10-CM | POA: Diagnosis not present

## 2017-10-22 DIAGNOSIS — M0579 Rheumatoid arthritis with rheumatoid factor of multiple sites without organ or systems involvement: Secondary | ICD-10-CM | POA: Diagnosis not present

## 2017-10-27 DIAGNOSIS — M25562 Pain in left knee: Secondary | ICD-10-CM | POA: Insufficient documentation

## 2017-10-27 DIAGNOSIS — M25572 Pain in left ankle and joints of left foot: Secondary | ICD-10-CM | POA: Diagnosis not present

## 2017-10-27 DIAGNOSIS — M79672 Pain in left foot: Secondary | ICD-10-CM | POA: Diagnosis not present

## 2017-11-04 DIAGNOSIS — M25562 Pain in left knee: Secondary | ICD-10-CM | POA: Diagnosis not present

## 2017-11-12 DIAGNOSIS — M25562 Pain in left knee: Secondary | ICD-10-CM | POA: Diagnosis not present

## 2018-01-11 ENCOUNTER — Other Ambulatory Visit (HOSPITAL_COMMUNITY): Payer: Self-pay | Admitting: Internal Medicine

## 2018-01-11 DIAGNOSIS — Z1231 Encounter for screening mammogram for malignant neoplasm of breast: Secondary | ICD-10-CM

## 2018-01-16 DIAGNOSIS — S30860A Insect bite (nonvenomous) of lower back and pelvis, initial encounter: Secondary | ICD-10-CM | POA: Diagnosis not present

## 2018-01-16 DIAGNOSIS — J060 Acute laryngopharyngitis: Secondary | ICD-10-CM | POA: Diagnosis not present

## 2018-01-16 DIAGNOSIS — R944 Abnormal results of kidney function studies: Secondary | ICD-10-CM | POA: Diagnosis not present

## 2018-01-16 DIAGNOSIS — R0602 Shortness of breath: Secondary | ICD-10-CM | POA: Diagnosis not present

## 2018-01-16 DIAGNOSIS — R202 Paresthesia of skin: Secondary | ICD-10-CM | POA: Diagnosis not present

## 2018-01-16 DIAGNOSIS — R7301 Impaired fasting glucose: Secondary | ICD-10-CM | POA: Diagnosis not present

## 2018-01-16 DIAGNOSIS — G2581 Restless legs syndrome: Secondary | ICD-10-CM | POA: Diagnosis not present

## 2018-01-18 DIAGNOSIS — R944 Abnormal results of kidney function studies: Secondary | ICD-10-CM | POA: Diagnosis not present

## 2018-01-18 DIAGNOSIS — R7301 Impaired fasting glucose: Secondary | ICD-10-CM | POA: Diagnosis not present

## 2018-01-18 DIAGNOSIS — I10 Essential (primary) hypertension: Secondary | ICD-10-CM | POA: Diagnosis not present

## 2018-01-22 ENCOUNTER — Ambulatory Visit (HOSPITAL_COMMUNITY)
Admission: RE | Admit: 2018-01-22 | Discharge: 2018-01-22 | Disposition: A | Discharge status: Home / Self Care | Payer: Medicare Other | Source: Ambulatory Visit | Attending: Internal Medicine | Admitting: Internal Medicine

## 2018-01-22 ENCOUNTER — Other Ambulatory Visit (HOSPITAL_COMMUNITY): Payer: Self-pay | Admitting: Internal Medicine

## 2018-01-22 DIAGNOSIS — Z1231 Encounter for screening mammogram for malignant neoplasm of breast: Secondary | ICD-10-CM | POA: Diagnosis not present

## 2018-01-26 DIAGNOSIS — R944 Abnormal results of kidney function studies: Secondary | ICD-10-CM | POA: Diagnosis not present

## 2018-01-26 DIAGNOSIS — Z0000 Encounter for general adult medical examination without abnormal findings: Secondary | ICD-10-CM | POA: Diagnosis not present

## 2018-01-26 DIAGNOSIS — R945 Abnormal results of liver function studies: Secondary | ICD-10-CM | POA: Diagnosis not present

## 2018-01-26 DIAGNOSIS — I10 Essential (primary) hypertension: Secondary | ICD-10-CM | POA: Diagnosis not present

## 2018-01-26 DIAGNOSIS — D509 Iron deficiency anemia, unspecified: Secondary | ICD-10-CM | POA: Diagnosis not present

## 2018-01-26 DIAGNOSIS — M25551 Pain in right hip: Secondary | ICD-10-CM | POA: Diagnosis not present

## 2018-02-10 DIAGNOSIS — R5383 Other fatigue: Secondary | ICD-10-CM | POA: Diagnosis not present

## 2018-02-10 DIAGNOSIS — R945 Abnormal results of liver function studies: Secondary | ICD-10-CM | POA: Diagnosis not present

## 2018-02-10 DIAGNOSIS — M2550 Pain in unspecified joint: Secondary | ICD-10-CM | POA: Diagnosis not present

## 2018-02-10 DIAGNOSIS — M0579 Rheumatoid arthritis with rheumatoid factor of multiple sites without organ or systems involvement: Secondary | ICD-10-CM | POA: Diagnosis not present

## 2018-02-10 DIAGNOSIS — M25562 Pain in left knee: Secondary | ICD-10-CM | POA: Diagnosis not present

## 2018-02-23 DIAGNOSIS — I10 Essential (primary) hypertension: Secondary | ICD-10-CM | POA: Diagnosis not present

## 2018-02-23 DIAGNOSIS — R0602 Shortness of breath: Secondary | ICD-10-CM | POA: Diagnosis not present

## 2018-02-23 DIAGNOSIS — M25551 Pain in right hip: Secondary | ICD-10-CM | POA: Diagnosis not present

## 2018-02-23 DIAGNOSIS — R945 Abnormal results of liver function studies: Secondary | ICD-10-CM | POA: Diagnosis not present

## 2018-02-23 DIAGNOSIS — R944 Abnormal results of kidney function studies: Secondary | ICD-10-CM | POA: Diagnosis not present

## 2018-02-23 DIAGNOSIS — G2581 Restless legs syndrome: Secondary | ICD-10-CM | POA: Diagnosis not present

## 2018-02-23 DIAGNOSIS — Z23 Encounter for immunization: Secondary | ICD-10-CM | POA: Diagnosis not present

## 2018-02-23 DIAGNOSIS — R7301 Impaired fasting glucose: Secondary | ICD-10-CM | POA: Diagnosis not present

## 2018-02-23 DIAGNOSIS — R202 Paresthesia of skin: Secondary | ICD-10-CM | POA: Diagnosis not present

## 2018-02-24 ENCOUNTER — Encounter: Payer: Self-pay | Admitting: Internal Medicine

## 2018-02-26 DIAGNOSIS — Z23 Encounter for immunization: Secondary | ICD-10-CM | POA: Diagnosis not present

## 2018-03-08 ENCOUNTER — Other Ambulatory Visit (HOSPITAL_COMMUNITY): Payer: Self-pay | Admitting: Internal Medicine

## 2018-03-08 DIAGNOSIS — R1031 Right lower quadrant pain: Secondary | ICD-10-CM

## 2018-04-13 DIAGNOSIS — M05849 Other rheumatoid arthritis with rheumatoid factor of unspecified hand: Secondary | ICD-10-CM | POA: Diagnosis not present

## 2018-04-13 DIAGNOSIS — I10 Essential (primary) hypertension: Secondary | ICD-10-CM | POA: Diagnosis not present

## 2018-04-13 DIAGNOSIS — R945 Abnormal results of liver function studies: Secondary | ICD-10-CM | POA: Diagnosis not present

## 2018-04-13 DIAGNOSIS — M25559 Pain in unspecified hip: Secondary | ICD-10-CM | POA: Diagnosis not present

## 2018-04-13 DIAGNOSIS — R944 Abnormal results of kidney function studies: Secondary | ICD-10-CM | POA: Diagnosis not present

## 2018-05-13 DIAGNOSIS — M0579 Rheumatoid arthritis with rheumatoid factor of multiple sites without organ or systems involvement: Secondary | ICD-10-CM | POA: Diagnosis not present

## 2018-05-13 DIAGNOSIS — R945 Abnormal results of liver function studies: Secondary | ICD-10-CM | POA: Diagnosis not present

## 2018-05-13 DIAGNOSIS — R5383 Other fatigue: Secondary | ICD-10-CM | POA: Diagnosis not present

## 2018-05-13 DIAGNOSIS — M2550 Pain in unspecified joint: Secondary | ICD-10-CM | POA: Diagnosis not present

## 2018-05-26 DIAGNOSIS — M79672 Pain in left foot: Secondary | ICD-10-CM | POA: Diagnosis not present

## 2018-05-26 DIAGNOSIS — M19072 Primary osteoarthritis, left ankle and foot: Secondary | ICD-10-CM | POA: Diagnosis not present

## 2018-05-26 DIAGNOSIS — M25572 Pain in left ankle and joints of left foot: Secondary | ICD-10-CM | POA: Diagnosis not present

## 2018-07-26 DIAGNOSIS — R300 Dysuria: Secondary | ICD-10-CM | POA: Diagnosis not present

## 2018-07-26 DIAGNOSIS — R7301 Impaired fasting glucose: Secondary | ICD-10-CM | POA: Diagnosis not present

## 2018-07-26 DIAGNOSIS — R944 Abnormal results of kidney function studies: Secondary | ICD-10-CM | POA: Diagnosis not present

## 2018-07-26 DIAGNOSIS — M25572 Pain in left ankle and joints of left foot: Secondary | ICD-10-CM | POA: Diagnosis not present

## 2018-07-26 DIAGNOSIS — I10 Essential (primary) hypertension: Secondary | ICD-10-CM | POA: Diagnosis not present

## 2018-07-30 DIAGNOSIS — I10 Essential (primary) hypertension: Secondary | ICD-10-CM | POA: Diagnosis not present

## 2018-07-30 DIAGNOSIS — R944 Abnormal results of kidney function studies: Secondary | ICD-10-CM | POA: Diagnosis not present

## 2018-07-30 DIAGNOSIS — M1991 Primary osteoarthritis, unspecified site: Secondary | ICD-10-CM | POA: Diagnosis not present

## 2018-07-30 DIAGNOSIS — M25551 Pain in right hip: Secondary | ICD-10-CM | POA: Diagnosis not present

## 2018-07-30 DIAGNOSIS — M069 Rheumatoid arthritis, unspecified: Secondary | ICD-10-CM | POA: Diagnosis not present

## 2018-09-03 DIAGNOSIS — Z0000 Encounter for general adult medical examination without abnormal findings: Secondary | ICD-10-CM | POA: Diagnosis not present

## 2018-10-27 DIAGNOSIS — G2581 Restless legs syndrome: Secondary | ICD-10-CM | POA: Diagnosis not present

## 2018-10-27 DIAGNOSIS — I10 Essential (primary) hypertension: Secondary | ICD-10-CM | POA: Diagnosis not present

## 2018-10-27 DIAGNOSIS — G5690 Unspecified mononeuropathy of unspecified upper limb: Secondary | ICD-10-CM | POA: Diagnosis not present

## 2018-10-27 DIAGNOSIS — J060 Acute laryngopharyngitis: Secondary | ICD-10-CM | POA: Diagnosis not present

## 2018-10-27 DIAGNOSIS — R3915 Urgency of urination: Secondary | ICD-10-CM | POA: Diagnosis not present

## 2018-10-27 DIAGNOSIS — D509 Iron deficiency anemia, unspecified: Secondary | ICD-10-CM | POA: Diagnosis not present

## 2018-10-27 DIAGNOSIS — R300 Dysuria: Secondary | ICD-10-CM | POA: Diagnosis not present

## 2018-11-15 DIAGNOSIS — R5383 Other fatigue: Secondary | ICD-10-CM | POA: Diagnosis not present

## 2018-11-15 DIAGNOSIS — M0579 Rheumatoid arthritis with rheumatoid factor of multiple sites without organ or systems involvement: Secondary | ICD-10-CM | POA: Diagnosis not present

## 2018-11-15 DIAGNOSIS — M2550 Pain in unspecified joint: Secondary | ICD-10-CM | POA: Diagnosis not present

## 2018-11-15 DIAGNOSIS — R945 Abnormal results of liver function studies: Secondary | ICD-10-CM | POA: Diagnosis not present

## 2018-11-27 DIAGNOSIS — N3000 Acute cystitis without hematuria: Secondary | ICD-10-CM | POA: Diagnosis not present

## 2018-12-22 DIAGNOSIS — R5383 Other fatigue: Secondary | ICD-10-CM | POA: Diagnosis not present

## 2018-12-22 DIAGNOSIS — M0579 Rheumatoid arthritis with rheumatoid factor of multiple sites without organ or systems involvement: Secondary | ICD-10-CM | POA: Diagnosis not present

## 2018-12-22 DIAGNOSIS — R945 Abnormal results of liver function studies: Secondary | ICD-10-CM | POA: Diagnosis not present

## 2018-12-22 DIAGNOSIS — M2550 Pain in unspecified joint: Secondary | ICD-10-CM | POA: Diagnosis not present

## 2019-01-05 ENCOUNTER — Other Ambulatory Visit (HOSPITAL_COMMUNITY): Payer: Self-pay | Admitting: Internal Medicine

## 2019-01-05 DIAGNOSIS — Z1231 Encounter for screening mammogram for malignant neoplasm of breast: Secondary | ICD-10-CM

## 2019-01-25 ENCOUNTER — Encounter: Payer: Self-pay | Admitting: Internal Medicine

## 2019-01-25 ENCOUNTER — Encounter: Payer: Self-pay | Admitting: *Deleted

## 2019-01-25 ENCOUNTER — Ambulatory Visit (INDEPENDENT_AMBULATORY_CARE_PROVIDER_SITE_OTHER): Payer: Medicare Other | Admitting: Internal Medicine

## 2019-01-25 ENCOUNTER — Other Ambulatory Visit: Payer: Self-pay

## 2019-01-25 ENCOUNTER — Other Ambulatory Visit: Payer: Self-pay | Admitting: *Deleted

## 2019-01-25 VITALS — BP 157/80 | HR 76 | Temp 97.3°F | Ht 65.0 in | Wt 166.8 lb

## 2019-01-25 DIAGNOSIS — R1310 Dysphagia, unspecified: Secondary | ICD-10-CM

## 2019-01-25 DIAGNOSIS — R198 Other specified symptoms and signs involving the digestive system and abdomen: Secondary | ICD-10-CM | POA: Diagnosis not present

## 2019-01-25 DIAGNOSIS — R1319 Other dysphagia: Secondary | ICD-10-CM

## 2019-01-25 DIAGNOSIS — K219 Gastro-esophageal reflux disease without esophagitis: Secondary | ICD-10-CM

## 2019-01-25 MED ORDER — OMEPRAZOLE 20 MG PO CPDR: 20.0000 mg | DELAYED_RELEASE_CAPSULE | Freq: Every day | ORAL | 11 refills | Status: DC

## 2019-01-25 NOTE — Patient Instructions (Signed)
Take Prilosec 20 mg daily (disp 30 with 11 refills)  Begin Benefiber 1 tablespoon daily x 3 weeks; then increase to 2 tablespoons thereafter.  Schedule an EGD with esophageal dilation - propofol - esophageal ddysphagia  As discussed, I do not recommend a future unless new symptoms develop  Further recommendations to follow

## 2019-01-25 NOTE — Progress Notes (Signed)
Primary Care Physician:  Celene Squibb, MD Primary Gastroenterologist:  Dr. Gala Romney  Pre-Procedure History & Physical: HPI:  [name] is a 78 y.o. female here for follow-up of GERD.  Got a letter for to set up a follow-up colonoscopy.  She had 2 colonoscopies in the last 10 years last being about 5 years ago.  Diverticulosis and hemorrhoids.  No polyps on either exam.  She passes "hard balls daily as far as bowel movement concerned no bleeding.  Takes 2 stool softener tablets at night no laxative or fiber supplement Has intermittent GERD for which takes Prilosec "occasionally".  Does have recurrent esophageal dysphagia and undergone esophageal dilation in the past.  No structural lesion previously found but she does well after Maloney dilation.  Medical record says she is on Xarelto but she denies ever taking any blood thinners.  Past Medical History:  Diagnosis Date  . Anemia    "little bit"  . Arthritis    back  . Breast cancer ([place])    Right mastectomy  . Bruises easily   . Chronic back pain   . Coronary atherosclerosis of native coronary artery    Nonobstructive at catheterization 2008 - 40% circumflex  . Cystocele 02/20/2015  . Diverticula of colon   . Environmental allergies   . Fitting and adjustment of pessary 05/29/2015   Inserted milex ring with support #2   . GERD (gastroesophageal reflux disease)    doesn't take any meds from this.  Takes tums as neeeded.  . Hematuria 02/20/2015  . History of bronchitis    >61yrs ago  . History of colon polyps   . History of diverticulosis 05/04/2015  . History of migraine    last time a few months ago  . Insomnia    doesn't take any meds  . Kidney infection    history of  . Nocturia   . Panic attacks    no meds required  . Pelvic pain in female 02/20/2015  . 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 Rectal pain, chronic 05/18/2015  . Seasonal allergies   . Shortness of breath    with exertion  . Urinary  frequency   . Urinary urgency     Past Surgical History:  Procedure Laterality Date  . ABDOMINAL HYSTERECTOMY    . BACK SURGERY  1981/2011/2014  . BLADDER SUSPENSION    . BREAST RECONSTRUCTION  2004   with abd tissue  . BREAST SURGERY Right   . CHOLECYSTECTOMY    . COLONOSCOPY  12/2009   RMR: few pancolonic diverticula. next TCS 12/2014  . COLONOSCOPY  06/14/2007   [id number] rectal polyp, status post cold biopsy removal/ Anal canal hemorrhoids/Left-sided diverticula Colonic mucosa appeared normal.Hyperplastic polyp.   . COLONOSCOPY  01/18/2004   [id number] diverticula/Internal hemorrhoids.  Otherwise normal rectum  . COLONOSCOPY N/A 04/18/2013   Dr. Gala Romney- normal rectum, scattered left sided diverticula the remainder of the colonic mucosa appeared normal  . COLONOSCOPY N/A 08/18/2014   Procedure: COLONOSCOPY;  Surgeon: Daneil Dolin, MD;  [facility];  Service: Endoscopy;  Laterality: N/A;  1000  . ESOPHAGOGASTRODUODENOSCOPY  06/14/2007   [id number] plaques in esophageal mucosa of uncertain significance brushed and biopsied/Normal stomach, normal first duodenum and second duodenoscopy. KOH negative. esophageal bx c/w GERD  . ESOPHAGOGASTRODUODENOSCOPY N/A 08/18/2014   Procedure: ESOPHAGOGASTRODUODENOSCOPY (EGD);  Surgeon: Daneil Dolin, MD;  [facility];  Service: Endoscopy;  Laterality: N/A;  . ESOPHAGOGASTRODUODENOSCOPY (EGD)  WITH ESOPHAGEAL DILATION N/A 04/18/2013   Dr. Gala Romney- normal, patent appearing, tubular esophagus, normal gastric mucosa, paptent pylorus, normal first and second portion of the duodenum  . EYE SURGERY     lasik  . FOOT SURGERY Right 2014   d/t plantar fascitis  . GIVENS CAPSULE STUDY N/A 07/18/2013   L.Lewis PAC- markedly abnormal appearing gastric mucosa, sugnificant bile reflux, questionable subucosal small bowel mass versus extrinsic compression in the distal small bowel.- CTE= no small bowel mass or tumor seen.  Marland Kitchen MALONEY  DILATION N/A 08/18/2014   Procedure: Venia Minks DILATION;  Surgeon: Daneil Dolin, MD;  [facility];  Service: Endoscopy;  Laterality: N/A;  . MASTECTOMY     right side 12-13 years ago  . TOTAL HIP ARTHROPLASTY Left 02/29/2016  . TOTAL HIP ARTHROPLASTY Left 02/29/2016   Procedure: TOTAL HIP ARTHROPLASTY ANTERIOR APPROACH;  Surgeon: Frederik Pear, MD;  Location: [place];  Service: Orthopedics;  Laterality: Left;  . TOTAL HIP ARTHROPLASTY Right 01/05/2017   Procedure: TOTAL HIP ARTHROPLASTY ANTERIOR APPROACH;  Surgeon: Frederik Pear, MD;  Location: [place];  Service: Orthopedics;  Laterality: Right;  REQUEST 79 MINS    Prior to Admission medications   Medication Sig Start Date End Date Taking? Authorizing Provider  amLODipine (NORVASC) 5 MG tablet Take 5 mg by mouth at bedtime.  02/03/14  Yes [provider]  gabapentin (NEURONTIN) 100 MG capsule Take 100 mg by mouth 3 (three) times daily.   Yes [provider]  oxyCODONE-acetaminophen (ROXICET) 5-325 MG tablet Take 1 tablet by mouth every 4 (four) hours as needed. 01/05/17  Yes Leighton Parody, PA-C  polyethylene glycol powder [facility]) powder Take 17 g daily in fluid 05/04/15  Yes Estill Dooms, NP  ENBREL SURECLICK 50 MG/ML injection Inject 50 mg into the skin once a week. On Fridays 11/26/16   [provider]  rivaroxaban (XARELTO) 20 MG TABS tablet Take 1 tablet (20 mg total) by mouth daily with supper. Patient not taking: Reported on 01/25/2019 01/05/17   Leighton Parody, PA-C  rOPINIRole (REQUIP) 0.25 MG tablet TAKE 2 AT BEDTIME CAN TAKE UP TO 3 AT BEDTIME 12/03/16   [provider]  tiZANidine (ZANAFLEX) 2 MG tablet Take 1 tablet (2 mg total) by mouth every 6 (six) hours as needed for muscle spasms. Patient not taking: Reported on 01/25/2019 01/05/17   Leighton Parody, PA-C    Allergies as of 01/25/2019 - Review Complete 01/25/2019  Allergen Reaction Noted  . Penicillins Anaphylaxis,  Swelling, and Other (See Comments)   . Apixaban Hives 05/06/2016  . Aspirin Other (See Comments)     Family History  Problem Relation Age of Onset  . Coronary artery disease Mother   . Coronary artery disease Father   . Coronary artery disease Brother   . Cancer Sister        lung cancer, two sisters  . Colon cancer Brother        less than age 56  . Lung cancer Sister   . Cancer Brother   . Emphysema Brother     Social History   Socioeconomic History  . Marital status: Married    Spouse name: [name]  . Number of children: 4  . Years of education: 12th  . Highest education level: Not on file  Occupational History    Employer: RETIRED  Social Needs  . Financial resource strain: Not on file  . Food insecurity    Worry: Not on  file    Inability: Not on file  . Transportation needs    Medical: Not on file    Non-medical: Not on file  Tobacco Use  . Smoking status: Former Smoker    Packs/day: 1.50    Years: 30.00    Pack years: 45.00    Types: Cigarettes    Quit date: 04/28/2000    Years since quitting: 18.7  . Smokeless tobacco: Never Used  . Tobacco comment: quit about 57yrs ago  Substance and Sexual Activity  . Alcohol use: No  . Drug use: No  . Sexual activity: Not Currently    Birth control/protection: Surgical    Comment: hyst  Lifestyle  . Physical activity    Days per week: Not on file    Minutes per session: Not on file  . Stress: Not on file  Relationships  . Social Herbalist on phone: Not on file    Gets together: Not on file    Attends religious service: Not on file    Active member of club or organization: Not on file    Attends meetings of clubs or organizations: Not on file    Relationship status: Not on file  . Intimate partner violence    Fear of current or ex partner: Not on file    Emotionally abused: Not on file    Physically abused: Not on file    Forced sexual activity: Not on file  Other Topics Concern  . Not on file   Social History Narrative   Patient lives at home with spouse.   Caffeine Use: 2 cups of coffee daily    Review of Systems: See HPI, otherwise negative ROS  Physical Exam: BP (!) 157/80   Pulse 76   Temp (!) 97.3 F (36.3 C) (Oral)   Ht 5\' 5"  (1.651 m)   Wt 166 lb 12.8 oz (75.7 kg)   BMI 27.76 kg/m  General:   Alert,  Well-developed, well-nourished, pleasant and cooperative in NAD Neck:  Supple; no masses or thyromegaly. No significant cervical adenopathy. Lungs:  Clear throughout to auscultation.   No wheezes, crackles, or rhonchi. No acute distress. Heart:  Regular rate and rhythm; no murmurs, clicks, rubs,  or gallops. Abdomen: Non-distended, normal bowel sounds.  Soft and nontender without appreciable mass or hepatosplenomegaly.  Pulses:  Normal pulses noted. Extremities:  Without clubbing or edema.  Impression/Plan: 78 year old lady with recurrent esophageal dysphagia in setting of GERD and only sporadic PPI use.  She is responded nicely in the past to [facility] dilation.  She does have a tendency towards constipation.  She has had 2- colonoscopies in the last 10 years (diverticulosis).  At this point, I do not feel she needs future colonoscopy unless alarm symptoms develop.  Recommendations:  I have offered this nice lady diagnostic EGD with esophageal dilation as feasible/appropriate per plan.  We will utilize propofol.  The risks, benefits, limitations, alternatives and imponderables have been reviewed with the patient. Potential for esophageal dilation, biopsy, etc. have also been reviewed.  Questions have been answered. All parties agreeable.  I have also asked her to start taking Prilosec 20 mg daily every day.  Begin Benefiber 1 tablespoon daily x3 weeks then increase to 2 times daily thereafter.  Further recommendations to follow.    Notice: This dictation was prepared with Dragon dictation along with smaller phrase technology. Any transcriptional errors that  result from this process are unintentional and may not be corrected upon review.

## 2019-01-25 NOTE — Addendum Note (Signed)
Addended by: Everardo All on: 01/25/2019 09:57 AM   Modules accepted: Orders

## 2019-01-31 ENCOUNTER — Encounter (HOSPITAL_COMMUNITY): Payer: Self-pay

## 2019-01-31 ENCOUNTER — Other Ambulatory Visit (HOSPITAL_COMMUNITY): Payer: Self-pay | Admitting: Internal Medicine

## 2019-01-31 ENCOUNTER — Other Ambulatory Visit: Payer: Self-pay

## 2019-01-31 ENCOUNTER — Ambulatory Visit (HOSPITAL_COMMUNITY)
Admission: RE | Admit: 2019-01-31 | Discharge: 2019-01-31 | Disposition: A | Discharge status: Home / Self Care | Payer: Medicare Other | Source: Ambulatory Visit | Attending: Internal Medicine | Admitting: Internal Medicine

## 2019-01-31 DIAGNOSIS — Z1231 Encounter for screening mammogram for malignant neoplasm of breast: Secondary | ICD-10-CM

## 2019-02-14 DIAGNOSIS — D509 Iron deficiency anemia, unspecified: Secondary | ICD-10-CM | POA: Diagnosis not present

## 2019-02-14 DIAGNOSIS — R7301 Impaired fasting glucose: Secondary | ICD-10-CM | POA: Diagnosis not present

## 2019-02-14 DIAGNOSIS — I10 Essential (primary) hypertension: Secondary | ICD-10-CM | POA: Diagnosis not present

## 2019-02-16 DIAGNOSIS — I129 Hypertensive chronic kidney disease with stage 1 through stage 4 chronic kidney disease, or unspecified chronic kidney disease: Secondary | ICD-10-CM | POA: Diagnosis not present

## 2019-02-16 DIAGNOSIS — M069 Rheumatoid arthritis, unspecified: Secondary | ICD-10-CM | POA: Diagnosis not present

## 2019-02-16 DIAGNOSIS — N1832 Chronic kidney disease, stage 3b: Secondary | ICD-10-CM | POA: Diagnosis not present

## 2019-02-16 DIAGNOSIS — R945 Abnormal results of liver function studies: Secondary | ICD-10-CM | POA: Diagnosis not present

## 2019-02-16 DIAGNOSIS — R7301 Impaired fasting glucose: Secondary | ICD-10-CM | POA: Diagnosis not present

## 2019-03-08 DIAGNOSIS — M2550 Pain in unspecified joint: Secondary | ICD-10-CM | POA: Diagnosis not present

## 2019-03-08 DIAGNOSIS — M0579 Rheumatoid arthritis with rheumatoid factor of multiple sites without organ or systems involvement: Secondary | ICD-10-CM | POA: Diagnosis not present

## 2019-03-08 DIAGNOSIS — R945 Abnormal results of liver function studies: Secondary | ICD-10-CM | POA: Diagnosis not present

## 2019-03-08 DIAGNOSIS — R5383 Other fatigue: Secondary | ICD-10-CM | POA: Diagnosis not present

## 2019-03-15 ENCOUNTER — Telehealth: Payer: Self-pay | Admitting: Internal Medicine

## 2019-03-15 NOTE — Telephone Encounter (Signed)
Pt said she needed to reschedule her procedure with RMR on 12/10. [phone]

## 2019-03-15 NOTE — Telephone Encounter (Signed)
Pt called office, EGD/DIL w/Propofol w/RMR rescheduled to 06/16/19 at 9:15am. Pt stated her husband has alzheimer's and she doesn't have anyone to stay with him in December. Pt states she isn't taking Xarelto.  Endo scheduler informed. Pre-op 06/14/19 at 10:00am, COVID test at 11:00am. Appt letter mailed with new procedure instructions.

## 2019-03-15 NOTE — Telephone Encounter (Signed)
Pt is scheduled with RMR (EGD) on 04/07/2019. Routing to [facility].

## 2019-03-15 NOTE — Telephone Encounter (Signed)
Called pt, VM not set up

## 2019-03-18 DIAGNOSIS — Z636 Dependent relative needing care at home: Secondary | ICD-10-CM | POA: Diagnosis not present

## 2019-03-18 DIAGNOSIS — G4700 Insomnia, unspecified: Secondary | ICD-10-CM | POA: Diagnosis not present

## 2019-04-04 ENCOUNTER — Other Ambulatory Visit (HOSPITAL_COMMUNITY): Payer: Medicare Other

## 2019-04-05 ENCOUNTER — Other Ambulatory Visit (HOSPITAL_COMMUNITY): Payer: Medicare Other

## 2019-04-25 DIAGNOSIS — N182 Chronic kidney disease, stage 2 (mild): Secondary | ICD-10-CM | POA: Diagnosis not present

## 2019-04-25 DIAGNOSIS — R944 Abnormal results of kidney function studies: Secondary | ICD-10-CM | POA: Diagnosis not present

## 2019-04-25 DIAGNOSIS — I129 Hypertensive chronic kidney disease with stage 1 through stage 4 chronic kidney disease, or unspecified chronic kidney disease: Secondary | ICD-10-CM | POA: Diagnosis not present

## 2019-04-25 DIAGNOSIS — M05849 Other rheumatoid arthritis with rheumatoid factor of unspecified hand: Secondary | ICD-10-CM | POA: Diagnosis not present

## 2019-05-10 DIAGNOSIS — Z636 Dependent relative needing care at home: Secondary | ICD-10-CM | POA: Diagnosis not present

## 2019-05-16 ENCOUNTER — Telehealth: Payer: Self-pay | Admitting: Internal Medicine

## 2019-05-16 NOTE — Telephone Encounter (Signed)
Spoke to pt, EGD/DIL rescheduled to 08/18/19 at 8:30am. Endo scheduler informed.

## 2019-05-16 NOTE — Telephone Encounter (Signed)
Pre-op and COVID test 08/16/19. Appt letter mailed with procedure instructions.

## 2019-05-16 NOTE — Telephone Encounter (Signed)
Pt said her husband was really sick and she needed to cancel and reschedule her procedure with RMR on 06/16/2019 [phone]

## 2019-05-27 DIAGNOSIS — I10 Essential (primary) hypertension: Secondary | ICD-10-CM | POA: Diagnosis not present

## 2019-05-27 DIAGNOSIS — M05849 Other rheumatoid arthritis with rheumatoid factor of unspecified hand: Secondary | ICD-10-CM | POA: Diagnosis not present

## 2019-06-14 ENCOUNTER — Other Ambulatory Visit (HOSPITAL_COMMUNITY): Payer: Medicare Other

## 2019-07-12 ENCOUNTER — Telehealth: Payer: Self-pay | Admitting: Internal Medicine

## 2019-07-12 NOTE — Telephone Encounter (Signed)
Pt wants to cancel her procedure with RMR on 4/22. SHe is having to take care of her husband.

## 2019-07-12 NOTE — Telephone Encounter (Signed)
Called pt. She states she is having to take care of her husband and she needs to cancel procedure for 4/22. She did not want to r/s. Called endo and made aware.

## 2019-07-15 DIAGNOSIS — R7301 Impaired fasting glucose: Secondary | ICD-10-CM | POA: Diagnosis not present

## 2019-07-15 DIAGNOSIS — I129 Hypertensive chronic kidney disease with stage 1 through stage 4 chronic kidney disease, or unspecified chronic kidney disease: Secondary | ICD-10-CM | POA: Diagnosis not present

## 2019-07-15 DIAGNOSIS — N183 Chronic kidney disease, stage 3 unspecified: Secondary | ICD-10-CM | POA: Diagnosis not present

## 2019-07-15 DIAGNOSIS — R0602 Shortness of breath: Secondary | ICD-10-CM | POA: Diagnosis not present

## 2019-07-15 DIAGNOSIS — R220 Localized swelling, mass and lump, head: Secondary | ICD-10-CM | POA: Diagnosis not present

## 2019-07-15 DIAGNOSIS — R944 Abnormal results of kidney function studies: Secondary | ICD-10-CM | POA: Diagnosis not present

## 2019-07-15 DIAGNOSIS — N1832 Chronic kidney disease, stage 3b: Secondary | ICD-10-CM | POA: Diagnosis not present

## 2019-07-15 DIAGNOSIS — R202 Paresthesia of skin: Secondary | ICD-10-CM | POA: Diagnosis not present

## 2019-07-15 DIAGNOSIS — G2581 Restless legs syndrome: Secondary | ICD-10-CM | POA: Diagnosis not present

## 2019-08-02 DIAGNOSIS — M069 Rheumatoid arthritis, unspecified: Secondary | ICD-10-CM | POA: Diagnosis not present

## 2019-08-02 DIAGNOSIS — I129 Hypertensive chronic kidney disease with stage 1 through stage 4 chronic kidney disease, or unspecified chronic kidney disease: Secondary | ICD-10-CM | POA: Diagnosis not present

## 2019-08-02 DIAGNOSIS — R945 Abnormal results of liver function studies: Secondary | ICD-10-CM | POA: Diagnosis not present

## 2019-08-02 DIAGNOSIS — R7301 Impaired fasting glucose: Secondary | ICD-10-CM | POA: Diagnosis not present

## 2019-08-02 DIAGNOSIS — N1832 Chronic kidney disease, stage 3b: Secondary | ICD-10-CM | POA: Diagnosis not present

## 2019-08-16 ENCOUNTER — Other Ambulatory Visit (HOSPITAL_COMMUNITY): Payer: Medicare Other

## 2019-08-16 DIAGNOSIS — I10 Essential (primary) hypertension: Secondary | ICD-10-CM | POA: Diagnosis not present

## 2019-08-16 DIAGNOSIS — M05849 Other rheumatoid arthritis with rheumatoid factor of unspecified hand: Secondary | ICD-10-CM | POA: Diagnosis not present

## 2019-08-17 ENCOUNTER — Encounter: Payer: Self-pay | Admitting: Nurse Practitioner

## 2019-08-17 ENCOUNTER — Ambulatory Visit: Payer: Medicare Other | Admitting: Nurse Practitioner

## 2019-08-17 ENCOUNTER — Other Ambulatory Visit: Payer: Self-pay

## 2019-08-17 VITALS — BP 144/66 | HR 69 | Temp 96.2°F | Ht 65.0 in | Wt 159.2 lb

## 2019-08-17 DIAGNOSIS — K5900 Constipation, unspecified: Secondary | ICD-10-CM

## 2019-08-17 DIAGNOSIS — R1310 Dysphagia, unspecified: Secondary | ICD-10-CM

## 2019-08-17 DIAGNOSIS — R1319 Other dysphagia: Secondary | ICD-10-CM

## 2019-08-17 DIAGNOSIS — K219 Gastro-esophageal reflux disease without esophagitis: Secondary | ICD-10-CM

## 2019-08-17 MED ORDER — LUBIPROSTONE 24 MCG PO CAPS: 24.0000 ug | ORAL_CAPSULE | Freq: Two times a day (BID) | ORAL | 3 refills | Status: DC

## 2019-08-17 NOTE — Assessment & Plan Note (Signed)
Noted history of chronic GERD with history of recurrent dysphagia.  The patient is currently only using Prilosec "as needed" with rare GERD flares.  Generally her symptoms seem to be well managed.  She has had recurrent dysphagia and is amenable to EGD at this point, as per above.  Recommend she continue her current medications and follow-up in 6 months.  Further recommendations pending endoscopy with dilation.

## 2019-08-17 NOTE — Patient Instructions (Signed)
Your health issues we discussed today were:   Dysphagia (swallowing difficulty) in the setting of GERD (reflux/heartburn): 1. Continue your current medications, including Prilosec 20 mg 2. We will schedule an upper endoscopy with dilation for you 3. Further recommendations will follow your upper endoscopy 4. In the meantime, I am providing eating/chewing recommendations below to help prevent worsening dysphagia 5. Call us if you have any worsening or severe symptoms. 6. If food gets stuck in when I go forward or backward for more than 2 hours then proceed to the emergency room  Constipation, likely due to chronic pain medication: 1. Constipation with chronic pain medicine is very common 2. I have sent a prescription for Amitiza 24 mcg to your pharmacy.  Take this twice a day, with a meal/on a full stomach. 3. Call us if there is a problem obtain the medication from your pharmacy 4. Call us in 1 to 2 weeks regardless and let us know if it is helping you to have better bowel movements  Overall I recommend:  1. Continue your other current medications 2. Return for follow-up in 6 months 3. Call us if you have any questions or concerns   ---------------------------------------------------------------  I am glad you received your COVID-19 vaccinations!  Even though you are fully vaccinated you should continue to wear a mask, socially distance, and wash your hands frequently.  ---------------------------------------------------------------   At [facility] we value your feedback. You may receive a survey about your visit today. Please share your experience as we strive to create trusting relationships with our patients to provide genuine, compassionate, quality care.  We appreciate your understanding and patience as we review any laboratory studies, imaging, and other diagnostic tests that are ordered as we care for you. Our office policy is 5 business days for review of these  results, and any emergent or urgent results are addressed in a timely manner for your best interest. If you do not hear from our office in 1 week, please contact us.   We also encourage the use of MyChart, which contains your medical information for your review as well. If you are not enrolled in this feature, an access code is on this after visit summary for your convenience. Thank you for allowing [place] to be involved in your care.  It was great to see you today!  I hope you have a great Summer!!

## 2019-08-17 NOTE — Progress Notes (Signed)
Referring Provider: Celene Squibb, MD Primary Care Physician:  Celene Squibb, MD Primary GI:  Dr. Gala Romney  Chief Complaint  Patient presents with  . Dysphagia    "gets choked"  . change in bowel    HPI:   [name] is a 79 y.o. female who presents for follow-up on change in bowel habits.  Patient was last seen in our office 01/25/2019 for dysphagia, GERD, altered bowel function.  Received letter recommending follow-up colonoscopy.  2 previous colonoscopies, the last of which was 5 years ago, with diverticulosis and hemorrhoids but no polyps.  She noted she passes "hard balls" daily as far as bowel movement although no bleeding, despite stool softener times 2 at night.  Intermittent GERD on Prilosec "occasionally" with recurrent dysphagia and previous EGD/dilation in the past.  Typically has done well after dilation.  Recommended EGD with possible dilation, start Prilosec 20 mg daily, Benefiber 1 teaspoon daily for 3 weeks and 2 teaspoons daily after that.  EGD initially scheduled for December was changed to 06/16/2019 per patient request.  The patient again called requesting to reschedule and was set for 08/18/2019.  She called one final time to just cancel the procedure and not reschedule.  Today she states she's doing ok overall. Her husband was recently placed to a nursing home/memory care at [facility]. She states he's adjusting well. She's having a harder time with it than he is. She is now wanting to get her EGD scheduled. Still with solid food dysphagia 1-2 times a week, avoids eating by herself due to fear of choking; occasional regurgitation by typically passes with time. Rare GERD flares, only uses Prilosec "as needed." Stools still hard balls with straining. Last week had a really bad spell; tried to have a bowel movement and wouldn't pass, had "yellow gooey, jelly-likes stuff." Felt lightheaded and had to lay down. Bowel movement this morning still consistent with [place] 1. Taking  Benefiber "as needed" because this causes her stools to be runny. She is on hydrocodone bid. Has abdominal pain RLQ, sharp, brief, intermittent. Typically improves or passes with a bowel movement. Denies N/V. She did have scant tissue hematochezia last week x 1 when straining. Denies melena, fever, chills, unintentional weight loss. Denies URI or flu-like symptoms. Denies loss of sense of taste or smell. She has had both COVID-19 vaccine doses. Denies chest pain, dyspnea, dizziness, lightheadedness, syncope, near syncope. Denies any other upper or lower GI symptoms.  Past Medical History:  Diagnosis Date  . Anemia    "little bit"  . Arthritis    back  . Breast cancer ([place])    Right mastectomy  . Bruises easily   . Chronic back pain   . Coronary atherosclerosis of native coronary artery    Nonobstructive at catheterization 2008 - 40% circumflex  . Cystocele 02/20/2015  . Diverticula of colon   . Environmental allergies   . Fitting and adjustment of pessary 05/29/2015   Inserted milex ring with support #2   . GERD (gastroesophageal reflux disease)    doesn't take any meds from this.  Takes tums as neeeded.  . Hematuria 02/20/2015  . History of bronchitis    >69yrs ago  . History of colon polyps   . History of diverticulosis 05/04/2015  . History of migraine    last time a few months ago  . Insomnia    doesn't take any meds  . Kidney infection    history of  . Nocturia   .  Panic attacks    no meds required  . Pelvic pain in female 02/20/2015  . 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 Rectal pain, chronic 05/18/2015  . Seasonal allergies   . Shortness of breath    with exertion  . Urinary frequency   . Urinary urgency     Past Surgical History:  Procedure Laterality Date  . ABDOMINAL HYSTERECTOMY    . BACK SURGERY  1981/2011/2014  . BLADDER SUSPENSION    . BREAST RECONSTRUCTION  2004   with abd tissue  . BREAST SURGERY Right   . CHOLECYSTECTOMY    .  COLONOSCOPY  12/2009   RMR: few pancolonic diverticula. next TCS 12/2014  . COLONOSCOPY  06/14/2007   [id number] rectal polyp, status post cold biopsy removal/ Anal canal hemorrhoids/Left-sided diverticula Colonic mucosa appeared normal.Hyperplastic polyp.   . COLONOSCOPY  01/18/2004   [id number] diverticula/Internal hemorrhoids.  Otherwise normal rectum  . COLONOSCOPY N/A 04/18/2013   Dr. Gala Romney- normal rectum, scattered left sided diverticula the remainder of the colonic mucosa appeared normal  . COLONOSCOPY N/A 08/18/2014   Procedure: COLONOSCOPY;  Surgeon: Daneil Dolin, MD;  [facility];  Service: Endoscopy;  Laterality: N/A;  1000  . ESOPHAGOGASTRODUODENOSCOPY  06/14/2007   [id number] plaques in esophageal mucosa of uncertain significance brushed and biopsied/Normal stomach, normal first duodenum and second duodenoscopy. KOH negative. esophageal bx c/w GERD  . ESOPHAGOGASTRODUODENOSCOPY N/A 08/18/2014   Procedure: ESOPHAGOGASTRODUODENOSCOPY (EGD);  Surgeon: Daneil Dolin, MD;  [facility];  Service: Endoscopy;  Laterality: N/A;  . ESOPHAGOGASTRODUODENOSCOPY (EGD) WITH ESOPHAGEAL DILATION N/A 04/18/2013   Dr. Gala Romney- normal, patent appearing, tubular esophagus, normal gastric mucosa, paptent pylorus, normal first and second portion of the duodenum  . EYE SURGERY     lasik  . FOOT SURGERY Right 2014   d/t plantar fascitis  . GIVENS CAPSULE STUDY N/A 07/18/2013   L.Lewis PAC- markedly abnormal appearing gastric mucosa, sugnificant bile reflux, questionable subucosal small bowel mass versus extrinsic compression in the distal small bowel.- CTE= no small bowel mass or tumor seen.  Marland Kitchen MALONEY DILATION N/A 08/18/2014   Procedure: Venia Minks DILATION;  Surgeon: Daneil Dolin, MD;  [facility];  Service: Endoscopy;  Laterality: N/A;  . MASTECTOMY     right side 12-13 years ago  . TOTAL HIP ARTHROPLASTY Left 02/29/2016  . TOTAL HIP ARTHROPLASTY Left  02/29/2016   Procedure: TOTAL HIP ARTHROPLASTY ANTERIOR APPROACH;  Surgeon: Frederik Pear, MD;  Location: [place];  Service: Orthopedics;  Laterality: Left;  . TOTAL HIP ARTHROPLASTY Right 01/05/2017   Procedure: TOTAL HIP ARTHROPLASTY ANTERIOR APPROACH;  Surgeon: Frederik Pear, MD;  Location: [place];  Service: Orthopedics;  Laterality: Right;  REQUEST 90 MINS    Current Outpatient Medications  Medication Sig Dispense Refill  . amLODipine (NORVASC) 5 MG tablet Take 5 mg by mouth at bedtime.   5  . gabapentin (NEURONTIN) 100 MG capsule Take 100 mg by mouth 3 (three) times daily.    Marland Kitchen HYDROcodone-acetaminophen (NORCO) 10-325 MG tablet Take 1 tablet by mouth 2 (two) times daily as needed.    Marland Kitchen omeprazole (PRILOSEC) 20 MG capsule Take 1 capsule (20 mg total) by mouth daily. (Patient taking differently: Take 20 mg by mouth as needed. ) 30 capsule 11  . Wheat Dextrin (BENEFIBER) POWD Take by mouth as needed.     No current facility-administered medications for this visit.    Allergies as of 08/17/2019 - Review Complete 08/17/2019  Allergen Reaction Noted  . Penicillins Anaphylaxis, Swelling, and Other (See Comments)   . Apixaban Hives 05/06/2016  . Aspirin Other (See Comments)     Family History  Problem Relation Age of Onset  . Coronary artery disease Mother   . Coronary artery disease Father   . Coronary artery disease Brother   . Cancer Sister        lung cancer, two sisters  . Colon cancer Brother        less than age 11  . Lung cancer Sister   . Cancer Brother   . Emphysema Brother     Social History   Socioeconomic History  . Marital status: Married    Spouse name: [name]  . Number of children: 4  . Years of education: 12th  . Highest education level: Not on file  Occupational History    Employer: RETIRED  Tobacco Use  . Smoking status: Former Smoker    Packs/day: 1.50    Years: 30.00    Pack years: 45.00    Types: Cigarettes    Quit date: 04/28/2000    Years since  quitting: 19.3  . Smokeless tobacco: Never Used  . Tobacco comment: quit about 41yrs ago  Substance and Sexual Activity  . Alcohol use: No  . Drug use: No  . Sexual activity: Not Currently    Birth control/protection: Surgical    Comment: hyst  Other Topics Concern  . Not on file  Social History Narrative   Patient lives at home with spouse.   Caffeine Use: 2 cups of coffee daily   Social Determinants of Health   Financial Resource Strain:   . Difficulty of Paying Living Expenses:   Food Insecurity:   . Worried About Charity fundraiser in the Last Year:   . Arboriculturist in the Last Year:   Transportation Needs:   . Film/video editor (Medical):   Marland Kitchen Lack of Transportation (Non-Medical):   Physical Activity:   . Days of Exercise per Week:   . Minutes of Exercise per Session:   Stress:   . Feeling of Stress :   Social Connections:   . Frequency of Communication with Friends and Family:   . Frequency of Social Gatherings with Friends and Family:   . Attends Religious Services:   . Active Member of Clubs or Organizations:   . Attends Archivist Meetings:   Marland Kitchen Marital Status:     Subjective: ROS   Objective: BP (!) 144/66   Pulse 69   Temp (!) 96.2 F (35.7 C) (Temporal)   Ht 5\' 5"  (1.651 m)   Wt 159 lb 3.2 oz (72.2 kg)   BMI 26.49 kg/m  Physical Exam    08/17/2019 9:27 AM   Disclaimer: This note was dictated with voice recognition software. Similar sounding words can inadvertently be transcribed and may not be corrected upon review.

## 2019-08-17 NOTE — Assessment & Plan Note (Signed)
Previous complaints with esophageal dysphagia.  She currently still having symptoms about twice a week.  Previously recommended EGD with possible dilation but this had to be rescheduled a couple times and eventually canceled due to her husband's worsening dementia.  She is now able to schedule her procedure as her husband is now in a memory care unit at a nursing home.  I have discussed dietary recommendations to help prevent worsening dysphagia symptoms.  ER precautions given.  Further printed information in her AVS.  Follow-up in 6 months.  Proceed with EGD on propofol/MAC with Dr. Gala Romney in near future: the risks, benefits, and alternatives have been discussed with the patient in detail. The patient states understanding and desires to proceed.  The patient is currently on hydrocodone and Neurontin. The patient is not on any other anticoagulants, anxiolytics, chronic pain medications, antidepressants, antidiabetics, or iron supplements.  We will plan for the procedure on propofol/MAC to promote adequate sedation.

## 2019-08-17 NOTE — Assessment & Plan Note (Addendum)
Persistent constipation tried and failed MiraLAX, Benefiber, Metamucil.  She is on chronic pain medication and this is likely contributing.  She has hard balls of stool, consistent with [place] 1.  Occasionally if she strains a lot she will have passage of yellowish, gooey substance but no good bowel movement.  At this point I will trial her on Amitiza 24 mcg bid for OIC (per epic alert Movantik is not on the preferred formulary).  We do not currently have samples and we will send a prescription to her pharmacy.  Of asked her to call us if it is too expensive and we can try other options.  I have also asked her to call us in 1 to 2 weeks if she picks up the medication to let us know if it is effective.  We can make further recommendations including changing to different options such as Amitiza, depending on her clinical response.  Follow-up in 6 months.  Colonoscopy about 5 years ago essentially unremarkable.  Previous office visit recommended no further colonoscopy needed.  If she continues to worsen we can consider colonoscopy for further evaluation.

## 2019-08-18 ENCOUNTER — Telehealth: Payer: Self-pay | Admitting: *Deleted

## 2019-08-18 ENCOUNTER — Ambulatory Visit (HOSPITAL_COMMUNITY): Admit: 2019-08-18 | Payer: Medicare Other | Admitting: Internal Medicine

## 2019-08-18 ENCOUNTER — Encounter (HOSPITAL_COMMUNITY): Payer: Self-pay

## 2019-08-18 ENCOUNTER — Other Ambulatory Visit: Payer: Self-pay

## 2019-08-18 SURGERY — ESOPHAGOGASTRODUODENOSCOPY (EGD) WITH PROPOFOL
Anesthesia: Monitor Anesthesia Care

## 2019-08-18 NOTE — Telephone Encounter (Signed)
PA for EGD/-/+DIL submitted via [facility] website. Case approved. PA# [id number], valid 11/01/19-01/30/20.

## 2019-08-18 NOTE — Telephone Encounter (Signed)
Pt called office, procedure scheduled for 11/01/19 at 8:15am. Orders entered.

## 2019-08-18 NOTE — Telephone Encounter (Signed)
Called pt to schedule her EGD +/-dil with propofol with RMR and she states she was busy at the moment and will have to call back later to schedule.

## 2019-08-19 NOTE — Telephone Encounter (Signed)
Pre-op and COVID test 10/28/19. Letter mailed with procedure instructions.

## 2019-09-30 DIAGNOSIS — G2581 Restless legs syndrome: Secondary | ICD-10-CM | POA: Diagnosis not present

## 2019-09-30 DIAGNOSIS — D509 Iron deficiency anemia, unspecified: Secondary | ICD-10-CM | POA: Diagnosis not present

## 2019-09-30 DIAGNOSIS — G4700 Insomnia, unspecified: Secondary | ICD-10-CM | POA: Diagnosis not present

## 2019-09-30 DIAGNOSIS — N390 Urinary tract infection, site not specified: Secondary | ICD-10-CM | POA: Diagnosis not present

## 2019-09-30 DIAGNOSIS — N3000 Acute cystitis without hematuria: Secondary | ICD-10-CM | POA: Diagnosis not present

## 2019-10-24 ENCOUNTER — Telehealth: Payer: Self-pay | Admitting: Internal Medicine

## 2019-10-24 NOTE — Telephone Encounter (Signed)
Called pt and confirmed she wants to cancel. Did not want to r/s at this time. Called endo and made aware. FYI TO EG

## 2019-10-24 NOTE — Telephone Encounter (Signed)
PATIENT CALLED AND WANTS TO CANCEL PROCEDURE   STATED HER HUSBAND HAS PASSED AWAY AND SHE DOES NOT THINK SHE CAN HANDLE THAT RIGHT NOW.

## 2019-10-26 NOTE — Telephone Encounter (Signed)
Noted  

## 2019-10-28 ENCOUNTER — Encounter (HOSPITAL_COMMUNITY): Admission: RE | Admit: 2019-10-28 | Payer: Medicare Other | Source: Ambulatory Visit

## 2019-10-28 ENCOUNTER — Other Ambulatory Visit (HOSPITAL_COMMUNITY): Payer: Medicare Other

## 2019-11-01 ENCOUNTER — Encounter (HOSPITAL_COMMUNITY): Payer: Self-pay

## 2019-11-01 ENCOUNTER — Ambulatory Visit (HOSPITAL_COMMUNITY): Admit: 2019-11-01 | Payer: Medicare Other | Admitting: Internal Medicine

## 2019-11-01 SURGERY — ESOPHAGOGASTRODUODENOSCOPY (EGD) WITH PROPOFOL
Anesthesia: Monitor Anesthesia Care

## 2019-11-11 DIAGNOSIS — G4700 Insomnia, unspecified: Secondary | ICD-10-CM | POA: Diagnosis not present

## 2019-11-11 DIAGNOSIS — M5030 Other cervical disc degeneration, unspecified cervical region: Secondary | ICD-10-CM | POA: Diagnosis not present

## 2019-11-11 DIAGNOSIS — R202 Paresthesia of skin: Secondary | ICD-10-CM | POA: Diagnosis not present

## 2019-11-13 DIAGNOSIS — Z743 Need for continuous supervision: Secondary | ICD-10-CM | POA: Diagnosis not present

## 2019-11-13 DIAGNOSIS — R55 Syncope and collapse: Secondary | ICD-10-CM | POA: Diagnosis not present

## 2019-11-13 DIAGNOSIS — Z888 Allergy status to other drugs, medicaments and biological substances status: Secondary | ICD-10-CM | POA: Diagnosis not present

## 2019-11-13 DIAGNOSIS — R52 Pain, unspecified: Secondary | ICD-10-CM | POA: Diagnosis not present

## 2019-11-13 DIAGNOSIS — Z5329 Procedure and treatment not carried out because of patient's decision for other reasons: Secondary | ICD-10-CM | POA: Diagnosis not present

## 2019-11-13 DIAGNOSIS — R404 Transient alteration of awareness: Secondary | ICD-10-CM | POA: Diagnosis not present

## 2019-11-13 DIAGNOSIS — Z886 Allergy status to analgesic agent status: Secondary | ICD-10-CM | POA: Diagnosis not present

## 2019-11-13 DIAGNOSIS — E860 Dehydration: Secondary | ICD-10-CM | POA: Diagnosis not present

## 2019-11-13 DIAGNOSIS — Z880 Allergy status to penicillin: Secondary | ICD-10-CM | POA: Diagnosis not present

## 2019-11-13 DIAGNOSIS — I509 Heart failure, unspecified: Secondary | ICD-10-CM | POA: Diagnosis not present

## 2019-11-14 ENCOUNTER — Other Ambulatory Visit: Payer: Self-pay | Admitting: Internal Medicine

## 2019-11-14 ENCOUNTER — Other Ambulatory Visit (HOSPITAL_COMMUNITY): Payer: Self-pay | Admitting: Internal Medicine

## 2019-11-14 DIAGNOSIS — M542 Cervicalgia: Secondary | ICD-10-CM

## 2019-11-22 ENCOUNTER — Encounter (HOSPITAL_COMMUNITY): Payer: Self-pay

## 2019-11-22 ENCOUNTER — Ambulatory Visit (HOSPITAL_COMMUNITY): Payer: Medicare Other

## 2019-12-15 ENCOUNTER — Ambulatory Visit
Admission: RE | Admit: 2019-12-15 | Discharge: 2019-12-15 | Disposition: A | Discharge status: Home / Self Care | Payer: Medicare Other | Source: Ambulatory Visit | Attending: Internal Medicine | Admitting: Internal Medicine

## 2019-12-15 ENCOUNTER — Other Ambulatory Visit: Payer: Self-pay

## 2019-12-15 DIAGNOSIS — M4802 Spinal stenosis, cervical region: Secondary | ICD-10-CM | POA: Diagnosis not present

## 2019-12-15 DIAGNOSIS — M542 Cervicalgia: Secondary | ICD-10-CM

## 2019-12-19 DIAGNOSIS — I10 Essential (primary) hypertension: Secondary | ICD-10-CM | POA: Diagnosis not present

## 2019-12-19 DIAGNOSIS — M05849 Other rheumatoid arthritis with rheumatoid factor of unspecified hand: Secondary | ICD-10-CM | POA: Diagnosis not present

## 2019-12-20 DIAGNOSIS — R0602 Shortness of breath: Secondary | ICD-10-CM | POA: Diagnosis not present

## 2019-12-20 DIAGNOSIS — R7301 Impaired fasting glucose: Secondary | ICD-10-CM | POA: Diagnosis not present

## 2019-12-20 DIAGNOSIS — R9431 Abnormal electrocardiogram [ECG] [EKG]: Secondary | ICD-10-CM | POA: Diagnosis not present

## 2019-12-20 DIAGNOSIS — R944 Abnormal results of kidney function studies: Secondary | ICD-10-CM | POA: Diagnosis not present

## 2019-12-20 DIAGNOSIS — G2581 Restless legs syndrome: Secondary | ICD-10-CM | POA: Diagnosis not present

## 2019-12-20 DIAGNOSIS — R202 Paresthesia of skin: Secondary | ICD-10-CM | POA: Diagnosis not present

## 2019-12-29 ENCOUNTER — Encounter: Payer: Self-pay | Admitting: *Deleted

## 2019-12-29 ENCOUNTER — Ambulatory Visit: Payer: Medicare Other | Admitting: Cardiology

## 2019-12-29 ENCOUNTER — Other Ambulatory Visit: Payer: Self-pay

## 2019-12-29 ENCOUNTER — Encounter: Payer: Self-pay | Admitting: Cardiology

## 2019-12-29 VITALS — BP 118/82 | HR 68 | Ht 65.5 in | Wt 160.0 lb

## 2019-12-29 DIAGNOSIS — I25119 Atherosclerotic heart disease of native coronary artery with unspecified angina pectoris: Secondary | ICD-10-CM

## 2019-12-29 DIAGNOSIS — Z87898 Personal history of other specified conditions: Secondary | ICD-10-CM

## 2019-12-29 DIAGNOSIS — R0602 Shortness of breath: Secondary | ICD-10-CM

## 2019-12-29 DIAGNOSIS — R0609 Other forms of dyspnea: Secondary | ICD-10-CM

## 2019-12-29 DIAGNOSIS — R0600 Dyspnea, unspecified: Secondary | ICD-10-CM | POA: Diagnosis not present

## 2019-12-29 DIAGNOSIS — I10 Essential (primary) hypertension: Secondary | ICD-10-CM

## 2019-12-29 NOTE — Patient Instructions (Addendum)
Your physician recommends that you schedule a follow-up appointment in: PENDING WITH DR MCDOWELL  Your physician recommends that you continue on your current medications as directed. Please refer to the Current Medication list given to you today.  Your physician has requested that you have an echocardiogram. Echocardiography is a painless test that uses sound waves to create images of your heart. It provides your doctor with information about the size and shape of your heart and how well your heart's chambers and valves are working. This procedure takes approximately one hour. There are no restrictions for this procedure.  Your physician has requested that you have a lexiscan myoview. For further information please visit [email or web address]. Please follow instruction sheet, as given.  Thank you for choosing [facility]!!     

## 2019-12-29 NOTE — Progress Notes (Signed)
Cardiology Office Note  Date: 12/29/2019   ID: Melinda, Gould [birth year], MRN [id number]  PCP:  Celene Squibb, MD  Cardiologist:  Rozann Lesches, MD Electrophysiologist:  None   Chief Complaint  Patient presents with   Shortness of Breath    History of Present Illness: [name] is a 79 y.o. female referred for cardiology consultation by Dr. Lorriane Shire at [facility] ER after encounter in July with reported worsening shortness of breath and also an episode of apparent syncope.  I reviewed the available records.  She had no clear evidence of ACS, no definite arrhythmias were noted while on telemetry, she was not orthostatic, BNP was elevated at 1500 however she had no acute findings by chest x-ray.  She left AMA.  She tells me that for the last month she has been experiencing dyspnea on exertion with typical ADLs, walking to the mailbox elicits symptoms, also not able to do her usual stretching exercises in the evenings.  She has been fatigued with activity, no definite chest pain.  She describes the episode of syncope as occurring while she was walking outside with her son, she felt very short of breath and as if she could not go any further, had to sit down, and then apparently blacked out.  No associated palpitations or chest pain.  She was last seen in our practice back in 2013.  She does have a history of nonobstructive CAD documented in 2008, exercise echocardiogram in 2013 showed no echocardiographic evidence of ischemia.  She states that her husband passed away back in Sep 28, 2022.  Past Medical History:  Diagnosis Date   Anemia    Arthritis    Breast cancer ([place])    Right mastectomy   Chronic back pain    Coronary atherosclerosis of native coronary artery    Nonobstructive at catheterization 2008 - 40% circumflex   Environmental allergies    Essential hypertension    GERD (gastroesophageal reflux disease)    History of bronchitis    History of colon polyps      History of diverticulosis    History of migraine    Panic attacks    Prolapse of vaginal vault after hysterectomy    Seasonal allergies    Urinary frequency     Past Surgical History:  Procedure Laterality Date   ABDOMINAL HYSTERECTOMY     BACK SURGERY  1981/2011/2014   BLADDER SUSPENSION     BREAST RECONSTRUCTION  2004   with abd tissue   BREAST SURGERY Right    CHOLECYSTECTOMY     COLONOSCOPY  12/2009   RMR: few pancolonic diverticula. next TCS 12/2014   COLONOSCOPY  06/14/2007   KNL:ZJQBHALPFX rectal polyp, status post cold biopsy removal/ Anal canal hemorrhoids/Left-sided diverticula Colonic mucosa appeared normal.Hyperplastic polyp.    COLONOSCOPY  01/18/2004   TKW:IOXBDZH diverticula/Internal hemorrhoids.  Otherwise normal rectum   COLONOSCOPY N/A 04/18/2013   Dr. Gala Romney- normal rectum, scattered left sided diverticula the remainder of the colonic mucosa appeared normal   COLONOSCOPY N/A 08/18/2014   Procedure: COLONOSCOPY;  Surgeon: Daneil Dolin, MD;  [facility];  Service: Endoscopy;  Laterality: N/A;  1000   ESOPHAGOGASTRODUODENOSCOPY  06/14/2007   GDJ:MEQAST-MHDQ plaques in esophageal mucosa of uncertain significance brushed and biopsied/Normal stomach, normal first duodenum and second duodenoscopy. KOH negative. esophageal bx c/w GERD   ESOPHAGOGASTRODUODENOSCOPY N/A 08/18/2014   Procedure: ESOPHAGOGASTRODUODENOSCOPY (EGD);  Surgeon: Daneil Dolin, MD;  [facility];  Service: Endoscopy;  Laterality: N/A;   ESOPHAGOGASTRODUODENOSCOPY (EGD) WITH ESOPHAGEAL DILATION N/A 04/18/2013   Dr. Gala Romney- normal, patent appearing, tubular esophagus, normal gastric mucosa, paptent pylorus, normal first and second portion of the duodenum   EYE SURGERY     lasik   FOOT SURGERY Right 2014   d/t plantar fascitis   GIVENS CAPSULE STUDY N/A 07/18/2013   L.Lewis PAC- markedly abnormal appearing gastric mucosa, sugnificant bile reflux,  questionable subucosal small bowel mass versus extrinsic compression in the distal small bowel.- CTE= no small bowel mass or tumor seen.   MALONEY DILATION N/A 08/18/2014   Procedure: Venia Minks DILATION;  Surgeon: Daneil Dolin, MD;  [facility];  Service: Endoscopy;  Laterality: N/A;   MASTECTOMY     right side 12-13 years ago   TOTAL HIP ARTHROPLASTY Left 02/29/2016   TOTAL HIP ARTHROPLASTY Left 02/29/2016   Procedure: TOTAL HIP ARTHROPLASTY ANTERIOR APPROACH;  Surgeon: Frederik Pear, MD;  Location: [place];  Service: Orthopedics;  Laterality: Left;   TOTAL HIP ARTHROPLASTY Right 01/05/2017   Procedure: TOTAL HIP ARTHROPLASTY ANTERIOR APPROACH;  Surgeon: Frederik Pear, MD;  Location: [place];  Service: Orthopedics;  Laterality: Right;  REQUEST 90 MINS    Current Outpatient Medications  Medication Sig Dispense Refill   amLODipine (NORVASC) 5 MG tablet Take 5 mg by mouth at bedtime.   5   diphenhydrAMINE (BENADRYL) 25 MG tablet Take 25 mg by mouth daily as needed for allergies.     diphenhydrAMINE HCl (ZZZQUIL) 50 MG/30ML LIQD Take 50 mg by mouth at bedtime as needed (sleep).     Docusate Calcium (STOOL SOFTENER PO) Take 1 tablet by mouth daily as needed (constipation).     HYDROcodone-acetaminophen (NORCO) 10-325 MG tablet Take 1 tablet by mouth 2 (two) times daily as needed for moderate pain.      sertraline (ZOLOFT) 25 MG tablet Take 12.5 mg by mouth daily.      No current facility-administered medications for this visit.   Allergies:  Penicillins, Apixaban, and Aspirin   Social History: The patient  reports that she quit smoking about 19 years ago. Her smoking use included cigarettes. She has a 45.00 pack-year smoking history. She has never used smokeless tobacco. She reports that she does not drink alcohol and does not use drugs.   Family History: The patient's family history includes Cancer in her brother and sister; Colon cancer in her brother; Coronary artery disease in  her brother, father, and mother; Emphysema in her brother; Lung cancer in her sister.   ROS:   No orthopnea or PND.  Physical Exam: VS:  BP 118/82    Pulse 68    Ht 5' 5.5" (1.664 m)    Wt 160 lb (72.6 kg)    SpO2 98%    BMI 26.22 kg/m , BMI Body mass index is 26.22 kg/m.  Wt Readings from Last 3 Encounters:  12/29/19 160 lb (72.6 kg)  08/17/19 159 lb 3.2 oz (72.2 kg)  01/25/19 166 lb 12.8 oz (75.7 kg)    General: Patient appears comfortable at rest. HEENT: Conjunctiva and lids normal, wearing a mask. Neck: Supple, no elevated JVP or carotid bruits, no thyromegaly. Lungs: Clear to auscultation, nonlabored breathing at rest. Cardiac: Regular rate and rhythm, no S3, 2/6 systolic murmur, no pericardial rub. Abdomen: Soft, nontender, bowel sounds present. Extremities: No pitting edema, distal pulses 2+. Skin: Warm and dry. Musculoskeletal: No kyphosis. Neuropsychiatric: Alert and oriented x3, affect grossly appropriate.  ECG:  An ECG dated  02/18/2016 was personally reviewed today and demonstrated:  Sinus rhythm with PAC.  Recent Labwork:  July 2021: Urinalysis with small leukocyte esterase, positive nitrites, 20 WBC enlargement of bacteria; hemoglobin 11.4, platelets 273, D-dimer 0.73, high-sensitivity troponin I 12, pro-BNP 1556, potassium 4.3, BUN 25, creatinine 1.59  Other Studies Reviewed Today:  Exercise echocardiogram 01/27/2012: Study Conclusions   - Stress: There was resting hypertension with a hypertensive  response to stress.  - Stress ECG conclusions: Inconsistent, equivocal ST segment  abnomalities (upsloping depression 0.5-1.0 mm) noted leads  II, III, aVF, V4-V6 near peak. The stress ECG was  equivocal for ischemia.  - Staged echo: There was no echocardiographic evidence for  stress-induced ischemia.   Chest x-ray 11/13/2019 [facility]): PORTABLE CHEST 1 VIEW COMPARISON: February 18, 2016 FINDINGS: Cardiomediastinal silhouette is normal. Mediastinal  contours appear intact. Calcific atherosclerotic disease of the aorta. There is no evidence of focal airspace consolidation, pleural effusion or pneumothorax. Osseous structures are without acute abnormality. Postsurgical changes of the right breast/chest wall.  1. No active disease.  2. Calcific atherosclerotic disease of the aorta.  Assessment and Plan:  1.  Progressive dyspnea on exertion over the last month.  Also fatigue with activity but no definite chest pain.  Episode of syncope occurred when she was walking and felt short of breath at the time.  She does have a history of nonobstructive CAD by cardiac catheterization in 2008, nonischemic exercise echocardiogram in 2013.  I reviewed her recent lab work and test results from [place] visit at [facility] in July.  Plan to proceed with an echocardiogram for cardiac structural assessment and a Lexiscan Myoview for repeat ischemic evaluation.  2.  Essential hypertension, currently on Norvasc.  Blood pressure is well controlled today.  Medication Adjustments/Labs and Tests Ordered: Current medicines are reviewed at length with the patient today.  Concerns regarding medicines are outlined above.   Tests Ordered: Orders Placed This Encounter  Procedures   NM Myocar Multi W/Spect W/Wall Motion / EF   ECHOCARDIOGRAM COMPLETE    Medication Changes: No orders of the defined types were placed in this encounter.   Disposition:  Follow up test results and determine next step.  Signed, Satira Sark, MD, [facility] 12/29/2019 1:53 PM    [place] [facility] [place], [place],  [address] Phone: [phone]; Fax: [phone]

## 2019-12-29 NOTE — H&P (View-Only) (Signed)
Cardiology Office Note  Date: 12/29/2019   ID: Ciel, Chervenak [birth year], MRN [id number]  PCP:  Celene Squibb, MD  Cardiologist:  Rozann Lesches, MD Electrophysiologist:  None   Chief Complaint  Patient presents with  . Shortness of Breath    History of Present Illness: [name] is a 79 y.o. female referred for cardiology consultation by Dr. Lorriane Shire at [facility] ER after encounter in July with reported worsening shortness of breath and also an episode of apparent syncope.  I reviewed the available records.  She had no clear evidence of ACS, no definite arrhythmias were noted while on telemetry, she was not orthostatic, BNP was elevated at 1500 however she had no acute findings by chest x-ray.  She left AMA.  She tells me that for the last month she has been experiencing dyspnea on exertion with typical ADLs, walking to the mailbox elicits symptoms, also not able to do her usual stretching exercises in the evenings.  She has been fatigued with activity, no definite chest pain.  She describes the episode of syncope as occurring while she was walking outside with her son, she felt very short of breath and as if she could not go any further, had to sit down, and then apparently blacked out.  No associated palpitations or chest pain.  She was last seen in our practice back in 2013.  She does have a history of nonobstructive CAD documented in 2008, exercise echocardiogram in 2013 showed no echocardiographic evidence of ischemia.  She states that her husband passed away back in Sep 19, 2022.  Past Medical History:  Diagnosis Date  . Anemia   . Arthritis   . Breast cancer ([place])    Right mastectomy  . Chronic back pain   . Coronary atherosclerosis of native coronary artery    Nonobstructive at catheterization 2008 - 40% circumflex  . Environmental allergies   . Essential hypertension   . GERD (gastroesophageal reflux disease)   . History of bronchitis   . History of colon polyps     . History of diverticulosis   . History of migraine   . Panic attacks   . Prolapse of vaginal vault after hysterectomy   . Seasonal allergies   . Urinary frequency     Past Surgical History:  Procedure Laterality Date  . ABDOMINAL HYSTERECTOMY    . BACK SURGERY  1981/2011/2014  . BLADDER SUSPENSION    . BREAST RECONSTRUCTION  2004   with abd tissue  . BREAST SURGERY Right   . CHOLECYSTECTOMY    . COLONOSCOPY  12/2009   RMR: few pancolonic diverticula. next TCS 12/2014  . COLONOSCOPY  06/14/2007   XTG:GYIRSWNIOE rectal polyp, status post cold biopsy removal/ Anal canal hemorrhoids/Left-sided diverticula Colonic mucosa appeared normal.Hyperplastic polyp.   . COLONOSCOPY  01/18/2004   VOJ:JKKXFGH diverticula/Internal hemorrhoids.  Otherwise normal rectum  . COLONOSCOPY N/A 04/18/2013   Dr. Gala Romney- normal rectum, scattered left sided diverticula the remainder of the colonic mucosa appeared normal  . COLONOSCOPY N/A 08/18/2014   Procedure: COLONOSCOPY;  Surgeon: Daneil Dolin, MD;  [facility];  Service: Endoscopy;  Laterality: N/A;  1000  . ESOPHAGOGASTRODUODENOSCOPY  06/14/2007   WEX:HBZJIR-CVEL plaques in esophageal mucosa of uncertain significance brushed and biopsied/Normal stomach, normal first duodenum and second duodenoscopy. KOH negative. esophageal bx c/w GERD  . ESOPHAGOGASTRODUODENOSCOPY N/A 08/18/2014   Procedure: ESOPHAGOGASTRODUODENOSCOPY (EGD);  Surgeon: Daneil Dolin, MD;  [facility];  Service: Endoscopy;  Laterality: N/A;  . ESOPHAGOGASTRODUODENOSCOPY (EGD) WITH ESOPHAGEAL DILATION N/A 04/18/2013   Dr. Gala Romney- normal, patent appearing, tubular esophagus, normal gastric mucosa, paptent pylorus, normal first and second portion of the duodenum  . EYE SURGERY     lasik  . FOOT SURGERY Right 2014   d/t plantar fascitis  . GIVENS CAPSULE STUDY N/A 07/18/2013   L.Lewis PAC- markedly abnormal appearing gastric mucosa, sugnificant bile reflux,  questionable subucosal small bowel mass versus extrinsic compression in the distal small bowel.- CTE= no small bowel mass or tumor seen.  Marland Kitchen MALONEY DILATION N/A 08/18/2014   Procedure: Venia Minks DILATION;  Surgeon: Daneil Dolin, MD;  [facility];  Service: Endoscopy;  Laterality: N/A;  . MASTECTOMY     right side 12-13 years ago  . TOTAL HIP ARTHROPLASTY Left 02/29/2016  . TOTAL HIP ARTHROPLASTY Left 02/29/2016   Procedure: TOTAL HIP ARTHROPLASTY ANTERIOR APPROACH;  Surgeon: Frederik Pear, MD;  Location: [place];  Service: Orthopedics;  Laterality: Left;  . TOTAL HIP ARTHROPLASTY Right 01/05/2017   Procedure: TOTAL HIP ARTHROPLASTY ANTERIOR APPROACH;  Surgeon: Frederik Pear, MD;  Location: [place];  Service: Orthopedics;  Laterality: Right;  REQUEST 90 MINS    Current Outpatient Medications  Medication Sig Dispense Refill  . amLODipine (NORVASC) 5 MG tablet Take 5 mg by mouth at bedtime.   5  . diphenhydrAMINE (BENADRYL) 25 MG tablet Take 25 mg by mouth daily as needed for allergies.    . diphenhydrAMINE HCl (ZZZQUIL) 50 MG/30ML LIQD Take 50 mg by mouth at bedtime as needed (sleep).    Mariane Baumgarten Calcium (STOOL SOFTENER PO) Take 1 tablet by mouth daily as needed (constipation).    Marland Kitchen HYDROcodone-acetaminophen (NORCO) 10-325 MG tablet Take 1 tablet by mouth 2 (two) times daily as needed for moderate pain.     Marland Kitchen sertraline (ZOLOFT) 25 MG tablet Take 12.5 mg by mouth daily.      No current facility-administered medications for this visit.   Allergies:  Penicillins, Apixaban, and Aspirin   Social History: The patient  reports that she quit smoking about 19 years ago. Her smoking use included cigarettes. She has a 45.00 pack-year smoking history. She has never used smokeless tobacco. She reports that she does not drink alcohol and does not use drugs.   Family History: The patient's family history includes Cancer in her brother and sister; Colon cancer in her brother; Coronary artery disease in  her brother, father, and mother; Emphysema in her brother; Lung cancer in her sister.   ROS:   No orthopnea or PND.  Physical Exam: VS:  BP 118/82   Pulse 68   Ht 5' 5.5" (1.664 m)   Wt 160 lb (72.6 kg)   SpO2 98%   BMI 26.22 kg/m , BMI Body mass index is 26.22 kg/m.  Wt Readings from Last 3 Encounters:  12/29/19 160 lb (72.6 kg)  08/17/19 159 lb 3.2 oz (72.2 kg)  01/25/19 166 lb 12.8 oz (75.7 kg)    General: Patient appears comfortable at rest. HEENT: Conjunctiva and lids normal, wearing a mask. Neck: Supple, no elevated JVP or carotid bruits, no thyromegaly. Lungs: Clear to auscultation, nonlabored breathing at rest. Cardiac: Regular rate and rhythm, no S3, 2/6 systolic murmur, no pericardial rub. Abdomen: Soft, nontender, bowel sounds present. Extremities: No pitting edema, distal pulses 2+. Skin: Warm and dry. Musculoskeletal: No kyphosis. Neuropsychiatric: Alert and oriented x3, affect grossly appropriate.  ECG:  An ECG dated 02/18/2016 was personally reviewed today  and demonstrated:  Sinus rhythm with PAC.  Recent Labwork:  July 2021: Urinalysis with small leukocyte esterase, positive nitrites, 20 WBC enlargement of bacteria; hemoglobin 11.4, platelets 273, D-dimer 0.73, high-sensitivity troponin I 12, pro-BNP 1556, potassium 4.3, BUN 25, creatinine 1.59  Other Studies Reviewed Today:  Exercise echocardiogram 01/27/2012: Study Conclusions   - Stress: There was resting hypertension with a hypertensive  response to stress.  - Stress ECG conclusions: Inconsistent, equivocal ST segment  abnomalities (upsloping depression 0.5-1.0 mm) noted leads  II, III, aVF, V4-V6 near peak. The stress ECG was  equivocal for ischemia.  - Staged echo: There was no echocardiographic evidence for  stress-induced ischemia.   Chest x-ray 11/13/2019 [facility]): PORTABLE CHEST 1 VIEW COMPARISON: February 18, 2016 FINDINGS: Cardiomediastinal silhouette is normal. Mediastinal  contours appear intact. Calcific atherosclerotic disease of the aorta. There is no evidence of focal airspace consolidation, pleural effusion or pneumothorax. Osseous structures are without acute abnormality. Postsurgical changes of the right breast/chest wall.  1. No active disease.  2. Calcific atherosclerotic disease of the aorta.  Assessment and Plan:  1.  Progressive dyspnea on exertion over the last month.  Also fatigue with activity but no definite chest pain.  Episode of syncope occurred when she was walking and felt short of breath at the time.  She does have a history of nonobstructive CAD by cardiac catheterization in 2008, nonischemic exercise echocardiogram in 2013.  I reviewed her recent lab work and test results from [place] visit at [facility] in July.  Plan to proceed with an echocardiogram for cardiac structural assessment and a Lexiscan Myoview for repeat ischemic evaluation.  2.  Essential hypertension, currently on Norvasc.  Blood pressure is well controlled today.  Medication Adjustments/Labs and Tests Ordered: Current medicines are reviewed at length with the patient today.  Concerns regarding medicines are outlined above.   Tests Ordered: Orders Placed This Encounter  Procedures  . NM Myocar Multi W/Spect W/Wall Motion / EF  . ECHOCARDIOGRAM COMPLETE    Medication Changes: No orders of the defined types were placed in this encounter.   Disposition:  Follow up test results and determine next step.  Signed, Satira Sark, MD, [facility] 12/29/2019 1:53 PM    [place] at [place], [place], [place] [address] Phone: [phone]; Fax: [phone]

## 2020-01-06 ENCOUNTER — Encounter (HOSPITAL_COMMUNITY)
Admission: RE | Admit: 2020-01-06 | Discharge: 2020-01-06 | Disposition: A | Discharge status: Home / Self Care | Payer: Medicare Other | Source: Ambulatory Visit | Attending: Cardiology | Admitting: Cardiology

## 2020-01-06 ENCOUNTER — Ambulatory Visit (HOSPITAL_COMMUNITY)
Admission: RE | Admit: 2020-01-06 | Discharge: 2020-01-06 | Disposition: A | Discharge status: Home / Self Care | Payer: Medicare Other | Source: Ambulatory Visit | Attending: Cardiology | Admitting: Cardiology

## 2020-01-06 ENCOUNTER — Other Ambulatory Visit: Payer: Self-pay

## 2020-01-06 ENCOUNTER — Encounter (HOSPITAL_BASED_OUTPATIENT_CLINIC_OR_DEPARTMENT_OTHER)
Admission: RE | Admit: 2020-01-06 | Discharge: 2020-01-06 | Disposition: A | Discharge status: Home / Self Care | Payer: Medicare Other | Source: Ambulatory Visit | Attending: Cardiology | Admitting: Cardiology

## 2020-01-06 ENCOUNTER — Encounter (HOSPITAL_COMMUNITY): Payer: Self-pay

## 2020-01-06 DIAGNOSIS — R0609 Other forms of dyspnea: Secondary | ICD-10-CM

## 2020-01-06 DIAGNOSIS — R0600 Dyspnea, unspecified: Secondary | ICD-10-CM | POA: Insufficient documentation

## 2020-01-06 LAB — NM MYOCAR MULTI W/SPECT W/WALL MOTION / EF
LV dias vol: 70 mL (ref 46–106)
LV sys vol: 24 mL
Peak HR: 79 {beats}/min
RATE: 0.4
Rest HR: 57 {beats}/min
SDS: 2
SRS: 3
SSS: 5
TID: 1.31

## 2020-01-06 LAB — ECHOCARDIOGRAM COMPLETE
AR max vel: 3.19 cm2
AV Area VTI: 2.87 cm2
AV Area mean vel: 3.27 cm2
AV Mean grad: 3 mmHg
AV Peak grad: 7.2 mmHg
Ao pk vel: 1.34 m/s
Area-P 1/2: 2.09 cm2
S' Lateral: 2.86 cm

## 2020-01-06 MED ORDER — REGADENOSON 0.4 MG/5ML IV SOLN
INTRAVENOUS | Status: AC
  Administered 2020-01-06: 0.4 mg via INTRAVENOUS
  Filled 2020-01-06: qty 5

## 2020-01-06 MED ORDER — TECHNETIUM TC 99M TETROFOSMIN IV KIT
30.0000 | PACK | Freq: Once | INTRAVENOUS | Status: AC
  Administered 2020-01-06: 28.2 via INTRAVENOUS

## 2020-01-06 MED ORDER — SODIUM CHLORIDE FLUSH 0.9 % IV SOLN
INTRAVENOUS | Status: AC
  Administered 2020-01-06: 10 mL via INTRAVENOUS
  Filled 2020-01-06: qty 10

## 2020-01-06 MED ORDER — TECHNETIUM TC 99M TETROFOSMIN IV KIT
10.0000 | PACK | Freq: Once | INTRAVENOUS | Status: AC | PRN
  Administered 2020-01-06: 9.5 via INTRAVENOUS

## 2020-01-06 NOTE — Progress Notes (Signed)
*  PRELIMINARY RESULTS* Echocardiogram 2D Echocardiogram has been performed.  Samuel Germany 01/06/2020, 12:29 PM

## 2020-01-10 ENCOUNTER — Telehealth: Payer: Self-pay | Admitting: *Deleted

## 2020-01-10 NOTE — Telephone Encounter (Signed)
-----   Message from Melinda Sark, MD sent at 01/06/2020  4:40 PM EDT ----- Results reviewed.  Stress test although not showing a focal large area of ischemia, does show potential evidence of more diffuse subendocardial ischemia which could sometimes be seen with multivessel CAD.  LVEF is normal.  In light of her symptoms reported at recent consultation, I would generally suggest proceeding with a cardiac catheterization to reevaluate coronary anatomy although she had nonobstructive disease several years ago.  Please arrange a follow-up next week with Jonni Sanger to get procedure set up if she is in agreement.

## 2020-01-10 NOTE — Telephone Encounter (Signed)
Pt voiced understanding - will come Thursday for EKG and discussed with Katina Dung, NP and will discuss risks and benefits with pt at that time - will scheduled cath/covid test while pt is here     Satira Sark, MD sent to Merlene Laughter, RN Results reviewed. LVEF is vigorous at 65 to 70%, also normal RV contraction. No major valvular abnormalities to explain symptoms. Await stress test results

## 2020-01-12 ENCOUNTER — Telehealth: Payer: Self-pay | Admitting: *Deleted

## 2020-01-12 ENCOUNTER — Ambulatory Visit (INDEPENDENT_AMBULATORY_CARE_PROVIDER_SITE_OTHER): Payer: Medicare Other | Admitting: *Deleted

## 2020-01-12 ENCOUNTER — Other Ambulatory Visit: Payer: Self-pay

## 2020-01-12 ENCOUNTER — Encounter: Payer: Self-pay | Admitting: *Deleted

## 2020-01-12 DIAGNOSIS — R0600 Dyspnea, unspecified: Secondary | ICD-10-CM

## 2020-01-12 DIAGNOSIS — R0609 Other forms of dyspnea: Secondary | ICD-10-CM

## 2020-01-12 NOTE — Progress Notes (Signed)
Presents to office for EKG and to discuss heart cath

## 2020-01-12 NOTE — Telephone Encounter (Signed)
Pt contacted pre-catheterization scheduled at [facility] for: Monday January 16, 2020 10:30 AM Verified arrival time and place: [facility]) at: 8:30 AM   No solid food after midnight prior to cath, clear liquids until 5 AM day of procedure.  AM meds can be  taken pre-cath with sips of water including: ASA 81 mg   Confirmed patient has responsible adult to drive home post procedure and be with patient first 24 hours after arriving home: yes  You are allowed ONE visitor in the waiting room during the time you are at the hospital for your procedure. Both you and your visitor must wear a mask once you enter the hospital.       COVID-19 Pre-Screening Questions:  . In the past 10 days have you had a new cough, shortness of breath, headache, congestion, fever (100 or greater) unexplained body aches, new sore throat, or sudden loss of taste or sense of smell? no . In the past 10 days have you been around anyone with known Covid 19? no . Have you been vaccinated for COVID-19? Yes, see immunization history   Reviewed procedure/mask/visitor instructions, COVID-19 questions with patient.

## 2020-01-12 NOTE — Progress Notes (Signed)
Patient is in today for nursing visit and EKG and and to have informed consent prior to scheduled cardiac catheterization.  Cardiac catheterization schedule for September 17 with Dr. Angelena Form at 3:50 PM.  I discussed the risk and benefits of cardiac catheterization including risk of bleeding, infection, hematoma, contrast allergy, possible kidney injury secondary to contrast, dissection of aorta and possible coronary artery dissection, possible retroperitoneal bleed, MI, and death.  Benefits were discussed.  Including finding possible coronary artery disease which may in need to be stented and/or other interventions which could improve blood flow through the coronary arteries including coronary artery bypass.  Patient verbalized understanding and is aware of the risk and benefits

## 2020-01-12 NOTE — Progress Notes (Signed)
Pt scheduled for [facility] 9/16 @ 1030am with Dr Angelena Form Covid test scheduled for 9/17 @ 350pm pt will have labs done before covid test

## 2020-01-13 ENCOUNTER — Other Ambulatory Visit (HOSPITAL_COMMUNITY)
Admission: RE | Admit: 2020-01-13 | Discharge: 2020-01-13 | Disposition: A | Discharge status: Home / Self Care | Payer: Medicare Other | Source: Ambulatory Visit | Attending: Cardiology | Admitting: Cardiology

## 2020-01-13 ENCOUNTER — Other Ambulatory Visit: Payer: Self-pay

## 2020-01-13 ENCOUNTER — Telehealth: Payer: Self-pay | Admitting: *Deleted

## 2020-01-13 DIAGNOSIS — R0600 Dyspnea, unspecified: Secondary | ICD-10-CM | POA: Insufficient documentation

## 2020-01-13 DIAGNOSIS — Z01812 Encounter for preprocedural laboratory examination: Secondary | ICD-10-CM | POA: Diagnosis not present

## 2020-01-13 DIAGNOSIS — Z20822 Contact with and (suspected) exposure to covid-19: Secondary | ICD-10-CM | POA: Diagnosis not present

## 2020-01-13 LAB — CBC
HCT: 33.8 % — ABNORMAL LOW (ref 36.0–46.0)
Hemoglobin: 11 g/dL — ABNORMAL LOW (ref 12.0–15.0)
MCH: 30.9 pg (ref 26.0–34.0)
MCHC: 32.5 g/dL (ref 30.0–36.0)
MCV: 94.9 fL (ref 80.0–100.0)
Platelets: 294 10*3/uL (ref 150–400)
RBC: 3.56 MIL/uL — ABNORMAL LOW (ref 3.87–5.11)
RDW: 13.2 % (ref 11.5–15.5)
WBC: 8.3 10*3/uL (ref 4.0–10.5)
nRBC: 0 % (ref 0.0–0.2)

## 2020-01-13 LAB — BASIC METABOLIC PANEL
Anion gap: 9 (ref 5–15)
BUN: 22 mg/dL (ref 8–23)
CO2: 24 mmol/L (ref 22–32)
Calcium: 9.6 mg/dL (ref 8.9–10.3)
Chloride: 103 mmol/L (ref 98–111)
Creatinine, Ser: 1.2 mg/dL — ABNORMAL HIGH (ref 0.44–1.00)
GFR calc Af Amer: 50 mL/min — ABNORMAL LOW (ref >60)
GFR calc non Af Amer: 43 mL/min — ABNORMAL LOW (ref >60)
Glucose, Bld: 112 mg/dL — ABNORMAL HIGH (ref 70–99)
Potassium: 4 mmol/L (ref 3.5–5.1)
Sodium: 136 mmol/L (ref 135–145)

## 2020-01-13 NOTE — Telephone Encounter (Signed)
Cath lab contacted with update and Patient informed and verbalized understanding of plan. Patient will be at hospital at 6:30 am. Says she is unable to get there any sooner.

## 2020-01-13 NOTE — Telephone Encounter (Signed)
-----   Message from Satira Sark, MD sent at 01/13/2020  2:54 PM EDT ----- Results reviewed.  Pre cardiac catheterization creatinine 1.2 with GFR 43.  Refer to preprocedure hydration recommendations in the orders.

## 2020-01-14 LAB — SARS CORONAVIRUS 2 (TAT 6-24 HRS): SARS Coronavirus 2: NEGATIVE

## 2020-01-16 ENCOUNTER — Ambulatory Visit (HOSPITAL_COMMUNITY)
Admission: RE | Admit: 2020-01-16 | Discharge: 2020-01-16 | Disposition: A | Discharge status: Home / Self Care | Payer: Medicare Other | Attending: Cardiovascular Disease | Admitting: Cardiovascular Disease

## 2020-01-16 ENCOUNTER — Encounter (HOSPITAL_COMMUNITY): Payer: Self-pay | Admitting: Cardiovascular Disease

## 2020-01-16 ENCOUNTER — Encounter (HOSPITAL_COMMUNITY)
Admission: RE | Disposition: A | Discharge status: Home / Self Care | Payer: Self-pay | Source: Home / Self Care | Attending: Cardiovascular Disease

## 2020-01-16 ENCOUNTER — Other Ambulatory Visit: Payer: Self-pay

## 2020-01-16 DIAGNOSIS — Z853 Personal history of malignant neoplasm of breast: Secondary | ICD-10-CM | POA: Diagnosis not present

## 2020-01-16 DIAGNOSIS — I10 Essential (primary) hypertension: Secondary | ICD-10-CM | POA: Diagnosis not present

## 2020-01-16 DIAGNOSIS — R0609 Other forms of dyspnea: Secondary | ICD-10-CM | POA: Diagnosis not present

## 2020-01-16 DIAGNOSIS — Z8249 Family history of ischemic heart disease and other diseases of the circulatory system: Secondary | ICD-10-CM | POA: Insufficient documentation

## 2020-01-16 DIAGNOSIS — K219 Gastro-esophageal reflux disease without esophagitis: Secondary | ICD-10-CM | POA: Diagnosis not present

## 2020-01-16 DIAGNOSIS — I700 Atherosclerosis of aorta: Secondary | ICD-10-CM | POA: Diagnosis not present

## 2020-01-16 DIAGNOSIS — Z886 Allergy status to analgesic agent status: Secondary | ICD-10-CM | POA: Diagnosis not present

## 2020-01-16 DIAGNOSIS — Z880 Allergy status to penicillin: Secondary | ICD-10-CM | POA: Insufficient documentation

## 2020-01-16 DIAGNOSIS — Z79899 Other long term (current) drug therapy: Secondary | ICD-10-CM | POA: Diagnosis not present

## 2020-01-16 DIAGNOSIS — R0600 Dyspnea, unspecified: Secondary | ICD-10-CM

## 2020-01-16 DIAGNOSIS — Z87891 Personal history of nicotine dependence: Secondary | ICD-10-CM | POA: Insufficient documentation

## 2020-01-16 DIAGNOSIS — Z9011 Acquired absence of right breast and nipple: Secondary | ICD-10-CM | POA: Insufficient documentation

## 2020-01-16 DIAGNOSIS — R9439 Abnormal result of other cardiovascular function study: Secondary | ICD-10-CM

## 2020-01-16 DIAGNOSIS — I2510 Atherosclerotic heart disease of native coronary artery without angina pectoris: Secondary | ICD-10-CM | POA: Diagnosis not present

## 2020-01-16 HISTORY — PX: LEFT HEART CATH AND CORONARY ANGIOGRAPHY: CATH118249

## 2020-01-16 SURGERY — LEFT HEART CATH AND CORONARY ANGIOGRAPHY
Anesthesia: LOCAL

## 2020-01-16 MED ORDER — HEPARIN SODIUM (PORCINE) 1000 UNIT/ML IJ SOLN
INTRAMUSCULAR | Status: DC | PRN
  Administered 2020-01-16: 4000 [IU] via INTRAVENOUS
  Administered 2020-01-16: 5000 [IU] via INTRAVENOUS

## 2020-01-16 MED ORDER — MIDAZOLAM HCL 2 MG/2ML IJ SOLN
INTRAMUSCULAR | Status: DC | PRN
  Administered 2020-01-16 (×2): 1 mg via INTRAVENOUS

## 2020-01-16 MED ORDER — SODIUM CHLORIDE 0.9 % IV SOLN: 250.0000 mL | INTRAVENOUS | Status: DC | PRN

## 2020-01-16 MED ORDER — HEPARIN (PORCINE) IN NACL 1000-0.9 UT/500ML-% IV SOLN
INTRAVENOUS | Status: DC | PRN
  Administered 2020-01-16 (×3): 500 mL

## 2020-01-16 MED ORDER — SODIUM CHLORIDE 0.9 % WEIGHT BASED INFUSION: 1.0000 mL/kg/h | INTRAVENOUS | Status: DC

## 2020-01-16 MED ORDER — SODIUM CHLORIDE 0.9% FLUSH: 3.0000 mL | Freq: Two times a day (BID) | INTRAVENOUS | Status: DC

## 2020-01-16 MED ORDER — ONDANSETRON HCL 4 MG/2ML IJ SOLN: 4.0000 mg | Freq: Four times a day (QID) | INTRAMUSCULAR | Status: DC | PRN

## 2020-01-16 MED ORDER — ACETAMINOPHEN 325 MG PO TABS: 650.0000 mg | ORAL_TABLET | ORAL | Status: DC | PRN

## 2020-01-16 MED ORDER — FENTANYL CITRATE (PF) 100 MCG/2ML IJ SOLN
INTRAMUSCULAR | Status: AC
  Filled 2020-01-16: qty 2

## 2020-01-16 MED ORDER — SODIUM CHLORIDE 0.9 % WEIGHT BASED INFUSION
3.0000 mL/kg/h | INTRAVENOUS | Status: AC
  Administered 2020-01-16: 3 mL/kg/h via INTRAVENOUS

## 2020-01-16 MED ORDER — LIDOCAINE HCL (PF) 1 % IJ SOLN
INTRAMUSCULAR | Status: DC | PRN
  Administered 2020-01-16: 2 mL

## 2020-01-16 MED ORDER — HYDRALAZINE HCL 20 MG/ML IJ SOLN: 10.0000 mg | INTRAMUSCULAR | Status: DC | PRN

## 2020-01-16 MED ORDER — MIDAZOLAM HCL 2 MG/2ML IJ SOLN
INTRAMUSCULAR | Status: AC
  Filled 2020-01-16: qty 2

## 2020-01-16 MED ORDER — LABETALOL HCL 5 MG/ML IV SOLN: 10.0000 mg | INTRAVENOUS | Status: DC | PRN

## 2020-01-16 MED ORDER — VERAPAMIL HCL 2.5 MG/ML IV SOLN
INTRAVENOUS | Status: AC
  Filled 2020-01-16: qty 2

## 2020-01-16 MED ORDER — IOHEXOL 350 MG/ML SOLN
INTRAVENOUS | Status: DC | PRN
  Administered 2020-01-16: 65 mL

## 2020-01-16 MED ORDER — SODIUM CHLORIDE 0.9 % IV SOLN: INTRAVENOUS | Status: AC

## 2020-01-16 MED ORDER — SODIUM CHLORIDE 0.9% FLUSH: 3.0000 mL | INTRAVENOUS | Status: DC | PRN

## 2020-01-16 MED ORDER — LIDOCAINE HCL (PF) 1 % IJ SOLN
INTRAMUSCULAR | Status: AC
  Filled 2020-01-16: qty 30

## 2020-01-16 MED ORDER — HEPARIN (PORCINE) IN NACL 1000-0.9 UT/500ML-% IV SOLN
INTRAVENOUS | Status: AC
  Filled 2020-01-16: qty 1000

## 2020-01-16 MED ORDER — HEPARIN SODIUM (PORCINE) 1000 UNIT/ML IJ SOLN
INTRAMUSCULAR | Status: AC
  Filled 2020-01-16: qty 1

## 2020-01-16 MED ORDER — VERAPAMIL HCL 2.5 MG/ML IV SOLN
INTRAVENOUS | Status: DC | PRN
  Administered 2020-01-16: 10 mL via INTRA_ARTERIAL

## 2020-01-16 MED ORDER — FENTANYL CITRATE (PF) 100 MCG/2ML IJ SOLN
INTRAMUSCULAR | Status: DC | PRN
Start: 2020-01-16 — End: 2020-01-16
  Administered 2020-01-16 (×2): 25 ug via INTRAVENOUS

## 2020-01-16 SURGICAL SUPPLY — 13 items
CATH 5FR JL3.5 JR4 ANG PIG MP (CATHETERS) ×1 IMPLANT
CATH VISTA GUIDE 6FR XB3 (CATHETERS) ×1 IMPLANT
DEVICE RAD COMP TR BAND LRG (VASCULAR PRODUCTS) ×1 IMPLANT
GLIDESHEATH SLEND SS 6F .021 (SHEATH) ×1 IMPLANT
GUIDEWIRE INQWIRE 1.5J.035X260 (WIRE) IMPLANT
GUIDEWIRE PRESSURE COMET II (WIRE) ×1 IMPLANT
INQWIRE 1.5J .035X260CM (WIRE) ×2
KIT ESSENTIALS PG (KITS) ×1 IMPLANT
KIT HEART LEFT (KITS) ×2 IMPLANT
PACK CARDIAC CATHETERIZATION (CUSTOM PROCEDURE TRAY) ×2 IMPLANT
TRANSDUCER W/STOPCOCK (MISCELLANEOUS) ×2 IMPLANT
TUBING CIL FLEX 10 FLL-RA (TUBING) ×2 IMPLANT
WIRE HI TORQ VERSACORE-J 145CM (WIRE) ×1 IMPLANT

## 2020-01-16 NOTE — Progress Notes (Signed)
Patient was given discharge instructions. She verbalized understanding. 

## 2020-01-16 NOTE — Discharge Instructions (Signed)
Drink plenty of fluid for 48 hours and keep wrist elevated at heart level for 24 hours  Radial Site Care   This sheet gives you information about how to care for yourself after your procedure. Your health care provider may also give you more specific instructions. If you have problems or questions, contact your health care provider. What can I expect after the procedure? After the procedure, it is common to have:  Bruising and tenderness at the catheter insertion area. Follow these instructions at home: Medicines  Take over-the-counter and prescription medicines only as told by your health care provider. Insertion site care 1. Follow instructions from your health care provider about how to take care of your insertion site. Make sure you: ? Wash your hands with soap and water before you change your bandage (dressing). If soap and water are not available, use hand sanitizer. ? remove your dressing as told by your health care provider. In 24 hours 2. Check your insertion site every day for signs of infection. Check for: ? Redness, swelling, or pain. ? Fluid or blood. ? Pus or a bad smell. ? Warmth. 3. Do not take baths, swim, or use a hot tub until your health care provider approves. 4. You may shower 24-48 hours after the procedure, or as directed by your health care provider. ? Remove the dressing and gently wash the site with plain soap and water. ? Pat the area dry with a clean towel. ? Do not rub the site. That could cause bleeding. 5. Do not apply powder or lotion to the site. Activity   1. For 24 hours after the procedure, or as directed by your health care provider: ? Do not flex or bend the affected arm. ? Do not push or pull heavy objects with the affected arm. ? Do not drive yourself home from the hospital or clinic. You may drive 24 hours after the procedure unless your health care provider tells you not to. ? Do not operate machinery or power tools. 2. Do not lift  anything that is heavier than 10 lb (4.5 kg), or the limit that you are told, until your health care provider says that it is safe. For 4 days 3. Ask your health care provider when it is okay to: ? Return to work or school. ? Resume usual physical activities or sports. ? Resume sexual activity. General instructions  If the catheter site starts to bleed, raise your arm and put firm pressure on the site. If the bleeding does not stop, get help right away. This is a medical emergency.  If you went home on the same day as your procedure, a responsible adult should be with you for the first 24 hours after you arrive home.  Keep all follow-up visits as told by your health care provider. This is important. Contact a health care provider if:  You have a fever.  You have redness, swelling, or yellow drainage around your insertion site. Get help right away if:  You have unusual pain at the radial site.  The catheter insertion area swells very fast.  The insertion area is bleeding, and the bleeding does not stop when you hold steady pressure on the area.  Your arm or hand becomes pale, cool, tingly, or numb. These symptoms may represent a serious problem that is an emergency. Do not wait to see if the symptoms will go away. Get medical help right away. Call your local emergency services (911 in the [place] Do not   drive yourself to the hospital. Summary  After the procedure, it is common to have bruising and tenderness at the site.  Follow instructions from your health care provider about how to take care of your radial site wound. Check the wound every day for signs of infection.  Do not lift anything that is heavier than 10 lb (4.5 kg), or the limit that you are told, until your health care provider says that it is safe. This information is not intended to replace advice given to you by your health care provider. Make sure you discuss any questions you have with your health care  provider. Document Revised: 05/20/2017 Document Reviewed: 05/20/2017 Elsevier Patient Education  2020 Elsevier Inc.  

## 2020-01-16 NOTE — Interval H&P Note (Signed)
History and Physical Interval Note:  01/16/2020 9:44 AM  [name]  has presented today for surgery, with the diagnosis of syncope, shortness of breath.  The various methods of treatment have been discussed with the patient and family. After consideration of risks, benefits and other options for treatment, the patient has consented to  Procedure(s): LEFT HEART CATH AND CORONARY ANGIOGRAPHY (N/A) as a surgical intervention.  The patient's history has been reviewed, patient examined, no change in status, stable for surgery.  I have reviewed the patient's chart and labs.  Questions were answered to the patient's satisfaction.    Cath Lab Visit (complete for each Cath Lab visit)  Clinical Evaluation Leading to the Procedure:   ACS: No.  Non-ACS:    Anginal Classification: CCS II  Anti-ischemic medical therapy: Minimal Therapy (1 class of medications)  Non-Invasive Test Results: High-risk stress test findings: cardiac mortality >3%/year  Prior CABG: No previous CABG        [name]

## 2020-01-17 LAB — POCT ACTIVATED CLOTTING TIME: Activated Clotting Time: 230 seconds

## 2020-01-19 DIAGNOSIS — M1611 Unilateral primary osteoarthritis, right hip: Secondary | ICD-10-CM | POA: Diagnosis not present

## 2020-01-19 DIAGNOSIS — N189 Chronic kidney disease, unspecified: Secondary | ICD-10-CM | POA: Diagnosis not present

## 2020-01-19 DIAGNOSIS — I129 Hypertensive chronic kidney disease with stage 1 through stage 4 chronic kidney disease, or unspecified chronic kidney disease: Secondary | ICD-10-CM | POA: Diagnosis not present

## 2020-01-19 DIAGNOSIS — D509 Iron deficiency anemia, unspecified: Secondary | ICD-10-CM | POA: Diagnosis not present

## 2020-02-16 ENCOUNTER — Ambulatory Visit: Payer: Medicare Other | Admitting: Nurse Practitioner

## 2020-02-22 ENCOUNTER — Encounter: Payer: Self-pay | Admitting: Cardiology

## 2020-02-22 ENCOUNTER — Ambulatory Visit: Payer: Medicare Other | Admitting: Cardiology

## 2020-02-22 VITALS — BP 124/68 | HR 67 | Ht 65.5 in | Wt 159.0 lb

## 2020-02-22 DIAGNOSIS — I25119 Atherosclerotic heart disease of native coronary artery with unspecified angina pectoris: Secondary | ICD-10-CM | POA: Diagnosis not present

## 2020-02-22 DIAGNOSIS — I10 Essential (primary) hypertension: Secondary | ICD-10-CM | POA: Diagnosis not present

## 2020-02-22 MED ORDER — ASPIRIN EC 81 MG PO TBEC: 81.0000 mg | DELAYED_RELEASE_TABLET | ORAL | 3 refills | Status: DC

## 2020-02-22 NOTE — Progress Notes (Signed)
Cardiology Office Note  Date: 02/22/2020   ID: Catherin, Doorn 09/27/1940, MRN [id number]  PCP:  Celene Squibb, MD  Cardiologist:  Rozann Lesches, MD Electrophysiologist:  None   Chief Complaint  Patient presents with  . Cardiac follow-up    History of Present Illness: [name] is a 79 y.o. female last seen in September and subsequently referred for a Lexiscan Myoview to evaluate dyspnea on exertion and suspected angina symptoms.  Myocardial perfusion study was abnormal showing a small partial reversible apical defect and also increased TID ratio, LVEF 66%.  Diagnostic cardiac catheterization was recommended to clarify coronary anatomy.  Cardiac catheterization was performed by Dr. Angelena Form on September 20.  Moderate mid circumflex stenosis was noted, not flow-limiting by functional assessment with DFR 0.96.  The distal circumflex was very small in caliber with significant stenosis however too small for PCI.  Ultimately medical therapy was recommended.  She presents today stating that she feels reassured by the findings, I discussed the results with her again today.  She does not report any progressive chest discomfort, none recently.  She has had trouble taking higher dose aspirin in the past, we will try 81 mg every other day.  Also requesting lipid panel from Dr. Nevada Crane with eye toward statin therapy.  She will otherwise continue on Norvasc.  Past Medical History:  Diagnosis Date  . Anemia   . Arthritis   . Breast cancer ([place])    Right mastectomy  . Chronic back pain   . Coronary atherosclerosis of native coronary artery    Moderate multivessel disease - medical therapy recommended September 2021  . Environmental allergies   . Essential hypertension   . GERD (gastroesophageal reflux disease)   . History of bronchitis   . History of colon polyps   . History of diverticulosis   . History of migraine   . Panic attacks   . Prolapse of vaginal vault after  hysterectomy   . Seasonal allergies   . Urinary frequency     Past Surgical History:  Procedure Laterality Date  . ABDOMINAL HYSTERECTOMY    . BACK SURGERY  1981/2011/2014  . BLADDER SUSPENSION    . BREAST RECONSTRUCTION  2004   with abd tissue  . BREAST SURGERY Right   . CHOLECYSTECTOMY    . COLONOSCOPY  12/2009   RMR: few pancolonic diverticula. next TCS 12/2014  . COLONOSCOPY  06/14/2007   YCX:KGYJEHUDJS rectal polyp, status post cold biopsy removal/ Anal canal hemorrhoids/Left-sided diverticula Colonic mucosa appeared normal.Hyperplastic polyp.   . COLONOSCOPY  01/18/2004   HFW:YOVZCHY diverticula/Internal hemorrhoids.  Otherwise normal rectum  . COLONOSCOPY N/A 04/18/2013   Dr. Gala Romney- normal rectum, scattered left sided diverticula the remainder of the colonic mucosa appeared normal  . COLONOSCOPY N/A 08/18/2014   Procedure: COLONOSCOPY;  Surgeon: Daneil Dolin, MD;  [facility];  Service: Endoscopy;  Laterality: N/A;  1000  . ESOPHAGOGASTRODUODENOSCOPY  06/14/2007   IFO:YDXAJO-INOM plaques in esophageal mucosa of uncertain significance brushed and biopsied/Normal stomach, normal first duodenum and second duodenoscopy. KOH negative. esophageal bx c/w GERD  . ESOPHAGOGASTRODUODENOSCOPY N/A 08/18/2014   Procedure: ESOPHAGOGASTRODUODENOSCOPY (EGD);  Surgeon: Daneil Dolin, MD;  [facility];  Service: Endoscopy;  Laterality: N/A;  . ESOPHAGOGASTRODUODENOSCOPY (EGD) WITH ESOPHAGEAL DILATION N/A 04/18/2013   Dr. Gala Romney- normal, patent appearing, tubular esophagus, normal gastric mucosa, paptent pylorus, normal first and second portion of the duodenum  . EYE SURGERY  lasik  . FOOT SURGERY Right 2014   d/t plantar fascitis  . GIVENS CAPSULE STUDY N/A 07/18/2013   L.Lewis PAC- markedly abnormal appearing gastric mucosa, sugnificant bile reflux, questionable subucosal small bowel mass versus extrinsic compression in the distal small bowel.- CTE= no small bowel  mass or tumor seen.  Marland Kitchen LEFT HEART CATH AND CORONARY ANGIOGRAPHY N/A 01/16/2020   Procedure: LEFT HEART CATH AND CORONARY ANGIOGRAPHY;  Surgeon: Burnell Blanks, MD;  Location: [place] [facility];  Service: Cardiovascular;  Laterality: N/A;  . Venia Minks DILATION N/A 08/18/2014   Procedure: Venia Minks DILATION;  Surgeon: Daneil Dolin, MD;  [facility];  Service: Endoscopy;  Laterality: N/A;  . MASTECTOMY     right side 12-13 years ago  . TOTAL HIP ARTHROPLASTY Left 02/29/2016  . TOTAL HIP ARTHROPLASTY Left 02/29/2016   Procedure: TOTAL HIP ARTHROPLASTY ANTERIOR APPROACH;  Surgeon: Frederik Pear, MD;  Location: [place];  Service: Orthopedics;  Laterality: Left;  . TOTAL HIP ARTHROPLASTY Right 01/05/2017   Procedure: TOTAL HIP ARTHROPLASTY ANTERIOR APPROACH;  Surgeon: Frederik Pear, MD;  Location: [place];  Service: Orthopedics;  Laterality: Right;  REQUEST 90 MINS    Current Outpatient Medications  Medication Sig Dispense Refill  . amLODipine (NORVASC) 5 MG tablet Take 5 mg by mouth at bedtime.   5  . diphenhydrAMINE (BENADRYL) 25 MG tablet Take 25 mg by mouth daily as needed for allergies.    . diphenhydrAMINE HCl (ZZZQUIL) 50 MG/30ML LIQD Take 50 mg by mouth at bedtime as needed (sleep).    Mariane Baumgarten Calcium (STOOL SOFTENER PO) Take 1 tablet by mouth daily as needed (constipation).    Marland Kitchen HYDROcodone-acetaminophen (NORCO) 10-325 MG tablet Take 1 tablet by mouth 2 (two) times daily as needed for moderate pain.     Marland Kitchen sertraline (ZOLOFT) 25 MG tablet Take 12.5 mg by mouth daily.     Marland Kitchen aspirin EC 81 MG tablet Take 1 tablet (81 mg total) by mouth every other day. Swallow whole. 45 tablet 3   No current facility-administered medications for this visit.   Allergies:  Penicillins and Apixaban   ROS: No palpitations or syncope.  Physical Exam: VS:  BP 124/68   Pulse 67   Ht 5' 5.5" (1.664 m)   Wt 159 lb (72.1 kg)   SpO2 98%   BMI 26.06 kg/m , BMI Body mass index is 26.06 kg/m.  Wt  Readings from Last 3 Encounters:  02/22/20 159 lb (72.1 kg)  01/16/20 160 lb (72.6 kg)  12/29/19 160 lb (72.6 kg)    General: Patient appears comfortable at rest. HEENT: Conjunctiva and lids normal, wearing a mask.. Neck: Supple, no elevated JVP or carotid bruits, no thyromegaly. Lungs: Clear to auscultation, nonlabored breathing at rest. Cardiac: Regular rate and rhythm with occasional ectopy, no S3, soft systolic murmur, no pericardial rub. Extremities: No pitting edema, distal pulses 2+.  ECG:  An ECG dated 01/16/2020 was personally reviewed today and demonstrated:  Sinus rhythm with PAC, decreased R wave progression.  Recent Labwork: 01/13/2020: BUN 22; Creatinine, Ser 1.20; Hemoglobin 11.0; Platelets 294; Potassium 4.0; Sodium 136   Other Studies Reviewed Today:  Echocardiogram 01/06/2020: 1. Left ventricular ejection fraction, by estimation, is 65 to 70%. The  left ventricle has normal function. The left ventricle has no regional  wall motion abnormalities. Left ventricular diastolic parameters are  indeterminate.  2. Right ventricular systolic function is normal. The right ventricular  size is normal. Tricuspid regurgitation signal is  inadequate for assessing  PA pressure.  3. Left atrial size was mildly dilated.  4. The mitral valve is grossly normal. Mild mitral valve regurgitation.  5. The aortic valve is tricuspid. Aortic valve regurgitation is not  visualized.  6. The inferior vena cava is normal in size with greater than 50%  respiratory variability, suggesting right atrial pressure of 3 mmHg.   Lexiscan Myoview 01/06/2020:  No diagnostic ST segment changes to indicate ischemia. Occasional PACs.  Small, mild intensity, apical anteroseptal defect that is partially reversible in the setting of breast attenuation, mild ischemia not excluded. TID ratio increased at 1.31 so cannot exclude the possibility of more diffuse subendocardial ischemia.  This is a high risk  study due to increased TID ratio.  Nuclear stress EF: 66%.  Cardiac catheterization 01/16/2020:  Mid RCA lesion is 40% stenosed.  Prox RCA lesion is 20% stenosed.  Dist Cx lesion is 80% stenosed.  Prox Cx lesion is 50% stenosed.  Mid LAD lesion is 20% stenosed.   1. Moderate stenosis mid Circumflex. This lesion is not flow limiting by functional assessment (DFR 0.96). The small caliber distal Circumflex has a severe stenosis but this vessel appears to be too small for PCI (1.75 mm vessel).  2. Mild plaque in the LAD 3. The RCA is a large dominant vessel with mild to moderate calcified plaque in the mid and distal vessel.   Recommendations: Medical management of CAD. The stenosis in the mid Circumflex is moderate angiographically but by functional flow wire assessment is not flow limiting (DFR 0.96). The small caliber distal Circumflex has a severe stenosis but this vessel appears to be too small for PCI (1.75 mm vessel).   Assessment and Plan:  1.  Moderate multivessel CAD as discussed above with plan for medical therapy.  She will start aspirin 81 mg every other day, continue Norvasc, we will request lipid panel from Dr. Nevada Crane with eye toward statin therapy as well.  Walking plan for exercise.  2.  Essential hypertension, continue Norvasc.  Blood pressure is well controlled today.  Medication Adjustments/Labs and Tests Ordered: Current medicines are reviewed at length with the patient today.  Concerns regarding medicines are outlined above.   Tests Ordered: No orders of the defined types were placed in this encounter.   Medication Changes: Meds ordered this encounter  Medications  . aspirin EC 81 MG tablet    Sig: Take 1 tablet (81 mg total) by mouth every other day. Swallow whole.    Dispense:  45 tablet    Refill:  3    Disposition:  Follow up 6 months in the [place] office.  Signed, Satira Sark, MD, [facility] 02/22/2020 3:09 PM    [facility] [place], [place], [place] [address] Phone: [phone]; Fax: [phone]

## 2020-02-22 NOTE — Patient Instructions (Addendum)
Medication Instructions:   Your physician has recommended you make the following change in your medication:   Start aspirin 81 mg by mouth every other day  Continue other medications the same  Labwork:  None  Testing/Procedures:  None  Follow-Up:  Your physician recommends that you schedule a follow-up appointment in: 6 months.   Any Other Special Instructions Will Be Listed Below (If Applicable).  If you need a refill on your cardiac medications before your next appointment, please call your pharmacy.

## 2020-02-23 DIAGNOSIS — M05849 Other rheumatoid arthritis with rheumatoid factor of unspecified hand: Secondary | ICD-10-CM | POA: Diagnosis not present

## 2020-02-23 DIAGNOSIS — I10 Essential (primary) hypertension: Secondary | ICD-10-CM | POA: Diagnosis not present

## 2020-02-23 DIAGNOSIS — M25559 Pain in unspecified hip: Secondary | ICD-10-CM | POA: Diagnosis not present

## 2020-02-29 DIAGNOSIS — I10 Essential (primary) hypertension: Secondary | ICD-10-CM | POA: Diagnosis not present

## 2020-02-29 DIAGNOSIS — M05849 Other rheumatoid arthritis with rheumatoid factor of unspecified hand: Secondary | ICD-10-CM | POA: Diagnosis not present

## 2020-03-01 DIAGNOSIS — R7301 Impaired fasting glucose: Secondary | ICD-10-CM | POA: Diagnosis not present

## 2020-03-01 DIAGNOSIS — N1832 Chronic kidney disease, stage 3b: Secondary | ICD-10-CM | POA: Diagnosis not present

## 2020-03-01 DIAGNOSIS — G2581 Restless legs syndrome: Secondary | ICD-10-CM | POA: Diagnosis not present

## 2020-03-01 DIAGNOSIS — R0602 Shortness of breath: Secondary | ICD-10-CM | POA: Diagnosis not present

## 2020-03-01 DIAGNOSIS — R202 Paresthesia of skin: Secondary | ICD-10-CM | POA: Diagnosis not present

## 2020-03-01 DIAGNOSIS — I129 Hypertensive chronic kidney disease with stage 1 through stage 4 chronic kidney disease, or unspecified chronic kidney disease: Secondary | ICD-10-CM | POA: Diagnosis not present

## 2020-03-01 DIAGNOSIS — R944 Abnormal results of kidney function studies: Secondary | ICD-10-CM | POA: Diagnosis not present

## 2020-03-07 DIAGNOSIS — M069 Rheumatoid arthritis, unspecified: Secondary | ICD-10-CM | POA: Diagnosis not present

## 2020-03-07 DIAGNOSIS — N1832 Chronic kidney disease, stage 3b: Secondary | ICD-10-CM | POA: Diagnosis not present

## 2020-03-07 DIAGNOSIS — R945 Abnormal results of liver function studies: Secondary | ICD-10-CM | POA: Diagnosis not present

## 2020-03-07 DIAGNOSIS — Z23 Encounter for immunization: Secondary | ICD-10-CM | POA: Diagnosis not present

## 2020-03-07 DIAGNOSIS — I129 Hypertensive chronic kidney disease with stage 1 through stage 4 chronic kidney disease, or unspecified chronic kidney disease: Secondary | ICD-10-CM | POA: Diagnosis not present

## 2020-03-12 DIAGNOSIS — G959 Disease of spinal cord, unspecified: Secondary | ICD-10-CM | POA: Diagnosis not present

## 2020-03-12 DIAGNOSIS — Z6826 Body mass index (BMI) 26.0-26.9, adult: Secondary | ICD-10-CM | POA: Insufficient documentation

## 2020-03-12 DIAGNOSIS — M47812 Spondylosis without myelopathy or radiculopathy, cervical region: Secondary | ICD-10-CM | POA: Insufficient documentation

## 2020-03-12 DIAGNOSIS — I10 Essential (primary) hypertension: Secondary | ICD-10-CM | POA: Diagnosis not present

## 2020-03-15 ENCOUNTER — Other Ambulatory Visit (HOSPITAL_COMMUNITY): Payer: Self-pay | Admitting: Internal Medicine

## 2020-03-15 DIAGNOSIS — Z1231 Encounter for screening mammogram for malignant neoplasm of breast: Secondary | ICD-10-CM

## 2020-04-30 ENCOUNTER — Ambulatory Visit (HOSPITAL_COMMUNITY)
Admission: RE | Admit: 2020-04-30 | Discharge: 2020-04-30 | Disposition: A | Discharge status: Home / Self Care | Payer: Medicare Other | Source: Ambulatory Visit | Attending: Internal Medicine | Admitting: Internal Medicine

## 2020-04-30 ENCOUNTER — Other Ambulatory Visit: Payer: Self-pay

## 2020-04-30 DIAGNOSIS — Z1231 Encounter for screening mammogram for malignant neoplasm of breast: Secondary | ICD-10-CM | POA: Insufficient documentation

## 2020-06-13 DIAGNOSIS — L820 Inflamed seborrheic keratosis: Secondary | ICD-10-CM | POA: Diagnosis not present

## 2020-06-13 DIAGNOSIS — L298 Other pruritus: Secondary | ICD-10-CM | POA: Diagnosis not present

## 2020-06-13 DIAGNOSIS — L814 Other melanin hyperpigmentation: Secondary | ICD-10-CM | POA: Diagnosis not present

## 2020-06-13 DIAGNOSIS — L821 Other seborrheic keratosis: Secondary | ICD-10-CM | POA: Diagnosis not present

## 2020-06-14 DIAGNOSIS — M25551 Pain in right hip: Secondary | ICD-10-CM | POA: Diagnosis not present

## 2020-06-14 DIAGNOSIS — M7061 Trochanteric bursitis, right hip: Secondary | ICD-10-CM | POA: Diagnosis not present

## 2020-06-25 DIAGNOSIS — R82994 Hypercalciuria: Secondary | ICD-10-CM | POA: Diagnosis not present

## 2020-06-25 DIAGNOSIS — I10 Essential (primary) hypertension: Secondary | ICD-10-CM | POA: Diagnosis not present

## 2020-06-25 DIAGNOSIS — K219 Gastro-esophageal reflux disease without esophagitis: Secondary | ICD-10-CM | POA: Diagnosis not present

## 2020-06-28 DIAGNOSIS — M47812 Spondylosis without myelopathy or radiculopathy, cervical region: Secondary | ICD-10-CM | POA: Diagnosis not present

## 2020-06-28 DIAGNOSIS — M5412 Radiculopathy, cervical region: Secondary | ICD-10-CM | POA: Diagnosis not present

## 2020-06-28 DIAGNOSIS — M069 Rheumatoid arthritis, unspecified: Secondary | ICD-10-CM | POA: Diagnosis not present

## 2020-06-28 DIAGNOSIS — M5416 Radiculopathy, lumbar region: Secondary | ICD-10-CM | POA: Diagnosis not present

## 2020-06-28 DIAGNOSIS — M47816 Spondylosis without myelopathy or radiculopathy, lumbar region: Secondary | ICD-10-CM | POA: Diagnosis not present

## 2020-06-29 DIAGNOSIS — J069 Acute upper respiratory infection, unspecified: Secondary | ICD-10-CM | POA: Diagnosis not present

## 2020-06-29 DIAGNOSIS — R059 Cough, unspecified: Secondary | ICD-10-CM | POA: Diagnosis not present

## 2020-06-29 DIAGNOSIS — J029 Acute pharyngitis, unspecified: Secondary | ICD-10-CM | POA: Diagnosis not present

## 2020-07-03 DIAGNOSIS — R059 Cough, unspecified: Secondary | ICD-10-CM | POA: Diagnosis not present

## 2020-07-03 DIAGNOSIS — G4700 Insomnia, unspecified: Secondary | ICD-10-CM | POA: Diagnosis not present

## 2020-07-03 DIAGNOSIS — K219 Gastro-esophageal reflux disease without esophagitis: Secondary | ICD-10-CM | POA: Diagnosis not present

## 2020-07-04 DIAGNOSIS — I2510 Atherosclerotic heart disease of native coronary artery without angina pectoris: Secondary | ICD-10-CM | POA: Diagnosis not present

## 2020-07-04 DIAGNOSIS — R7301 Impaired fasting glucose: Secondary | ICD-10-CM | POA: Diagnosis not present

## 2020-07-04 DIAGNOSIS — G2581 Restless legs syndrome: Secondary | ICD-10-CM | POA: Diagnosis not present

## 2020-07-04 DIAGNOSIS — R0602 Shortness of breath: Secondary | ICD-10-CM | POA: Diagnosis not present

## 2020-07-04 DIAGNOSIS — R944 Abnormal results of kidney function studies: Secondary | ICD-10-CM | POA: Diagnosis not present

## 2020-07-04 DIAGNOSIS — R202 Paresthesia of skin: Secondary | ICD-10-CM | POA: Diagnosis not present

## 2020-07-11 DIAGNOSIS — M5416 Radiculopathy, lumbar region: Secondary | ICD-10-CM | POA: Diagnosis not present

## 2020-07-24 DIAGNOSIS — K3189 Other diseases of stomach and duodenum: Secondary | ICD-10-CM | POA: Insufficient documentation

## 2020-07-25 DIAGNOSIS — F5101 Primary insomnia: Secondary | ICD-10-CM | POA: Diagnosis not present

## 2020-07-25 DIAGNOSIS — M48061 Spinal stenosis, lumbar region without neurogenic claudication: Secondary | ICD-10-CM | POA: Diagnosis not present

## 2020-07-25 DIAGNOSIS — K219 Gastro-esophageal reflux disease without esophagitis: Secondary | ICD-10-CM | POA: Diagnosis not present

## 2020-07-25 DIAGNOSIS — I499 Cardiac arrhythmia, unspecified: Secondary | ICD-10-CM | POA: Insufficient documentation

## 2020-07-25 DIAGNOSIS — Z23 Encounter for immunization: Secondary | ICD-10-CM | POA: Diagnosis not present

## 2020-07-25 DIAGNOSIS — I10 Essential (primary) hypertension: Secondary | ICD-10-CM | POA: Diagnosis not present

## 2020-07-25 DIAGNOSIS — Z13820 Encounter for screening for osteoporosis: Secondary | ICD-10-CM | POA: Diagnosis not present

## 2020-07-25 DIAGNOSIS — Z780 Asymptomatic menopausal state: Secondary | ICD-10-CM | POA: Diagnosis not present

## 2020-07-25 DIAGNOSIS — Z0000 Encounter for general adult medical examination without abnormal findings: Secondary | ICD-10-CM | POA: Diagnosis not present

## 2020-07-26 DIAGNOSIS — I10 Essential (primary) hypertension: Secondary | ICD-10-CM | POA: Diagnosis not present

## 2020-07-26 DIAGNOSIS — M5416 Radiculopathy, lumbar region: Secondary | ICD-10-CM | POA: Diagnosis not present

## 2020-08-14 DIAGNOSIS — Z13820 Encounter for screening for osteoporosis: Secondary | ICD-10-CM | POA: Diagnosis not present

## 2020-08-14 DIAGNOSIS — Z780 Asymptomatic menopausal state: Secondary | ICD-10-CM | POA: Diagnosis not present

## 2020-08-29 ENCOUNTER — Ambulatory Visit: Payer: Medicare Other | Admitting: Cardiology

## 2020-09-05 DIAGNOSIS — F5101 Primary insomnia: Secondary | ICD-10-CM | POA: Diagnosis not present

## 2020-09-05 DIAGNOSIS — E785 Hyperlipidemia, unspecified: Secondary | ICD-10-CM | POA: Insufficient documentation

## 2020-09-05 DIAGNOSIS — I10 Essential (primary) hypertension: Secondary | ICD-10-CM | POA: Diagnosis not present

## 2020-09-05 DIAGNOSIS — E782 Mixed hyperlipidemia: Secondary | ICD-10-CM | POA: Insufficient documentation

## 2020-09-05 DIAGNOSIS — I499 Cardiac arrhythmia, unspecified: Secondary | ICD-10-CM | POA: Diagnosis not present

## 2020-09-06 DIAGNOSIS — M278 Other specified diseases of jaws: Secondary | ICD-10-CM | POA: Diagnosis not present

## 2020-09-06 DIAGNOSIS — K045 Chronic apical periodontitis: Secondary | ICD-10-CM | POA: Diagnosis not present

## 2020-10-03 NOTE — Progress Notes (Deleted)
Office Visit Note  Patient: [name]             Date of Birth: 18-Dec-1940           MRN: [id number]             PCP: Celene Squibb, MD Referring: Celedonio Miyamoto, MD Visit Date: 10/16/2020 Occupation: @GUAROCC @  Subjective:  No chief complaint on file.   History of Present Illness: [name] is a 80 y.o. female ***   Activities of Daily Living:  Patient reports morning stiffness for *** {minute/hour:19697}.   Patient {ACTIONS;DENIES/REPORTS:21021675::"Denies"} nocturnal pain.  Difficulty dressing/grooming: {ACTIONS;DENIES/REPORTS:21021675::"Denies"} Difficulty climbing stairs: {ACTIONS;DENIES/REPORTS:21021675::"Denies"} Difficulty getting out of chair: {ACTIONS;DENIES/REPORTS:21021675::"Denies"} Difficulty using hands for taps, buttons, cutlery, and/or writing: {ACTIONS;DENIES/REPORTS:21021675::"Denies"}  No Rheumatology ROS completed.   PMFS History:  Patient Active Problem List   Diagnosis Date Noted  . Abnormal stress test   . Dyspnea on exertion   . 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/05/2017  . Osteoarthritis of right hip 01/02/2017  . Primary localized osteoarthritis of left hip 02/29/2016  . Primary osteoarthritis of left hip 02/22/2016  . Fitting and adjustment of pessary 05/29/2015  . Rectal pain, chronic 05/18/2015  . History of diverticulosis 05/04/2015  . Pelvic pain in female 02/20/2015  . Cystocele 02/20/2015  . 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 Hematuria 02/20/2015  . Abnormal findings on diagnostic imaging of liver and biliary tract 12/06/2014  . Dysphagia, pharyngoesophageal phase   . Mucosal abnormality of stomach   . Diverticulosis of colon without hemorrhage   . Rectal bleeding 07/25/2014  . Left sided abdominal pain 03/01/2014  . Heme positive stool 03/01/2014  . Esophageal dysphagia 04/04/2013  . Anemia 04/04/2013  . Abnormal LFTs 04/04/2013  . Change in bowel habits 04/04/2013  . Constipation 04/04/2013   . 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 Degeneration of intervertebral disc, site unspecified 12/15/2012  . Shortness of breath 01/14/2012  . Hypertensive heart disease 01/14/2012  . Coronary atherosclerosis of native coronary artery 12/14/2009  . ASTHMA 12/14/2009  . GERD 12/14/2009  . BREAST CANCER, HX OF 12/14/2009    Past Medical History:  Diagnosis Date  . Anemia   . Arthritis   . Breast cancer ([place])    Right mastectomy  . Chronic back pain   . Coronary atherosclerosis of native coronary artery    Moderate multivessel disease - medical therapy recommended September 2021  . Environmental allergies   . Essential hypertension   . GERD (gastroesophageal reflux disease)   . History of bronchitis   . History of colon polyps   . History of diverticulosis   . History of migraine   . Panic attacks   . Prolapse of vaginal vault after hysterectomy   . Seasonal allergies   . Urinary frequency     Family History  Problem Relation Age of Onset  . Coronary artery disease Mother   . Coronary artery disease Father   . Coronary artery disease Brother   . Cancer Sister        lung cancer, two sisters  . Colon cancer Brother        less than age 52  . Lung cancer Sister   . Cancer Brother   . Emphysema Brother    Past Surgical History:  Procedure Laterality Date  . ABDOMINAL HYSTERECTOMY    . BACK SURGERY  1981/2011/2014  . BLADDER SUSPENSION    . BREAST RECONSTRUCTION  2004   with abd tissue  . BREAST SURGERY Right   . CHOLECYSTECTOMY    . COLONOSCOPY  12/2009   RMR: few pancolonic diverticula. next TCS 12/2014  . COLONOSCOPY  06/14/2007   KVQ:QVZDGLOVFI rectal polyp, status post cold biopsy removal/ Anal canal hemorrhoids/Left-sided diverticula Colonic mucosa appeared normal.Hyperplastic polyp.   . COLONOSCOPY  01/18/2004   EPP:IRJJOAC diverticula/Internal hemorrhoids.  Otherwise normal rectum  . COLONOSCOPY N/A 04/18/2013   Dr. Gala Romney- normal rectum,  scattered left sided diverticula the remainder of the colonic mucosa appeared normal  . COLONOSCOPY N/A 08/18/2014   Procedure: COLONOSCOPY;  Surgeon: Daneil Dolin, MD;  [facility];  Service: Endoscopy;  Laterality: N/A;  1000  . ESOPHAGOGASTRODUODENOSCOPY  06/14/2007   ZYS:[id number]-[id number] plaques in esophageal mucosa of uncertain significance brushed and biopsied/Normal stomach, normal first duodenum and second duodenoscopy. KOH negative. esophageal bx c/w GERD  . ESOPHAGOGASTRODUODENOSCOPY N/A 08/18/2014   Procedure: ESOPHAGOGASTRODUODENOSCOPY (EGD);  Surgeon: Daneil Dolin, MD;  [facility];  Service: Endoscopy;  Laterality: N/A;  . ESOPHAGOGASTRODUODENOSCOPY (EGD) WITH ESOPHAGEAL DILATION N/A 04/18/2013   Dr. Gala Romney- normal, patent appearing, tubular esophagus, normal gastric mucosa, paptent pylorus, normal first and second portion of the duodenum  . EYE SURGERY     lasik  . FOOT SURGERY Right 2014   d/t plantar fascitis  . GIVENS CAPSULE STUDY N/A 07/18/2013   L.Lewis PAC- markedly abnormal appearing gastric mucosa, sugnificant bile reflux, questionable subucosal small bowel mass versus extrinsic compression in the distal small bowel.- CTE= no small bowel mass or tumor seen.  Marland Kitchen LEFT HEART CATH AND CORONARY ANGIOGRAPHY N/A 01/16/2020   Procedure: LEFT HEART CATH AND CORONARY ANGIOGRAPHY;  Surgeon: Burnell Blanks, MD;  Location: [place] [facility];  Service: Cardiovascular;  Laterality: N/A;  . Venia Minks DILATION N/A 08/18/2014   Procedure: Venia Minks DILATION;  Surgeon: Daneil Dolin, MD;  [facility];  Service: Endoscopy;  Laterality: N/A;  . MASTECTOMY     right side 12-13 years ago  . TOTAL HIP ARTHROPLASTY Left 02/29/2016  . TOTAL HIP ARTHROPLASTY Left 02/29/2016   Procedure: TOTAL HIP ARTHROPLASTY ANTERIOR APPROACH;  Surgeon: Frederik Pear, MD;  Location: [place];  Service: Orthopedics;  Laterality: Left;  . TOTAL HIP ARTHROPLASTY Right 01/05/2017    Procedure: TOTAL HIP ARTHROPLASTY ANTERIOR APPROACH;  Surgeon: Frederik Pear, MD;  Location: [place];  Service: Orthopedics;  Laterality: Right;  REQUEST 25 MINS   Social History   Social History Narrative   Patient lives at home with spouse.   Caffeine Use: 2 cups of coffee daily   Immunization History  Administered Date(s) Administered  . Influenza,inj,Quad PF,6+ Mos 01/16/2013  . Moderna Sars-Covid-2 Vaccination 06/09/2019, 07/08/2019     Objective: Vital Signs: There were no vitals taken for this visit.   Physical Exam   Musculoskeletal Exam: ***  CDAI Exam: CDAI Score: -- Patient Global: --; Provider Global: -- Swollen: --; Tender: -- Joint Exam 10/16/2020   No joint exam has been documented for this visit   There is currently no information documented on the homunculus. Go to the Rheumatology activity and complete the homunculus joint exam.  Investigation: No additional findings.  Imaging: No results found.  Recent Labs: Lab Results  Component Value Date   WBC 8.3 01/13/2020   HGB 11.0 (L) 01/13/2020   PLT 294 01/13/2020   NA 136 01/13/2020   K 4.0 01/13/2020   CL 103 01/13/2020   CO2 24 01/13/2020  GLUCOSE 112 (H) 01/13/2020   BUN 22 01/13/2020   CREATININE 1.20 (H) 01/13/2020   BILITOT 0.3 08/25/2014   ALKPHOS 79 08/25/2014   AST 29 08/25/2014   ALT 22 08/25/2014   PROT 6.9 08/25/2014   ALBUMIN 4.0 08/25/2014   CALCIUM 9.6 01/13/2020   GFRAA 50 (L) 01/13/2020    Speciality Comments: No specialty comments available.  Procedures:  No procedures performed Allergies: Penicillins and Apixaban   Assessment / Plan:     Visit Diagnoses: Rheumatoid arthritis involving multiple sites with positive rheumatoid factor ([place]) - Dx at [facility] in 2018.  RF 160 and anti-CCP 74, U/s-right wrist synovitis  High risk medication use - Previous tx: Enbrel-d/c infections, MTX-d/c nausea, Arava-d/c GI SE, Humira-d/c GI SE and not effective. currently on  Plaquenil?  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  No orders of the defined types were placed in this encounter.   Face-to-face time spent with patient was *** minutes. Greater than 50% of time was spent in counseling and coordination of care.  Follow-Up Instructions: No follow-ups on file.   Ofilia Neas, PA-C  Note - This record has been created using Dragon software.  Chart creation errors have been sought, but may not always  have been located. Such creation errors do not reflect on  the standard of medical care.

## 2020-10-10 ENCOUNTER — Other Ambulatory Visit: Payer: Self-pay

## 2020-10-10 ENCOUNTER — Ambulatory Visit
Admission: EM | Admit: 2020-10-10 | Discharge: 2020-10-10 | Disposition: A | Discharge status: Home / Self Care | Payer: Medicare Other | Attending: Family Medicine | Admitting: Family Medicine

## 2020-10-10 DIAGNOSIS — R350 Frequency of micturition: Secondary | ICD-10-CM | POA: Insufficient documentation

## 2020-10-10 DIAGNOSIS — R5383 Other fatigue: Secondary | ICD-10-CM | POA: Diagnosis not present

## 2020-10-10 DIAGNOSIS — N3090 Cystitis, unspecified without hematuria: Secondary | ICD-10-CM | POA: Diagnosis not present

## 2020-10-10 LAB — POCT URINALYSIS DIP (MANUAL ENTRY)
Bilirubin, UA: NEGATIVE
Glucose, UA: NEGATIVE mg/dL
Ketones, POC UA: NEGATIVE mg/dL — AB
Nitrite, UA: NEGATIVE
Protein Ur, POC: NEGATIVE mg/dL
Spec Grav, UA: >=1.03 — AB (ref 1.010–1.025)
Urobilinogen, UA: 0.2 E.U./dL
pH, UA: 6 (ref 5.0–8.0)

## 2020-10-10 MED ORDER — SULFAMETHOXAZOLE-TRIMETHOPRIM 800-160 MG PO TABS: 1.0000 | ORAL_TABLET | Freq: Two times a day (BID) | ORAL | 0 refills | Status: AC

## 2020-10-10 NOTE — ED Provider Notes (Signed)
[place]   [id number] 10/10/20 Arrival Time: [place]:  1. Cystitis   2. Urinary frequency   3. Fatigue, unspecified type    Begin: Meds ordered this encounter  Medications   sulfamethoxazole-trimethoprim (BACTRIM DS) 800-160 MG tablet    Sig: Take 1 tablet by mouth 2 (two) times daily for 5 days.    Dispense:  10 tablet    Refill:  0     Follow-up Information     Celene Squibb, MD.   Specialty: Internal Medicine Why: As needed. Contact information: [facility] [address] [phone]                 Reviewed expectations re: course of current medical issues. Questions answered. Outlined signs and symptoms indicating need for more acute intervention. Understanding verbalized. After Visit Summary given.   SUBJECTIVE: History from: patient. [name] is a 80 y.o. female who reports feeling fatigued; past week or two. Past several days with some urinary frequency. Ambulatory without difficulty. No extremity sensation changes or weakness. No HA. Transient "blurry vision this morning"; quickly resolved; has not recurred. No abd or back pain. No CP or palpitations. No new medications. Normal PO intake without n/v/d.   OBJECTIVE:  Vitals:   10/10/20 1446  BP: 104/66  Pulse: 66  Resp: 18  Temp: (!) 97.4 F (36.3 C)  SpO2: 96%    General appearance: alert; no distress Eyes: PERRLA; EOMI; conjunctiva normal HENT: [place]; AT; without nasal congestion Neck: supple  Lungs: speaks full sentences without difficulty; unlabored Abd: soft, benign Extremities: no edema Skin: warm and dry Neurologic: normal gait Psychological: alert and cooperative; normal mood and affect  Labs: Results for orders placed or performed during the hospital encounter of 10/10/20  POCT urinalysis dipstick  Result Value Ref Range   Color, UA yellow yellow   Clarity, UA cloudy (A) clear   Glucose, UA negative negative mg/dL    Bilirubin, UA negative negative   Ketones, POC UA negative (A) negative mg/dL   Spec Grav, UA >=1.030 (A) 1.010 - 1.025   Blood, UA moderate (A) negative   pH, UA 6.0 5.0 - 8.0   Protein Ur, POC negative negative mg/dL   Urobilinogen, UA 0.2 0.2 or 1.0 E.U./dL   Nitrite, UA Negative Negative   Leukocytes, UA Large (3+) (A) Negative    Allergies  Allergen Reactions   Penicillins Anaphylaxis, Swelling and Other (See Comments)    Blisters Has patient had a PCN reaction causing immediate rash, facial/tongue/throat swelling, SOB or lightheadedness with hypotension: Yes Has patient had a PCN reaction causing severe rash involving mucus membranes or skin necrosis: No Has patient had a PCN reaction that required hospitalization No Has patient had a PCN reaction occurring within the last 10 years: No If all of the above answers are "NO", then may proceed with Cephalosporin use.    Apixaban Hives    Past Medical History:  Diagnosis Date   Anemia    Arthritis    Breast cancer ([place])    Right mastectomy   Chronic back pain    Coronary atherosclerosis of native coronary artery    Moderate multivessel disease - medical therapy recommended September 2021   Environmental allergies    Essential hypertension    GERD (gastroesophageal reflux disease)    History of bronchitis    History of colon polyps    History of diverticulosis    History of migraine  Panic attacks    Prolapse of vaginal vault after hysterectomy    Seasonal allergies    Urinary frequency    Social History   Socioeconomic History   Marital status: Married    Spouse name: [name]   Number of children: 4   Years of education: 12th   Highest education level: Not on file  Occupational History    Employer: RETIRED  Tobacco Use   Smoking status: Former    Packs/day: 1.50    Years: 30.00    Pack years: 45.00    Types: Cigarettes    Quit date: 04/28/2000    Years since quitting: 20.4   Smokeless tobacco: Never    Tobacco comments:    quit about 93yrs ago  Vaping Use   Vaping Use: Never used  Substance and Sexual Activity   Alcohol use: No   Drug use: No   Sexual activity: Not on file    Comment: hyst  Other Topics Concern   Not on file  Social History Narrative   Patient lives at home with spouse.   Caffeine Use: 2 cups of coffee daily   Social Determinants of Health   Financial Resource Strain: Not on file  Food Insecurity: Not on file  Transportation Needs: Not on file  Physical Activity: Not on file  Stress: Not on file  Social Connections: Not on file  Intimate Partner Violence: Not on file   Family History  Problem Relation Age of Onset   Coronary artery disease Mother    Coronary artery disease Father    Coronary artery disease Brother    Cancer Sister        lung cancer, two sisters   Colon cancer Brother        less than age 75   Lung cancer Sister    Cancer Brother    Emphysema Brother    Past Surgical History:  Procedure Laterality Date   ABDOMINAL HYSTERECTOMY     BACK SURGERY  1981/2011/2014   BLADDER SUSPENSION     BREAST RECONSTRUCTION  2004   with abd tissue   BREAST SURGERY Right    CHOLECYSTECTOMY     COLONOSCOPY  12/2009   RMR: few pancolonic diverticula. next TCS 12/2014   COLONOSCOPY  06/14/2007   LKT:GYBWLSLHTD rectal polyp, status post cold biopsy removal/ Anal canal hemorrhoids/Left-sided diverticula Colonic mucosa appeared normal.Hyperplastic polyp.    COLONOSCOPY  01/18/2004   SKA:JGOTLXB diverticula/Internal hemorrhoids.  Otherwise normal rectum   COLONOSCOPY N/A 04/18/2013   Dr. Gala Romney- normal rectum, scattered left sided diverticula the remainder of the colonic mucosa appeared normal   COLONOSCOPY N/A 08/18/2014   Procedure: COLONOSCOPY;  Surgeon: Daneil Dolin, MD;  [facility];  Service: Endoscopy;  Laterality: N/A;  1000   ESOPHAGOGASTRODUODENOSCOPY  06/14/2007   WIO:[id number] plaques in esophageal mucosa of uncertain  significance brushed and biopsied/Normal stomach, normal first duodenum and second duodenoscopy. KOH negative. esophageal bx c/w GERD   ESOPHAGOGASTRODUODENOSCOPY N/A 08/18/2014   Procedure: ESOPHAGOGASTRODUODENOSCOPY (EGD);  Surgeon: Daneil Dolin, MD;  [facility];  Service: Endoscopy;  Laterality: N/A;   ESOPHAGOGASTRODUODENOSCOPY (EGD) WITH ESOPHAGEAL DILATION N/A 04/18/2013   Dr. Gala Romney- normal, patent appearing, tubular esophagus, normal gastric mucosa, paptent pylorus, normal first and second portion of the duodenum   EYE SURGERY     lasik   FOOT SURGERY Right 2014   d/t plantar fascitis   GIVENS CAPSULE STUDY N/A 07/18/2013   L.Lewis PAC- markedly abnormal appearing gastric  mucosa, sugnificant bile reflux, questionable subucosal small bowel mass versus extrinsic compression in the distal small bowel.- CTE= no small bowel mass or tumor seen.   LEFT HEART CATH AND CORONARY ANGIOGRAPHY N/A 01/16/2020   Procedure: LEFT HEART CATH AND CORONARY ANGIOGRAPHY;  Surgeon: Burnell Blanks, MD;  Location: [place] [facility];  Service: Cardiovascular;  Laterality: N/A;   MALONEY DILATION N/A 08/18/2014   Procedure: Venia Minks DILATION;  Surgeon: Daneil Dolin, MD;  [facility];  Service: Endoscopy;  Laterality: N/A;   MASTECTOMY     right side 12-13 years ago   TOTAL HIP ARTHROPLASTY Left 02/29/2016   TOTAL HIP ARTHROPLASTY Left 02/29/2016   Procedure: TOTAL HIP ARTHROPLASTY ANTERIOR APPROACH;  Surgeon: Frederik Pear, MD;  Location: [place];  Service: Orthopedics;  Laterality: Left;   TOTAL HIP ARTHROPLASTY Right 01/05/2017   Procedure: TOTAL HIP ARTHROPLASTY ANTERIOR APPROACH;  Surgeon: Frederik Pear, MD;  Location: [place];  Service: Orthopedics;  Laterality: Right;  REQUEST 90 MINS     Vanessa Kick, MD 10/10/20 1512

## 2020-10-10 NOTE — ED Triage Notes (Signed)
Pt states that earlier today she became very dizzy and lost vision for about 10 mins. Now feels weak and unwell

## 2020-10-13 LAB — URINE CULTURE: Culture: >=100000 — AB

## 2020-10-16 ENCOUNTER — Ambulatory Visit: Payer: Medicare Other | Admitting: Rheumatology

## 2020-10-22 ENCOUNTER — Ambulatory Visit (HOSPITAL_COMMUNITY)
Admission: RE | Admit: 2020-10-22 | Discharge: 2020-10-22 | Disposition: A | Discharge status: Home / Self Care | Payer: Medicare Other | Source: Ambulatory Visit | Attending: Adult Health Nurse Practitioner | Admitting: Adult Health Nurse Practitioner

## 2020-10-22 ENCOUNTER — Other Ambulatory Visit: Payer: Self-pay | Admitting: Adult Health Nurse Practitioner

## 2020-10-22 ENCOUNTER — Other Ambulatory Visit: Payer: Self-pay

## 2020-10-22 ENCOUNTER — Other Ambulatory Visit (HOSPITAL_COMMUNITY): Payer: Self-pay | Admitting: Adult Health Nurse Practitioner

## 2020-10-22 DIAGNOSIS — R1031 Right lower quadrant pain: Secondary | ICD-10-CM | POA: Diagnosis not present

## 2020-10-22 DIAGNOSIS — I10 Essential (primary) hypertension: Secondary | ICD-10-CM | POA: Diagnosis not present

## 2020-10-22 LAB — POCT I-STAT CREATININE: Creatinine, Ser: 1 mg/dL (ref 0.44–1.00)

## 2020-10-22 MED ORDER — IOHEXOL 300 MG/ML  SOLN
100.0000 mL | Freq: Once | INTRAMUSCULAR | Status: AC | PRN
  Administered 2020-10-22: 100 mL via INTRAVENOUS

## 2020-10-22 MED ORDER — IOHEXOL 9 MG/ML PO SOLN
ORAL | Status: AC
  Filled 2020-10-22: qty 1000

## 2020-10-25 DIAGNOSIS — M461 Sacroiliitis, not elsewhere classified: Secondary | ICD-10-CM | POA: Diagnosis not present

## 2020-10-25 DIAGNOSIS — M549 Dorsalgia, unspecified: Secondary | ICD-10-CM | POA: Diagnosis not present

## 2020-10-25 DIAGNOSIS — I10 Essential (primary) hypertension: Secondary | ICD-10-CM | POA: Diagnosis not present

## 2020-10-30 ENCOUNTER — Other Ambulatory Visit: Payer: Self-pay

## 2020-10-30 ENCOUNTER — Emergency Department (HOSPITAL_COMMUNITY)
Admission: EM | Admit: 2020-10-30 | Discharge: 2020-10-30 | Disposition: A | Discharge status: Home / Self Care | Payer: Medicare Other | Attending: Emergency Medicine | Admitting: Emergency Medicine

## 2020-10-30 ENCOUNTER — Encounter (HOSPITAL_COMMUNITY): Payer: Self-pay

## 2020-10-30 DIAGNOSIS — Z7982 Long term (current) use of aspirin: Secondary | ICD-10-CM | POA: Insufficient documentation

## 2020-10-30 DIAGNOSIS — I2510 Atherosclerotic heart disease of native coronary artery without angina pectoris: Secondary | ICD-10-CM | POA: Diagnosis not present

## 2020-10-30 DIAGNOSIS — I10 Essential (primary) hypertension: Secondary | ICD-10-CM | POA: Insufficient documentation

## 2020-10-30 DIAGNOSIS — Z853 Personal history of malignant neoplasm of breast: Secondary | ICD-10-CM | POA: Diagnosis not present

## 2020-10-30 DIAGNOSIS — Z79899 Other long term (current) drug therapy: Secondary | ICD-10-CM | POA: Diagnosis not present

## 2020-10-30 DIAGNOSIS — M25551 Pain in right hip: Secondary | ICD-10-CM | POA: Insufficient documentation

## 2020-10-30 DIAGNOSIS — Z96643 Presence of artificial hip joint, bilateral: Secondary | ICD-10-CM | POA: Insufficient documentation

## 2020-10-30 DIAGNOSIS — R6889 Other general symptoms and signs: Secondary | ICD-10-CM | POA: Diagnosis not present

## 2020-10-30 DIAGNOSIS — Z743 Need for continuous supervision: Secondary | ICD-10-CM | POA: Diagnosis not present

## 2020-10-30 DIAGNOSIS — R109 Unspecified abdominal pain: Secondary | ICD-10-CM | POA: Diagnosis not present

## 2020-10-30 DIAGNOSIS — Z87891 Personal history of nicotine dependence: Secondary | ICD-10-CM | POA: Insufficient documentation

## 2020-10-30 DIAGNOSIS — J45909 Unspecified asthma, uncomplicated: Secondary | ICD-10-CM | POA: Diagnosis not present

## 2020-10-30 MED ORDER — DEXAMETHASONE SODIUM PHOSPHATE 10 MG/ML IJ SOLN
10.0000 mg | Freq: Once | INTRAMUSCULAR | Status: AC
  Administered 2020-10-30: 10 mg via INTRAMUSCULAR
  Filled 2020-10-30: qty 1

## 2020-10-30 MED ORDER — PREDNISONE 20 MG PO TABS: 40.0000 mg | ORAL_TABLET | Freq: Every day | ORAL | 0 refills | Status: DC

## 2020-10-30 NOTE — ED Triage Notes (Signed)
BIB [facility] c/o R sided flank pain x 1 week

## 2020-10-30 NOTE — Discharge Instructions (Addendum)
I discussed your case with the radiologist There are no fractures / no infections You will need to see the orthopedic surgeon in follow up Prednisone daily for 5 days

## 2020-10-30 NOTE — ED Provider Notes (Signed)
[facility] EMERGENCY DEPARTMENT Provider Note   CSN: [id number] Arrival date & time: 10/30/20  1449     History Chief Complaint  Patient presents with   Flank Pain    [name] is a 80 y.o. female.   Flank Pain  This patient is an 80 year old female, she has a history of hypertension for which she takes a single antihypertensive.  She also has a remote history of breast cancer status post right mastectomy, she took oral therapy for 5 years and has been cancer free for the last 20.  She presents today with her third visit into the healthcare system for lower abdominal and side pain on the right side.  This had an insidious onset 10 days ago, she was initially seen by her primary care physician who ordered a CT scan of the abdomen and pelvis to rule out appendicitis or other pathology causing the pain which was unremarkable.  She was then referred to orthopedics who did a right paraspinal lumbar steroid injection which the patient states has not helped in fact she states it is gotten worse.  She was prescribed hydrocodone which has done nothing other than make her constipated.  She states the pain is severe when she tries to stand it is worse when she sits, it is better when she lays down but it has been present constantly.  She does not have any fevers or chills, no nausea or vomiting, she is upset that the pain is still present without a definite etiology.  Of note the patient has had bilateral hip arthroplasties.    Past Medical History:  Diagnosis Date   Anemia    Arthritis    Breast cancer ([place])    Right mastectomy   Chronic back pain    Coronary atherosclerosis of native coronary artery    Moderate multivessel disease - medical therapy recommended September 2021   Environmental allergies    Essential hypertension    GERD (gastroesophageal reflux disease)    History of bronchitis    History of colon polyps    History of diverticulosis    History of migraine    Panic  attacks    Prolapse of vaginal vault after hysterectomy    Seasonal allergies    Urinary frequency     Patient Active Problem List   Diagnosis Date Noted   Abnormal stress test    Dyspnea on exertion    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/05/2017   Osteoarthritis of right hip 01/02/2017   Primary localized osteoarthritis of left hip 02/29/2016   Primary osteoarthritis of left hip 02/22/2016   Fitting and adjustment of pessary 05/29/2015   Rectal pain, chronic 05/18/2015   History of diverticulosis 05/04/2015   Pelvic pain in female 02/20/2015   Cystocele 02/20/2015   Prolapse of vaginal vault after hysterectomy 02/20/2015   Hematuria 02/20/2015   Abnormal findings on diagnostic imaging of liver and biliary tract 12/06/2014   Dysphagia, pharyngoesophageal phase    Mucosal abnormality of stomach    Diverticulosis of colon without hemorrhage    Rectal bleeding 07/25/2014   Left sided abdominal pain 03/01/2014   Heme positive stool 03/01/2014   Esophageal dysphagia 04/04/2013   Anemia 04/04/2013   Abnormal LFTs 04/04/2013   Change in bowel habits 04/04/2013   Constipation 04/04/2013   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 next TCS 12/2014   COLONOSCOPY  06/14/2007   [id number] rectal polyp, status post cold biopsy removal/ Anal canal hemorrhoids/Left-sided diverticula Colonic mucosa appeared  normal.Hyperplastic polyp.    COLONOSCOPY  01/18/2004   PIR:JJOACZY diverticula/Internal hemorrhoids.  Otherwise normal rectum   COLONOSCOPY N/A 04/18/2013   Dr. Gala Romney- normal rectum, scattered left sided diverticula the remainder of the colonic mucosa appeared normal   COLONOSCOPY N/A 08/18/2014   Procedure: COLONOSCOPY;  Surgeon: Daneil Dolin, MD;  [facility];  Service: Endoscopy;  Laterality: N/A;  1000   ESOPHAGOGASTRODUODENOSCOPY  06/14/2007   SAY:[id number] plaques in esophageal mucosa of uncertain significance brushed and biopsied/Normal stomach, normal first duodenum and second duodenoscopy. KOH negative. esophageal bx c/w GERD   ESOPHAGOGASTRODUODENOSCOPY N/A 08/18/2014   Procedure: ESOPHAGOGASTRODUODENOSCOPY (EGD);  Surgeon: Daneil Dolin, MD;  [facility];  Service: Endoscopy;  Laterality: N/A;   ESOPHAGOGASTRODUODENOSCOPY (EGD) WITH ESOPHAGEAL DILATION N/A 04/18/2013   Dr. Gala Romney- normal, patent appearing, tubular esophagus, normal gastric mucosa, paptent pylorus, normal first and second portion of the duodenum   EYE SURGERY     lasik   FOOT SURGERY Right 2014   d/t plantar fascitis   GIVENS CAPSULE STUDY N/A 07/18/2013   L.Lewis PAC- markedly abnormal appearing gastric mucosa, sugnificant bile reflux, questionable subucosal small bowel mass versus extrinsic compression in the distal small bowel.- CTE= no small bowel mass or tumor seen.   LEFT HEART CATH AND CORONARY ANGIOGRAPHY N/A 01/16/2020   Procedure: LEFT HEART CATH AND CORONARY ANGIOGRAPHY;  Surgeon: Burnell Blanks, MD;  Location: [place] CV LAB;  Service: Cardiovascular;  Laterality: N/A;   MALONEY DILATION N/A 08/18/2014   Procedure: Venia Minks DILATION;  Surgeon: Daneil Dolin, MD;  [facility];  Service: Endoscopy;  Laterality: N/A;   MASTECTOMY     right side 12-13 years ago   TOTAL HIP ARTHROPLASTY Left 02/29/2016   TOTAL HIP ARTHROPLASTY Left 02/29/2016   Procedure:  TOTAL HIP ARTHROPLASTY ANTERIOR APPROACH;  Surgeon: Frederik Pear, MD;  Location: [place];  Service: Orthopedics;  Laterality: Left;   TOTAL HIP ARTHROPLASTY Right 01/05/2017   Procedure: TOTAL HIP ARTHROPLASTY ANTERIOR APPROACH;  Surgeon: Frederik Pear, MD;  Location: [place];  Service: Orthopedics;  Laterality: Right;  REQUEST 25 MINS     OB History     Gravida  3   Para  3   Term  3   Preterm      AB      Living  4      SAB      IAB      Ectopic      Multiple  1   Live Births  4           Family History  Problem Relation Age of Onset   Coronary artery disease Mother    Coronary artery disease Father    Coronary artery disease Brother    Cancer Sister        lung cancer, two  sisters   Colon cancer Brother        less than age 60   Lung cancer Sister    Cancer Brother    Emphysema Brother     Social History   Tobacco Use   Smoking status: Former    Packs/day: 1.50    Years: 30.00    Pack years: 45.00    Types: Cigarettes    Quit date: 04/28/2000    Years since quitting: 20.5   Smokeless tobacco: Never   Tobacco comments:    quit about 70yrs ago  Vaping Use   Vaping Use: Never used  Substance Use Topics   Alcohol use: No   Drug use: No    Home Medications Prior to Admission medications   Medication Sig Start Date End Date Taking? Authorizing Provider  predniSONE (DELTASONE) 20 MG tablet Take 2 tablets (40 mg total) by mouth daily. 10/30/20  Yes Noemi Chapel, MD  amLODipine (NORVASC) 5 MG tablet Take 5 mg by mouth at bedtime.  02/03/14   [provider]  aspirin EC 81 MG tablet Take 1 tablet (81 mg total) by mouth every other day. Swallow whole. 02/22/20   Satira Sark, MD  diphenhydrAMINE (BENADRYL) 25 MG tablet Take 25 mg by mouth daily as needed for allergies.    [provider]  diphenhydrAMINE HCl (ZZZQUIL) 50 MG/30ML LIQD Take 50 mg by mouth at bedtime as needed (sleep).    [provider]  Docusate Calcium (STOOL  SOFTENER PO) Take 1 tablet by mouth daily as needed (constipation).    [provider]  HYDROcodone-acetaminophen (NORCO) 10-325 MG tablet Take 1 tablet by mouth 2 (two) times daily as needed for moderate pain.  08/04/19   [provider]  sertraline (ZOLOFT) 25 MG tablet Take 12.5 mg by mouth daily.     [provider]    Allergies    Penicillins and Apixaban  Review of Systems   Review of Systems  Genitourinary:  Positive for flank pain.  All other systems reviewed and are negative.  Physical Exam Updated Vital Signs BP (!) 171/61 (BP Location: Left Arm)   Pulse 60   Temp 98 F (36.7 C) (Oral)   Resp 18   Ht 1.651 m (5\' 5" )   Wt 72.1 kg   SpO2 98%   BMI 26.45 kg/m   Physical Exam Vitals and nursing note reviewed.  Constitutional:      General: She is not in acute distress.    Appearance: She is well-developed.  HENT:     Head: Normocephalic and atraumatic.     Mouth/Throat:     Pharynx: No oropharyngeal exudate.  Eyes:     General: No scleral icterus.       Right eye: No discharge.        Left eye: No discharge.     Conjunctiva/sclera: Conjunctivae normal.     Pupils: Pupils are equal, round, and reactive to light.  Neck:     Thyroid: No thyromegaly.     Vascular: No JVD.  Cardiovascular:     Rate and Rhythm: Normal rate and regular rhythm.     Heart sounds: Normal heart sounds. No murmur heard.   No friction rub. No gallop.  Pulmonary:     Effort: Pulmonary effort is normal. No respiratory distress.     Breath sounds: Normal breath sounds. No wheezing or rales.  Abdominal:     General: Bowel sounds are normal. There is no distension.  Palpations: Abdomen is soft. There is no mass.     Tenderness: There is no abdominal tenderness.  Musculoskeletal:        General: No tenderness. Normal range of motion.     Cervical back: Normal range of motion and neck supple.     Comments: The patient has pain-free passive range of motion of the  bilateral hips.  She can tolerate internal and external rotation of the hip without any pain.  When I put straight leg pressure on the hip with pressure on the heel going proximal she has significant reproducible pain.  This is only on the right leg.  She has normal strength in all 4 extremities, compartments are extremely soft including the buttocks, gluteal, quad, hamstring, calf.  Lymphadenopathy:     Cervical: No cervical adenopathy.  Skin:    General: Skin is warm and dry.     Findings: No erythema or rash.     Comments: No rashes or skin changes, chaperone present throughout the entire exam  Neurological:     Mental Status: She is alert.     Coordination: Coordination normal.     Comments: The patient is able to straight leg raise bilaterally, normal strength in all 4 extremities, normal sensation all 4 extremities diffusely  Psychiatric:        Behavior: Behavior normal.    ED Results / Procedures / Treatments   Labs (all labs ordered are listed, but only abnormal results are displayed) Labs Reviewed - No data to display  EKG None  Radiology No results found.  Procedures Procedures   Medications Ordered in ED Medications  dexamethasone (DECADRON) injection 10 mg (has no administration in time range)    ED Course  I have reviewed the triage vital signs and the nursing notes.  Pertinent labs & imaging results that were available during my care of the patient were reviewed by me and considered in my medical decision making (see chart for details).    MDM Rules/Calculators/A&P                          The patient was exposed it with regards to skin and the musculoskeletal exam was intricately performed showing tenderness which was reproducible with certain actions on the hip.  I have reviewed the CT scan imaging with Dr. Darcella Cheshire of radiology who also agrees that there is no loosening of the hardware, no effusions, no signs of fracture though there is significant artifact  from the bilateral hip arthroplasties.  Additionally MRI would not yield any benefit given the prior implants.  She has normal vital signs except for some hypertension but no fever or tachycardia, I doubt a septic joint given the exam, she likely needs to have a course of oral steroids and can follow back up with orthopedics.  I do not think any other aggressive evaluation is necessary at this time.  She has absolutely no urinary symptoms, no tenderness over the bladder, this is completely musculoskeletal related in the right hip.  Final Clinical Impression(s) / ED Diagnoses Final diagnoses:  Hip pain, right    Rx / DC Orders ED Discharge Orders          Ordered    predniSONE (DELTASONE) 20 MG tablet  Daily        10/30/20 1532             Noemi Chapel, MD 10/30/20 1534

## 2020-11-02 DIAGNOSIS — M5416 Radiculopathy, lumbar region: Secondary | ICD-10-CM | POA: Diagnosis not present

## 2020-11-02 DIAGNOSIS — M461 Sacroiliitis, not elsewhere classified: Secondary | ICD-10-CM | POA: Diagnosis not present

## 2020-11-02 DIAGNOSIS — I10 Essential (primary) hypertension: Secondary | ICD-10-CM | POA: Diagnosis not present

## 2020-11-05 DIAGNOSIS — M4805 Spinal stenosis, thoracolumbar region: Secondary | ICD-10-CM | POA: Diagnosis not present

## 2020-11-05 DIAGNOSIS — M48061 Spinal stenosis, lumbar region without neurogenic claudication: Secondary | ICD-10-CM | POA: Diagnosis not present

## 2020-11-05 DIAGNOSIS — Z981 Arthrodesis status: Secondary | ICD-10-CM | POA: Diagnosis not present

## 2020-11-05 DIAGNOSIS — M4316 Spondylolisthesis, lumbar region: Secondary | ICD-10-CM | POA: Diagnosis not present

## 2020-11-06 NOTE — Progress Notes (Signed)
Cardiology Office Note  Date: 11/07/2020   ID: Melinda, Gould July 18, 1940, MRN [id number]  PCP:  Melinda Lawrence, NP  Cardiologist:  Melinda Lesches, MD Electrophysiologist:  None   Chief Complaint  Patient presents with   Cardiac follow-up    History of Present Illness: [name] is an 80 y.o. female last seen in October 2021.  She is here for a routine visit.  Reports no angina symptoms or worsening shortness of breath.  I reviewed her medications which are noted below.  I did talk with her about considering at least low-dose statin therapy for cardiac risk reduction and atherosclerotic plaque stabilization.  LDL in May of this year was 116 per PCP.   Past Medical History:  Diagnosis Date   Anemia    Arthritis    Breast cancer ([place])    Right mastectomy   Chronic back pain    Coronary atherosclerosis of native coronary artery    Moderate multivessel disease - medical therapy recommended September 2021   Environmental allergies    Essential hypertension    GERD (gastroesophageal reflux disease)    History of bronchitis    History of colon polyps    History of diverticulosis    History of migraine    Panic attacks    Prolapse of vaginal vault after hysterectomy    Seasonal allergies    Urinary frequency     Past Surgical History:  Procedure Laterality Date   ABDOMINAL HYSTERECTOMY     BACK SURGERY  1981/2011/2014   BLADDER SUSPENSION     BREAST RECONSTRUCTION  2004   with abd tissue   BREAST SURGERY Right    CHOLECYSTECTOMY     COLONOSCOPY  12/2009   RMR: few pancolonic diverticula. next TCS 12/2014   COLONOSCOPY  06/14/2007   OBS:[id number] rectal polyp, status post cold biopsy removal/ Anal canal hemorrhoids/Left-sided diverticula Colonic mucosa appeared normal.Hyperplastic polyp.    COLONOSCOPY  01/18/2004   TML:[id number] diverticula/Internal hemorrhoids.  Otherwise normal rectum   COLONOSCOPY N/A 04/18/2013   Dr. Gala Gould- normal rectum, scattered  left sided diverticula the remainder of the colonic mucosa appeared normal   COLONOSCOPY N/A 08/18/2014   Procedure: COLONOSCOPY;  Surgeon: Melinda Dolin, MD;  [facility];  Service: Endoscopy;  Laterality: N/A;  1000   ESOPHAGOGASTRODUODENOSCOPY  06/14/2007   SFK:[id number] plaques in esophageal mucosa of uncertain significance brushed and biopsied/Normal stomach, normal first duodenum and second duodenoscopy. KOH negative. esophageal bx c/w GERD   ESOPHAGOGASTRODUODENOSCOPY N/A 08/18/2014   Procedure: ESOPHAGOGASTRODUODENOSCOPY (EGD);  Surgeon: Melinda Dolin, MD;  [facility];  Service: Endoscopy;  Laterality: N/A;   ESOPHAGOGASTRODUODENOSCOPY (EGD) WITH ESOPHAGEAL DILATION N/A 04/18/2013   Dr. Gala Gould- normal, patent appearing, tubular esophagus, normal gastric mucosa, paptent pylorus, normal first and second portion of the duodenum   EYE SURGERY     lasik   FOOT SURGERY Right 2014   d/t plantar fascitis   GIVENS CAPSULE STUDY N/A 07/18/2013   [name] PAC- markedly abnormal appearing gastric mucosa, sugnificant bile reflux, questionable subucosal small bowel mass versus extrinsic compression in the distal small bowel.- CTE= no small bowel mass or tumor seen.   LEFT HEART CATH AND CORONARY ANGIOGRAPHY N/A 01/16/2020   Procedure: LEFT HEART CATH AND CORONARY ANGIOGRAPHY;  Surgeon: Melinda Blanks, MD;  Location: [place] CV LAB;  Service: Cardiovascular;  Laterality: N/A;   MALONEY DILATION N/A 08/18/2014   Procedure: [name] DILATION;  Surgeon: Melinda Dolin, MD;  [facility];  Service: Endoscopy;  Laterality: N/A;   MASTECTOMY     right side 12-13 years ago   TOTAL HIP ARTHROPLASTY Left 02/29/2016   TOTAL HIP ARTHROPLASTY Left 02/29/2016   Procedure: TOTAL HIP ARTHROPLASTY ANTERIOR APPROACH;  Surgeon: Melinda Pear, MD;  Location: [place];  Service: Orthopedics;  Laterality: Left;   TOTAL HIP ARTHROPLASTY Right 01/05/2017   Procedure: TOTAL HIP  ARTHROPLASTY ANTERIOR APPROACH;  Surgeon: Melinda Pear, MD;  Location: [place];  Service: Orthopedics;  Laterality: Right;  REQUEST 90 MINS    Current Outpatient Medications  Medication Sig Dispense Refill   albuterol (VENTOLIN HFA) 108 (90 Base) MCG/ACT inhaler ProAir HFA 90 mcg/actuation aerosol inhaler  INHALE TWO PUFFS EVERY 6 HOURS AS NEEDED FOR SHORTNESS OF BREATH AND FOR WHEEZING.     amLODipine (NORVASC) 5 MG tablet Take 5 mg by mouth at bedtime.   5   aspirin EC 81 MG tablet Take 1 tablet (81 mg total) by mouth every other day. Swallow whole. 45 tablet 3   diphenhydrAMINE (BENADRYL) 25 MG tablet Take 25 mg by mouth daily as needed for allergies.     diphenhydrAMINE HCl (ZZZQUIL) 50 MG/30ML LIQD Take 50 mg by mouth at bedtime as needed (sleep).     HYDROcodone-acetaminophen (NORCO) 10-325 MG tablet Take 1 tablet by mouth 2 (two) times daily as needed for moderate pain.      metoprolol succinate (TOPROL-XL) 25 MG 24 hr tablet Take by mouth.     omeprazole (PRILOSEC) 20 MG capsule Take by mouth.     rosuvastatin (CRESTOR) 5 MG tablet Take 1 tablet (5 mg total) by mouth daily. 90 tablet 1   Docusate Calcium (STOOL SOFTENER PO) Take 1 tablet by mouth daily as needed (constipation). (Patient not taking: Reported on 11/07/2020)     predniSONE (DELTASONE) 20 MG tablet Take 2 tablets (40 mg total) by mouth daily. (Patient not taking: Reported on 11/07/2020) 10 tablet 0   sertraline (ZOLOFT) 25 MG tablet Take 12.5 mg by mouth daily.  (Patient not taking: Reported on 11/07/2020)     No current facility-administered medications for this visit.   Allergies:  Penicillins and Apixaban   ROS: Indigestion, taking PPI.  Also chronic lower back pain.  Physical Exam: VS:  BP 124/60   Pulse 68   Ht 5' 5.5" (1.664 m)   Wt 159 lb 9.6 oz (72.4 kg)   SpO2 97%   BMI 26.15 kg/m , BMI Body mass index is 26.15 kg/m.  Wt Readings from Last 3 Encounters:  11/07/20 159 lb 9.6 oz (72.4 kg)  10/30/20 158 lb  15.2 oz (72.1 kg)  02/22/20 159 lb (72.1 kg)    General: Patient appears comfortable at rest. HEENT: Conjunctiva and lids normal, wearing a mask. Neck: Supple, no elevated JVP or carotid bruits, no thyromegaly. Lungs: Clear to auscultation, nonlabored breathing at rest. Cardiac: Regular rate and rhythm, no S3, 1/6 systolic murmur, no pericardial rub. Extremities: No pitting edema.  ECG:  An ECG dated 01/16/2020 was personally reviewed today and demonstrated:  Sinus rhythm with PAC, decreased R wave progression.  Recent Labwork: 01/13/2020: BUN 22; Hemoglobin 11.0; Platelets 294; Potassium 4.0; Sodium 136 10/22/2020: Creatinine, Ser 1.00   Other Studies Reviewed Today:  Echocardiogram 01/06/2020:  1. Left ventricular ejection fraction, by estimation, is 65 to 70%. The  left ventricle has normal function. The left ventricle has no regional  wall motion abnormalities. Left ventricular diastolic parameters are  indeterminate.  2. Right ventricular systolic function is normal. The right ventricular  size is normal. Tricuspid regurgitation signal is inadequate for assessing  PA pressure.   3. Left atrial size was mildly dilated.   4. The mitral valve is grossly normal. Mild mitral valve regurgitation.   5. The aortic valve is tricuspid. Aortic valve regurgitation is not  visualized.   6. The inferior vena cava is normal in size with greater than 50%  respiratory variability, suggesting right atrial pressure of 3 mmHg.   Lexiscan Myoview 01/06/2020: No diagnostic ST segment changes to indicate ischemia. Occasional PACs. Small, mild intensity, apical anteroseptal defect that is partially reversible in the setting of breast attenuation, mild ischemia not excluded. TID ratio increased at 1.31 so cannot exclude the possibility of more diffuse subendocardial ischemia. This is a high risk study due to increased TID ratio. Nuclear stress EF: 66%.   Cardiac catheterization 01/16/2020: Mid RCA  lesion is 40% stenosed. Prox RCA lesion is 20% stenosed. Dist Cx lesion is 80% stenosed. Prox Cx lesion is 50% stenosed. Mid LAD lesion is 20% stenosed.   1. Moderate stenosis mid Circumflex. This lesion is not flow limiting by functional assessment (DFR 0.96). The small caliber distal Circumflex has a severe stenosis but this vessel appears to be too small for PCI (1.75 mm vessel). 2. Mild plaque in the LAD 3. The RCA is a large dominant vessel with mild to moderate calcified plaque in the mid and distal vessel.   Recommendations: Medical management of CAD. The stenosis in the mid Circumflex is moderate angiographically but by functional flow wire assessment is not flow limiting (DFR 0.96). The small caliber distal Circumflex has a severe stenosis but this vessel appears to be too small for PCI (1.75 mm vessel).   Assessment and Plan:  1.  Moderate multivessel CAD with plan for continued medical therapy.  No active angina at this time.  Continue aspirin, Toprol-XL, and Norvasc.  Also starting Crestor 5 mg daily.  2.  Essential hypertension, no change in present regimen.  Blood pressure control is good today.  Medication Adjustments/Labs and Tests Ordered: Current medicines are reviewed at length with the patient today.  Concerns regarding medicines are outlined above.   Tests Ordered: No orders of the defined types were placed in this encounter.   Medication Changes: Meds ordered this encounter  Medications   rosuvastatin (CRESTOR) 5 MG tablet    Sig: Take 1 tablet (5 mg total) by mouth daily.    Dispense:  90 tablet    Refill:  1    11/07/2020 NEW     Disposition:  Follow up  6 months.  Signed, Satira Sark, MD, [facility] 11/07/2020 9:50 AM    [facility], [place], [place] [address] Phone: [phone]; Fax: [phone]

## 2020-11-07 ENCOUNTER — Ambulatory Visit: Payer: Medicare Other | Admitting: Cardiology

## 2020-11-07 ENCOUNTER — Encounter: Payer: Self-pay | Admitting: Cardiology

## 2020-11-07 VITALS — BP 124/60 | HR 68 | Ht 65.5 in | Wt 159.6 lb

## 2020-11-07 DIAGNOSIS — I25119 Atherosclerotic heart disease of native coronary artery with unspecified angina pectoris: Secondary | ICD-10-CM

## 2020-11-07 DIAGNOSIS — I10 Essential (primary) hypertension: Secondary | ICD-10-CM

## 2020-11-07 MED ORDER — ROSUVASTATIN CALCIUM 5 MG PO TABS: 5.0000 mg | ORAL_TABLET | Freq: Every day | ORAL | 1 refills | Status: DC

## 2020-11-07 NOTE — Patient Instructions (Addendum)
Medication Instructions:  Your physician has recommended you make the following change in your medication:  Start crestor 5 mg by mouth daily at bedtime Continue other medications the same  Labwork: none  Testing/Procedures: none  Follow-Up: Your physician recommends that you schedule a follow-up appointment in: 6 months  Any Other Special Instructions Will Be Listed Below (If Applicable).  If you need a refill on your cardiac medications before your next appointment, please call your pharmacy.

## 2020-11-08 DIAGNOSIS — I10 Essential (primary) hypertension: Secondary | ICD-10-CM | POA: Diagnosis not present

## 2020-11-08 DIAGNOSIS — M5416 Radiculopathy, lumbar region: Secondary | ICD-10-CM | POA: Diagnosis not present

## 2020-11-08 DIAGNOSIS — M47816 Spondylosis without myelopathy or radiculopathy, lumbar region: Secondary | ICD-10-CM | POA: Diagnosis not present

## 2020-11-14 ENCOUNTER — Ambulatory Visit: Payer: Medicare Other | Admitting: Rheumatology

## 2020-11-29 ENCOUNTER — Telehealth: Payer: Self-pay | Admitting: Internal Medicine

## 2020-11-29 ENCOUNTER — Other Ambulatory Visit: Payer: Self-pay | Admitting: *Deleted

## 2020-11-29 DIAGNOSIS — K625 Hemorrhage of anus and rectum: Secondary | ICD-10-CM

## 2020-11-29 NOTE — Telephone Encounter (Signed)
Pt is on the wait list  ?

## 2020-11-29 NOTE — Telephone Encounter (Signed)
Lmom for pt to call me back. 

## 2020-11-29 NOTE — Telephone Encounter (Signed)
Spoke to pt.  She is aware that she needs STAT labs.  Pt informed to stop NSAIDS.  Pt denies any usage.  Pt advised to back diet to clear liquids.    Dr. Gala Romney:  [facility] first available appointment 03/26/2021.  However, you have an opening on 01/11/2021.  Would you like to see pt instead and I can get Manuela Schwartz or Erline Levine to move appointment up if we get any cancellations?

## 2020-11-29 NOTE — Telephone Encounter (Signed)
Pt said she was in pain and bleeding. She wants to speak with RMR nurse. [phone]

## 2020-11-29 NOTE — Telephone Encounter (Signed)
Spoke to pt.  She has another appt on 9/16 with Dr. Estanislado Pandy.  She made appt for 9/20 with Dr. Gala Romney.  Pt made aware that we will contact her if any openings prior to then.  She was advised to go to ER if worsening of rectal bleeding.  Pt voiced understanding.  Erline Levine and Manuela Schwartz:  Can you move pt up if any cancellations per Dr. Gala Romney?

## 2020-11-29 NOTE — Telephone Encounter (Signed)
Spoke to pt.  She informed me that she went to ER 1 month ago and was diagnosed with inflammation in back.  Received steroid shot while there.  Started having real bad pain in right side and intestines yesterday.  Pt stated "felt something moving in side/intestines".  Passed many blood clots about the size of quarter along with bright red blood.  No constipation prior to this.  Pt with rectal bleeding today but not as much as yesterday.  Sees the blood in her toilet water.  No fever or n/v.  Pt does have chills and feels weak.  No recent blood work done.  Last CT scan about a month ago with no findings per pt.

## 2020-11-30 LAB — CBC WITH DIFFERENTIAL/PLATELET
Basophils Absolute: 0.1 10*3/uL (ref 0.0–0.2)
Basos: 1 %
EOS (ABSOLUTE): 0.2 10*3/uL (ref 0.0–0.4)
Eos: 2 %
Hematocrit: 32.3 % — ABNORMAL LOW (ref 34.0–46.6)
Hemoglobin: 11.4 g/dL (ref 11.1–15.9)
Immature Grans (Abs): 0 10*3/uL (ref 0.0–0.1)
Immature Granulocytes: 0 %
Lymphocytes Absolute: 4 10*3/uL — ABNORMAL HIGH (ref 0.7–3.1)
Lymphs: 40 %
MCH: 32.2 pg (ref 26.6–33.0)
MCHC: 35.3 g/dL (ref 31.5–35.7)
MCV: 91 fL (ref 79–97)
Monocytes Absolute: 0.7 10*3/uL (ref 0.1–0.9)
Monocytes: 7 %
Neutrophils Absolute: 5.2 10*3/uL (ref 1.4–7.0)
Neutrophils: 50 %
Platelets: 330 10*3/uL (ref 150–450)
RBC: 3.54 x10E6/uL — ABNORMAL LOW (ref 3.77–5.28)
RDW: 11.7 % (ref 11.7–15.4)
WBC: 10.2 10*3/uL (ref 3.4–10.8)

## 2020-12-23 ENCOUNTER — Encounter (HOSPITAL_COMMUNITY): Payer: Self-pay | Admitting: Emergency Medicine

## 2020-12-23 ENCOUNTER — Other Ambulatory Visit: Payer: Self-pay

## 2020-12-23 ENCOUNTER — Emergency Department (HOSPITAL_COMMUNITY)
Admission: EM | Admit: 2020-12-23 | Discharge: 2020-12-23 | Disposition: A | Discharge status: Home / Self Care | Payer: Medicare Other | Attending: Emergency Medicine | Admitting: Emergency Medicine

## 2020-12-23 DIAGNOSIS — R220 Localized swelling, mass and lump, head: Secondary | ICD-10-CM | POA: Diagnosis present

## 2020-12-23 DIAGNOSIS — Z5321 Procedure and treatment not carried out due to patient leaving prior to being seen by health care provider: Secondary | ICD-10-CM | POA: Insufficient documentation

## 2020-12-23 NOTE — ED Triage Notes (Signed)
Pt arrived via POV c/o right side facial swelling. Pt had dental surgery in May for cyst removal. Pt reports swelling began this past Tuesday and was prescribed Antibiotics by her provider without any relief. No difficulty breathing reported.

## 2020-12-28 NOTE — Progress Notes (Signed)
Office Visit Note  Patient: [name]             Date of Birth: 1940-05-27           MRN: [id number]             PCP: Pablo Lawrence, NP Referring: Celedonio Miyamoto, MD Visit Date: 01/11/2021 Occupation: '@GUAROCC'$ @  Subjective:  Pain and swelling in hands and feet   History of Present Illness: [name] is a 80 y.o. female in consultation per request of her PCP.  According to the patient her symptoms a started about 5 years ago with pain and swelling in her hands and feet.  At the time she was evaluated by Dr. Amil Amen and followed mostly by his PA.  She states she was given the diagnosis of rheumatoid arthritis.  She was a started on methotrexate but she had a lot of side effects from the methotrexate and she discontinued the medication.  She states she stopped going there and just stayed at home.  She was involved taking care of her husband who eventually passed away from the complications of Alzheimer's and pneumonia.  According the patient her swelling has been worse this year.  She was recently given a prednisone taper by her PCP which helped for a short time and the swelling recurred.  Besides her hands and her feet nothing else is swollen.  Patient states developed infection in her gums.  She had to had her teeth extracted.  She is a still on antibiotics.  She will need dental implants soon.  She is gravida 3 para 2 miscarriages 0.  There is no family history of rheumatoid arthritis or autoimmune disease.  Activities of Daily Living:  Patient reports morning stiffness for all day. Patient Reports nocturnal pain.  Difficulty dressing/grooming: Denies Difficulty climbing stairs: Denies Difficulty getting out of chair: Denies Difficulty using hands for taps, buttons, cutlery, and/or writing: Reports  Review of Systems  Constitutional:  Positive for fatigue.  HENT:  Positive for mouth dryness. Negative for mouth sores and nose dryness.   Eyes:  Negative for pain, itching and  dryness.  Respiratory:  Negative for shortness of breath and difficulty breathing.   Cardiovascular:  Negative for chest pain and palpitations.  Gastrointestinal:  Negative for blood in stool, constipation and diarrhea.  Endocrine: Negative for increased urination.  Genitourinary:  Negative for difficulty urinating.  Musculoskeletal:  Positive for joint pain, joint pain, joint swelling and morning stiffness. Negative for myalgias, muscle tenderness and myalgias.  Skin:  Negative for color change, rash and redness.  Allergic/Immunologic: Negative for susceptible to infections.  Neurological:  Positive for numbness. Negative for dizziness, headaches, memory loss and weakness.  Hematological:  Positive for bruising/bleeding tendency.  Psychiatric/Behavioral:  Negative for confusion.    PMFS History:  Patient Active Problem List   Diagnosis Date Noted   Abnormal stress test    Dyspnea on exertion    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/05/2017   Osteoarthritis of right hip 01/02/2017   Primary localized osteoarthritis of left hip 02/29/2016   Primary osteoarthritis of left hip 02/22/2016   Fitting and adjustment of pessary 05/29/2015   Rectal pain, chronic 05/18/2015   History of diverticulosis 05/04/2015   Pelvic pain in female 02/20/2015   Cystocele 02/20/2015   Prolapse of vaginal vault after hysterectomy 02/20/2015   Hematuria 02/20/2015   Abnormal findings on diagnostic imaging of liver and biliary tract 12/06/2014   Dysphagia, pharyngoesophageal phase  Mucosal abnormality of stomach    Diverticulosis of colon without hemorrhage    Rectal bleeding 07/25/2014   Left sided abdominal pain 03/01/2014   Heme positive stool 03/01/2014   Esophageal dysphagia 04/04/2013   Anemia 04/04/2013   Abnormal LFTs 04/04/2013   Change in bowel habits 04/04/2013   Constipation 04/04/2013   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 01/16/2013   Degeneration of intervertebral disc, site  unspecified 12/15/2012   Shortness of breath 01/14/2012   Hypertensive heart disease 01/14/2012   Coronary atherosclerosis of native coronary artery 12/14/2009   ASTHMA 12/14/2009   GERD 12/14/2009   BREAST CANCER, HX OF 12/14/2009    Past Medical History:  Diagnosis Date   Anemia    Arthritis    Breast cancer ([place])    Right mastectomy   Chronic back pain    Coronary atherosclerosis of native coronary artery    Moderate multivessel disease - medical therapy recommended September 2021   Environmental allergies    Essential hypertension    GERD (gastroesophageal reflux disease)    History of bronchitis    History of colon polyps    History of diverticulosis    History of migraine    Panic attacks    Prolapse of vaginal vault after hysterectomy    Seasonal allergies    Urinary frequency     Family History  Problem Relation Age of Onset   Coronary artery disease Mother    Coronary artery disease Father    Cancer Sister        lung cancer, two sisters   Lung cancer Sister    Coronary artery disease Brother    Colon cancer Brother        less than age 85   Cancer Brother    Emphysema Brother    Diabetes Son    Healthy Son    Healthy Son    Healthy Son    Healthy Son    Past Surgical History:  Procedure Laterality Date   ABDOMINAL HYSTERECTOMY     BACK SURGERY  1981/2011/2014   BLADDER SUSPENSION     BREAST RECONSTRUCTION  2004   with abd tissue   BREAST SURGERY Right    CHOLECYSTECTOMY     COLONOSCOPY  12/2009   RMR: few pancolonic diverticula. next TCS 12/2014   COLONOSCOPY  06/14/2007   [id number] rectal polyp, status post cold biopsy removal/ Anal canal hemorrhoids/Left-sided diverticula Colonic mucosa appeared normal.Hyperplastic polyp.    COLONOSCOPY  01/18/2004   [id number] diverticula/Internal hemorrhoids.  Otherwise normal rectum   COLONOSCOPY N/A 04/18/2013   Dr. Gala Romney- normal rectum, scattered left sided diverticula the remainder of the colonic  mucosa appeared normal   COLONOSCOPY N/A 08/18/2014   Procedure: COLONOSCOPY;  Surgeon: Daneil Dolin, MD;  [facility];  Service: Endoscopy;  Laterality: N/A;  1000   ESOPHAGOGASTRODUODENOSCOPY  06/14/2007   [id number] plaques in esophageal mucosa of uncertain significance brushed and biopsied/Normal stomach, normal first duodenum and second duodenoscopy. KOH negative. esophageal bx c/w GERD   ESOPHAGOGASTRODUODENOSCOPY N/A 08/18/2014   Procedure: ESOPHAGOGASTRODUODENOSCOPY (EGD);  Surgeon: Daneil Dolin, MD;  [facility];  Service: Endoscopy;  Laterality: N/A;   ESOPHAGOGASTRODUODENOSCOPY (EGD) WITH ESOPHAGEAL DILATION N/A 04/18/2013   Dr. Gala Romney- normal, patent appearing, tubular esophagus, normal gastric mucosa, paptent pylorus, normal first and second portion of the duodenum   EYE SURGERY     lasik   FOOT SURGERY Right 2014   d/t plantar fascitis   GIVENS  CAPSULE STUDY N/A 07/18/2013   L.Lewis PAC- markedly abnormal appearing gastric mucosa, sugnificant bile reflux, questionable subucosal small bowel mass versus extrinsic compression in the distal small bowel.- CTE= no small bowel mass or tumor seen.   LEFT HEART CATH AND CORONARY ANGIOGRAPHY N/A 01/16/2020   Procedure: LEFT HEART CATH AND CORONARY ANGIOGRAPHY;  Surgeon: Burnell Blanks, MD;  Location: [facility];  Service: Cardiovascular;  Laterality: N/A;   MALONEY DILATION N/A 08/18/2014   Procedure: Venia Minks DILATION;  Surgeon: Daneil Dolin, MD;  [facility];  Service: Endoscopy;  Laterality: N/A;   MASTECTOMY     right side 12-13 years ago   MOUTH SURGERY     cyst removed in June 2022.   TOTAL HIP ARTHROPLASTY Left 02/29/2016   TOTAL HIP ARTHROPLASTY Left 02/29/2016   Procedure: TOTAL HIP ARTHROPLASTY ANTERIOR APPROACH;  Surgeon: Frederik Pear, MD;  Location: [facility];  Service: Orthopedics;  Laterality: Left;   TOTAL HIP ARTHROPLASTY Right 01/05/2017   Procedure: TOTAL HIP  ARTHROPLASTY ANTERIOR APPROACH;  Surgeon: Frederik Pear, MD;  Location: [facility];  Service: Orthopedics;  Laterality: Right;  REQUEST 33 MINS   Social History   Social History Narrative   Patient lives at home with spouse.   Caffeine Use: 2 cups of coffee daily   Immunization History  Administered Date(s) Administered   Influenza,inj,Quad PF,6+ Mos 01/16/2013   Moderna Sars-Covid-2 Vaccination 06/09/2019, 07/08/2019     Objective: Vital Signs: BP (!) 162/73 (BP Location: Left Arm, Patient Position: Sitting, Cuff Size: Normal)   Pulse (!) 49   Ht 5' 4.5" (1.638 m)   Wt 160 lb 3.2 oz (72.7 kg)   BMI 27.07 kg/m    Physical Exam Vitals and nursing note reviewed.  Constitutional:      Appearance: She is well-developed.  HENT:     Head: Normocephalic and atraumatic.  Eyes:     Conjunctiva/sclera: Conjunctivae normal.  Cardiovascular:     Rate and Rhythm: Normal rate and regular rhythm.     Heart sounds: Normal heart sounds.  Pulmonary:     Effort: Pulmonary effort is normal.     Breath sounds: Normal breath sounds.  Abdominal:     General: Bowel sounds are normal.     Palpations: Abdomen is soft.  Musculoskeletal:     Cervical back: Normal range of motion.  Lymphadenopathy:     Cervical: No cervical adenopathy.  Skin:    General: Skin is warm and dry.     Capillary Refill: Capillary refill takes less than 2 seconds.  Neurological:     Mental Status: She is alert and oriented to person, place, and time.  Psychiatric:        Behavior: Behavior normal.     Musculoskeletal Exam: C-spine was in good range of motion.  She had fusion of her lumbar spine and has surgical scar from that.  Shoulder joints and elbow joints with good range of motion.  She had bilateral PIP and DIP thickening no MCP thickening or synovitis was noted.  There was no synovitis of her wrist joints.  Bilateral hip joints are replaced and had limited range of motion.  Knee joints with good range of motion  without any warmth swelling or effusion.  She had bilateral pes cavus and hammertoes.  No synovitis was noted.  CDAI Exam: CDAI Score: -- Patient Global: --; Provider Global: -- Swollen: --; Tender: -- Joint Exam 01/11/2021   No joint exam has been documented for  this visit   There is currently no information documented on the homunculus. Go to the Rheumatology activity and complete the homunculus joint exam.  Investigation: No additional findings.  Imaging: No results found.  Recent Labs: Lab Results  Component Value Date   WBC 10.2 11/29/2020   HGB 11.4 11/29/2020   PLT 330 11/29/2020   NA 136 01/13/2020   K 4.0 01/13/2020   CL 103 01/13/2020   CO2 24 01/13/2020   GLUCOSE 112 (H) 01/13/2020   BUN 22 01/13/2020   CREATININE 1.00 10/22/2020   BILITOT 0.3 08/25/2014   ALKPHOS 79 08/25/2014   AST 29 08/25/2014   ALT 22 08/25/2014   PROT 6.9 08/25/2014   ALBUMIN 4.0 08/25/2014   CALCIUM 9.6 01/13/2020   GFRAA 50 (L) 01/13/2020    Speciality Comments: No specialty comments available.  Procedures:  No procedures performed Allergies: Penicillins and Apixaban   Assessment / Plan:     Visit Diagnoses: Seropositive rheumatoid arthritis ([place]) - RF 160, anti-CCP 74, U/s right wrist synovitis. Dx at [facility] -he has positive rheumatoid factor and positive anti-CCP in the past.  She was given the diagnosis of rheumatoid arthritis at [facility] rheumatology in the past.  According to the records she was treated with methotrexate, leflunomide and also Enbrel and Humira but all the medications were discontinued due to side effects or inadequate response.  She states she has not been on any treatment for the last 3 months.  According to chart she recently had prednisone in July.  Patient does not recall taking prednisone in July.  She had no synovitis on examination.  Clinical findings are consistent with osteoarthritis.  I will obtain labs today.  Plan: Sedimentation rate  High  risk medication use - Previous therapy: Enbrel-d/c infections/allergic rxn?, MTX-nausea, Arava-GI SE, Humira-GI SE/inadequate response -in anticipation to start her on immunosuppressive therapy I will obtain following labs today.  Plan: COMPLETE METABOLIC PANEL WITH GFR, CK, TSH, Hepatitis B core antibody, IgM, Hepatitis B surface antigen, Hepatitis C antibody, QuantiFERON-TB Gold Plus, Serum protein electrophoresis with reflex, IgG, IgA, IgM, Glucose 6 phosphate dehydrogenase  Pain in both hands -she complains of pain and discomfort in her bilateral hands.  No synovitis was noted.  PIP and DIP thickening was noted.  Plan: XR Hand 2 View Right, XR Hand 2 View Left, x-ray of bilateral hands were consistent osteoarthritis.  Chondrocalcinosis was noted.  Uric acid, Rheumatoid factor, Cyclic citrul peptide antibody, IgG  Status post bilateral total hip replacement - 2017, 2018 Dr. Mayer Camel.  She had limited range of bilateral hip joints.  Pain in both feet -she complains of pain and swelling in her bilateral feet.  No synovitis was noted.  She had bilateral pes cavus and severe osteoarthritis in her PIPs and DIPs with hammertoes bilaterally.  Plan: XR Foot 2 Views Right, XR Foot 2 Views Left.  X-rays were consistent with osteoarthritis.  Pes cavus  DDD (degenerative disc disease), lumbar - status post fusion by Dr. Lynann Bologna.  She had limited range of motion of her lumbar spine without much discomfort.   Dental infection-patient states she had infection in her gumline and also her front teeth.  She had to had all her teeth extracted.  She has been on antibiotics and is still continuing to take antibiotics.  She will also need dental implants.  I will be hesitant to immunosuppressed her until her dental work-up is finished.  I also do not see any synovitis on examination today.  Other medical problems are listed as follows:  Elevated LFTs-I do not have recent values available.  Gastroesophageal reflux  disease without esophagitis  Dysphagia, pharyngoesophageal phase-patient states she has had esophageal stretching in the past.  Abnormal findings on diagnostic imaging of liver and biliary tract  Diverticulosis of colon without hemorrhage - Diverticulitis ~40 yr ago  History of coronary atherosclerosis  History of asthma  Prolapse of vaginal vault after hysterectomy  Other fatigue - Plan: CK, TSH  Orders: Orders Placed This Encounter  Procedures   XR Hand 2 View Right   XR Hand 2 View Left   XR Foot 2 Views Right   XR Foot 2 Views Left   COMPLETE METABOLIC PANEL WITH GFR   Sedimentation rate   CK   TSH   Uric acid   Rheumatoid factor   Cyclic citrul peptide antibody, IgG   Hepatitis B core antibody, IgM   Hepatitis B surface antigen   Hepatitis C antibody   QuantiFERON-TB Gold Plus   Serum protein electrophoresis with reflex   IgG, IgA, IgM   Glucose 6 phosphate dehydrogenase    No orders of the defined types were placed in this encounter.    Follow-Up Instructions: Return for Rheumatoid arthritis.   Bo Merino, MD  Note - This record has been created using Editor, commissioning.  Chart creation errors have been sought, but may not always  have been located. Such creation errors do not reflect on  the standard of medical care.

## 2021-01-11 ENCOUNTER — Encounter: Payer: Self-pay | Admitting: Rheumatology

## 2021-01-11 ENCOUNTER — Ambulatory Visit: Payer: Self-pay

## 2021-01-11 ENCOUNTER — Ambulatory Visit: Payer: Medicare Other | Admitting: Rheumatology

## 2021-01-11 ENCOUNTER — Other Ambulatory Visit: Payer: Self-pay

## 2021-01-11 VITALS — BP 162/73 | HR 49 | Ht 64.5 in | Wt 160.2 lb

## 2021-01-11 DIAGNOSIS — M79671 Pain in right foot: Secondary | ICD-10-CM | POA: Diagnosis not present

## 2021-01-11 DIAGNOSIS — R1314 Dysphagia, pharyngoesophageal phase: Secondary | ICD-10-CM

## 2021-01-11 DIAGNOSIS — N993 Prolapse of vaginal vault after hysterectomy: Secondary | ICD-10-CM

## 2021-01-11 DIAGNOSIS — M79641 Pain in right hand: Secondary | ICD-10-CM | POA: Diagnosis not present

## 2021-01-11 DIAGNOSIS — Z79899 Other long term (current) drug therapy: Secondary | ICD-10-CM | POA: Diagnosis not present

## 2021-01-11 DIAGNOSIS — K5730 Diverticulosis of large intestine without perforation or abscess without bleeding: Secondary | ICD-10-CM

## 2021-01-11 DIAGNOSIS — M5136 Other intervertebral disc degeneration, lumbar region: Secondary | ICD-10-CM

## 2021-01-11 DIAGNOSIS — Z96643 Presence of artificial hip joint, bilateral: Secondary | ICD-10-CM

## 2021-01-11 DIAGNOSIS — M1612 Unilateral primary osteoarthritis, left hip: Secondary | ICD-10-CM

## 2021-01-11 DIAGNOSIS — M48061 Spinal stenosis, lumbar region without neurogenic claudication: Secondary | ICD-10-CM

## 2021-01-11 DIAGNOSIS — Q667 Congenital pes cavus, unspecified foot: Secondary | ICD-10-CM

## 2021-01-11 DIAGNOSIS — K219 Gastro-esophageal reflux disease without esophagitis: Secondary | ICD-10-CM

## 2021-01-11 DIAGNOSIS — Z96641 Presence of right artificial hip joint: Secondary | ICD-10-CM

## 2021-01-11 DIAGNOSIS — K047 Periapical abscess without sinus: Secondary | ICD-10-CM

## 2021-01-11 DIAGNOSIS — R932 Abnormal findings on diagnostic imaging of liver and biliary tract: Secondary | ICD-10-CM

## 2021-01-11 DIAGNOSIS — M059 Rheumatoid arthritis with rheumatoid factor, unspecified: Secondary | ICD-10-CM

## 2021-01-11 DIAGNOSIS — Z8709 Personal history of other diseases of the respiratory system: Secondary | ICD-10-CM

## 2021-01-11 DIAGNOSIS — R5383 Other fatigue: Secondary | ICD-10-CM

## 2021-01-11 DIAGNOSIS — R7989 Other specified abnormal findings of blood chemistry: Secondary | ICD-10-CM

## 2021-01-11 DIAGNOSIS — M79642 Pain in left hand: Secondary | ICD-10-CM

## 2021-01-11 DIAGNOSIS — M79672 Pain in left foot: Secondary | ICD-10-CM | POA: Diagnosis not present

## 2021-01-11 DIAGNOSIS — Z8679 Personal history of other diseases of the circulatory system: Secondary | ICD-10-CM

## 2021-01-15 ENCOUNTER — Ambulatory Visit: Payer: Medicare Other | Admitting: Internal Medicine

## 2021-01-16 LAB — PROTEIN ELECTROPHORESIS, SERUM, WITH REFLEX
Albumin ELP: 4.2 g/dL (ref 3.8–4.8)
Alpha 1: 0.3 g/dL (ref 0.2–0.3)
Alpha 2: 0.9 g/dL (ref 0.5–0.9)
Beta 2: 0.3 g/dL (ref 0.2–0.5)
Beta Globulin: 0.5 g/dL (ref 0.4–0.6)
Gamma Globulin: 1.1 g/dL (ref 0.8–1.7)
Total Protein: 7.2 g/dL (ref 6.1–8.1)

## 2021-01-16 LAB — COMPLETE METABOLIC PANEL WITH GFR
AG Ratio: 1.5 (calc) (ref 1.0–2.5)
ALT: 10 U/L (ref 6–29)
AST: 24 U/L (ref 10–35)
Albumin: 4.4 g/dL (ref 3.6–5.1)
Alkaline phosphatase (APISO): 58 U/L (ref 37–153)
BUN/Creatinine Ratio: 16 (calc) (ref 6–22)
BUN: 17 mg/dL (ref 7–25)
CO2: 29 mmol/L (ref 20–32)
Calcium: 10.2 mg/dL (ref 8.6–10.4)
Chloride: 102 mmol/L (ref 98–110)
Creat: 1.06 mg/dL — ABNORMAL HIGH (ref 0.60–0.95)
Globulin: 2.9 g/dL (calc) (ref 1.9–3.7)
Glucose, Bld: 99 mg/dL (ref 65–99)
Potassium: 4.6 mmol/L (ref 3.5–5.3)
Sodium: 140 mmol/L (ref 135–146)
Total Bilirubin: 0.5 mg/dL (ref 0.2–1.2)
Total Protein: 7.3 g/dL (ref 6.1–8.1)
eGFR: 53 mL/min/{1.73_m2} — ABNORMAL LOW (ref >=60)

## 2021-01-16 LAB — HEPATITIS C ANTIBODY
Hepatitis C Ab: NONREACTIVE
SIGNAL TO CUT-OFF: 0.01 (ref <1.00)

## 2021-01-16 LAB — CYCLIC CITRUL PEPTIDE ANTIBODY, IGG: Cyclic Citrullin Peptide Ab: <16 UNITS

## 2021-01-16 LAB — QUANTIFERON-TB GOLD PLUS
Mitogen-NIL: >10 IU/mL
NIL: 0.12 IU/mL
QuantiFERON-TB Gold Plus: NEGATIVE
TB1-NIL: <0 IU/mL
TB2-NIL: <0 IU/mL

## 2021-01-16 LAB — RHEUMATOID FACTOR: Rheumatoid fact SerPl-aCnc: 355 IU/mL — ABNORMAL HIGH (ref <14)

## 2021-01-16 LAB — URIC ACID: Uric Acid, Serum: 5.6 mg/dL (ref 2.5–7.0)

## 2021-01-16 LAB — IGG, IGA, IGM
IgG (Immunoglobin G), Serum: 1149 mg/dL (ref 600–1540)
IgM, Serum: 137 mg/dL (ref 50–300)
Immunoglobulin A: 212 mg/dL (ref 70–320)

## 2021-01-16 LAB — GLUCOSE 6 PHOSPHATE DEHYDROGENASE: G-6PDH: 16.4 U/g Hgb (ref 7.0–20.5)

## 2021-01-16 LAB — SEDIMENTATION RATE: Sed Rate: 11 mm/h (ref 0–30)

## 2021-01-16 LAB — HEPATITIS B CORE ANTIBODY, IGM: Hep B C IgM: NONREACTIVE

## 2021-01-16 LAB — TSH: TSH: 1.91 mIU/L (ref 0.40–4.50)

## 2021-01-16 LAB — CK: Total CK: 80 U/L (ref 29–143)

## 2021-01-16 LAB — HEPATITIS B SURFACE ANTIGEN: Hepatitis B Surface Ag: NONREACTIVE

## 2021-01-16 NOTE — Progress Notes (Signed)
Rheumatoid factor is positive, GFR is 12.  All other labs within normal limits.  I will discuss results at the follow-up visit.

## 2021-01-26 NOTE — Progress Notes (Signed)
Office Visit Note  Patient: [name]             Date of Birth: [birth year]           MRN: [id number]             PCP: Pablo Lawrence, NP Referring: Pablo Lawrence, NP Visit Date: 02/05/2021 Occupation: @GUAROCC @  Subjective:  Pain in multiple joints.   History of Present Illness: [name] is a 80 y.o. female with a history of seropositive rheumatoid arthritis and osteoarthritis.  She has been off Humira for several months as she was concerned about the side effects of the medication and also she felt that she had an adequate response.  She continues to have pain and discomfort in her bilateral hands.  She describes nocturnal pain in her hands.  Notices swelling in her hands.  She denies any discomfort in her hips and knees today.  Activities of Daily Living:  Patient reports morning stiffness for all day. Patient Reports nocturnal pain.  Difficulty dressing/grooming: Denies Difficulty climbing stairs: Denies Difficulty getting out of chair: Denies Difficulty using hands for taps, buttons, cutlery, and/or writing: Reports  Review of Systems  Constitutional:  Positive for fatigue.  HENT:  Negative for mouth sores, mouth dryness and nose dryness.   Eyes:  Negative for pain, itching and dryness.  Respiratory:  Negative for shortness of breath and difficulty breathing.   Cardiovascular:  Negative for chest pain and palpitations.  Gastrointestinal:  Negative for blood in stool, constipation and diarrhea.  Endocrine: Negative for increased urination.  Genitourinary:  Negative for difficulty urinating.  Musculoskeletal:  Positive for joint pain, joint pain, joint swelling and morning stiffness. Negative for myalgias, muscle tenderness and myalgias.  Skin:  Negative for color change, rash and redness.  Allergic/Immunologic: Negative for susceptible to infections.  Neurological:  Negative for dizziness, numbness, headaches, memory loss and weakness.  Hematological:   Positive for bruising/bleeding tendency.  Psychiatric/Behavioral:  Negative for confusion.    PMFS History:  Patient Active Problem List   Diagnosis Date Noted   Seropositive rheumatoid arthritis ([place]) 01/27/2021   Primary osteoarthritis of both hands 01/27/2021   Chondrocalcinosis 01/27/2021   Status post bilateral total hip replacement 01/27/2021   Primary osteoarthritis of both feet 01/27/2021   Abnormal stress test    Dyspnea on exertion    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/05/2017   Osteoarthritis of right hip 01/02/2017   Primary localized osteoarthritis of left hip 02/29/2016   Primary osteoarthritis of left hip 02/22/2016   Fitting and adjustment of pessary 05/29/2015   Rectal pain, chronic 05/18/2015   History of diverticulosis 05/04/2015   Pelvic pain in female 02/20/2015   Cystocele 02/20/2015   Prolapse of vaginal vault after hysterectomy 02/20/2015   Hematuria 02/20/2015   Abnormal findings on diagnostic imaging of liver and biliary tract 12/06/2014   Dysphagia, pharyngoesophageal phase    Mucosal abnormality of stomach    Diverticulosis of colon without hemorrhage    Rectal bleeding 07/25/2014   Left sided abdominal pain 03/01/2014   Heme positive stool 03/01/2014   Esophageal dysphagia 04/04/2013   Anemia 04/04/2013   Abnormal LFTs 04/04/2013   Change in bowel habits 04/04/2013   Constipation 04/04/2013   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 01/16/2013   Degeneration of intervertebral disc, site unspecified 12/15/2012   Shortness of breath 01/14/2012   Hypertensive heart disease 01/14/2012   Coronary atherosclerosis of native coronary artery 12/14/2009   ASTHMA 12/14/2009  GERD 12/14/2009   BREAST CANCER, HX OF 12/14/2009    Past Medical History:  Diagnosis Date   Anemia    Arthritis    Breast cancer ([place])    Right mastectomy   Chronic back pain    Coronary atherosclerosis of native coronary artery    Moderate multivessel disease - medical  therapy recommended September 2021   Environmental allergies    Essential hypertension    GERD (gastroesophageal reflux disease)    History of bronchitis    History of colon polyps    History of diverticulosis    History of migraine    Panic attacks    Prolapse of vaginal vault after hysterectomy    Seasonal allergies    Urinary frequency     Family History  Problem Relation Age of Onset   Coronary artery disease Mother    Coronary artery disease Father    Cancer Sister        lung cancer, two sisters   Lung cancer Sister    Coronary artery disease Brother    Colon cancer Brother        less than age 22   Cancer Brother    Emphysema Brother    Diabetes Son    Healthy Son    Healthy Son    Healthy Son    Healthy Son    Past Surgical History:  Procedure Laterality Date   ABDOMINAL HYSTERECTOMY     BACK SURGERY  1981/2011/2014   BLADDER SUSPENSION     BREAST RECONSTRUCTION  2004   with abd tissue   BREAST SURGERY Right    CHOLECYSTECTOMY     COLONOSCOPY  12/2009   RMR: few pancolonic diverticula. next TCS 12/2014   COLONOSCOPY  06/14/2007   DPO:EUMPNTIRWE rectal polyp, status post cold biopsy removal/ Anal canal hemorrhoids/Left-sided diverticula Colonic mucosa appeared normal.Hyperplastic polyp.    COLONOSCOPY  01/18/2004   RXV:QMGQQPY diverticula/Internal hemorrhoids.  Otherwise normal rectum   COLONOSCOPY N/A 04/18/2013   Dr. Gala Romney- normal rectum, scattered left sided diverticula the remainder of the colonic mucosa appeared normal   COLONOSCOPY N/A 08/18/2014   Procedure: COLONOSCOPY;  Surgeon: Daneil Dolin, MD;  [facility];  Service: Endoscopy;  Laterality: N/A;  1000   ESOPHAGOGASTRODUODENOSCOPY  06/14/2007   PPJ:[id number]-TIWP plaques in esophageal mucosa of uncertain significance brushed and biopsied/Normal stomach, normal first duodenum and second duodenoscopy. KOH negative. esophageal bx c/w GERD   ESOPHAGOGASTRODUODENOSCOPY N/A 08/18/2014    Procedure: ESOPHAGOGASTRODUODENOSCOPY (EGD);  Surgeon: Daneil Dolin, MD;  [facility];  Service: Endoscopy;  Laterality: N/A;   ESOPHAGOGASTRODUODENOSCOPY (EGD) WITH ESOPHAGEAL DILATION N/A 04/18/2013   Dr. Gala Romney- normal, patent appearing, tubular esophagus, normal gastric mucosa, paptent pylorus, normal first and second portion of the duodenum   EYE SURGERY     lasik   FOOT SURGERY Right 2014   d/t plantar fascitis   GIVENS CAPSULE STUDY N/A 07/18/2013   L.Lewis PAC- markedly abnormal appearing gastric mucosa, sugnificant bile reflux, questionable subucosal small bowel mass versus extrinsic compression in the distal small bowel.- CTE= no small bowel mass or tumor seen.   LEFT HEART CATH AND CORONARY ANGIOGRAPHY N/A 01/16/2020   Procedure: LEFT HEART CATH AND CORONARY ANGIOGRAPHY;  Surgeon: Burnell Blanks, MD;  Location: [facility];  Service: Cardiovascular;  Laterality: N/A;   MALONEY DILATION N/A 08/18/2014   Procedure: Venia Minks DILATION;  Surgeon: Daneil Dolin, MD;  [facility];  Service: Endoscopy;  Laterality:  N/A;   MASTECTOMY     right side 12-13 years ago   MOUTH SURGERY     cyst removed in June 2022.   TOTAL HIP ARTHROPLASTY Left 02/29/2016   TOTAL HIP ARTHROPLASTY Left 02/29/2016   Procedure: TOTAL HIP ARTHROPLASTY ANTERIOR APPROACH;  Surgeon: Frederik Pear, MD;  Location: [facility];  Service: Orthopedics;  Laterality: Left;   TOTAL HIP ARTHROPLASTY Right 01/05/2017   Procedure: TOTAL HIP ARTHROPLASTY ANTERIOR APPROACH;  Surgeon: Frederik Pear, MD;  Location: [facility];  Service: Orthopedics;  Laterality: Right;  REQUEST 41 MINS   Social History   Social History Narrative   Patient lives at home with spouse.   Caffeine Use: 2 cups of coffee daily   Immunization History  Administered Date(s) Administered   Influenza,inj,Quad PF,6+ Mos 01/16/2013   Moderna Sars-Covid-2 Vaccination 06/09/2019, 07/08/2019     Objective: Vital Signs: BP (!)  148/79 (BP Location: Left Arm, Patient Position: Sitting, Cuff Size: Normal)   Pulse 69   Ht 5' 5.5" (1.664 m)   Wt 161 lb 3.2 oz (73.1 kg)   BMI 26.42 kg/m    Physical Exam Vitals and nursing note reviewed.  Constitutional:      Appearance: She is well-developed.  HENT:     Head: Normocephalic and atraumatic.  Eyes:     Conjunctiva/sclera: Conjunctivae normal.  Cardiovascular:     Rate and Rhythm: Normal rate and regular rhythm.     Heart sounds: Normal heart sounds.  Pulmonary:     Effort: Pulmonary effort is normal.     Breath sounds: Normal breath sounds.  Abdominal:     General: Bowel sounds are normal.     Palpations: Abdomen is soft.  Musculoskeletal:     Cervical back: Normal range of motion.  Lymphadenopathy:     Cervical: No cervical adenopathy.  Skin:    General: Skin is warm and dry.     Capillary Refill: Capillary refill takes less than 2 seconds.  Neurological:     Mental Status: She is alert and oriented to person, place, and time.  Psychiatric:        Behavior: Behavior normal.     Musculoskeletal Exam: She had limited painful range of motion of her cervical spine.  Shoulder joints, elbow joints, wrist joints were in good range of motion with no synovitis.  She has thickening of the MCP joints but no active synovitis was noted.  PIP and DIP thickening was noted.  Hip joints and knee joints with good range of motion without any warmth swelling or effusion.  There was no tenderness over MTPs or PIPs.  CDAI Exam: CDAI Score: 0.8  Patient Global: 5 mm; Provider Global: 3 mm Swollen: 0 ; Tender: 0  Joint Exam 02/05/2021   No joint exam has been documented for this visit   There is currently no information documented on the homunculus. Go to the Rheumatology activity and complete the homunculus joint exam.  Investigation: No additional findings.  Imaging: XR Foot 2 Views Left  Result Date: 01/11/2021 First MTP, PIP and DIP narrowing was noted.  No  intertarsal, tibiotalar or subtalar joint space narrowing was noted.  Inferior calcaneal spur was noted.  No erosive changes were noted. Impression: These findings are consistent with osteoarthritis of the foot.  XR Foot 2 Views Right  Result Date: 01/11/2021 First MTP, PIP and DIP narrowing was noted.  No intertarsal, tibiotalar or subtalar joint space narrowing was noted.  Inferior calcaneal spur was noted.  No  erosive changes were noted. Impression: These findings are consistent with osteoarthritis of the foot.  XR Hand 2 View Left  Result Date: 01/11/2021 Severe CMC, PIP and DIP narrowing was noted.  Subluxation of second DIP joint was noted.  No MCP, intercarpal or radiocarpal joint space narrowing was noted. Impression: These findings are consistent with osteoarthritis of the hand.  XR Hand 2 View Right  Result Date: 01/11/2021 CMC, PIP and DIP narrowing was noted.  No MCP, intercarpal or radiocarpal joint space narrowing was noted.  Chondrocalcinosis was noted in the right third MCP joint and wrist joint. Impression: These findings are consistent with osteoarthritis and chondrocalcinosis.   Recent Labs: Lab Results  Component Value Date   WBC 10.2 11/29/2020   HGB 11.4 11/29/2020   PLT 330 11/29/2020   NA 140 01/11/2021   K 4.6 01/11/2021   CL 102 01/11/2021   CO2 29 01/11/2021   GLUCOSE 99 01/11/2021   BUN 17 01/11/2021   CREATININE 1.06 (H) 01/11/2021   BILITOT 0.5 01/11/2021   ALKPHOS 79 08/25/2014   AST 24 01/11/2021   ALT 10 01/11/2021   PROT 7.3 01/11/2021   PROT 7.2 01/11/2021   ALBUMIN 4.0 08/25/2014   CALCIUM 10.2 01/11/2021   GFRAA 50 (L) 01/13/2020   QFTBGOLDPLUS NEGATIVE 01/11/2021   January 11, 2021 SPEP normal, immunoglobulins normal, TB Gold negative, hepatitis B-, hepatitis C negative, G6PD normal, RF 355, anti-CCP negative, uric acid 5.6, ESR 11, TSH normal, CK80  Speciality Comments: ttd at [facility] rheumMethotrexate- nausea, Arava- GI side effects,  Enbrel-infections and allergic reaction, Humira-GI side effects and inadequate response  Procedures:  No procedures performed Allergies: Penicillins and Apixaban   Assessment / Plan:     Visit Diagnoses: Seropositive rheumatoid arthritis ([place]) - Positive RF, positive anti-CCP.  She has been off treatment for the last 3 months.  Last treatment was Humira which was several months ago.  She continues to have pain and discomfort in her joints.  I do not see any synovitis.  She does have synovial thickening over bilateral MCP joints.  PIP and DIP thickening was also noted.  I detailed discussion regarding side effects of Orencia and hydroxychloroquine.  High risk medication use-we had discussion regarding subcutaneous Orencia at the last visit.  I also discussed hydroxychloroquine today.  Patient states she is a still having infections in her teeth and has finished her antibiotic recently.  She will be getting dental implants.  I decided to hold off all the medications for right now until she finishes her dental implants.  Maybe will consider hydroxychloroquine at the follow-up visit.  She is very sensitive to medications and is concerned about side effects from most medications.  Dental infection - She is on antibiotics for dental infection and will need dental implants.  She wanted to hold off immunosuppressive therapy  Primary osteoarthritis of both hands - Clinical and radiographic findings are consistent with osteoarthritis.  No erosive changes were noted on the x-rays.  I believe most of the discomfort is coming from underlying osteoarthritis.  Chondrocalcinosis - Noted on the x-ray of the hands.  No warmth swelling or effusion was noted.  Status post bilateral total hip replacement - 2017 and 2018 by Dr. Mayer Camel.  She had limited range of motion of bilateral hip joints.  Primary osteoarthritis of both feet - Patient gives history of intermittent pain and swelling.  Clinical and radiographic  findings were consistent with osteoarthritis.  No synovitis was noted today.  Pes cavus-proper  fitting shoes were discussed.  DDD (degenerative disc disease), lumbar - Status post fusion by Dr. Lynann Bologna.  She still has chronic pain and discomfort.  History of coronary atherosclerosis  Gastroesophageal reflux disease without esophagitis  Dysphagia, pharyngoesophageal phase - Status post esophageal stretching  Abnormal findings on diagnostic imaging of liver and biliary tract  Diverticulosis of colon without hemorrhage - Diagnosed about 40 years ago per patient.  History of asthma  Prolapse of vaginal vault after hysterectomy  Orders: No orders of the defined types were placed in this encounter.  No orders of the defined types were placed in this encounter.    Follow-Up Instructions: Return in about 5 months (around 07/06/2021) for Rheumatoid arthritis, Osteoarthritis.   Bo Merino, MD  Note - This record has been created using Editor, commissioning.  Chart creation errors have been sought, but may not always  have been located. Such creation errors do not reflect on  the standard of medical care.

## 2021-01-27 DIAGNOSIS — M19072 Primary osteoarthritis, left ankle and foot: Secondary | ICD-10-CM | POA: Insufficient documentation

## 2021-01-27 DIAGNOSIS — M19071 Primary osteoarthritis, right ankle and foot: Secondary | ICD-10-CM | POA: Insufficient documentation

## 2021-01-27 DIAGNOSIS — M19042 Primary osteoarthritis, left hand: Secondary | ICD-10-CM | POA: Insufficient documentation

## 2021-01-27 DIAGNOSIS — M19041 Primary osteoarthritis, right hand: Secondary | ICD-10-CM | POA: Insufficient documentation

## 2021-01-27 DIAGNOSIS — M059 Rheumatoid arthritis with rheumatoid factor, unspecified: Secondary | ICD-10-CM | POA: Insufficient documentation

## 2021-01-27 DIAGNOSIS — M1120 Other chondrocalcinosis, unspecified site: Secondary | ICD-10-CM | POA: Insufficient documentation

## 2021-01-27 DIAGNOSIS — Z96643 Presence of artificial hip joint, bilateral: Secondary | ICD-10-CM | POA: Insufficient documentation

## 2021-02-04 ENCOUNTER — Other Ambulatory Visit: Payer: Self-pay | Admitting: Cardiology

## 2021-02-05 ENCOUNTER — Other Ambulatory Visit: Payer: Self-pay

## 2021-02-05 ENCOUNTER — Encounter: Payer: Self-pay | Admitting: Rheumatology

## 2021-02-05 ENCOUNTER — Ambulatory Visit: Payer: Medicare Other | Admitting: Rheumatology

## 2021-02-05 VITALS — BP 148/79 | HR 69 | Ht 65.5 in | Wt 161.2 lb

## 2021-02-05 DIAGNOSIS — K219 Gastro-esophageal reflux disease without esophagitis: Secondary | ICD-10-CM

## 2021-02-05 DIAGNOSIS — Z96643 Presence of artificial hip joint, bilateral: Secondary | ICD-10-CM

## 2021-02-05 DIAGNOSIS — Z8709 Personal history of other diseases of the respiratory system: Secondary | ICD-10-CM

## 2021-02-05 DIAGNOSIS — R932 Abnormal findings on diagnostic imaging of liver and biliary tract: Secondary | ICD-10-CM

## 2021-02-05 DIAGNOSIS — Z8679 Personal history of other diseases of the circulatory system: Secondary | ICD-10-CM

## 2021-02-05 DIAGNOSIS — R1314 Dysphagia, pharyngoesophageal phase: Secondary | ICD-10-CM

## 2021-02-05 DIAGNOSIS — M1120 Other chondrocalcinosis, unspecified site: Secondary | ICD-10-CM

## 2021-02-05 DIAGNOSIS — M19071 Primary osteoarthritis, right ankle and foot: Secondary | ICD-10-CM

## 2021-02-05 DIAGNOSIS — M19072 Primary osteoarthritis, left ankle and foot: Secondary | ICD-10-CM

## 2021-02-05 DIAGNOSIS — K047 Periapical abscess without sinus: Secondary | ICD-10-CM

## 2021-02-05 DIAGNOSIS — M059 Rheumatoid arthritis with rheumatoid factor, unspecified: Secondary | ICD-10-CM

## 2021-02-05 DIAGNOSIS — N993 Prolapse of vaginal vault after hysterectomy: Secondary | ICD-10-CM

## 2021-02-05 DIAGNOSIS — M19042 Primary osteoarthritis, left hand: Secondary | ICD-10-CM

## 2021-02-05 DIAGNOSIS — Z79899 Other long term (current) drug therapy: Secondary | ICD-10-CM | POA: Diagnosis not present

## 2021-02-05 DIAGNOSIS — Q667 Congenital pes cavus, unspecified foot: Secondary | ICD-10-CM

## 2021-02-05 DIAGNOSIS — K5730 Diverticulosis of large intestine without perforation or abscess without bleeding: Secondary | ICD-10-CM

## 2021-02-05 DIAGNOSIS — M19041 Primary osteoarthritis, right hand: Secondary | ICD-10-CM

## 2021-02-05 DIAGNOSIS — M5136 Other intervertebral disc degeneration, lumbar region: Secondary | ICD-10-CM

## 2021-02-08 ENCOUNTER — Ambulatory Visit: Payer: Medicare Other | Admitting: Rheumatology

## 2021-02-26 DIAGNOSIS — I4819 Other persistent atrial fibrillation: Secondary | ICD-10-CM

## 2021-02-26 HISTORY — DX: Other persistent atrial fibrillation: I48.19

## 2021-03-08 DIAGNOSIS — G894 Chronic pain syndrome: Secondary | ICD-10-CM | POA: Insufficient documentation

## 2021-03-08 DIAGNOSIS — J869 Pyothorax without fistula: Secondary | ICD-10-CM | POA: Insufficient documentation

## 2021-03-12 ENCOUNTER — Other Ambulatory Visit: Payer: Self-pay | Admitting: Orthopedic Surgery

## 2021-03-26 ENCOUNTER — Ambulatory Visit: Payer: Medicare Other | Admitting: Gastroenterology

## 2021-03-27 ENCOUNTER — Inpatient Hospital Stay (HOSPITAL_COMMUNITY)
Admission: EM | Admit: 2021-03-27 | Discharge: 2021-03-28 | DRG: 309 | Disposition: A | Discharge status: Home / Self Care | Payer: Medicare Other | Attending: Internal Medicine | Admitting: Internal Medicine

## 2021-03-27 ENCOUNTER — Telehealth: Payer: Self-pay | Admitting: Cardiology

## 2021-03-27 ENCOUNTER — Other Ambulatory Visit: Payer: Self-pay

## 2021-03-27 ENCOUNTER — Emergency Department (HOSPITAL_COMMUNITY): Payer: Medicare Other

## 2021-03-27 ENCOUNTER — Encounter (HOSPITAL_COMMUNITY): Payer: Self-pay | Admitting: *Deleted

## 2021-03-27 DIAGNOSIS — G8929 Other chronic pain: Secondary | ICD-10-CM | POA: Diagnosis present

## 2021-03-27 DIAGNOSIS — I25119 Atherosclerotic heart disease of native coronary artery with unspecified angina pectoris: Secondary | ICD-10-CM

## 2021-03-27 DIAGNOSIS — Z79899 Other long term (current) drug therapy: Secondary | ICD-10-CM

## 2021-03-27 DIAGNOSIS — J45909 Unspecified asthma, uncomplicated: Secondary | ICD-10-CM | POA: Diagnosis present

## 2021-03-27 DIAGNOSIS — M549 Dorsalgia, unspecified: Secondary | ICD-10-CM | POA: Diagnosis present

## 2021-03-27 DIAGNOSIS — Z880 Allergy status to penicillin: Secondary | ICD-10-CM | POA: Diagnosis not present

## 2021-03-27 DIAGNOSIS — I4819 Other persistent atrial fibrillation: Principal | ICD-10-CM | POA: Diagnosis present

## 2021-03-27 DIAGNOSIS — E785 Hyperlipidemia, unspecified: Secondary | ICD-10-CM | POA: Diagnosis present

## 2021-03-27 DIAGNOSIS — R778 Other specified abnormalities of plasma proteins: Secondary | ICD-10-CM

## 2021-03-27 DIAGNOSIS — I248 Other forms of acute ischemic heart disease: Secondary | ICD-10-CM | POA: Diagnosis present

## 2021-03-27 DIAGNOSIS — Z20822 Contact with and (suspected) exposure to covid-19: Secondary | ICD-10-CM | POA: Diagnosis present

## 2021-03-27 DIAGNOSIS — Z888 Allergy status to other drugs, medicaments and biological substances status: Secondary | ICD-10-CM | POA: Diagnosis not present

## 2021-03-27 DIAGNOSIS — M1990 Unspecified osteoarthritis, unspecified site: Secondary | ICD-10-CM | POA: Diagnosis present

## 2021-03-27 DIAGNOSIS — Z853 Personal history of malignant neoplasm of breast: Secondary | ICD-10-CM

## 2021-03-27 DIAGNOSIS — Z9011 Acquired absence of right breast and nipple: Secondary | ICD-10-CM | POA: Diagnosis not present

## 2021-03-27 DIAGNOSIS — Z90710 Acquired absence of both cervix and uterus: Secondary | ICD-10-CM

## 2021-03-27 DIAGNOSIS — I10 Essential (primary) hypertension: Secondary | ICD-10-CM | POA: Diagnosis present

## 2021-03-27 DIAGNOSIS — M059 Rheumatoid arthritis with rheumatoid factor, unspecified: Secondary | ICD-10-CM | POA: Diagnosis present

## 2021-03-27 DIAGNOSIS — I2510 Atherosclerotic heart disease of native coronary artery without angina pectoris: Secondary | ICD-10-CM | POA: Diagnosis present

## 2021-03-27 DIAGNOSIS — Z825 Family history of asthma and other chronic lower respiratory diseases: Secondary | ICD-10-CM | POA: Diagnosis not present

## 2021-03-27 DIAGNOSIS — Z87892 Personal history of anaphylaxis: Secondary | ICD-10-CM

## 2021-03-27 DIAGNOSIS — K5730 Diverticulosis of large intestine without perforation or abscess without bleeding: Secondary | ICD-10-CM | POA: Diagnosis present

## 2021-03-27 DIAGNOSIS — Z8249 Family history of ischemic heart disease and other diseases of the circulatory system: Secondary | ICD-10-CM | POA: Diagnosis not present

## 2021-03-27 DIAGNOSIS — I4891 Unspecified atrial fibrillation: Secondary | ICD-10-CM | POA: Diagnosis present

## 2021-03-27 DIAGNOSIS — R079 Chest pain, unspecified: Secondary | ICD-10-CM

## 2021-03-27 DIAGNOSIS — K219 Gastro-esophageal reflux disease without esophagitis: Secondary | ICD-10-CM | POA: Diagnosis present

## 2021-03-27 DIAGNOSIS — Z87891 Personal history of nicotine dependence: Secondary | ICD-10-CM | POA: Diagnosis not present

## 2021-03-27 DIAGNOSIS — M2550 Pain in unspecified joint: Secondary | ICD-10-CM | POA: Insufficient documentation

## 2021-03-27 DIAGNOSIS — Z7982 Long term (current) use of aspirin: Secondary | ICD-10-CM

## 2021-03-27 DIAGNOSIS — R0789 Other chest pain: Secondary | ICD-10-CM | POA: Diagnosis not present

## 2021-03-27 DIAGNOSIS — M069 Rheumatoid arthritis, unspecified: Secondary | ICD-10-CM | POA: Insufficient documentation

## 2021-03-27 DIAGNOSIS — I2489 Other forms of acute ischemic heart disease: Secondary | ICD-10-CM

## 2021-03-27 LAB — BASIC METABOLIC PANEL
Anion gap: 8 (ref 5–15)
BUN: 28 mg/dL — ABNORMAL HIGH (ref 8–23)
CO2: 24 mmol/L (ref 22–32)
Calcium: 9.6 mg/dL (ref 8.9–10.3)
Chloride: 104 mmol/L (ref 98–111)
Creatinine, Ser: 0.98 mg/dL (ref 0.44–1.00)
GFR, Estimated: 58 mL/min — ABNORMAL LOW (ref >60)
Glucose, Bld: 184 mg/dL — ABNORMAL HIGH (ref 70–99)
Potassium: 3.9 mmol/L (ref 3.5–5.1)
Sodium: 136 mmol/L (ref 135–145)

## 2021-03-27 LAB — CBC
HCT: 33.5 % — ABNORMAL LOW (ref 36.0–46.0)
Hemoglobin: 11.2 g/dL — ABNORMAL LOW (ref 12.0–15.0)
MCH: 32.3 pg (ref 26.0–34.0)
MCHC: 33.4 g/dL (ref 30.0–36.0)
MCV: 96.5 fL (ref 80.0–100.0)
Platelets: 291 10*3/uL (ref 150–400)
RBC: 3.47 MIL/uL — ABNORMAL LOW (ref 3.87–5.11)
RDW: 13.3 % (ref 11.5–15.5)
WBC: 10.1 10*3/uL (ref 4.0–10.5)
nRBC: 0 % (ref 0.0–0.2)

## 2021-03-27 LAB — TROPONIN I (HIGH SENSITIVITY)
Troponin I (High Sensitivity): 361 ng/L (ref <18)
Troponin I (High Sensitivity): 303 ng/L (ref <18)
Troponin I (High Sensitivity): 178 ng/L (ref <18)

## 2021-03-27 LAB — MAGNESIUM: Magnesium: 2 mg/dL (ref 1.7–2.4)

## 2021-03-27 LAB — RESP PANEL BY RT-PCR (FLU A&B, COVID) ARPGX2
Influenza A by PCR: NEGATIVE
Influenza B by PCR: NEGATIVE
SARS Coronavirus 2 by RT PCR: NEGATIVE

## 2021-03-27 LAB — HEPARIN LEVEL (UNFRACTIONATED): Heparin Unfractionated: 0.2 IU/mL — ABNORMAL LOW (ref 0.30–0.70)

## 2021-03-27 LAB — MRSA NEXT GEN BY PCR, NASAL: MRSA by PCR Next Gen: NOT DETECTED

## 2021-03-27 MED ORDER — DILTIAZEM LOAD VIA INFUSION
10.0000 mg | Freq: Once | INTRAVENOUS | Status: AC
  Administered 2021-03-27: 10 mg via INTRAVENOUS
  Filled 2021-03-27: qty 10

## 2021-03-27 MED ORDER — DILTIAZEM HCL-DEXTROSE 125-5 MG/125ML-% IV SOLN (PREMIX)
5.0000 mg/h | INTRAVENOUS | Status: DC
  Administered 2021-03-27: 15 mg/h via INTRAVENOUS
  Administered 2021-03-27: 5 mg/h via INTRAVENOUS
  Filled 2021-03-27 (×2): qty 125

## 2021-03-27 MED ORDER — HYDROCODONE-ACETAMINOPHEN 5-325 MG PO TABS
1.0000 | ORAL_TABLET | Freq: Three times a day (TID) | ORAL | Status: DC | PRN
  Administered 2021-03-27: 1 via ORAL
  Filled 2021-03-27: qty 1

## 2021-03-27 MED ORDER — METOPROLOL SUCCINATE ER 25 MG PO TB24
25.0000 mg | ORAL_TABLET | Freq: Every day | ORAL | Status: DC
  Administered 2021-03-27: 25 mg via ORAL
  Filled 2021-03-27: qty 1

## 2021-03-27 MED ORDER — ACETAMINOPHEN 325 MG PO TABS
650.0000 mg | ORAL_TABLET | Freq: Four times a day (QID) | ORAL | Status: DC | PRN
  Administered 2021-03-27: 650 mg via ORAL
  Filled 2021-03-27: qty 2

## 2021-03-27 MED ORDER — HEPARIN BOLUS VIA INFUSION
4000.0000 [IU] | Freq: Once | INTRAVENOUS | Status: AC
Start: 2021-03-27 — End: 2021-03-27
  Administered 2021-03-27: 4000 [IU] via INTRAVENOUS

## 2021-03-27 MED ORDER — ASPIRIN EC 81 MG PO TBEC
81.0000 mg | DELAYED_RELEASE_TABLET | ORAL | Status: DC
  Administered 2021-03-28: 81 mg via ORAL
  Filled 2021-03-27: qty 1

## 2021-03-27 MED ORDER — MELATONIN 3 MG PO TABS
6.0000 mg | ORAL_TABLET | Freq: Once | ORAL | Status: AC
  Administered 2021-03-27: 6 mg via ORAL
  Filled 2021-03-27: qty 2

## 2021-03-27 MED ORDER — ONDANSETRON HCL 4 MG PO TABS: 4.0000 mg | ORAL_TABLET | Freq: Four times a day (QID) | ORAL | Status: DC | PRN

## 2021-03-27 MED ORDER — METOPROLOL SUCCINATE ER 25 MG PO TB24: 25.0000 mg | ORAL_TABLET | Freq: Every day | ORAL | Status: DC

## 2021-03-27 MED ORDER — ROSUVASTATIN CALCIUM 5 MG PO TABS
5.0000 mg | ORAL_TABLET | Freq: Every day | ORAL | Status: DC
  Administered 2021-03-28: 5 mg via ORAL
  Filled 2021-03-27: qty 1

## 2021-03-27 MED ORDER — POLYETHYLENE GLYCOL 3350 17 G PO PACK: 17.0000 g | PACK | Freq: Every day | ORAL | Status: DC | PRN

## 2021-03-27 MED ORDER — ONDANSETRON HCL 4 MG/2ML IJ SOLN: 4.0000 mg | Freq: Four times a day (QID) | INTRAMUSCULAR | Status: DC | PRN

## 2021-03-27 MED ORDER — HEPARIN (PORCINE) 25000 UT/250ML-% IV SOLN
1050.0000 [IU]/h | INTRAVENOUS | Status: DC
  Administered 2021-03-27: 950 [IU]/h via INTRAVENOUS
  Administered 2021-03-28: 1050 [IU]/h via INTRAVENOUS
  Filled 2021-03-27 (×2): qty 250

## 2021-03-27 MED ORDER — ACETAMINOPHEN 650 MG RE SUPP: 650.0000 mg | Freq: Four times a day (QID) | RECTAL | Status: DC | PRN

## 2021-03-27 MED ORDER — ALBUTEROL SULFATE (2.5 MG/3ML) 0.083% IN NEBU: 3.0000 mL | INHALATION_SOLUTION | Freq: Four times a day (QID) | RESPIRATORY_TRACT | Status: DC | PRN

## 2021-03-27 NOTE — Progress Notes (Signed)
Called ED to obtain report on patient to be admitted to ICU 09, transferred x 2 times, spoke with Nira Conn who reported she was in a patient's room at this time & will call this RN back for report on patient

## 2021-03-27 NOTE — Progress Notes (Signed)
ANTICOAGULATION CONSULT NOTE - Initial Consult  Pharmacy Consult for Heparin Indication: atrial fibrillation  Allergies  Allergen Reactions   Penicillins Anaphylaxis, Swelling and Other (See Comments)    Blisters Has patient had a PCN reaction causing immediate rash, facial/tongue/throat swelling, SOB or lightheadedness with hypotension: Yes Has patient had a PCN reaction causing severe rash involving mucus membranes or skin necrosis: No Has patient had a PCN reaction that required hospitalization No Has patient had a PCN reaction occurring within the last 10 years: No If all of the above answers are "NO", then may proceed with Cephalosporin use.    Apixaban Hives    Patient Measurements: Height: 5' 5.5" (166.4 cm) Weight: 68 kg (150 lb) IBW/kg (Calculated) : 58.15 HEPARIN DW (KG): 68   Vital Signs: Temp: 97.4 F (36.3 C) (11/30 1229) Temp Source: Oral (11/30 1229) BP: 165/77 (11/30 1330) Pulse Rate: 87 (11/30 1337)  Labs: Recent Labs    03/27/21 1302  HGB 11.2*  HCT 33.5*  PLT 291  CREATININE 0.98  TROPONINIHS 361*    Estimated Creatinine Clearance: 42.1 mL/min (by C-G formula based on SCr of 0.98 mg/dL).   Medical History: Past Medical History:  Diagnosis Date   Anemia    Arthritis    Breast cancer ([place])    Right mastectomy   Chronic back pain    Coronary atherosclerosis of native coronary artery    Moderate multivessel disease - medical therapy recommended September 2021   Environmental allergies    Essential hypertension    GERD (gastroesophageal reflux disease)    History of bronchitis    History of colon polyps    History of diverticulosis    History of migraine    Panic attacks    Prolapse of vaginal vault after hysterectomy    Seasonal allergies    Urinary frequency     Medications:  See med rec  Assessment: Patient with new onset afib. Not on oral anticoagulants prior to admission. Pharmacy asked to start heparin  Goal of Therapy:   Heparin level 0.3-0.7 units/ml Monitor platelets by anticoagulation protocol: Yes   Plan:  Give 4000 units bolus x 1 Start heparin infusion at 950 units/hr Check anti-Xa level in ~8 hours and daily while on heparin Continue to monitor H&H and platelets  Isac Sarna, BS Vena [place], BCPS Clinical Pharmacist Pager [phone] 03/27/2021,2:05 PM

## 2021-03-27 NOTE — Progress Notes (Signed)
Report on patient obtained from ED RN Nira Conn, reported will bring patient to ICU after patient is swabbed for flu/COVID & would let this RN know when resulted

## 2021-03-27 NOTE — ED Provider Notes (Signed)
[facility] EMERGENCY DEPARTMENT Provider Note   CSN: [id number] Arrival date & time: 03/27/21  1222     History Chief Complaint  Patient presents with   Shortness of Breath    [name] is a 80 y.o. female.  HPI Patient presents with dyspnea, fatigue, palpitations and chest pain that was present yesterday.  She has no history of A. fib, had catheterization last year, echo the year prior.  Cath report below, echo with preserved ejection fraction on review. She now presents with weeks of worsening exercise capacity, and new dyspnea with exertion.  She had pain in the sternum and epigastrium yesterday, but this resolved without clear intervention.  No recent medication change, diet change, activity change.  Symptoms did improve with rest.     Past Medical History:  Diagnosis Date   Anemia    Arthritis    Breast cancer ([place])    Right mastectomy   Chronic back pain    Coronary atherosclerosis of native coronary artery    Moderate multivessel disease - medical therapy recommended September 2021   Environmental allergies    Essential hypertension    GERD (gastroesophageal reflux disease)    History of bronchitis    History of colon polyps    History of diverticulosis    History of migraine    Panic attacks    Prolapse of vaginal vault after hysterectomy    Seasonal allergies    Urinary frequency     Patient Active Problem List   Diagnosis Date Noted   Polyarthralgia 03/27/2021   Rheumatoid arthritis ([place]) 03/27/2021   Atrial fibrillation with rapid ventricular response ([place]) 03/27/2021   Chronic pain syndrome 03/08/2021   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   Primary osteoarthritis of both hands 01/27/2021   Chondrocalcinosis 01/27/2021   Status post bilateral total hip replacement 01/27/2021   Primary osteoarthritis of both feet 01/27/2021   Mixed hyperlipidemia 09/05/2020   Irregular heart beat 07/25/2020    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   Gastroesophageal reflux disease without esophagitis 07/25/2020   Mucosal abnormality of stomach 07/24/2020   Body mass index (BMI) 26.0-26.9, adult 03/12/2020   Essential hypertension 03/12/2020   Cervical myelopathy ([place]) 03/12/2020   Cervical spondylosis 03/12/2020   Abnormal stress test    Dyspnea on exertion    Pain in left knee 10/27/2017   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/05/2017   Osteoarthritis of right hip 01/02/2017   Primary localized osteoarthritis of left hip 02/29/2016   Primary osteoarthritis of left hip 02/22/2016   Unilateral primary osteoarthritis, left hip 02/22/2016   Fitting and adjustment of pessary 05/29/2015   Rectal pain, chronic 05/18/2015   History of diverticulosis 05/04/2015   Pelvic pain in female 02/20/2015   Cystocele 02/20/2015   Prolapse of vaginal vault after hysterectomy 02/20/2015   Hematuria 02/20/2015   Abnormal findings on diagnostic imaging of liver and biliary tract 12/06/2014   Dysphagia, pharyngoesophageal phase    Diverticulosis of colon without hemorrhage    Rectal bleeding 07/25/2014   Left sided abdominal pain 03/01/2014   Heme positive stool 03/01/2014   Esophageal dysphagia 04/04/2013   Anemia 04/04/2013   Abnormal LFTs 04/04/2013   Change in bowel habits 04/04/2013   Constipation 04/04/2013   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 01/16/2013   Degeneration of intervertebral disc, site unspecified 12/15/2012   Shortness of breath 01/14/2012   Hypertensive heart disease 01/14/2012   Coronary atherosclerosis of native coronary artery 12/14/2009  ASTHMA 12/14/2009   GERD 12/14/2009   BREAST CANCER, HX OF 12/14/2009   Asthma 12/14/2009   CAD (coronary artery disease), native coronary artery 12/14/2009    Past Surgical History:  Procedure Laterality Date   ABDOMINAL HYSTERECTOMY     BACK SURGERY  1981/2011/2014   BLADDER SUSPENSION     BREAST RECONSTRUCTION  2004   with abd tissue    BREAST SURGERY Right    CHOLECYSTECTOMY     COLONOSCOPY  12/2009   RMR: few pancolonic diverticula. next TCS 12/2014   COLONOSCOPY  06/14/2007   IFO:YDXAJOINOM rectal polyp, status post cold biopsy removal/ Anal canal hemorrhoids/Left-sided diverticula Colonic mucosa appeared normal.Hyperplastic polyp.    COLONOSCOPY  01/18/2004   VEH:MCNOBSJ diverticula/Internal hemorrhoids.  Otherwise normal rectum   COLONOSCOPY N/A 04/18/2013   Dr. Gala Romney- normal rectum, scattered left sided diverticula the remainder of the colonic mucosa appeared normal   COLONOSCOPY N/A 08/18/2014   Procedure: COLONOSCOPY;  Surgeon: Daneil Dolin, MD;  [facility];  Service: Endoscopy;  Laterality: N/A;  1000   ESOPHAGOGASTRODUODENOSCOPY  06/14/2007   GGE:ZMOQHU-TMLY plaques in esophageal mucosa of uncertain significance brushed and biopsied/Normal stomach, normal first duodenum and second duodenoscopy. KOH negative. esophageal bx c/w GERD   ESOPHAGOGASTRODUODENOSCOPY N/A 08/18/2014   Procedure: ESOPHAGOGASTRODUODENOSCOPY (EGD);  Surgeon: Daneil Dolin, MD;  [facility];  Service: Endoscopy;  Laterality: N/A;   ESOPHAGOGASTRODUODENOSCOPY (EGD) WITH ESOPHAGEAL DILATION N/A 04/18/2013   Dr. Gala Romney- normal, patent appearing, tubular esophagus, normal gastric mucosa, paptent pylorus, normal first and second portion of the duodenum   EYE SURGERY     lasik   FOOT SURGERY Right 2014   d/t plantar fascitis   GIVENS CAPSULE STUDY N/A 07/18/2013   L.Lewis PAC- markedly abnormal appearing gastric mucosa, sugnificant bile reflux, questionable subucosal small bowel mass versus extrinsic compression in the distal small bowel.- CTE= no small bowel mass or tumor seen.   LEFT HEART CATH AND CORONARY ANGIOGRAPHY N/A 01/16/2020   Procedure: LEFT HEART CATH AND CORONARY ANGIOGRAPHY;  Surgeon: Burnell Blanks, MD;  Location: [place] [facility];  Service: Cardiovascular;  Laterality: N/A;   MALONEY DILATION  N/A 08/18/2014   Procedure: Venia Minks DILATION;  Surgeon: Daneil Dolin, MD;  [facility];  Service: Endoscopy;  Laterality: N/A;   MASTECTOMY     right side 12-13 years ago   MOUTH SURGERY     cyst removed in June 2022.   TOTAL HIP ARTHROPLASTY Left 02/29/2016   TOTAL HIP ARTHROPLASTY Left 02/29/2016   Procedure: TOTAL HIP ARTHROPLASTY ANTERIOR APPROACH;  Surgeon: Frederik Pear, MD;  Location: [place];  Service: Orthopedics;  Laterality: Left;   TOTAL HIP ARTHROPLASTY Right 01/05/2017   Procedure: TOTAL HIP ARTHROPLASTY ANTERIOR APPROACH;  Surgeon: Frederik Pear, MD;  Location: [place];  Service: Orthopedics;  Laterality: Right;  REQUEST 39 MINS     OB History     Gravida  3   Para  3   Term  3   Preterm      AB      Living  4      SAB      IAB      Ectopic      Multiple  1   Live Births  4           Family History  Problem Relation Age of Onset   Coronary artery disease Mother    Coronary artery disease Father    Cancer  Sister        lung cancer, two sisters   Lung cancer Sister    Coronary artery disease Brother    Colon cancer Brother        less than age 70   Cancer Brother    Emphysema Brother    Diabetes Son    Healthy Son    Healthy Son    Healthy Son    Healthy Son     Social History   Tobacco Use   Smoking status: Former    Packs/day: 1.50    Years: 30.00    Pack years: 45.00    Types: Cigarettes    Quit date: 04/28/2000    Years since quitting: 20.9   Smokeless tobacco: Never  Vaping Use   Vaping Use: Never used  Substance Use Topics   Alcohol use: No   Drug use: No    Home Medications Prior to Admission medications   Medication Sig Start Date End Date Taking? Authorizing Provider  albuterol (VENTOLIN HFA) 108 (90 Base) MCG/ACT inhaler Inhale 2 puffs into the lungs every 6 (six) hours as needed for shortness of breath.   Yes [provider]  amLODipine (NORVASC) 5 MG tablet Take 5 mg by mouth at bedtime.   02/03/14  Yes [provider]  aspirin EC 81 MG tablet Take 1 tablet (81 mg total) by mouth every other day. Swallow whole. 02/22/20  Yes Satira Sark, MD  diphenhydrAMINE (BENADRYL) 25 MG tablet Take 25 mg by mouth daily as needed for allergies.   Yes [provider]  Docusate Calcium (STOOL SOFTENER PO) Take 1 tablet by mouth daily as needed (constipation).   Yes [provider]  HYDROcodone-acetaminophen (NORCO/VICODIN) 5-325 MG tablet Take 1 tablet by mouth every 8 (eight) hours as needed. 03/16/21  Yes [provider]  metoprolol succinate (TOPROL-XL) 25 MG 24 hr tablet Take 25 mg by mouth daily. 07/25/20  Yes [provider]  omeprazole (PRILOSEC) 20 MG capsule Take by mouth. 07/25/20  Yes [provider]  rosuvastatin (CRESTOR) 5 MG tablet TAKE ONE TABLET BY MOUTH DAILY 02/04/21  Yes Satira Sark, MD  diphenhydrAMINE HCl (ZZZQUIL) 50 MG/30ML LIQD Take 50 mg by mouth at bedtime as needed (sleep). Patient not taking: Reported on 03/27/2021    [provider]  HYDROcodone-acetaminophen (NORCO) 10-325 MG tablet Take 1 tablet by mouth 2 (two) times daily as needed for moderate pain.  Patient not taking: Reported on 03/27/2021 08/04/19   [provider]  predniSONE (DELTASONE) 20 MG tablet Take 2 tablets (40 mg total) by mouth daily. Patient not taking: Reported on 11/07/2020 10/30/20   Noemi Chapel, MD  sertraline (ZOLOFT) 25 MG tablet Take 12.5 mg by mouth daily.  Patient not taking: Reported on 11/07/2020    [provider]    Allergies    Penicillins and Apixaban  Review of Systems   Review of Systems  Constitutional:        Per HPI, otherwise negative  HENT:         Per HPI, otherwise negative  Respiratory:         Per HPI, otherwise negative  Cardiovascular:        Per HPI, otherwise negative  Gastrointestinal:  Negative for vomiting.  Endocrine:       Negative aside from HPI  Genitourinary:         Neg aside from HPI   Musculoskeletal:  Per HPI, otherwise negative  Skin: Negative.   Neurological:  Negative for syncope.   Physical Exam Updated Vital Signs BP 129/73   Pulse 68   Temp (!) 97.4 F (36.3 C) (Oral)   Resp 18   Ht 5' 5.5" (1.664 m)   Wt 68 kg   SpO2 99%   BMI 24.58 kg/m   Physical Exam Vitals and nursing note reviewed.  Constitutional:      General: She is not in acute distress.    Appearance: She is well-developed.  HENT:     Head: Normocephalic and atraumatic.  Eyes:     Conjunctiva/sclera: Conjunctivae normal.  Cardiovascular:     Rate and Rhythm: Tachycardia present. Rhythm irregular.  Pulmonary:     Effort: Pulmonary effort is normal. No respiratory distress.     Breath sounds: Normal breath sounds. No stridor.  Abdominal:     General: There is no distension.  Skin:    General: Skin is warm and dry.  Neurological:     Mental Status: She is alert and oriented to person, place, and time.     Cranial Nerves: No cranial nerve deficit.    ED Results / Procedures / Treatments   Labs (all labs ordered are listed, but only abnormal results are displayed) Labs Reviewed  BASIC METABOLIC PANEL - Abnormal; Notable for the following components:      Result Value   Glucose, Bld 184 (*)    BUN 28 (*)    GFR, Estimated 58 (*)    All other components within normal limits  CBC - Abnormal; Notable for the following components:   RBC 3.47 (*)    Hemoglobin 11.2 (*)    HCT 33.5 (*)    All other components within normal limits  TROPONIN I (HIGH SENSITIVITY) - Abnormal; Notable for the following components:   Troponin I (High Sensitivity) 361 (*)    All other components within normal limits  TROPONIN I (HIGH SENSITIVITY) - Abnormal; Notable for the following components:   Troponin I (High Sensitivity) 303 (*)    All other components within normal limits  MAGNESIUM  HEPARIN LEVEL (UNFRACTIONATED)    EKG None  Radiology DG Chest Port 1  View  Result Date: 03/27/2021 CLINICAL DATA:  Chest pain.  Atrial fibrillation. EXAM: PORTABLE CHEST 1 VIEW COMPARISON:  11/13/2019 FINDINGS: Mild cardiomegaly. Chronic aortic atherosclerosis. 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 No infiltrate, collapse or effusion. Previous breast and chest wall surgery on the right. IMPRESSION: Cardiomegaly. Aortic atherosclerosis. 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 Electronically Signed   By: Nelson Chimes M.D.   On: 03/27/2021 12:48    Procedures Procedures   Medications Ordered in ED Medications  diltiazem (CARDIZEM) 1 mg/mL load via infusion 10 mg (10 mg Intravenous Bolus from Bag 03/27/21 1337)    And  diltiazem (CARDIZEM) 125 mg in dextrose 5% 125 mL (1 mg/mL) infusion (10 mg/hr Intravenous Infusion Verify 03/27/21 1400)  heparin bolus via infusion 4,000 Units (4,000 Units Intravenous Bolus from Bag 03/27/21 1437)    Followed by  heparin ADULT infusion 100 units/mL (25000 units/215mL) (950 Units/hr Intravenous New Bag/Given 03/27/21 1436)    ED Course  I have reviewed the triage vital signs and the nursing notes.  Pertinent labs & imaging results that were available during my care of the patient were reviewed by me and considered in my medical decision making (see chart for details).  Patient in A. fib on monitor, rate 120/130 abnormal Pulse  ox 98% room air normal With concern for new A. fib, duration unknown, but possibly weeks that she is not a candidate for cardioversion, will receive diltiazem bolus, drip. MDM Rules/Calculators/A&P Adult female presents with likely new A. fib with rapid ventricular response.  She is otherwise awake, alert, speaking clearly.  However, given this patient required initiation of bolus, drip diltiazem, continuous cardiac monitoring, pulse oximetry. Final Clinical Impression(s) / ED Diagnoses Final diagnoses:  Atrial fibrillation with RVR ([place])  MDM Number of Diagnoses or  Management Options Atrial fibrillation with RVR ([place]): new, needed workup Chest pain, unspecified type: new, needed workup   Amount and/or Complexity of Data Reviewed Clinical lab tests: ordered and reviewed Tests in the radiology section of CPT: ordered and reviewed Tests in the medicine section of CPT: reviewed and ordered Decide to obtain previous medical records or to obtain history from someone other than the patient: yes Obtain history from someone other than the patient: yes Review and summarize past medical records: yes Discuss the patient with other providers: yes Independent visualization of images, tracings, or specimens: yes  Risk of Complications, Morbidity, and/or Mortality Presenting problems: high Diagnostic procedures: high Management options: high  Critical Care Total time providing critical care: 30-74 minutes (45)  Patient Progress Patient progress: improved    Rx / DC Orders ED Discharge Orders          Ordered    Amb referral to AFIB Clinic        03/27/21 1321             Carmin Muskrat, MD 03/27/21 1637

## 2021-03-27 NOTE — ED Notes (Signed)
Report given to ICU, awaiting covid results prior to moving pt.

## 2021-03-27 NOTE — ED Notes (Addendum)
This nurse called ICU to give report on patient, after being placed on hold x 6 minutes, this nurse aborted phone call and notified charge nurse. Will attempt to call report at a later time.

## 2021-03-27 NOTE — Consult Note (Signed)
Cardiology Consultation:   Patient ID: [name]; [id number]; [birth year]   Admit date: 03/27/2021 Date of Consult: 03/27/2021  Primary Care Provider: Pablo Lawrence, NP Primary Cardiologist: Rozann Lesches, MD Primary Electrophysiologist: None   Patient Profile:   [name] is an 80 y.o. female with a history of medically managed nonobstructive multivessel CAD by cardiac catheterization in September 2021, breast cancer status post right mastectomy, hypertension, arthritis, and GERD who is being seen today for the evaluation of atrial fibrillation with RVR at the request of Dr. Vanita Panda.  History of Present Illness:   [name] presents to the [facility] ER after calling the office earlier today reporting recent shortness of breath and chest discomfort.  She tells me that she has been short of breath with activity for the last 2 weeks, worse in the last few days.  Very short of breath just going to the mailbox yesterday.  Last night she woke up with a feeling of rapid heartbeat and palpitations as well.  She was noted to be in rapid atrial fibrillation on assessment in the ER, now started on IV heparin and IV Cardizem with heart rate coming down and symptomatic improvement.  She has no previous history of documented atrial arrhythmia.  CHA2DS2-VASc score is 5.  Echocardiogram from September 2021 revealed vigorous LVEF at 65 to 70%, mildly dilated left atrium with mild mitral regurgitation.  Her presenting ECG shows rapid atrial fibrillation with diffuse ST-T wave abnormalities, rule out ischemic change.  No active chest pain at this point however.  Initial high-sensitivity troponin I 361.  Past Medical History:  Diagnosis Date   Anemia    Arthritis    Breast cancer ([place])    Right mastectomy   Chronic back pain    Coronary atherosclerosis of native coronary artery    Moderate multivessel disease - medical therapy recommended September 2021   Environmental allergies     Essential hypertension    GERD (gastroesophageal reflux disease)    History of bronchitis    History of colon polyps    History of diverticulosis    History of migraine    Panic attacks    Prolapse of vaginal vault after hysterectomy    Seasonal allergies    Urinary frequency     Past Surgical History:  Procedure Laterality Date   ABDOMINAL HYSTERECTOMY     BACK SURGERY  1981/2011/2014   BLADDER SUSPENSION     BREAST RECONSTRUCTION  2004   with abd tissue   BREAST SURGERY Right    CHOLECYSTECTOMY     COLONOSCOPY  12/2009   RMR: few pancolonic diverticula. next TCS 12/2014   COLONOSCOPY  06/14/2007   XFG:HWEXHBZJIR rectal polyp, status post cold biopsy removal/ Anal canal hemorrhoids/Left-sided diverticula Colonic mucosa appeared normal.Hyperplastic polyp.    COLONOSCOPY  01/18/2004   CVE:LFYBOFB diverticula/Internal hemorrhoids.  Otherwise normal rectum   COLONOSCOPY N/A 04/18/2013   Dr. Gala Romney- normal rectum, scattered left sided diverticula the remainder of the colonic mucosa appeared normal   COLONOSCOPY N/A 08/18/2014   Procedure: COLONOSCOPY;  Surgeon: Daneil Dolin, MD;  [facility];  Service: Endoscopy;  Laterality: N/A;  1000   ESOPHAGOGASTRODUODENOSCOPY  06/14/2007   PZW:CHENID-POEU plaques in esophageal mucosa of uncertain significance brushed and biopsied/Normal stomach, normal first duodenum and second duodenoscopy. KOH negative. esophageal bx c/w GERD   ESOPHAGOGASTRODUODENOSCOPY N/A 08/18/2014   Procedure: ESOPHAGOGASTRODUODENOSCOPY (EGD);  Surgeon: Daneil Dolin, MD;  [facility];  Service: Endoscopy;  Laterality: N/A;   ESOPHAGOGASTRODUODENOSCOPY (EGD) WITH ESOPHAGEAL DILATION N/A 04/18/2013   Dr. Gala Romney- normal, patent appearing, tubular esophagus, normal gastric mucosa, paptent pylorus, normal first and second portion of the duodenum   EYE SURGERY     lasik   FOOT SURGERY Right 2014   d/t plantar fascitis   GIVENS CAPSULE STUDY N/A  07/18/2013   L.[facility] PAC- markedly abnormal appearing gastric mucosa, sugnificant bile reflux, questionable subucosal small bowel mass versus extrinsic compression in the distal small bowel.- CTE= no small bowel mass or tumor seen.   LEFT HEART CATH AND CORONARY ANGIOGRAPHY N/A 01/16/2020   Procedure: LEFT HEART CATH AND CORONARY ANGIOGRAPHY;  Surgeon: Burnell Blanks, MD;  Location: [place] [facility];  Service: Cardiovascular;  Laterality: N/A;   MALONEY DILATION N/A 08/18/2014   Procedure: Venia Minks DILATION;  Surgeon: Daneil Dolin, MD;  Location: [facility];  Service: Endoscopy;  Laterality: N/A;   MASTECTOMY     right side 12-13 years ago   MOUTH SURGERY     cyst removed in June 2022.   TOTAL HIP ARTHROPLASTY Left 02/29/2016   TOTAL HIP ARTHROPLASTY Left 02/29/2016   Procedure: TOTAL HIP ARTHROPLASTY ANTERIOR APPROACH;  Surgeon: Frederik Pear, MD;  Location: [place];  Service: Orthopedics;  Laterality: Left;   TOTAL HIP ARTHROPLASTY Right 01/05/2017   Procedure: TOTAL HIP ARTHROPLASTY ANTERIOR APPROACH;  Surgeon: Frederik Pear, MD;  Location: [place];  Service: Orthopedics;  Laterality: Right;  REQUEST 90 MINS     Outpatient Medications: No current facility-administered medications on file prior to encounter.   Current Outpatient Medications on File Prior to Encounter  Medication Sig Dispense Refill   albuterol (VENTOLIN HFA) 108 (90 Base) MCG/ACT inhaler ProAir HFA 90 mcg/actuation aerosol inhaler  INHALE TWO PUFFS EVERY 6 HOURS AS NEEDED FOR SHORTNESS OF BREATH AND FOR WHEEZING.     amLODipine (NORVASC) 5 MG tablet Take 5 mg by mouth at bedtime.   5   aspirin EC 81 MG tablet Take 1 tablet (81 mg total) by mouth every other day. Swallow whole. 45 tablet 3   diphenhydrAMINE (BENADRYL) 25 MG tablet Take 25 mg by mouth daily as needed for allergies.     diphenhydrAMINE HCl (ZZZQUIL) 50 MG/30ML LIQD Take 50 mg by mouth at bedtime as needed (sleep).     Docusate Calcium (STOOL  SOFTENER PO) Take 1 tablet by mouth daily as needed (constipation).     HYDROcodone-acetaminophen (NORCO) 10-325 MG tablet Take 1 tablet by mouth 2 (two) times daily as needed for moderate pain.      metoprolol succinate (TOPROL-XL) 25 MG 24 hr tablet Take by mouth.     omeprazole (PRILOSEC) 20 MG capsule Take by mouth.     predniSONE (DELTASONE) 20 MG tablet Take 2 tablets (40 mg total) by mouth daily. (Patient not taking: No sig reported) 10 tablet 0   rosuvastatin (CRESTOR) 5 MG tablet TAKE ONE TABLET BY MOUTH DAILY 90 tablet 1   sertraline (ZOLOFT) 25 MG tablet Take 12.5 mg by mouth daily.  (Patient not taking: No sig reported)       Allergies:    Allergies  Allergen Reactions   Penicillins Anaphylaxis, Swelling and Other (See Comments)    Blisters Has patient had a PCN reaction causing immediate rash, facial/tongue/throat swelling, SOB or lightheadedness with hypotension: Yes Has patient had a PCN reaction causing severe rash involving mucus membranes or skin necrosis: No Has patient had a PCN reaction that required hospitalization No Has  patient had a PCN reaction occurring within the last 10 years: No If all of the above answers are "NO", then may proceed with Cephalosporin use.    Apixaban Hives    Social History:   Social History   Tobacco Use   Smoking status: Former    Packs/day: 1.50    Years: 30.00    Pack years: 45.00    Types: Cigarettes    Quit date: 04/28/2000    Years since quitting: 20.9   Smokeless tobacco: Never  Substance Use Topics   Alcohol use: No     Family History:   The patient's family history includes Cancer in her brother and sister; Colon cancer in her brother; Coronary artery disease in her brother, father, and mother; Diabetes in her son; Emphysema in her brother; Healthy in her son, son, son, and son; Lung cancer in her sister.  ROS:  Please see the history of present illness.  All other ROS reviewed and negative.     Physical Exam/Data:    Vitals:   03/27/21 1500 03/27/21 1505 03/27/21 1510 03/27/21 1515  BP: (!) 144/77   124/81  Pulse: (!) 129 84 100 (!) 51  Resp: (!) 22 18 20  (!) 26  Temp:      TempSrc:      SpO2: 97% 99% 98% 97%  Weight:      Height:        Intake/Output Summary (Last 24 hours) at 03/27/2021 1530 Last data filed at 03/27/2021 1400 Gross per 24 hour  Intake 13.18 ml  Output --  Net 13.18 ml   Filed Weights   03/27/21 1305  Weight: 68 kg   Body mass index is 24.58 kg/m.   Gen: Elderly woman, no acute distress. HEENT: Conjunctiva and lids normal, wearing a mask. Neck: Supple, no elevated JVP or carotid bruits, no thyromegaly. Lungs: Clear to auscultation, nonlabored breathing at rest. Cardiac: Irregularly irregular, no S3, 1/6 systolic murmur. Abdomen: Soft, nontender, bowel sounds present. Extremities: No pitting edema, distal pulses 2+. Skin: Warm and dry. Musculoskeletal: No kyphosis. Neuropsychiatric: Alert and oriented x3, affect grossly appropriate.  EKG:  An ECG dated 03/27/2021 was personally reviewed today and demonstrated:  Atrial fibrillation with RVR, diffuse nonspecific ST-T wave changes rule out ischemia, PVC.  Telemetry:  I personally reviewed telemetry which shows atrial fibrillation.  Relevant CV Studies:  Echocardiogram 01/06/2020:  1. Left ventricular ejection fraction, by estimation, is 65 to 70%. The  left ventricle has normal function. The left ventricle has no regional  wall motion abnormalities. Left ventricular diastolic parameters are  indeterminate.   2. Right ventricular systolic function is normal. The right ventricular  size is normal. Tricuspid regurgitation signal is inadequate for assessing  PA pressure.   3. Left atrial size was mildly dilated.   4. The mitral valve is grossly normal. Mild mitral valve regurgitation.   5. The aortic valve is tricuspid. Aortic valve regurgitation is not  visualized.   6. The inferior vena cava is normal in size  with greater than 50%  respiratory variability, suggesting right atrial pressure of 3 mmHg.   Cardiac catheterization 01/16/2020: Mid RCA lesion is 40% stenosed. Prox RCA lesion is 20% stenosed. Dist Cx lesion is 80% stenosed. Prox Cx lesion is 50% stenosed. Mid LAD lesion is 20% stenosed.   1. Moderate stenosis mid Circumflex. This lesion is not flow limiting by functional assessment (DFR 0.96). The small caliber distal Circumflex has a severe stenosis but this vessel appears to  be too small for PCI (1.75 mm vessel).  2. Mild plaque in the LAD 3. The RCA is a large dominant vessel with mild to moderate calcified plaque in the mid and distal vessel.    Recommendations: Medical management of CAD. The stenosis in the mid Circumflex is moderate angiographically but by functional flow wire assessment is not flow limiting (DFR 0.96). The small caliber distal Circumflex has a severe stenosis but this vessel appears to be too small for PCI (1.75 mm vessel).   Laboratory Data:  Chemistry Recent Labs  Lab 03/27/21 1302  NA 136  K 3.9  CL 104  CO2 24  GLUCOSE 184*  BUN 28*  CREATININE 0.98  CALCIUM 9.6  GFRNONAA 58*  ANIONGAP 8     Hematology Recent Labs  Lab 03/27/21 1302  WBC 10.1  RBC 3.47*  HGB 11.2*  HCT 33.5*  MCV 96.5  MCH 32.3  MCHC 33.4  RDW 13.3  PLT 291   Cardiac Enzymes Recent Labs  Lab 03/27/21 1302  TROPONINIHS 361*    Radiology/Studies:  DG Chest Port 1 View  Result Date: 03/27/2021 CLINICAL DATA:  Chest pain.  Atrial fibrillation. EXAM: PORTABLE CHEST 1 VIEW COMPARISON:  11/13/2019 FINDINGS: Mild cardiomegaly. Chronic aortic atherosclerosis. 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 No infiltrate, collapse or effusion. Previous breast and chest wall surgery on the right. IMPRESSION: Cardiomegaly. Aortic atherosclerosis. 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 Electronically Signed   By: Nelson Chimes M.D.   On: 03/27/2021  12:48    Assessment and Plan:   1.  Atrial fibrillation with RVR, newly documented.  Onset most likely within the last 2 weeks based on reported shortness of breath.  Recently chest discomfort and sense of palpitations in the last 24 hours however.  Initial high-sensitivity troponin I is increased at 361, could be demand ischemia in the setting of elevated heart rate, but need further cycle markers to better understand trend and likelihood of ACS.  She has a history of nonobstructive CAD by cardiac catheterization in September of last year.  Left atrium mildly dilated by last assessment.  CHA2DS2-VASc score is 5.  Chart allergy lists apixaban (hives), need to explore this further as she has no prior history of cardiac arrhythmia or anticoagulation from a cardiac specific perspective.  2.  Nonobstructive multivessel CAD by cardiac catheterization in September of last year.  Most significant stenosis was small caliber distal circumflex which was too small for PCI.  As an outpatient she has been on aspirin, Toprol-XL, Norvasc, and Crestor.  Discussed with Dr. Vanita Panda.  Plan is admission to the hospitalist team for further work-up.  She is currently on IV Cardizem and IV heparin.  For now would continue aspirin and Crestor.  Cycle full set of cardiac markers and repeat ECG in the morning.  Follow-up echocardiogram will be obtained as well.  Need more information prior to determining full treatment plan.  If more definitive evidence of ACS, may need to consider cardiac catheterization prior to transition to oral anticoagulation.  Otherwise can plan on conversion to oral regimen and next step in rhythm/rate control.  Signed, Rozann Lesches, MD  03/27/2021 3:30 PM

## 2021-03-27 NOTE — Telephone Encounter (Signed)
Spoke with pt who agrees to be seen in the ER today.

## 2021-03-27 NOTE — ED Notes (Signed)
Date and time results received: 03/27/21 1553 (use smartphrase ".now" to insert current time)  Test: Trop Critical Value: 303  Name of Provider Notified: Dr. Denton Brick  Orders Received? Or Actions Taken?: na

## 2021-03-27 NOTE — H&P (Signed)
History and Physical    [name] KXF:[id number] DOB: 1941-03-09 DOA: 03/27/2021  PCP: Pablo Lawrence, NP   Patient coming from: Home  I have personally briefly reviewed patient's old medical records in [place]  Chief Complaint: Chest pain  HPI: [name] is a 80 y.o. female with medical history significant for asthma coronary artery disease, hypertension. Patient presented to the ED with complaints of difficulty breathing over the past 3 weeks that worsened yesterday. Difficulty breathing is present with exertion.  She reports chest pain that started yesterday, woke her up from sleep.  She also reports palpitations.  Prior to this she has not had chest pains.  She reports some swelling around her ankles over the past 2 to 3 weeks, worse at night and resolves by morning.  She denies history of irregular heart rhythm or atrial fibrillation. Some mild unsteadiness on her feet which she thinks is related to her chronic neck pain for which she has surgery planned, she otherwise denies any falls.  She reports an episode of bleeding per rectum about 4 to 5 months ago.  She had just 1 episode and did not have any before or after that.  No prior GI bleeds.  No history of peptic ulcer disease.  ED Course: temp 97.4.  Tachycardic to 139.  Respiratory rate 12-24.  O2 sats greater than 96% on room air.  Troponin 361 > 303.  EKG showed atrial fibrillation rate 134. MAg- Normal 2. K - 3.9.  Chest x-ray without acute abnormality.  Patient started on Cardizem drip and heparin drip. Cardiology was consulted.  Review of Systems: As per HPI all other systems reviewed and negative.  Past Medical History:  Diagnosis Date   Anemia    Arthritis    Breast cancer ([place])    Right mastectomy   Chronic back pain    Coronary atherosclerosis of native coronary artery    Moderate multivessel disease - medical therapy recommended September 2021   Environmental allergies    Essential hypertension     GERD (gastroesophageal reflux disease)    History of bronchitis    History of colon polyps    History of diverticulosis    History of migraine    Panic attacks    Prolapse of vaginal vault after hysterectomy    Seasonal allergies    Urinary frequency     Past Surgical History:  Procedure Laterality Date   ABDOMINAL HYSTERECTOMY     BACK SURGERY  1981/2011/2014   BLADDER SUSPENSION     BREAST RECONSTRUCTION  2004   with abd tissue   BREAST SURGERY Right    CHOLECYSTECTOMY     COLONOSCOPY  12/2009   RMR: few pancolonic diverticula. next TCS 12/2014   COLONOSCOPY  06/14/2007   IRC:VELFYBOFBP rectal polyp, status post cold biopsy removal/ Anal canal hemorrhoids/Left-sided diverticula Colonic mucosa appeared normal.Hyperplastic polyp.    COLONOSCOPY  01/18/2004   ZWC:HENIDPO diverticula/Internal hemorrhoids.  Otherwise normal rectum   COLONOSCOPY N/A 04/18/2013   Dr. Gala Romney- normal rectum, scattered left sided diverticula the remainder of the colonic mucosa appeared normal   COLONOSCOPY N/A 08/18/2014   Procedure: COLONOSCOPY;  Surgeon: Daneil Dolin, MD;  [facility];  Service: Endoscopy;  Laterality: N/A;  1000   ESOPHAGOGASTRODUODENOSCOPY  06/14/2007   EUM:PNTIRW-ERXV plaques in esophageal mucosa of uncertain significance brushed and biopsied/Normal stomach, normal first duodenum and second duodenoscopy. KOH negative. esophageal bx c/w GERD   ESOPHAGOGASTRODUODENOSCOPY N/A 08/18/2014   Procedure:  ESOPHAGOGASTRODUODENOSCOPY (EGD);  Surgeon: Daneil Dolin, MD;  [facility];  Service: Endoscopy;  Laterality: N/A;   ESOPHAGOGASTRODUODENOSCOPY (EGD) WITH ESOPHAGEAL DILATION N/A 04/18/2013   Dr. Gala Romney- normal, patent appearing, tubular esophagus, normal gastric mucosa, paptent pylorus, normal first and second portion of the duodenum   EYE SURGERY     lasik   FOOT SURGERY Right 2014   d/t plantar fascitis   GIVENS CAPSULE STUDY N/A 07/18/2013   L.Lewis PAC-  markedly abnormal appearing gastric mucosa, sugnificant bile reflux, questionable subucosal small bowel mass versus extrinsic compression in the distal small bowel.- CTE= no small bowel mass or tumor seen.   LEFT HEART CATH AND CORONARY ANGIOGRAPHY N/A 01/16/2020   Procedure: LEFT HEART CATH AND CORONARY ANGIOGRAPHY;  Surgeon: Burnell Blanks, MD;  Location: [facility];  Service: Cardiovascular;  Laterality: N/A;   MALONEY DILATION N/A 08/18/2014   Procedure: Venia Minks DILATION;  Surgeon: Daneil Dolin, MD;  [facility];  Service: Endoscopy;  Laterality: N/A;   MASTECTOMY     right side 12-13 years ago   MOUTH SURGERY     cyst removed in June 2022.   TOTAL HIP ARTHROPLASTY Left 02/29/2016   TOTAL HIP ARTHROPLASTY Left 02/29/2016   Procedure: TOTAL HIP ARTHROPLASTY ANTERIOR APPROACH;  Surgeon: Frederik Pear, MD;  Location: [facility];  Service: Orthopedics;  Laterality: Left;   TOTAL HIP ARTHROPLASTY Right 01/05/2017   Procedure: TOTAL HIP ARTHROPLASTY ANTERIOR APPROACH;  Surgeon: Frederik Pear, MD;  Location: [facility];  Service: Orthopedics;  Laterality: Right;  REQUEST 90 MINS     reports that she quit smoking about 20 years ago. Her smoking use included cigarettes. She has a 45.00 pack-year smoking history. She has never used smokeless tobacco. She reports that she does not drink alcohol and does not use drugs.  Allergies  Allergen Reactions   Penicillins Anaphylaxis, Swelling and Other (See Comments)    Blisters Has patient had a PCN reaction causing immediate rash, facial/tongue/throat swelling, SOB or lightheadedness with hypotension: Yes Has patient had a PCN reaction causing severe rash involving mucus membranes or skin necrosis: No Has patient had a PCN reaction that required hospitalization No Has patient had a PCN reaction occurring within the last 10 years: No If all of the above answers are "NO", then may proceed with Cephalosporin use.    Apixaban Hives     Family History  Problem Relation Age of Onset   Coronary artery disease Mother    Coronary artery disease Father    Cancer Sister        lung cancer, two sisters   Lung cancer Sister    Coronary artery disease Brother    Colon cancer Brother        less than age 59   Cancer Brother    Emphysema Brother    Diabetes Son    Healthy Son    Healthy Son    Healthy Son    Healthy Son    Prior to Admission medications   Medication Sig Start Date End Date Taking? Authorizing Provider  albuterol (VENTOLIN HFA) 108 (90 Base) MCG/ACT inhaler Inhale 2 puffs into the lungs every 6 (six) hours as needed for shortness of breath.   Yes [provider]  amLODipine (NORVASC) 5 MG tablet Take 5 mg by mouth at bedtime.  02/03/14  Yes [provider]  aspirin EC 81 MG tablet Take 1 tablet (81 mg total) by mouth every other day. Swallow  whole. 02/22/20  Yes Satira Sark, MD  diphenhydrAMINE (BENADRYL) 25 MG tablet Take 25 mg by mouth daily as needed for allergies.   Yes [provider]  Docusate Calcium (STOOL SOFTENER PO) Take 1 tablet by mouth daily as needed (constipation).   Yes [provider]  HYDROcodone-acetaminophen (NORCO/VICODIN) 5-325 MG tablet Take 1 tablet by mouth every 8 (eight) hours as needed. 03/16/21  Yes [provider]  metoprolol succinate (TOPROL-XL) 25 MG 24 hr tablet Take 25 mg by mouth daily. 07/25/20  Yes [provider]  omeprazole (PRILOSEC) 20 MG capsule Take by mouth. 07/25/20  Yes [provider]  rosuvastatin (CRESTOR) 5 MG tablet TAKE ONE TABLET BY MOUTH DAILY 02/04/21  Yes Satira Sark, MD  diphenhydrAMINE HCl (ZZZQUIL) 50 MG/30ML LIQD Take 50 mg by mouth at bedtime as needed (sleep). Patient not taking: Reported on 03/27/2021    [provider]  HYDROcodone-acetaminophen (NORCO) 10-325 MG tablet Take 1 tablet by mouth 2 (two) times daily as needed for moderate pain.  Patient not  taking: Reported on 03/27/2021 08/04/19   [provider]  predniSONE (DELTASONE) 20 MG tablet Take 2 tablets (40 mg total) by mouth daily. Patient not taking: Reported on 11/07/2020 10/30/20   Noemi Chapel, MD  sertraline (ZOLOFT) 25 MG tablet Take 12.5 mg by mouth daily.  Patient not taking: Reported on 11/07/2020    [provider]    Physical Exam: Vitals:   03/27/21 2100 03/27/21 2130 03/27/21 2200 03/27/21 2230  BP: (!) 131/54 (!) 120/41 (!) 123/40 (!) 109/47  Pulse: (!) 57 65 62   Resp: 18 12 20  (!) 24  Temp:      TempSrc:      SpO2: 96% 99% 96% 98%  Weight:      Height:        Constitutional: NAD, calm, comfortable Vitals:   03/27/21 2100 03/27/21 2130 03/27/21 2200 03/27/21 2230  BP: (!) 131/54 (!) 120/41 (!) 123/40 (!) 109/47  Pulse: (!) 57 65 62   Resp: 18 12 20  (!) 24  Temp:      TempSrc:      SpO2: 96% 99% 96% 98%  Weight:      Height:       Eyes: PERRL, lids and conjunctivae normal ENMT: Mucous membranes are moist  Neck: normal, supple, no masses, no thyromegaly Respiratory: clear to auscultation bilaterally, no wheezing, no crackles. Normal respiratory effort. No accessory muscle use.  Cardiovascular: Regular rate and rhythm, no murmurs / rubs / gallops. No extremity edema. 2+ pedal pulses.   Abdomen: no tenderness, no masses palpated. No hepatosplenomegaly. Bowel sounds positive.  Musculoskeletal: no clubbing / cyanosis. No joint deformity upper and lower extremities. Good ROM, no contractures. Normal muscle tone.  Skin: no rashes, lesions, ulcers. No induration Neurologic: No apparent cranial nerve abnormality, moving extremities spontaneously. Psychiatric: Normal judgment and insight. Alert and oriented x 3. Normal mood.   Labs on Admission: I have personally reviewed following labs and imaging studies  CBC: Recent Labs  Lab 03/27/21 1302  WBC 10.1  HGB 11.2*  HCT 33.5*  MCV 96.5  PLT 976   Basic Metabolic Panel: Recent Labs  Lab  03/27/21 1302  NA 136  K 3.9  CL 104  CO2 24  GLUCOSE 184*  BUN 28*  CREATININE 0.98  CALCIUM 9.6  MG 2.0    Radiological Exams on Admission: DG Chest Port 1 View  Result Date: 03/27/2021 CLINICAL DATA:  Chest pain.  Atrial fibrillation. EXAM: PORTABLE CHEST 1 VIEW COMPARISON:  11/13/2019 FINDINGS: Mild cardiomegaly. Chronic aortic atherosclerosis. 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 No infiltrate, collapse or effusion. Previous breast and chest wall surgery on the right. IMPRESSION: Cardiomegaly. Aortic atherosclerosis. 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 Electronically Signed   By: Nelson Chimes M.D.   On: 03/27/2021 12:48    EKG: Independently reviewed.  Atrial fibrillation rate 134.  QTc 445.  No significant ST-T wave abnormalities.  Assessment/Plan Principal Problem:   Atrial fibrillation with rapid ventricular response (HCC) Active Problems:   Seropositive rheumatoid arthritis (HCC)   Asthma   Essential hypertension   CAD (coronary artery disease), native coronary artery   New onset atrial fibrillation with RVR-tachycardic to 139.  Potassium 3.9.  Magnesium 2.  Troponin elevated 361 > 303.  Likely demand ischemia.  Rates now 76s. -Per cardiology-Echo, trend troponins, repeat EKG in the morning, continue aspirin, home Toprol 25 daily -Continue Cardizem and heparin drip. - CHADSVasc 5 -Reports some unsteadiness on her feet, but no falls.  Reports 1 episode of bleeding 4 to 5 months ago, she was not evaluated for this.  Hemoglobin stable. -Echocardiogram -Troponin, TSH -Resume home metoprolol  Asthma-stable. -As needed albuterol inhaler  Hypertension-  Stable. -Resume metoprolol Xl 25 daily -Hold Norvasc to allow for titration of rate limiting medications  Coronary artery disease-nonobstructive multivessel CAD by cath 01/16/2020. -Continue aspirin, Crestor   rheumatoid arthritis  -Stable.  DVT prophylaxis: Heparin Code  Status: Full code Family Communication: None at bedside Disposition Plan: ~ 2 days Consults called: Cards Admission status: inpt stepdown I certify that at the point of admission it is my clinical judgment that the patient will require inpatient hospital care spanning beyond 2 midnights from the point of admission due to high intensity of service, high risk for further deterioration and high frequency of surveillance required.    Bethena Roys MD [facility]  03/27/2021, 10:48 PM

## 2021-03-27 NOTE — Telephone Encounter (Signed)
Returned call to pt to give Dr. Myles Gip suggestions. No answer. Left msg to call back.

## 2021-03-27 NOTE — Telephone Encounter (Signed)
Pt states that she has SOB that started 1 week ago and became worse on yesterday. Pt reports that SOB is worse when up moving around. States that she is not SOB when sitting. Pt does c/o some chest heaviness. Chest heaviness started on yesterday. Denies nausea and vomiting. Chest pain is located under right breast and does not move. Rates pain 7/10. Pain comes and goes and stays 4-5 mins. Nothing makes the chest pain better. Please advise

## 2021-03-27 NOTE — Progress Notes (Signed)
Patient asked if she may have something to eat, currently NPO, MD notified regarding request

## 2021-03-27 NOTE — Progress Notes (Signed)
[place] for Heparin Indication: atrial fibrillation  Allergies  Allergen Reactions   Penicillins Anaphylaxis, Swelling and Other (See Comments)    Blisters Has patient had a PCN reaction causing immediate rash, facial/tongue/throat swelling, SOB or lightheadedness with hypotension: Yes Has patient had a PCN reaction causing severe rash involving mucus membranes or skin necrosis: No Has patient had a PCN reaction that required hospitalization No Has patient had a PCN reaction occurring within the last 10 years: No If all of the above answers are "NO", then may proceed with Cephalosporin use.    Apixaban Hives    Patient Measurements: Height: 5\' 5"  (165.1 cm) Weight: 75.7 kg (166 lb 14.2 oz) IBW/kg (Calculated) : 57 HEPARIN DW (KG): 72.6   Vital Signs: Temp: 97.5 F (36.4 C) (11/30 2013) Temp Source: Oral (11/30 2013) BP: 115/62 (11/30 2300) Pulse Rate: 67 (11/30 2300)  Labs: Recent Labs    03/27/21 1302 03/27/21 1505 03/27/21 2153  HGB 11.2*  --   --   HCT 33.5*  --   --   PLT 291  --   --   HEPARINUNFRC  --   --  0.20*  CREATININE 0.98  --   --   TROPONINIHS 361* 303*  --      Estimated Creatinine Clearance: 46.6 mL/min (by C-G formula based on SCr of 0.98 mg/dL).   Medical History: Past Medical History:  Diagnosis Date   Anemia    Arthritis    Breast cancer ([place])    Right mastectomy   Chronic back pain    Coronary atherosclerosis of native coronary artery    Moderate multivessel disease - medical therapy recommended September 2021   Environmental allergies    Essential hypertension    GERD (gastroesophageal reflux disease)    History of bronchitis    History of colon polyps    History of diverticulosis    History of migraine    Panic attacks    Prolapse of vaginal vault after hysterectomy    Seasonal allergies    Urinary frequency     Medications:  See med rec  Assessment: Patient with new onset afib.  Not on oral anticoagulants prior to admission. Pharmacy asked to start heparin  11/30 PM update:  Heparin level low  Goal of Therapy:  Heparin level 0.3-0.7 units/ml Monitor platelets by anticoagulation protocol: Yes   Plan:  Inc heparin to 1050 units/hr Heparin level with AM labs  Narda Bonds, PharmD, [place] Pharmacist Phone: [phone]

## 2021-03-27 NOTE — Telephone Encounter (Signed)
Pt c/o Shortness Of Breath: STAT if SOB developed within the last 24 hours or pt is noticeably SOB on the phone  1. Are you currently SOB (can you hear that pt is SOB on the phone)? no  2. How long have you been experiencing SOB? About a week ago.  Started getting worse yesterday.  3. Are you SOB when sitting or when up moving around? Moving around  4. Are you currently experiencing any other symptoms? no

## 2021-03-28 ENCOUNTER — Inpatient Hospital Stay (HOSPITAL_COMMUNITY): Payer: Medicare Other

## 2021-03-28 ENCOUNTER — Other Ambulatory Visit (HOSPITAL_COMMUNITY): Payer: Self-pay

## 2021-03-28 DIAGNOSIS — R0789 Other chest pain: Secondary | ICD-10-CM

## 2021-03-28 DIAGNOSIS — I4891 Unspecified atrial fibrillation: Secondary | ICD-10-CM

## 2021-03-28 DIAGNOSIS — I2489 Other forms of acute ischemic heart disease: Secondary | ICD-10-CM

## 2021-03-28 DIAGNOSIS — I248 Other forms of acute ischemic heart disease: Secondary | ICD-10-CM

## 2021-03-28 DIAGNOSIS — R079 Chest pain, unspecified: Secondary | ICD-10-CM

## 2021-03-28 DIAGNOSIS — I10 Essential (primary) hypertension: Secondary | ICD-10-CM

## 2021-03-28 LAB — ECHOCARDIOGRAM COMPLETE
AR max vel: 2.2 cm2
AV Area VTI: 2.09 cm2
AV Area mean vel: 2.18 cm2
AV Mean grad: 3.5 mmHg
AV Peak grad: 7.2 mmHg
Ao pk vel: 1.34 m/s
Area-P 1/2: 3.2 cm2
Height: 65 in
MV VTI: 1.79 cm2
S' Lateral: 2.2 cm
Weight: 2670.21 oz

## 2021-03-28 LAB — BASIC METABOLIC PANEL
Anion gap: 6 (ref 5–15)
BUN: 18 mg/dL (ref 8–23)
CO2: 24 mmol/L (ref 22–32)
Calcium: 9.1 mg/dL (ref 8.9–10.3)
Chloride: 107 mmol/L (ref 98–111)
Creatinine, Ser: 0.84 mg/dL (ref 0.44–1.00)
GFR, Estimated: >60 mL/min (ref >60)
Glucose, Bld: 108 mg/dL — ABNORMAL HIGH (ref 70–99)
Potassium: 4.1 mmol/L (ref 3.5–5.1)
Sodium: 137 mmol/L (ref 135–145)

## 2021-03-28 LAB — CBC
HCT: 31.8 % — ABNORMAL LOW (ref 36.0–46.0)
Hemoglobin: 10.4 g/dL — ABNORMAL LOW (ref 12.0–15.0)
MCH: 31.9 pg (ref 26.0–34.0)
MCHC: 32.7 g/dL (ref 30.0–36.0)
MCV: 97.5 fL (ref 80.0–100.0)
Platelets: 243 10*3/uL (ref 150–400)
RBC: 3.26 MIL/uL — ABNORMAL LOW (ref 3.87–5.11)
RDW: 13.3 % (ref 11.5–15.5)
WBC: 9 10*3/uL (ref 4.0–10.5)
nRBC: 0 % (ref 0.0–0.2)

## 2021-03-28 LAB — TSH: TSH: 3.023 u[IU]/mL (ref 0.350–4.500)

## 2021-03-28 LAB — HEPARIN LEVEL (UNFRACTIONATED): Heparin Unfractionated: 0.52 IU/mL (ref 0.30–0.70)

## 2021-03-28 MED ORDER — METOPROLOL TARTRATE 5 MG/5ML IV SOLN: 5.0000 mg | Freq: Once | INTRAVENOUS | Status: AC

## 2021-03-28 MED ORDER — RIVAROXABAN 20 MG PO TABS
20.0000 mg | ORAL_TABLET | Freq: Every day | ORAL | Status: DC
  Administered 2021-03-28: 20 mg via ORAL
  Filled 2021-03-28: qty 1

## 2021-03-28 MED ORDER — METOPROLOL SUCCINATE ER 50 MG PO TB24: 50.0000 mg | ORAL_TABLET | Freq: Every day | ORAL | 2 refills | Status: DC

## 2021-03-28 MED ORDER — DILTIAZEM HCL 25 MG/5ML IV SOLN
10.0000 mg | Freq: Once | INTRAVENOUS | Status: AC
Start: 2021-03-28 — End: 2021-03-28
  Administered 2021-03-28: 10 mg via INTRAVENOUS
  Filled 2021-03-28: qty 5

## 2021-03-28 MED ORDER — METOPROLOL TARTRATE 5 MG/5ML IV SOLN
5.0000 mg | Freq: Once | INTRAVENOUS | Status: AC
  Administered 2021-03-28: 5 mg via INTRAVENOUS

## 2021-03-28 MED ORDER — DILTIAZEM LOAD VIA INFUSION
10.0000 mg | Freq: Once | INTRAVENOUS | Status: DC
  Filled 2021-03-28: qty 10

## 2021-03-28 MED ORDER — DILTIAZEM HCL ER COATED BEADS 120 MG PO CP24: 120.0000 mg | ORAL_CAPSULE | Freq: Every day | ORAL | 3 refills | Status: DC

## 2021-03-28 MED ORDER — RIVAROXABAN 20 MG PO TABS
20.0000 mg | ORAL_TABLET | Freq: Every day | ORAL | 3 refills | Status: DC
Start: 2021-03-29 — End: 2023-05-14

## 2021-03-28 MED ORDER — METOPROLOL SUCCINATE ER 50 MG PO TB24
50.0000 mg | ORAL_TABLET | Freq: Every day | ORAL | Status: DC
  Administered 2021-03-28: 50 mg via ORAL
  Filled 2021-03-28: qty 1

## 2021-03-28 MED ORDER — METOPROLOL TARTRATE 5 MG/5ML IV SOLN
INTRAVENOUS | Status: AC
  Administered 2021-03-28: 5 mg
  Filled 2021-03-28: qty 5

## 2021-03-28 MED ORDER — METOPROLOL TARTRATE 5 MG/5ML IV SOLN
INTRAVENOUS | Status: AC
  Administered 2021-03-28: 5 mg via INTRAVENOUS
  Filled 2021-03-28: qty 5

## 2021-03-28 MED ORDER — METOPROLOL SUCCINATE ER 25 MG PO TB24
25.0000 mg | ORAL_TABLET | Freq: Once | ORAL | Status: AC
  Administered 2021-03-28: 25 mg via ORAL
  Filled 2021-03-28: qty 1

## 2021-03-28 MED ORDER — CHLORHEXIDINE GLUCONATE CLOTH 2 % EX PADS
6.0000 | MEDICATED_PAD | Freq: Every day | CUTANEOUS | Status: DC
  Administered 2021-03-28: 6 via TOPICAL

## 2021-03-28 NOTE — TOC Benefit Eligibility Note (Signed)
Patient Teacher, English as a foreign language completed.    The patient is currently admitted and upon discharge could be taking Xarelto 20 mg.  The current 30 day co-pay is, $47.00.   The patient is insured through [place], [place] Patient Advocate Specialist [place] Patient Advocate Team Direct Number: [phone]  Fax: [phone]

## 2021-03-28 NOTE — Progress Notes (Signed)
[place] for Heparin Indication: atrial fibrillation  Allergies  Allergen Reactions   Penicillins Anaphylaxis, Swelling and Other (See Comments)    Blisters Has patient had a PCN reaction causing immediate rash, facial/tongue/throat swelling, SOB or lightheadedness with hypotension: Yes Has patient had a PCN reaction causing severe rash involving mucus membranes or skin necrosis: No Has patient had a PCN reaction that required hospitalization No Has patient had a PCN reaction occurring within the last 10 years: No If all of the above answers are "NO", then may proceed with Cephalosporin use.    Apixaban Hives    Patient Measurements: Height: 5\' 5"  (165.1 cm) Weight: 75.7 kg (166 lb 14.2 oz) IBW/kg (Calculated) : 57 HEPARIN DW (KG): 72.6   Vital Signs: Temp: 97.5 F (36.4 C) (12/01 0400) Temp Source: Oral (12/01 0400) BP: 121/57 (12/01 0700) Pulse Rate: 98 (12/01 0700)  Labs: Recent Labs    03/27/21 1302 03/27/21 1505 03/27/21 2153 03/28/21 0435  HGB 11.2*  --   --  10.4*  HCT 33.5*  --   --  31.8*  PLT 291  --   --  243  HEPARINUNFRC  --   --  0.20* 0.52  CREATININE 0.98  --   --  0.84  TROPONINIHS 361* 303* 178*  --      Estimated Creatinine Clearance: 54.4 mL/min (by C-G formula based on SCr of 0.84 mg/dL).   Medical History: Past Medical History:  Diagnosis Date   Anemia    Arthritis    Breast cancer ([place])    Right mastectomy   Chronic back pain    Coronary atherosclerosis of native coronary artery    Moderate multivessel disease - medical therapy recommended September 2021   Environmental allergies    Essential hypertension    GERD (gastroesophageal reflux disease)    History of bronchitis    History of colon polyps    History of diverticulosis    History of migraine    Panic attacks    Prolapse of vaginal vault after hysterectomy    Seasonal allergies    Urinary frequency     Medications:  See med  rec  Assessment: Patient with new onset afib. Not on oral anticoagulants prior to admission. Pharmacy asked to start heparin  Heparin level 0.52: therapeutic    Goal of Therapy:  Heparin level 0.3-0.7 units/ml Monitor platelets by anticoagulation protocol: Yes   Plan:  Continue heparin at 1050 units/hr Heparin level with in 8 hours and daily. Monitor H&H and platelets.  Margot Ables, PharmD Clinical Pharmacist 03/28/2021 8:08 AM

## 2021-03-28 NOTE — Progress Notes (Signed)
*  PRELIMINARY RESULTS* Echocardiogram 2D Echocardiogram has been performed.  [name] 03/28/2021, 9:22 AM

## 2021-03-28 NOTE — Progress Notes (Signed)
Progress Note  Patient Name: [name] Date of Encounter: 03/28/2021  Primary Cardiologist: Rozann Lesches, MD  Subjective   Feels better today overall, no palpitations at rest.  No chest pain.  Inpatient Medications    Scheduled Meds:  aspirin EC  81 mg Oral QODAY   Chlorhexidine Gluconate Cloth  6 each Topical Daily   metoprolol succinate  25 mg Oral Daily   rosuvastatin  5 mg Oral Daily   Continuous Infusions:  diltiazem (CARDIZEM) infusion Stopped (03/27/21 2325)   heparin 1,050 Units/hr (03/28/21 0700)   PRN Meds: acetaminophen **OR** acetaminophen, albuterol, HYDROcodone-acetaminophen, ondansetron **OR** ondansetron (ZOFRAN) IV, polyethylene glycol   Vital Signs    Vitals:   03/28/21 0400 03/28/21 0500 03/28/21 0600 03/28/21 0700  BP: (!) 136/93 126/76 (!) 121/48 (!) 121/57  Pulse: 93 63 (!) 48 98  Resp: 20 13 15 12   Temp: (!) 97.5 F (36.4 C)     TempSrc: Oral     SpO2: 98% 96% 93% 96%  Weight:      Height:        Intake/Output Summary (Last 24 hours) at 03/28/2021 0845 Last data filed at 03/28/2021 0700 Gross per 24 hour  Intake 801.43 ml  Output 400 ml  Net 401.43 ml   Filed Weights   03/27/21 1305 03/27/21 1841  Weight: 68 kg 75.7 kg    Telemetry    Atrial fibrillation with heart rate around 100 bpm.  Personally reviewed.  ECG    An ECG dated 03/27/2021 was personally reviewed today and demonstrated:  Atrial fibrillation with PVC, diffuse ST-T wave abnormalities.  Physical Exam   GEN: Elderly woman.  No acute distress.   Neck: No JVD. Cardiac: Irregularly irregular, 1/6 systolic murmur.Marland Kitchen  Respiratory: Nonlabored. Clear to auscultation bilaterally. GI: Soft, nontender, bowel sounds present. MS: No edema; No deformity. Neuro:  Nonfocal. Psych: Alert and oriented x 3. Normal affect.  Labs    Chemistry Recent Labs  Lab 03/27/21 1302 03/28/21 0435  NA 136 137  K 3.9 4.1  CL 104 107  CO2 24 24  GLUCOSE 184* 108*  BUN 28*  18  CREATININE 0.98 0.84  CALCIUM 9.6 9.1  GFRNONAA 58* >60  ANIONGAP 8 6     Hematology Recent Labs  Lab 03/27/21 1302 03/28/21 0435  WBC 10.1 9.0  RBC 3.47* 3.26*  HGB 11.2* 10.4*  HCT 33.5* 31.8*  MCV 96.5 97.5  MCH 32.3 31.9  MCHC 33.4 32.7  RDW 13.3 13.3  PLT 291 243    Cardiac Enzymes Recent Labs  Lab 03/27/21 1302 03/27/21 1505 03/27/21 2153  TROPONINIHS 361* 303* 178*    Radiology    DG Chest Port 1 View  Result Date: 03/27/2021 CLINICAL DATA:  Chest pain.  Atrial fibrillation. EXAM: PORTABLE CHEST 1 VIEW COMPARISON:  11/13/2019 FINDINGS: Mild cardiomegaly. Chronic aortic atherosclerosis. 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 No infiltrate, collapse or effusion. Previous breast and chest wall surgery on the right. IMPRESSION: Cardiomegaly. Aortic atherosclerosis. 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 Electronically Signed   By: Nelson Chimes M.D.   On: 03/27/2021 12:48    Assessment & Plan    1.  Newly documented persistent atrial fibrillation presenting with RVR, onset likely within the last 2 weeks.  CHA2DS2-VASc score is 5.  Heart rate has come down, she was taken off IV diltiazem overnight.  At baseline on Toprol-XL 25 mg daily.  She is on IV heparin.  Reports apparent allergy to  Eliquis years ago (reportedly was on this after a surgery for a brief period of time).  Echocardiogram pending.  2.  Mild elevation in high-sensitivity troponin 9, do not suspect ACS based on trend, most likely demand ischemia with RVR.  3.  History of multivessel nonobstructive CAD by cardiac catheterization in September 2021.  No significant stenosis with small caliber distal circumflex which was too small for PCI, she has been managed medically.  Follow-up echocardiogram today.  Increase Toprol-XL to 50 mg this morning, otherwise continue aspirin and Crestor.  If LVEF remains normal and heart rate is controlled on increased dose of  beta-blocker, anticipate outpatient management and ultimately an elective cardioversion if she does not spontaneously convert in the interim.  Would convert from heparin to Xarelto in that case.  If she has interval development of cardiomyopathy however, we will need to consider follow-up ischemic evaluation as an inpatient.  Signed, Rozann Lesches, MD  03/28/2021, 8:45 AM

## 2021-03-28 NOTE — Discharge Summary (Signed)
Physician Discharge Summary  [name] YDX:[id number] DOB: 07/09/1940 DOA: 03/27/2021  PCP: Pablo Lawrence, NP  Admit date: 03/27/2021 Discharge date: 03/28/2021  Time spent: 35 minutes  Recommendations for Outpatient Follow-up:  Repeat basic metabolic panel to follow renal function, electrolytes trend and instability. Reassess blood pressure and adjust medications as needed.  Discharge Diagnoses:  Principal Problem:   Atrial fibrillation with rapid ventricular response (HCC) Active Problems:   Seropositive rheumatoid arthritis (HCC)   Asthma   Essential hypertension   CAD (coronary artery disease), native coronary artery   Chest pain   Demand ischemia [facility])   Discharge Condition: Stable and improved.  Discharged home with instruction to follow-up with PCP and cardiology as an outpatient.  CODE STATUS: Full code  Diet recommendation: Heart healthy diet.  Filed Weights   03/27/21 1305 03/27/21 1841  Weight: 68 kg 75.7 kg    History of present illness:  As per H&P written by Dr. Denton Brick 03/27/2021 [name] is a 80 y.o. female with medical history significant for asthma coronary artery disease, hypertension. Patient presented to the ED with complaints of difficulty breathing over the past 3 weeks that worsened yesterday. Difficulty breathing is present with exertion.  She reports chest pain that started yesterday, woke her up from sleep.  She also reports palpitations.  Prior to this she has not had chest pains.  She reports some swelling around her ankles over the past 2 to 3 weeks, worse at night and resolves by morning.  She denies history of irregular heart rhythm or atrial fibrillation. Some mild unsteadiness on her feet which she thinks is related to her chronic neck pain for which she has surgery planned, she otherwise denies any falls.  She reports an episode of bleeding per rectum about 4 to 5 months ago.  She had just 1 episode and did not have any before or  after that.  No prior GI bleeds.  No history of peptic ulcer disease.   ED Course: temp 97.4.  Tachycardic to 139.  Respiratory rate 12-24.  O2 sats greater than 96% on room air.  Troponin 361 > 303.  EKG showed atrial fibrillation rate 134. MAg- Normal 2. K - 3.9.  Chest x-ray without acute abnormality.  Patient started on Cardizem drip and heparin drip. Cardiology was consulted.  Hospital Course:  1-new onset atrial fibrillation with RVR -CHADsVASC score 5; patient has been at discharge on xarelto for secondary prevention. -Rate controlled with the use of metoprolol extended release Cardizem (doses divided into morning and evening). -Outpatient follow-up with cardiology service for further management and possible cardioversion. -normal TSH -Goal Patient's electrolytes (potassium more than 4 and magnesium more than 2 as much as possible). -2D echo demonstrated no wall motion abnormalities, preserved ejection fraction and no significant valvular disorder.  2-history of asthma -Stable and currently not experiencing shortness of breath or wheezing -Continue as needed bronchodilators.  3-chronic drainage/rheumatoid arthritis Stable -Continue chronic pain medication.  4-hypertension -Stable and well-controlled-continue the use of metoprolol adjusted dose and also newly added.  5-elevated troponin in the setting of demand ischemia -No acute ischemic changes on telemetry or EKG -Reassuring 2D echo consults -Continue aspirin, statins and metoprolol -Patient to follow-up with cardiology as an outpatient.  6-GERD/GI prophylaxis -Continue PPI.  7-hyperlipidemia -Continue statins  Procedures: 2D echo:   1. Left ventricular ejection fraction, by estimation, is 65 to 70%. The  left ventricle has normal function. The left ventricle has no regional  wall motion abnormalities.  There is mild left ventricular hypertrophy.  Left ventricular diastolic parameters  are indeterminate.   2.  Right ventricular systolic function is normal. The right ventricular  size is normal. There is normal pulmonary artery systolic pressure. The  estimated right ventricular systolic pressure is 52.7 mmHg.   3. Left atrial size was mild to moderately dilated.   4. There is a trivial pericardial effusion posterior to the left  ventricle.   5. The mitral valve is grossly normal. Trivial mitral valve  regurgitation.   6. The aortic valve is tricuspid. Aortic valve regurgitation is not  visualized. Aortic valve sclerosis is present, with no evidence of aortic  valve stenosis. Aortic valve mean gradient measures 3.5 mmHg.   7. The inferior vena cava is normal in size with greater than 50%  respiratory variability, suggesting right atrial pressure of 3 mmHg.   Consultations: Cardiology service  Discharge Exam: Vitals:   03/28/21 1210 03/28/21 1606  BP:    Pulse:    Resp:    Temp: 97.9 F (36.6 C) 97.9 F (36.6 C)  SpO2:      General: Afebrile, no chest pain, no nausea, no vomiting.  Reports no chest pain, no shortness of breath, no palpitations.  Feeling stable to go home. Cardiovascular: Rate controlled, no rubs, no gallops, no JVD on exam. Respiratory: Good air movement bilaterally, no using accessory muscles. Abdomen: Soft, nontender, nondistended, positive bowel sounds Extremities: No cyanosis or clubbing.  Discharge Instructions   Discharge Instructions     Amb referral to AFIB Clinic   Complete by: As directed    Diet - low sodium heart healthy   Complete by: As directed    Discharge instructions   Complete by: As directed    Take medications as prescribed. Maintain adequate hydration. Arrange follow up with cardiology service as instructed/scheduled. Follow heart healthy.   Increase activity slowly   Complete by: As directed       Allergies as of 03/28/2021       Reactions   Penicillins Anaphylaxis, Swelling, Other (See Comments)   Blisters Has patient had a  PCN reaction causing immediate rash, facial/tongue/throat swelling, SOB or lightheadedness with hypotension: Yes Has patient had a PCN reaction causing severe rash involving mucus membranes or skin necrosis: No Has patient had a PCN reaction that required hospitalization No Has patient had a PCN reaction occurring within the last 10 years: No If all of the above answers are "NO", then may proceed with Cephalosporin use.   Apixaban Hives        Medication List     STOP taking these medications    amLODipine 5 MG tablet Commonly known as: NORVASC   predniSONE 20 MG tablet Commonly known as: DELTASONE   sertraline 25 MG tablet Commonly known as: ZOLOFT   ZzzQuil 50 MG/30ML Liqd Generic drug: diphenhydrAMINE HCl       TAKE these medications    albuterol 108 (90 Base) MCG/ACT inhaler Commonly known as: VENTOLIN HFA Inhale 2 puffs into the lungs every 6 (six) hours as needed for shortness of breath.   aspirin EC 81 MG tablet Take 1 tablet (81 mg total) by mouth every other day. Swallow whole.   diltiazem 120 MG 24 hr capsule Commonly known as: Cardizem CD Take 1 capsule (120 mg total) by mouth daily. evening time.   diphenhydrAMINE 25 MG tablet Commonly known as: BENADRYL Take 25 mg by mouth daily as needed for allergies.   HYDROcodone-acetaminophen 5-325 MG tablet  Commonly known as: NORCO/VICODIN Take 1 tablet by mouth every 8 (eight) hours as needed. What changed: Another medication with the same name was removed. Continue taking this medication, and follow the directions you see here.   metoprolol succinate 50 MG 24 hr tablet Commonly known as: TOPROL-XL Take 1 tablet (50 mg total) by mouth daily. Take with or immediately following a meal in the morning.. Start taking on: March 29, 2021 What changed:  medication strength how much to take additional instructions   omeprazole 20 MG capsule Commonly known as: PRILOSEC Take by mouth.   rivaroxaban 20 MG  Tabs tablet Commonly known as: XARELTO Take 1 tablet (20 mg total) by mouth daily with supper. Start taking on: March 29, 2021   rosuvastatin 5 MG tablet Commonly known as: CRESTOR TAKE ONE TABLET BY MOUTH DAILY   STOOL SOFTENER PO Take 1 tablet by mouth daily as needed (constipation).       Allergies  Allergen Reactions   Penicillins Anaphylaxis, Swelling and Other (See Comments)    Blisters Has patient had a PCN reaction causing immediate rash, facial/tongue/throat swelling, SOB or lightheadedness with hypotension: Yes Has patient had a PCN reaction causing severe rash involving mucus membranes or skin necrosis: No Has patient had a PCN reaction that required hospitalization No Has patient had a PCN reaction occurring within the last 10 years: No If all of the above answers are "NO", then may proceed with Cephalosporin use.    Apixaban Hives    Follow-up Information     Theora Gianotti, NP Follow up on 04/24/2021.   Specialties: Nurse Practitioner, Cardiology, Radiology Why: Cardiology Hospital Follow-up on 04/24/2021 at 1:50 PM. Contact information: [place] [address] [phone]         Pablo Lawrence, NP. Schedule an appointment as soon as possible for a visit in 2 week(s).   Specialty: Adult Health Nurse Practitioner Contact information: [address] [place] [address] [phone]         Satira Sark, MD .   Specialty: Cardiology Contact information: [place] [address] [phone]                 The results of significant diagnostics from this hospitalization (including imaging, microbiology, ancillary and laboratory) are listed below for reference.    Significant Diagnostic Studies: DG Chest Port 1 View  Result Date: 03/27/2021 CLINICAL DATA:  Chest pain.  Atrial fibrillation. EXAM: PORTABLE CHEST 1 VIEW COMPARISON:  11/13/2019 FINDINGS: Mild cardiomegaly.  Chronic aortic atherosclerosis. 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 No infiltrate, collapse or effusion. Previous breast and chest wall surgery on the right. IMPRESSION: Cardiomegaly. Aortic atherosclerosis. 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 Electronically Signed   By: Nelson Chimes M.D.   On: 03/27/2021 12:48   ECHOCARDIOGRAM COMPLETE  Result Date: 03/28/2021    ECHOCARDIOGRAM REPORT   Patient Name:   CHRISTIONNA POLAND Date of Exam: 03/28/2021 Medical Rec #:  [id number]      Height:       65.0 in Accession #:    [id number]     Weight:       166.9 lb Date of Birth:  May 21, 1940     BSA:          1.832 m Patient Age:    13 years       BP:  121/57 mmHg Patient Gender: F              HR:           98 bpm. Exam Location:  [facility] Procedure: 2D Echo, Cardiac Doppler and Color Doppler Indications:    Atrial Fibrillation  History:        Patient has prior history of Echocardiogram examinations, most                 recent 01/06/2020. CAD, Arrythmias:Atrial Fibrillation and                 Tachycardia, Signs/Symptoms:Shortness of Breath and Dyspnea;                 Risk Factors:Hypertension and Dyslipidemia. Breast CA.  Sonographer:    Wenda Low Referring Phys: [place]  1. Left ventricular ejection fraction, by estimation, is 65 to 70%. The left ventricle has normal function. The left ventricle has no regional wall motion abnormalities. There is mild left ventricular hypertrophy. Left ventricular diastolic parameters are indeterminate.  2. Right ventricular systolic function is normal. The right ventricular size is normal. There is normal pulmonary artery systolic pressure. The estimated right ventricular systolic pressure is 74.8 mmHg.  3. Left atrial size was mild to moderately dilated.  4. There is a trivial pericardial effusion posterior to the left ventricle.  5. The mitral valve is grossly normal. Trivial mitral valve  regurgitation.  6. The aortic valve is tricuspid. Aortic valve regurgitation is not visualized. Aortic valve sclerosis is present, with no evidence of aortic valve stenosis. Aortic valve mean gradient measures 3.5 mmHg.  7. The inferior vena cava is normal in size with greater than 50% respiratory variability, suggesting right atrial pressure of 3 mmHg. Comparison(s): Prior images reviewed side by side. LVEF remains normal without wall motion abnormalities. FINDINGS  Left Ventricle: Left ventricular ejection fraction, by estimation, is 65 to 70%. The left ventricle has normal function. The left ventricle has no regional wall motion abnormalities. The left ventricular internal cavity size was normal in size. There is  mild left ventricular hypertrophy. Left ventricular diastolic function could not be evaluated due to atrial fibrillation. Left ventricular diastolic parameters are indeterminate. Right Ventricle: The right ventricular size is normal. No increase in right ventricular wall thickness. Right ventricular systolic function is normal. There is normal pulmonary artery systolic pressure. The tricuspid regurgitant velocity is 2.53 m/s, and  with an assumed right atrial pressure of 3 mmHg, the estimated right ventricular systolic pressure is 27.0 mmHg. Left Atrium: Left atrial size was mild to moderately dilated. Right Atrium: Right atrial size was normal in size. Pericardium: Trivial pericardial effusion is present. The pericardial effusion is posterior to the left ventricle. Mitral Valve: The mitral valve is grossly normal. Mild mitral annular calcification. Trivial mitral valve regurgitation. MV peak gradient, 9.6 mmHg. The mean mitral valve gradient is 3.0 mmHg. Tricuspid Valve: The tricuspid valve is grossly normal. Tricuspid valve regurgitation is mild. Aortic Valve: The aortic valve is tricuspid. There is mild aortic valve annular calcification. Aortic valve regurgitation is not visualized. Aortic valve  sclerosis is present, with no evidence of aortic valve stenosis. Aortic valve mean gradient measures  3.5 mmHg. Aortic valve peak gradient measures 7.2 mmHg. Aortic valve area, by VTI measures 2.09 cm. Pulmonic Valve: The pulmonic valve was grossly normal. Pulmonic valve regurgitation is trivial. Aorta: The aortic root is normal in size and structure. Venous: The inferior vena cava  is normal in size with greater than 50% respiratory variability, suggesting right atrial pressure of 3 mmHg. IAS/Shunts: No atrial level shunt detected by color flow Doppler.  LEFT VENTRICLE PLAX 2D LVIDd:         3.50 cm   Diastology LVIDs:         2.20 cm   LV e' medial:    8.70 cm/s LV PW:         1.30 cm   LV E/e' medial:  16.4 LV IVS:        1.30 cm   LV e' lateral:   10.10 cm/s LVOT diam:     1.90 cm   LV E/e' lateral: 14.2 LV SV:         57 LV SV Index:   31 LVOT Area:     2.84 cm  RIGHT VENTRICLE RV Basal diam:  3.10 cm RV Mid diam:    2.30 cm RV S prime:     22.20 cm/s TAPSE (M-mode): 2.6 cm LEFT ATRIUM             Index        RIGHT ATRIUM           Index LA diam:        3.40 cm 1.86 cm/m   RA Area:     14.80 cm LA Vol (A2C):   60.8 ml 33.19 ml/m  RA Volume:   33.20 ml  18.13 ml/m LA Vol (A4C):   76.6 ml 41.82 ml/m LA Biplane Vol: 72.4 ml 39.53 ml/m  AORTIC VALVE                    PULMONIC VALVE AV Area (Vmax):    2.20 cm     PV Vmax:       1.02 m/s AV Area (Vmean):   2.18 cm     PV Peak grad:  4.2 mmHg AV Area (VTI):     2.09 cm AV Vmax:           134.00 cm/s AV Vmean:          88.650 cm/s AV VTI:            0.274 m AV Peak Grad:      7.2 mmHg AV Mean Grad:      3.5 mmHg LVOT Vmax:         104.00 cm/s LVOT Vmean:        68.300 cm/s LVOT VTI:          0.202 m LVOT/AV VTI ratio: 0.74  AORTA Ao Root diam: 2.70 cm MITRAL VALVE                TRICUSPID VALVE MV Area (PHT): 3.20 cm     TR Peak grad:   25.6 mmHg MV Area VTI:   1.79 cm     TR Vmax:        253.00 cm/s MV Peak grad:  9.6 mmHg MV Mean grad:  3.0 mmHg      SHUNTS MV Vmax:       1.55 m/s     Systemic VTI:  0.20 m MV Vmean:      81.5 cm/s    Systemic Diam: 1.90 cm MV Decel Time: 237 msec MV E velocity: 143.00 cm/s Rozann Lesches MD Electronically signed by Rozann Lesches MD Signature Date/Time: 03/28/2021/10:48:02 AM    Final     Microbiology: Recent Results (from the past 240 hour(s))  Resp  Panel by RT-PCR (Flu A&B, Covid) Nasopharyngeal Swab     Status: None   Collection Time: 03/27/21  5:25 PM   Specimen: Nasopharyngeal Swab; Nasopharyngeal(NP) swabs in vial transport medium  Result Value Ref Range Status   SARS Coronavirus 2 by RT PCR NEGATIVE NEGATIVE Final    Comment: (NOTE) SARS-CoV-2 target nucleic acids are NOT DETECTED.  The SARS-CoV-2 RNA is generally detectable in upper respiratory specimens during the acute phase of infection. The lowest concentration of SARS-CoV-2 viral copies this assay can detect is 138 copies/mL. A negative result does not preclude SARS-Cov-2 infection and should not be used as the sole basis for treatment or other patient management decisions. A negative result may occur with  improper specimen collection/handling, submission of specimen other than nasopharyngeal swab, presence of viral mutation(s) within the areas targeted by this assay, and inadequate number of viral copies(<138 copies/mL). A negative result must be combined with clinical observations, patient history, and epidemiological information. The expected result is Negative.  Fact Sheet for Patients:  EntrepreneurPulse.com.au  Fact Sheet for Healthcare Providers:  IncredibleEmployment.be  This test is no t yet approved or cleared by the [place] FDA and  has been authorized for detection and/or diagnosis of SARS-CoV-2 by FDA under an Emergency Use Authorization (EUA). This EUA will remain  in effect (meaning this test can be used) for the duration of the COVID-19 declaration under Section 564(b)(1)  of the Act, 21 U.S.C.section 360bbb-3(b)(1), unless the authorization is terminated  or revoked sooner.       Influenza A by PCR NEGATIVE NEGATIVE Final   Influenza B by PCR NEGATIVE NEGATIVE Final    Comment: (NOTE) The Xpert Xpress SARS-CoV-2/FLU/RSV plus assay is intended as an aid in the diagnosis of influenza from Nasopharyngeal swab specimens and should not be used as a sole basis for treatment. Nasal washings and aspirates are unacceptable for Xpert Xpress SARS-CoV-2/FLU/RSV testing.  Fact Sheet for Patients: EntrepreneurPulse.com.au  Fact Sheet for Healthcare Providers: IncredibleEmployment.be  This test is not yet approved or cleared by the [place] FDA and has been authorized for detection and/or diagnosis of SARS-CoV-2 by FDA under an Emergency Use Authorization (EUA). This EUA will remain in effect (meaning this test can be used) for the duration of the COVID-19 declaration under Section 564(b)(1) of the Act, 21 U.S.C. section 360bbb-3(b)(1), unless the authorization is terminated or revoked.  Performed at [facility], [address], [place], [place] [address]   MRSA Next Gen by PCR, Nasal     Status: None   Collection Time: 03/27/21  6:44 PM   Specimen: Nasal Mucosa; Nasal Swab  Result Value Ref Range Status   MRSA by PCR Next Gen NOT DETECTED NOT DETECTED Final    Comment: (NOTE) The GeneXpert MRSA Assay (FDA approved for NASAL specimens only), is one component of a comprehensive MRSA colonization surveillance program. It is not intended to diagnose MRSA infection nor to guide or monitor treatment for MRSA infections. Test performance is not FDA approved in patients less than 8 years old. Performed at [facility], [address], [place], [place] [address]      Labs: Basic Metabolic Panel: Recent Labs  Lab 03/27/21 1302 03/28/21 0435  NA 136 137  K 3.9 4.1  CL 104 107  CO2 24 24  GLUCOSE 184* 108*  BUN  28* 18  CREATININE 0.98 0.84  CALCIUM 9.6 9.1  MG 2.0  --    CBC: Recent Labs  Lab 03/27/21 1302 03/28/21  0435  WBC 10.1 9.0  HGB 11.2* 10.4*  HCT 33.5* 31.8*  MCV 96.5 97.5  PLT 291 243    Signed:  Barton Dubois MD.  [facility] 03/28/2021, 5:14 PM

## 2021-03-28 NOTE — Progress Notes (Signed)
1745 Patient provided with printed discharge instructions & reviewed with this RN, patient confirmed that she would call to schedule follow-up appointments with PCP & cardiology, this RN spoke with patient's pharmacy with patient present & confirmed they had received the patient's Rxs, 2 IV catheters removed from patient's LEFT arm intact with no complications noted at this time, confirmed she had all of her belongings, patient's son arrived at patient's room to drive her home, this RN transported the patient to son's truck via wheelchair

## 2021-03-28 NOTE — Progress Notes (Signed)
[name] for Heparin>>xarelto Indication: atrial fibrillation  Allergies  Allergen Reactions   Penicillins Anaphylaxis, Swelling and Other (See Comments)    Blisters Has patient had a PCN reaction causing immediate rash, facial/tongue/throat swelling, SOB or lightheadedness with hypotension: Yes Has patient had a PCN reaction causing severe rash involving mucus membranes or skin necrosis: No Has patient had a PCN reaction that required hospitalization No Has patient had a PCN reaction occurring within the last 10 years: No If all of the above answers are "NO", then may proceed with Cephalosporin use.    Apixaban Hives    Patient Measurements: Height: 5\' 5"  (165.1 cm) Weight: 75.7 kg (166 lb 14.2 oz) IBW/kg (Calculated) : 57 HEPARIN DW (KG): 72.6   Vital Signs: Temp: 97.9 F (36.6 C) (12/01 1210) Temp Source: Axillary (12/01 1210) BP: 136/57 (12/01 0957) Pulse Rate: 107 (12/01 0957)  Labs: Recent Labs    03/27/21 1302 03/27/21 1505 03/27/21 2153 03/28/21 0435  HGB 11.2*  --   --  10.4*  HCT 33.5*  --   --  31.8*  PLT 291  --   --  243  HEPARINUNFRC  --   --  0.20* 0.52  CREATININE 0.98  --   --  0.84  TROPONINIHS 361* 303* 178*  --      Estimated Creatinine Clearance: 54.4 mL/min (by C-G formula based on SCr of 0.84 mg/dL).   Medical History: Past Medical History:  Diagnosis Date   Anemia    Arthritis    Breast cancer ([place])    Right mastectomy   Chronic back pain    Coronary atherosclerosis of native coronary artery    Moderate multivessel disease - medical therapy recommended September 2021   Environmental allergies    Essential hypertension    GERD (gastroesophageal reflux disease)    History of bronchitis    History of colon polyps    History of diverticulosis    History of migraine    Panic attacks    Prolapse of vaginal vault after hysterectomy    Seasonal allergies    Urinary frequency     Medications:   See med rec  Assessment: Patient with new onset afib. Not on oral anticoagulants prior to admission. Pharmacy asked to transition from heparin to Xarelto   Goal of Therapy:   Monitor platelets by anticoagulation protocol: Yes   Plan:  Stop heparin infusion Start Xarelto 20 mg daily. Monitor H&H and s/s of bleeding.  Margot Ables, PharmD Clinical Pharmacist 03/28/2021 12:31 PM

## 2021-03-29 ENCOUNTER — Telehealth: Payer: Self-pay | Admitting: Cardiology

## 2021-03-29 DIAGNOSIS — I4891 Unspecified atrial fibrillation: Secondary | ICD-10-CM

## 2021-03-29 NOTE — Telephone Encounter (Signed)
Patient wanted to talk to Dr. Domenic Polite or his Nurse. She wants a sooner appointment. She is on the wait list but she didn't feel that was acceptable. Please call the patient

## 2021-04-01 NOTE — Telephone Encounter (Signed)
Pt is scheduled for dental implant on 04/03/21. Pt is currently taking Xarelto 20 mg tablets. Pt is unsure if she should go through with implant d/t being on a blood thinner.   I will fwd to provider and clearance pool for clearance.

## 2021-04-01 NOTE — Telephone Encounter (Signed)
Pt agreeable to A Fib clinic, as there are no available appts for APP's at the moment

## 2021-04-01 NOTE — Telephone Encounter (Signed)
Melinda Sark, MD   She was just hospitalized with newly documented rapid atrial fibrillation and started on anticoagulation as well as heart rate control strategy.  I would presume that she would need to hold anticoagulation for dental implants although do not have the details of the procedure.  If she does stop anticoagulation temporarily, this will defer somewhat our plans for potential cardioversion, but as long as her heart rate is controlled that would not be unreasonable.  Please see if we can get her in for a quicker appointment with APP or even the atrial fibrillation clinic if she is agreeable.

## 2021-04-04 ENCOUNTER — Telehealth: Payer: Self-pay

## 2021-04-04 NOTE — Telephone Encounter (Signed)
pt on the line calling back in regards to being scheduled with the Afib clinic... pt says she hasnt heard anything back

## 2021-04-04 NOTE — Telephone Encounter (Signed)
Spoke to [employer] via secured chat who stated that pt would not be seen in [facility] d/ seeing Angelica Ran, NP on 04/24/2021. Pt verbalized understanding when called.

## 2021-04-10 ENCOUNTER — Encounter (HOSPITAL_COMMUNITY): Admission: RE | Payer: Self-pay | Source: Home / Self Care

## 2021-04-10 ENCOUNTER — Ambulatory Visit (HOSPITAL_COMMUNITY): Admission: RE | Admit: 2021-04-10 | Payer: Medicare Other | Source: Home / Self Care | Admitting: Orthopedic Surgery

## 2021-04-10 SURGERY — ANTERIOR CERVICAL DECOMPRESSION/DISCECTOMY FUSION 2 LEVELS
Anesthesia: General

## 2021-04-24 ENCOUNTER — Other Ambulatory Visit (HOSPITAL_COMMUNITY)
Admission: RE | Admit: 2021-04-24 | Discharge: 2021-04-24 | Disposition: A | Discharge status: Home / Self Care | Payer: Medicare Other | Source: Ambulatory Visit | Attending: Nurse Practitioner | Admitting: Nurse Practitioner

## 2021-04-24 ENCOUNTER — Ambulatory Visit: Payer: Medicare Other | Admitting: Nurse Practitioner

## 2021-04-24 ENCOUNTER — Other Ambulatory Visit: Payer: Self-pay

## 2021-04-24 ENCOUNTER — Telehealth: Payer: Self-pay

## 2021-04-24 ENCOUNTER — Encounter: Payer: Self-pay | Admitting: Nurse Practitioner

## 2021-04-24 VITALS — BP 160/78 | HR 125 | Ht 65.5 in | Wt 161.0 lb

## 2021-04-24 DIAGNOSIS — I4891 Unspecified atrial fibrillation: Secondary | ICD-10-CM | POA: Diagnosis present

## 2021-04-24 DIAGNOSIS — Z01818 Encounter for other preprocedural examination: Secondary | ICD-10-CM | POA: Diagnosis present

## 2021-04-24 DIAGNOSIS — E785 Hyperlipidemia, unspecified: Secondary | ICD-10-CM

## 2021-04-24 DIAGNOSIS — I25119 Atherosclerotic heart disease of native coronary artery with unspecified angina pectoris: Secondary | ICD-10-CM | POA: Diagnosis not present

## 2021-04-24 DIAGNOSIS — I10 Essential (primary) hypertension: Secondary | ICD-10-CM

## 2021-04-24 LAB — CBC
HCT: 35.1 % — ABNORMAL LOW (ref 36.0–46.0)
Hemoglobin: 11.8 g/dL — ABNORMAL LOW (ref 12.0–15.0)
MCH: 32.2 pg (ref 26.0–34.0)
MCHC: 33.6 g/dL (ref 30.0–36.0)
MCV: 95.9 fL (ref 80.0–100.0)
Platelets: 324 10*3/uL (ref 150–400)
RBC: 3.66 MIL/uL — ABNORMAL LOW (ref 3.87–5.11)
RDW: 13.3 % (ref 11.5–15.5)
WBC: 10 10*3/uL (ref 4.0–10.5)
nRBC: 0 % (ref 0.0–0.2)

## 2021-04-24 LAB — BASIC METABOLIC PANEL
Anion gap: 7 (ref 5–15)
BUN: 27 mg/dL — ABNORMAL HIGH (ref 8–23)
CO2: 26 mmol/L (ref 22–32)
Calcium: 9.7 mg/dL (ref 8.9–10.3)
Chloride: 105 mmol/L (ref 98–111)
Creatinine, Ser: 1.07 mg/dL — ABNORMAL HIGH (ref 0.44–1.00)
GFR, Estimated: 52 mL/min — ABNORMAL LOW (ref >60)
Glucose, Bld: 122 mg/dL — ABNORMAL HIGH (ref 70–99)
Potassium: 4.3 mmol/L (ref 3.5–5.1)
Sodium: 138 mmol/L (ref 135–145)

## 2021-04-24 MED ORDER — METOPROLOL SUCCINATE ER 100 MG PO TB24: 100.0000 mg | ORAL_TABLET | Freq: Every day | ORAL | 3 refills | Status: DC

## 2021-04-24 NOTE — Telephone Encounter (Signed)
Pre -op apt 04/26/21 at [facility] for cardioversion scheduled on 04/30/20 with Dr.Branch   Patient advised to be NPO after midnight on 02/28/21 and to take am meds with sip of water only.   She will have some one bring her and stay with her for 24 hours after

## 2021-04-24 NOTE — Progress Notes (Signed)
Cardiology Clinic Note   Patient Name: [name] Date of Encounter: 04/24/2021  Primary Care Provider:  Pablo Lawrence, NP Primary Cardiologist:  Rozann Lesches, MD  Patient Profile    80 year old female with a history of nonobstructive CAD, hypertension, hyperlipidemia, breast cancer status post right mastectomy, arthritis, and GERD, who presents for follow-up due to persistent atrial fibrillation.  Past Medical History    Past Medical History:  Diagnosis Date   Anemia    Arthritis    Breast cancer ([place])    Right mastectomy   Chronic back pain    Coronary atherosclerosis of native coronary artery    a. 12/2019 Cath: LM nl, LAD 59m, LCX 50p, 80d, RCA 20p, 26m-->med rx.   Demand ischemia ([place])    a. 02/2021 HsTrop to 361 in setting of rapid afib; b. 02/2021 Echo: EF 65-70%, no rwma, mild LVH, RVSP 28.27mmHg. Mildly to mod dil LA. Triv MR. Ao sclerosis w/o stenosis.   Environmental allergies    Essential hypertension    GERD (gastroesophageal reflux disease)    History of bronchitis    History of colon polyps    History of diverticulosis    History of migraine    Panic attacks    Persistent atrial fibrillation ([place]) 02/2021   a. CHA2DS2VASc = 5-->xarelto.   Prolapse of vaginal vault after hysterectomy    Seasonal allergies    Urinary frequency    Past Surgical History:  Procedure Laterality Date   ABDOMINAL HYSTERECTOMY     BACK SURGERY  1981/2011/2014   BLADDER SUSPENSION     BREAST RECONSTRUCTION  2004   with abd tissue   BREAST SURGERY Right    CHOLECYSTECTOMY     COLONOSCOPY  12/2009   RMR: few pancolonic diverticula. next TCS 12/2014   COLONOSCOPY  06/14/2007   YFV:[id number] rectal polyp, status post cold biopsy removal/ Anal canal hemorrhoids/Left-sided diverticula Colonic mucosa appeared normal.Hyperplastic polyp.    COLONOSCOPY  01/18/2004   MBW:[id number] diverticula/Internal hemorrhoids.  Otherwise normal rectum   COLONOSCOPY N/A 04/18/2013   Dr.  Gala Romney- normal rectum, scattered left sided diverticula the remainder of the colonic mucosa appeared normal   COLONOSCOPY N/A 08/18/2014   Procedure: COLONOSCOPY;  Surgeon: Daneil Dolin, MD;  [facility];  Service: Endoscopy;  Laterality: N/A;  1000   ESOPHAGOGASTRODUODENOSCOPY  06/14/2007   TTS:[id number] plaques in esophageal mucosa of uncertain significance brushed and biopsied/Normal stomach, normal first duodenum and second duodenoscopy. KOH negative. esophageal bx c/w GERD   ESOPHAGOGASTRODUODENOSCOPY N/A 08/18/2014   Procedure: ESOPHAGOGASTRODUODENOSCOPY (EGD);  Surgeon: Daneil Dolin, MD;  [facility];  Service: Endoscopy;  Laterality: N/A;   ESOPHAGOGASTRODUODENOSCOPY (EGD) WITH ESOPHAGEAL DILATION N/A 04/18/2013   Dr. Gala Romney- normal, patent appearing, tubular esophagus, normal gastric mucosa, paptent pylorus, normal first and second portion of the duodenum   EYE SURGERY     lasik   FOOT SURGERY Right 2014   d/t plantar fascitis   GIVENS CAPSULE STUDY N/A 07/18/2013   L.Lewis PAC- markedly abnormal appearing gastric mucosa, sugnificant bile reflux, questionable subucosal small bowel mass versus extrinsic compression in the distal small bowel.- CTE= no small bowel mass or tumor seen.   LEFT HEART CATH AND CORONARY ANGIOGRAPHY N/A 01/16/2020   Procedure: LEFT HEART CATH AND CORONARY ANGIOGRAPHY;  Surgeon: Burnell Blanks, MD;  Location: [facility];  Service: Cardiovascular;  Laterality: N/A;   MALONEY DILATION N/A 08/18/2014   Procedure: Venia Minks DILATION;  Surgeon: Daneil Dolin,  MD;  [facility];  Service: Endoscopy;  Laterality: N/A;   MASTECTOMY     right side 12-13 years ago   MOUTH SURGERY     cyst removed in June 2022.   TOTAL HIP ARTHROPLASTY Left 02/29/2016   TOTAL HIP ARTHROPLASTY Left 02/29/2016   Procedure: TOTAL HIP ARTHROPLASTY ANTERIOR APPROACH;  Surgeon: Frederik Pear, MD;  Location: [facility];  Service: Orthopedics;   Laterality: Left;   TOTAL HIP ARTHROPLASTY Right 01/05/2017   Procedure: TOTAL HIP ARTHROPLASTY ANTERIOR APPROACH;  Surgeon: Frederik Pear, MD;  Location: [facility];  Service: Orthopedics;  Laterality: Right;  REQUEST 90 MINS    Allergies  Allergies  Allergen Reactions   Penicillins Anaphylaxis, Swelling and Other (See Comments)    Blisters Has patient had a PCN reaction causing immediate rash, facial/tongue/throat swelling, SOB or lightheadedness with hypotension: Yes Has patient had a PCN reaction causing severe rash involving mucus membranes or skin necrosis: No Has patient had a PCN reaction that required hospitalization No Has patient had a PCN reaction occurring within the last 10 years: No If all of the above answers are "NO", then may proceed with Cephalosporin use.    Apixaban Hives    History of Present Illness    80 year old female with the above past medical history including nonobstructive CAD by diagnostic catheterization in September 2021, hypertension, hyperlipidemia, breast cancer status post right mastectomy, arthritis, and GERD.  She presented to the [facility] emergency department on November 30 with complaints of chest pain and shortness of breath x2 weeks.  She was found to be in atrial fibrillation with rapid ventricular response with placed on IV heparin and Cardizem with improved rates and symptoms.  Troponin was elevated at 361 with a relatively flat trend and this was felt to represent demand ischemia in the setting of rapid heart rates.  Echocardiogram showed an EF of 65 to 70% without regional wall motion abnormalities or significant valvular disease.  Diltiazem was transitioned to oral diltiazem as well as metoprolol 50 mg daily.  She was placed on Xarelto therapy (previously developed hives on Eliquis), and discharged with plan for outpatient follow-up and eventual cardioversion if necessary.  Since discharge, patient notes that she has continued to experience  dyspnea on exertion with minimal activity.  She has had a few episodes of tightness in her chest.  She does not necessarily notice palpitations.  She denies PND, orthopnea, dizziness, syncope, edema, or early satiety.  She reports compliance with her medications, in particular Xarelto, and is interested in pursuing cardioversion.  Home Medications    Current Outpatient Medications  Medication Sig Dispense Refill   albuterol (VENTOLIN HFA) 108 (90 Base) MCG/ACT inhaler Inhale 2 puffs into the lungs every 6 (six) hours as needed for shortness of breath.     diltiazem (CARDIZEM CD) 120 MG 24 hr capsule Take 1 capsule (120 mg total) by mouth daily. evening time. 30 capsule 3   diphenhydrAMINE (BENADRYL) 25 MG tablet Take 25 mg by mouth daily as needed for allergies.     Docusate Calcium (STOOL SOFTENER PO) Take 1 tablet by mouth daily as needed (constipation).     HYDROcodone-acetaminophen (NORCO/VICODIN) 5-325 MG tablet Take 1 tablet by mouth every 8 (eight) hours as needed.     metoprolol succinate (TOPROL-XL) 100 MG 24 hr tablet Take 1 tablet (100 mg total) by mouth daily. Take with or immediately following a meal. 90 tablet 3   omeprazole (PRILOSEC) 20 MG capsule Take by mouth.  rivaroxaban (XARELTO) 20 MG TABS tablet Take 1 tablet (20 mg total) by mouth daily with supper. 30 tablet 3   rosuvastatin (CRESTOR) 5 MG tablet TAKE ONE TABLET BY MOUTH DAILY 90 tablet 1   No current facility-administered medications for this visit.     Family History    Family History  Problem Relation Age of Onset   Coronary artery disease Mother    Coronary artery disease Father    Cancer Sister        lung cancer, two sisters   Lung cancer Sister    Coronary artery disease Brother    Colon cancer Brother        less than age 69   Cancer Brother    Emphysema Brother    Diabetes Son    Healthy Son    Healthy Son    Healthy Son    Healthy Son    She indicated that her mother is deceased. She  indicated that her father is deceased. She indicated that only one of her two sisters is alive. She indicated that all of her four brothers are deceased. She indicated that her maternal grandmother is deceased. She indicated that her maternal grandfather is deceased. She indicated that her paternal grandmother is deceased. She indicated that her paternal grandfather is deceased. She indicated that all of her four sons are alive.  Social History    Social History   Socioeconomic History   Marital status: Widowed    Spouse name: [name]   Number of children: 4   Years of education: 12th   Highest education level: Not on file  Occupational History    Employer: RETIRED  Tobacco Use   Smoking status: Former    Packs/day: 1.50    Years: 30.00    Pack years: 45.00    Types: Cigarettes    Quit date: 04/28/2000    Years since quitting: 21.0   Smokeless tobacco: Never  Vaping Use   Vaping Use: Never used  Substance and Sexual Activity   Alcohol use: No   Drug use: No   Sexual activity: Not on file    Comment: hyst  Other Topics Concern   Not on file  Social History Narrative   Patient lives at home with spouse.   Caffeine Use: 2 cups of coffee daily   Social Determinants of Health   Financial Resource Strain: Not on file  Food Insecurity: Not on file  Transportation Needs: Not on file  Physical Activity: Not on file  Stress: Not on file  Social Connections: Not on file  Intimate Partner Violence: Not on file     Review of Systems    General:  No chills, fever, night sweats or weight changes.  Cardiovascular:  +++ Occasional episode of fleeting chest tightness, +++ ongoing dyspnea on exertion with minimal activity since hospitalization, no edema, orthopnea, palpitations, paroxysmal nocturnal dyspnea. Dermatological: No rash, lesions/masses Respiratory: No cough,  +++ dyspnea Urologic: No hematuria, dysuria Abdominal:   Has had some GI upset/GERD symptoms.  No nausea,  vomiting, diarrhea, bright red blood per rectum, melena, or hematemesis Neurologic:  No visual changes, wkns, changes in mental status. All other systems reviewed and are otherwise negative except as noted above.     Physical Exam    VS:  BP (!) 160/78    Pulse (!) 125    Ht 5' 5.5" (1.664 m)    Wt 161 lb (73 kg)    SpO2 95%    BMI  26.38 kg/m  , BMI Body mass index is 26.38 kg/m.     GEN: Well nourished, well developed, in no acute distress. HEENT: normal. Neck: Supple, no JVD, carotid bruits, or masses. Cardiac: Irregularly irregular, 1/6 systolic murmur throughout,, no rubs or gallops. No clubbing, cyanosis, edema.  Radials/PT 2+ and equal bilaterally.  Respiratory:  Respirations regular and unlabored, clear to auscultation bilaterally. GI: Soft, nontender, nondistended, BS + x 4. MS: no deformity or atrophy. Skin: warm and dry, no rash. Neuro:  Strength and sensation are intact. Psych: Normal affect.  Accessory Clinical Findings    ECG personally reviewed by me today-atrial fibrillation, 125, rightward axis, poor R wave progression, nonspecific ST and T abnormalities- No acute changes  Lab Results  Component Value Date   WBC 10.0 04/24/2021   HGB 11.8 (L) 04/24/2021   HCT 35.1 (L) 04/24/2021   MCV 95.9 04/24/2021   PLT 324 04/24/2021   Lab Results  Component Value Date   CREATININE 1.07 (H) 04/24/2021   BUN 27 (H) 04/24/2021   NA 138 04/24/2021   K 4.3 04/24/2021   CL 105 04/24/2021   CO2 26 04/24/2021   Lab Results  Component Value Date   ALT 10 01/11/2021   AST 24 01/11/2021   ALKPHOS 79 08/25/2014   BILITOT 0.5 01/11/2021     Lab Results  Component Value Date   HGBA1C 6.1 (H) 08/19/2013    Assessment & Plan   1.  Persistent atrial fibrillation with rapid ventricular response: Patient recently hospitalized with chest pain, dyspnea, moderate troponin elevation, and A. fib with RVR.  She has been taking diltiazem and metoprolol at home and has continued  to have dyspnea on exertion.  She occasionally notes chest tightness.  Heart rate today is 125 and she is in no acute distress.  I am going to increase her metoprolol to 100 mg daily.  Continue current dose of diltiazem and Xarelto.  She has not missed any Xarelto.  We discussed the low risk nature of direct current cardioversion today with risks including VF due to general anesthesia or lack of synchronization between the DC shock and the QRS complex, thromboembolus due to insufficient anticoagulant therapy, non-sustained VT, atrial arrhythmia, heart block, bradycardia, transient left bundle branch block, myocardial necrosis, myocardial dysfunction, transient hypotension, pulmonary edema, and skin burn and she is willing to proceed.   2.  Demand ischemia/nonobstructive CAD: As above, in the setting of rapid atrial fibrillation, she had an elevated troponin in late November to a peak of 361.  Echo showed normal LV function without regional wall motion abnormalities and elevated troponin felt to be most consistent with demand ischemia.  As noted above, she has continued to have dyspnea on exertion in the setting of rapid A. fib, and occasional chest tightness.  Diagnostic catheterization performed in September 2021 showed moderate, nonobstructive disease.  If she continues to have symptoms following cardioversion, would have a low threshold to pursue repeat ischemic evaluation-stress testing versus coronary CT angiography.  She has been on aspirin, which I have advised she may discontinue as she has been having some GI upset recently and is also on Eliquis.  Continue beta-blocker and statin therapy.  Follow-up CBC and basic metabolic panel today.  3.  Essential hypertension: Blood pressure elevated today.  Increasing metoprolol as outlined above.  4.  Hyperlipidemia: No recent lipids in our system.  She is on rosuvastatin.  5.  Disposition: Follow-up CBC and basic metabolic panel today.  Plan for  cardioversion next week.  Murray Hodgkins, NP 04/24/2021, 5:52 PM

## 2021-04-24 NOTE — Patient Instructions (Addendum)
Medication Instructions:   INCREASE Toprol to 100 mg daily   Labwork:  BMET,CBC today  Testing/Procedures: Your physician has recommended that you have a Cardioversion (DCCV). Electrical Cardioversion uses a jolt of electricity to your heart either through paddles or wired patches attached to your chest. This is a controlled, usually prescheduled, procedure. Defibrillation is done under light anesthesia in the hospital, and you usually go home the day of the procedure. This is done to get your heart back into a normal rhythm. You are not awake for the procedure. Please see the instruction sheet given to you today.   Follow-Up:  Keep 05/13/21 apt with Dr.McDowell in the [place] office  Any Other Special Instructions Will Be Listed Below (If Applicable).  If you need a refill on your cardiac medications before your next appointment, please call your pharmacy.

## 2021-04-25 NOTE — Patient Instructions (Signed)
[name]  04/25/2021     @PREFPERIOPPHARMACY @   Your procedure is scheduled on  04/30/2021.   Report to [facility] at  1230  P.M.   Call this number if you have problems the morning of surgery:  [phone]   Remember:  Do not eat or drink after midnight.      DO NOT miss any doses of your xarelto before your procedure.       Use your inhaler before you come and bring your rescue inhaler with you.    Take these medicines the morning of surgery with A SIP OF WATER          xarelto, hydrocodone(if needed), prilosec.     Do not wear jewelry, make-up or nail polish.  Do not wear lotions, powders, or perfumes, or deodorant.  Do not shave 48 hours prior to surgery.  Men may shave face and neck.  Do not bring valuables to the hospital.  [facility] is not responsible for any belongings or valuables.  Contacts, dentures or bridgework may not be worn into surgery.  Leave your suitcase in the car.  After surgery it may be brought to your room.  For patients admitted to the hospital, discharge time will be determined by your treatment team.  Patients discharged the day of surgery will not be allowed to drive home and must have someone with them for 24 hours.    Special instructions:   DO NOT smoke tobacco or vape for 24 hours before your procedure.  Please read over the following fact sheets that you were given. Anesthesia Post-op Instructions and Care and Recovery After Surgery      Electrical Cardioversion Electrical cardioversion is the delivery of a jolt of electricity to restore a normal rhythm to the heart. A rhythm that is too fast or is not regular keeps the heart from pumping well. In this procedure, sticky patches or metal paddles are placed on the chest to deliver electricity to the heart from a device. This procedure may be done in an emergency if: There is low or no blood pressure as a result of the heart rhythm. Normal rhythm must be restored as  fast as possible to protect the brain and heart from further damage. It may save a life. This may also be a scheduled procedure for irregular or fast heart rhythms that are not immediately life-threatening. Tell a health care provider about: Any allergies you have. All medicines you are taking, including vitamins, herbs, eye drops, creams, and over-the-counter medicines. Any problems you or family members have had with anesthetic medicines. Any blood disorders you have. Any surgeries you have had. Any medical conditions you have. Whether you are pregnant or may be pregnant. What are the risks? Generally, this is a safe procedure. However, problems may occur, including: Allergic reactions to medicines. A blood clot that breaks free and travels to other parts of your body. The possible return of an abnormal heart rhythm within hours or days after the procedure. Your heart stopping (cardiac arrest). This is rare. What happens before the procedure? Medicines Your health care provider may have you start taking: Blood-thinning medicines (anticoagulants) so your blood does not clot as easily. Medicines to help stabilize your heart rate and rhythm. Ask your health care provider about: Changing or stopping your regular medicines. This is especially important if you are taking diabetes medicines or blood thinners. Taking medicines such as aspirin and ibuprofen. These medicines  can thin your blood. Do not take these medicines unless your health care provider tells you to take them. Taking over-the-counter medicines, vitamins, herbs, and supplements. General instructions Follow instructions from your health care provider about eating or drinking restrictions. Plan to have someone take you home from the hospital or clinic. If you will be going home right after the procedure, plan to have someone with you for 24 hours. Ask your health care provider what steps will be taken to help prevent infection.  These may include washing your skin with a germ-killing soap. What happens during the procedure?  An IV will be inserted into one of your veins. Sticky patches (electrodes) or metal paddles may be placed on your chest. You will be given a medicine to help you relax (sedative). An electrical shock will be delivered. The procedure may vary among health care providers and hospitals. What can I expect after the procedure? Your blood pressure, heart rate, breathing rate, and blood oxygen level will be monitored until you leave the hospital or clinic. Your heart rhythm will be watched to make sure it does not change. You may have some redness on the skin where the shocks were given. Follow these instructions at home: Do not drive for 24 hours if you were given a sedative during your procedure. Take over-the-counter and prescription medicines only as told by your health care provider. Ask your health care provider how to check your pulse. Check it often. Rest for 48 hours after the procedure or as told by your health care provider. Avoid or limit your caffeine use as told by your health care provider. Keep all follow-up visits as told by your health care provider. This is important. Contact a health care provider if: You feel like your heart is beating too quickly or your pulse is not regular. You have a serious muscle cramp that does not go away. Get help right away if: You have discomfort in your chest. You are dizzy or you feel faint. You have trouble breathing or you are short of breath. Your speech is slurred. You have trouble moving an arm or leg on one side of your body. Your fingers or toes turn cold or blue. Summary Electrical cardioversion is the delivery of a jolt of electricity to restore a normal rhythm to the heart. This procedure may be done right away in an emergency or may be a scheduled procedure if the condition is not an emergency. Generally, this is a safe  procedure. After the procedure, check your pulse often as told by your health care provider. This information is not intended to replace advice given to you by your health care provider. Make sure you discuss any questions you have with your health care provider. Document Revised: 11/15/2018 Document Reviewed: 11/15/2018 Elsevier Patient Education  [place] After This sheet gives you information about how to care for yourself after your procedure. Your health care provider may also give you more specific instructions. If you have problems or questions, contact your health care provider. What can I expect after the procedure? After the procedure, it is common to have: Tiredness. Forgetfulness about what happened after the procedure. Impaired judgment for important decisions. Nausea or vomiting. Some difficulty with balance. Follow these instructions at home: For the time period you were told by your health care provider:   Rest as needed. Do not participate in activities where you could fall or become injured. Do not drive or use machinery.  Do not drink alcohol. Do not take sleeping pills or medicines that cause drowsiness. Do not make important decisions or sign legal documents. Do not take care of children on your own. Eating and drinking Follow the diet that is recommended by your health care provider. Drink enough fluid to keep your urine pale yellow. If you vomit: Drink water, juice, or soup when you can drink without vomiting. Make sure you have little or no nausea before eating solid foods. General instructions Have a responsible adult stay with you for the time you are told. It is important to have someone help care for you until you are awake and alert. Take over-the-counter and prescription medicines only as told by your health care provider. If you have sleep apnea, surgery and certain medicines can increase your risk for breathing  problems. Follow instructions from your health care provider about wearing your sleep device: Anytime you are sleeping, including during daytime naps. While taking prescription pain medicines, sleeping medicines, or medicines that make you drowsy. Avoid smoking. Keep all follow-up visits as told by your health care provider. This is important. Contact a health care provider if: You keep feeling nauseous or you keep vomiting. You feel light-headed. You are still sleepy or having trouble with balance after 24 hours. You develop a rash. You have a fever. You have redness or swelling around the IV site. Get help right away if: You have trouble breathing. You have new-onset confusion at home. Summary For several hours after your procedure, you may feel tired. You may also be forgetful and have poor judgment. Have a responsible adult stay with you for the time you are told. It is important to have someone help care for you until you are awake and alert. Rest as told. Do not drive or operate machinery. Do not drink alcohol or take sleeping pills. Get help right away if you have trouble breathing, or if you suddenly become confused. This information is not intended to replace advice given to you by your health care provider. Make sure you discuss any questions you have with your health care provider. Document Revised: 12/29/2019 Document Reviewed: 03/17/2019 Elsevier Patient Education  2022 [employer].

## 2021-04-26 ENCOUNTER — Other Ambulatory Visit: Payer: Self-pay

## 2021-04-26 ENCOUNTER — Encounter (HOSPITAL_COMMUNITY)
Admission: RE | Admit: 2021-04-26 | Discharge: 2021-04-26 | Disposition: A | Discharge status: Home / Self Care | Payer: Medicare Other | Source: Ambulatory Visit | Attending: Cardiology | Admitting: Cardiology

## 2021-04-26 ENCOUNTER — Encounter (HOSPITAL_COMMUNITY): Payer: Self-pay

## 2021-04-30 ENCOUNTER — Ambulatory Visit (HOSPITAL_COMMUNITY): Payer: Medicare Other | Admitting: Anesthesiology

## 2021-04-30 ENCOUNTER — Ambulatory Visit (HOSPITAL_COMMUNITY)
Admission: RE | Admit: 2021-04-30 | Discharge: 2021-04-30 | Disposition: A | Discharge status: Home / Self Care | Payer: Medicare Other | Source: Ambulatory Visit | Attending: Cardiology | Admitting: Cardiology

## 2021-04-30 ENCOUNTER — Encounter (HOSPITAL_COMMUNITY): Payer: Self-pay | Admitting: Cardiology

## 2021-04-30 ENCOUNTER — Encounter (HOSPITAL_COMMUNITY)
Admission: RE | Disposition: A | Discharge status: Home / Self Care | Payer: Self-pay | Source: Ambulatory Visit | Attending: Cardiology

## 2021-04-30 DIAGNOSIS — F419 Anxiety disorder, unspecified: Secondary | ICD-10-CM | POA: Diagnosis not present

## 2021-04-30 DIAGNOSIS — I498 Other specified cardiac arrhythmias: Secondary | ICD-10-CM | POA: Insufficient documentation

## 2021-04-30 DIAGNOSIS — Z79899 Other long term (current) drug therapy: Secondary | ICD-10-CM | POA: Diagnosis not present

## 2021-04-30 DIAGNOSIS — I2510 Atherosclerotic heart disease of native coronary artery without angina pectoris: Secondary | ICD-10-CM | POA: Diagnosis not present

## 2021-04-30 DIAGNOSIS — I4819 Other persistent atrial fibrillation: Secondary | ICD-10-CM | POA: Insufficient documentation

## 2021-04-30 DIAGNOSIS — E785 Hyperlipidemia, unspecified: Secondary | ICD-10-CM | POA: Insufficient documentation

## 2021-04-30 DIAGNOSIS — Z8249 Family history of ischemic heart disease and other diseases of the circulatory system: Secondary | ICD-10-CM | POA: Insufficient documentation

## 2021-04-30 DIAGNOSIS — J45909 Unspecified asthma, uncomplicated: Secondary | ICD-10-CM | POA: Diagnosis not present

## 2021-04-30 DIAGNOSIS — K219 Gastro-esophageal reflux disease without esophagitis: Secondary | ICD-10-CM | POA: Diagnosis not present

## 2021-04-30 DIAGNOSIS — Z87891 Personal history of nicotine dependence: Secondary | ICD-10-CM | POA: Insufficient documentation

## 2021-04-30 DIAGNOSIS — Z853 Personal history of malignant neoplasm of breast: Secondary | ICD-10-CM | POA: Diagnosis not present

## 2021-04-30 DIAGNOSIS — Z7901 Long term (current) use of anticoagulants: Secondary | ICD-10-CM | POA: Insufficient documentation

## 2021-04-30 DIAGNOSIS — I10 Essential (primary) hypertension: Secondary | ICD-10-CM | POA: Insufficient documentation

## 2021-04-30 DIAGNOSIS — I4891 Unspecified atrial fibrillation: Secondary | ICD-10-CM

## 2021-04-30 HISTORY — PX: CARDIOVERSION: SHX1299

## 2021-04-30 SURGERY — CARDIOVERSION
Anesthesia: General

## 2021-04-30 MED ORDER — PROPOFOL 10 MG/ML IV BOLUS
INTRAVENOUS | Status: AC
  Filled 2021-04-30: qty 20

## 2021-04-30 MED ORDER — LIDOCAINE HCL (PF) 2 % IJ SOLN
INTRAMUSCULAR | Status: AC
  Filled 2021-04-30: qty 5

## 2021-04-30 MED ORDER — ORAL CARE MOUTH RINSE: 15.0000 mL | Freq: Once | OROMUCOSAL | Status: AC

## 2021-04-30 MED ORDER — SODIUM CHLORIDE 0.9 % IV SOLN: INTRAVENOUS | Status: DC

## 2021-04-30 MED ORDER — LIDOCAINE 2% (20 MG/ML) 5 ML SYRINGE
INTRAMUSCULAR | Status: DC | PRN
  Administered 2021-04-30: 80 mg via INTRAVENOUS

## 2021-04-30 MED ORDER — PROPOFOL 10 MG/ML IV BOLUS
INTRAVENOUS | Status: DC | PRN
  Administered 2021-04-30: 80 mg via INTRAVENOUS

## 2021-04-30 MED ORDER — CHLORHEXIDINE GLUCONATE 0.12 % MT SOLN
15.0000 mL | Freq: Once | OROMUCOSAL | Status: AC
  Administered 2021-04-30: 15 mL via OROMUCOSAL

## 2021-04-30 MED ORDER — LACTATED RINGERS IV SOLN: INTRAVENOUS | Status: DC

## 2021-04-30 MED ORDER — CHLORHEXIDINE GLUCONATE 0.12 % MT SOLN
OROMUCOSAL | Status: AC
  Filled 2021-04-30: qty 15

## 2021-04-30 NOTE — H&P (Signed)
Procedure H&P  Patient presents for outpatient electrical cardioversion in setting of persistent atrial fibrillation. For full medical history please refer to referenced recent cardiolgy clinic note below. She is on toprol 100mg  daily, diltaizem 120mg  daily and xarelto 20mg  daily at home for her afib. Plan for electrical cardioversion today with assistance from anesthesiology   Carlyle Dolly MD    Cardiology Clinic Note    Patient Name: [name] Date of Encounter: 04/24/2021   Primary Care Provider:  Pablo Lawrence, NP Primary Cardiologist:  Rozann Lesches, MD   Patient Profile    81 year old female with a history of nonobstructive CAD, hypertension, hyperlipidemia, breast cancer status post right mastectomy, arthritis, and GERD, who presents for follow-up due to persistent atrial fibrillation.   Past Medical History        Past Medical History:  Diagnosis Date   Anemia     Arthritis     Breast cancer ([place])      Right mastectomy   Chronic back pain     Coronary atherosclerosis of native coronary artery      a. 12/2019 Cath: LM nl, LAD 68m, LCX 50p, 80d, RCA 20p, 20m-->med rx.   Demand ischemia ([place])      a. 02/2021 HsTrop to 361 in setting of rapid afib; b. 02/2021 Echo: EF 65-70%, no rwma, mild LVH, RVSP 28.67mmHg. Mildly to mod dil LA. Triv MR. Ao sclerosis w/o stenosis.   Environmental allergies     Essential hypertension     GERD (gastroesophageal reflux disease)     History of bronchitis     History of colon polyps     History of diverticulosis     History of migraine     Panic attacks     Persistent atrial fibrillation ([place]) 02/2021    a. CHA2DS2VASc = 5-->xarelto.   Prolapse of vaginal vault after hysterectomy     Seasonal allergies     Urinary frequency           Past Surgical History:  Procedure Laterality Date   ABDOMINAL HYSTERECTOMY       BACK SURGERY   1981/2011/2014   BLADDER SUSPENSION       BREAST RECONSTRUCTION   2004    with abd  tissue   BREAST SURGERY Right     CHOLECYSTECTOMY       COLONOSCOPY   12/2009    RMR: few pancolonic diverticula. next TCS 12/2014   COLONOSCOPY   06/14/2007    YBO:[id number] rectal polyp, status post cold biopsy removal/ Anal canal hemorrhoids/Left-sided diverticula Colonic mucosa appeared normal.Hyperplastic polyp.    COLONOSCOPY   01/18/2004    DPO:EUMPNTI diverticula/Internal hemorrhoids.  Otherwise normal rectum   COLONOSCOPY N/A 04/18/2013    Dr. Gala Romney- normal rectum, scattered left sided diverticula the remainder of the colonic mucosa appeared normal   COLONOSCOPY N/A 08/18/2014    Procedure: COLONOSCOPY;  Surgeon: Daneil Dolin, MD;  [facility];  Service: Endoscopy;  Laterality: N/A;  1000   ESOPHAGOGASTRODUODENOSCOPY   06/14/2007    RWE:[id number] plaques in esophageal mucosa of uncertain significance brushed and biopsied/Normal stomach, normal first duodenum and second duodenoscopy. KOH negative. esophageal bx c/w GERD   ESOPHAGOGASTRODUODENOSCOPY N/A 08/18/2014    Procedure: ESOPHAGOGASTRODUODENOSCOPY (EGD);  Surgeon: Daneil Dolin, MD;  [facility];  Service: Endoscopy;  Laterality: N/A;   ESOPHAGOGASTRODUODENOSCOPY (EGD) WITH ESOPHAGEAL DILATION N/A 04/18/2013    Dr. Gala Romney- normal, patent appearing, tubular esophagus, normal gastric mucosa, paptent pylorus, normal first  and second portion of the duodenum   EYE SURGERY        lasik   FOOT SURGERY Right 2014    d/t plantar fascitis   GIVENS CAPSULE STUDY N/A 07/18/2013    L.[facility] PAC- markedly abnormal appearing gastric mucosa, sugnificant bile reflux, questionable subucosal small bowel mass versus extrinsic compression in the distal small bowel.- CTE= no small bowel mass or tumor seen.   LEFT HEART CATH AND CORONARY ANGIOGRAPHY N/A 01/16/2020    Procedure: LEFT HEART CATH AND CORONARY ANGIOGRAPHY;  Surgeon: Burnell Blanks, MD;  Location: [facility];  Service: Cardiovascular;   Laterality: N/A;   MALONEY DILATION N/A 08/18/2014    Procedure: Venia Minks DILATION;  Surgeon: Daneil Dolin, MD;  [facility];  Service: Endoscopy;  Laterality: N/A;   MASTECTOMY        right side 12-13 years ago   MOUTH SURGERY        cyst removed in June 2022.   TOTAL HIP ARTHROPLASTY Left 02/29/2016   TOTAL HIP ARTHROPLASTY Left 02/29/2016    Procedure: TOTAL HIP ARTHROPLASTY ANTERIOR APPROACH;  Surgeon: Frederik Pear, MD;  Location: [facility];  Service: Orthopedics;  Laterality: Left;   TOTAL HIP ARTHROPLASTY Right 01/05/2017    Procedure: TOTAL HIP ARTHROPLASTY ANTERIOR APPROACH;  Surgeon: Frederik Pear, MD;  Location: [facility];  Service: Orthopedics;  Laterality: Right;  REQUEST 90 MINS      Allergies        Allergies  Allergen Reactions   Penicillins Anaphylaxis, Swelling and Other (See Comments)      Blisters Has patient had a PCN reaction causing immediate rash, facial/tongue/throat swelling, SOB or lightheadedness with hypotension: Yes Has patient had a PCN reaction causing severe rash involving mucus membranes or skin necrosis: No Has patient had a PCN reaction that required hospitalization No Has patient had a PCN reaction occurring within the last 10 years: No If all of the above answers are "NO", then may proceed with Cephalosporin use.     Apixaban Hives      History of Present Illness    81 year old female with the above past medical history including nonobstructive CAD by diagnostic catheterization in September 2021, hypertension, hyperlipidemia, breast cancer status post right mastectomy, arthritis, and GERD.  She presented to the [facility] emergency department on November 30 with complaints of chest pain and shortness of breath x2 weeks.  She was found to be in atrial fibrillation with rapid ventricular response with placed on IV heparin and Cardizem with improved rates and symptoms.  Troponin was elevated at 361 with a relatively flat trend and this was felt to  represent demand ischemia in the setting of rapid heart rates.  Echocardiogram showed an EF of 65 to 70% without regional wall motion abnormalities or significant valvular disease.  Diltiazem was transitioned to oral diltiazem as well as metoprolol 50 mg daily.  She was placed on Xarelto therapy (previously developed hives on Eliquis), and discharged with plan for outpatient follow-up and eventual cardioversion if necessary.   Since discharge, patient notes that she has continued to experience dyspnea on exertion with minimal activity.  She has had a few episodes of tightness in her chest.  She does not necessarily notice palpitations.  She denies PND, orthopnea, dizziness, syncope, edema, or early satiety.  She reports compliance with her medications, in particular Xarelto, and is interested in pursuing cardioversion.   Home Medications          Current  Outpatient Medications  Medication Sig Dispense Refill   albuterol (VENTOLIN HFA) 108 (90 Base) MCG/ACT inhaler Inhale 2 puffs into the lungs every 6 (six) hours as needed for shortness of breath.       diltiazem (CARDIZEM CD) 120 MG 24 hr capsule Take 1 capsule (120 mg total) by mouth daily. evening time. 30 capsule 3   diphenhydrAMINE (BENADRYL) 25 MG tablet Take 25 mg by mouth daily as needed for allergies.       Docusate Calcium (STOOL SOFTENER PO) Take 1 tablet by mouth daily as needed (constipation).       HYDROcodone-acetaminophen (NORCO/VICODIN) 5-325 MG tablet Take 1 tablet by mouth every 8 (eight) hours as needed.       metoprolol succinate (TOPROL-XL) 100 MG 24 hr tablet Take 1 tablet (100 mg total) by mouth daily. Take with or immediately following a meal. 90 tablet 3   omeprazole (PRILOSEC) 20 MG capsule Take by mouth.       rivaroxaban (XARELTO) 20 MG TABS tablet Take 1 tablet (20 mg total) by mouth daily with supper. 30 tablet 3   rosuvastatin (CRESTOR) 5 MG tablet TAKE ONE TABLET BY MOUTH DAILY 90 tablet 1    No current  facility-administered medications for this visit.      Family History         Family History  Problem Relation Age of Onset   Coronary artery disease Mother     Coronary artery disease Father     Cancer Sister          lung cancer, two sisters   Lung cancer Sister     Coronary artery disease Brother     Colon cancer Brother          less than age 53   Cancer Brother     Emphysema Brother     Diabetes Son     Healthy Son     Healthy Son     Healthy Son     Healthy Son      She indicated that her mother is deceased. She indicated that her father is deceased. She indicated that only one of her two sisters is alive. She indicated that all of her four brothers are deceased. She indicated that her maternal grandmother is deceased. She indicated that her maternal grandfather is deceased. She indicated that her paternal grandmother is deceased. She indicated that her paternal grandfather is deceased. She indicated that all of her four sons are alive.   Social History    Social History         Socioeconomic History   Marital status: Widowed      Spouse name: [name]   Number of children: 4   Years of education: 12th   Highest education level: Not on file  Occupational History      Employer: RETIRED  Tobacco Use   Smoking status: Former      Packs/day: 1.50      Years: 30.00      Pack years: 45.00      Types: Cigarettes      Quit date: 04/28/2000      Years since quitting: 21.0   Smokeless tobacco: Never  Vaping Use   Vaping Use: Never used  Substance and Sexual Activity   Alcohol use: No   Drug use: No   Sexual activity: Not on file      Comment: hyst  Other Topics Concern   Not on file  Social History Narrative  Patient lives at home with spouse.    Caffeine Use: 2 cups of coffee daily    Social Determinants of Health    Financial Resource Strain: Not on file  Food Insecurity: Not on file  Transportation Needs: Not on file  Physical Activity: Not on file   Stress: Not on file  Social Connections: Not on file  Intimate Partner Violence: Not on file      Review of Systems    General:  No chills, fever, night sweats or weight changes.  Cardiovascular:  +++ Occasional episode of fleeting chest tightness, +++ ongoing dyspnea on exertion with minimal activity since hospitalization, no edema, orthopnea, palpitations, paroxysmal nocturnal dyspnea. Dermatological: No rash, lesions/masses Respiratory: No cough,  +++ dyspnea Urologic: No hematuria, dysuria Abdominal:   Has had some GI upset/GERD symptoms.  No nausea, vomiting, diarrhea, bright red blood per rectum, melena, or hematemesis Neurologic:  No visual changes, wkns, changes in mental status. All other systems reviewed and are otherwise negative except as noted above.     Physical Exam    VS:  BP (!) 160/78    Pulse (!) 125    Ht 5' 5.5" (1.664 m)    Wt 161 lb (73 kg)    SpO2 95%    BMI 26.38 kg/m  , BMI Body mass index is 26.38 kg/m.     GEN: Well nourished, well developed, in no acute distress. HEENT: normal. Neck: Supple, no JVD, carotid bruits, or masses. Cardiac: Irregularly irregular, 1/6 systolic murmur throughout,, no rubs or gallops. No clubbing, cyanosis, edema.  Radials/PT 2+ and equal bilaterally.  Respiratory:  Respirations regular and unlabored, clear to auscultation bilaterally. GI: Soft, nontender, nondistended, BS + x 4. MS: no deformity or atrophy. Skin: warm and dry, no rash. Neuro:  Strength and sensation are intact. Psych: Normal affect.   Accessory Clinical Findings    ECG personally reviewed by me today-atrial fibrillation, 125, rightward axis, poor R wave progression, nonspecific ST and T abnormalities- No acute changes   Recent Labs       Lab Results  Component Value Date    WBC 10.0 04/24/2021    HGB 11.8 (L) 04/24/2021    HCT 35.1 (L) 04/24/2021    MCV 95.9 04/24/2021    PLT 324 04/24/2021      Recent Labs       Lab Results  Component Value  Date    CREATININE 1.07 (H) 04/24/2021    BUN 27 (H) 04/24/2021    NA 138 04/24/2021    K 4.3 04/24/2021    CL 105 04/24/2021    CO2 26 04/24/2021      Recent Labs       Lab Results  Component Value Date    ALT 10 01/11/2021    AST 24 01/11/2021    ALKPHOS 79 08/25/2014    BILITOT 0.5 01/11/2021        Recent Labs       Lab Results  Component Value Date    HGBA1C 6.1 (H) 08/19/2013        Assessment & Plan    1.  Persistent atrial fibrillation with rapid ventricular response: Patient recently hospitalized with chest pain, dyspnea, moderate troponin elevation, and A. fib with RVR.  She has been taking diltiazem and metoprolol at home and has continued to have dyspnea on exertion.  She occasionally notes chest tightness.  Heart rate today is 125 and she is in no acute distress.  I am going  to increase her metoprolol to 100 mg daily.  Continue current dose of diltiazem and Xarelto.  She has not missed any Xarelto.  We discussed the low risk nature of direct current cardioversion today with risks including VF due to general anesthesia or lack of synchronization between the DC shock and the QRS complex, thromboembolus due to insufficient anticoagulant therapy, non-sustained VT, atrial arrhythmia, heart block, bradycardia, transient left bundle Dakisha Schoof block, myocardial necrosis, myocardial dysfunction, transient hypotension, pulmonary edema, and skin burn and she is willing to proceed.    2.  Demand ischemia/nonobstructive CAD: As above, in the setting of rapid atrial fibrillation, she had an elevated troponin in late November to a peak of 361.  Echo showed normal LV function without regional wall motion abnormalities and elevated troponin felt to be most consistent with demand ischemia.  As noted above, she has continued to have dyspnea on exertion in the setting of rapid A. fib, and occasional chest tightness.  Diagnostic catheterization performed in September 2021 showed moderate,  nonobstructive disease.  If she continues to have symptoms following cardioversion, would have a low threshold to pursue repeat ischemic evaluation-stress testing versus coronary CT angiography.  She has been on aspirin, which I have advised she may discontinue as she has been having some GI upset recently and is also on Eliquis.  Continue beta-blocker and statin therapy.  Follow-up CBC and basic metabolic panel today.   3.  Essential hypertension: Blood pressure elevated today.  Increasing metoprolol as outlined above.  4.  Hyperlipidemia: No recent lipids in our system.  She is on rosuvastatin.  5.  Disposition: Follow-up CBC and basic metabolic panel today.  Plan for cardioversion next week.   Murray Hodgkins, NP 04/24/2021, 5:52 PM

## 2021-04-30 NOTE — Transfer of Care (Signed)
Immediate Anesthesia Transfer of Care Note  Patient: [name]  Procedure(s) Performed: CARDIOVERSION  Patient Location: PACU  Anesthesia Type:General  Level of Consciousness: drowsy  Airway & Oxygen Therapy: Patient Spontanous Breathing and Patient connected to nasal cannula oxygen  Post-op Assessment: Report given to RN and Post -op Vital signs reviewed and stable  Post vital signs: Reviewed and stable  Last Vitals:  Vitals Value Taken Time  BP    Temp    Pulse 117 04/30/21 1258  Resp 11 04/30/21 1258  SpO2 95 % 04/30/21 1258  Vitals shown include unvalidated device data.  Last Pain:  Vitals:   04/30/21 1225  TempSrc: Oral  PainSc: 0-No pain         Complications: No notable events documented.

## 2021-04-30 NOTE — Anesthesia Postprocedure Evaluation (Signed)
Anesthesia Post Note  Patient: [name]  Procedure(s) Performed: CARDIOVERSION  Patient location during evaluation: Phase II Anesthesia Type: General Level of consciousness: awake Pain management: pain level controlled Vital Signs Assessment: post-procedure vital signs reviewed and stable Respiratory status: spontaneous breathing and respiratory function stable Cardiovascular status: blood pressure returned to baseline and stable Postop Assessment: no headache and no apparent nausea or vomiting Anesthetic complications: no Comments: Late entry   No notable events documented.   Last Vitals:  Vitals:   04/30/21 1245 04/30/21 1315  BP: (!) 137/109 (!) 103/47  Pulse: 76   Resp: 20 14  Temp:  36.7 C  SpO2: 91% 100%    Last Pain:  Vitals:   04/30/21 1315  TempSrc:   PainSc: 0-No pain                 Louann Sjogren

## 2021-04-30 NOTE — CV Procedure (Addendum)
CV Procedure Note  Procedure: Electrical cardioversion Indication: Persistent atrial fibrillation Physician: Dr Carlyle Dolly MD   Paitient was brought to the procedure suite after appropriate consent was obtained. I independently confirmed with the patient she had not missed any doses of her xarelto within the last 3 weeks. Sedation was achieved with the assistance of anesthesiology, for details please refer to there documentaiton.  Defib pads were placed in the anterior and posterior positions and she was succesfully cardioverted from afib to sinus bradycardia at 50 with a single synchronized 200j shock. She tolerated the procedure well without complications.  Addendum 205pm 1 hour into recovery hearts in low 50s, we will discontinue her diltiazem and continue her toprol at 100mg  daily.   Carlyle Dolly MD

## 2021-04-30 NOTE — Anesthesia Preprocedure Evaluation (Signed)
Anesthesia Evaluation  Patient identified by MRN, date of birth, ID band Patient awake    Reviewed: Allergy & Precautions, H&P , NPO status , Patient's Chart, lab work & pertinent test results, reviewed documented beta blocker date and time   Airway Mallampati: II  TM Distance: >3 FB Neck ROM: full    Dental no notable dental hx.    Pulmonary shortness of breath, asthma , former smoker,    Pulmonary exam normal breath sounds clear to auscultation       Cardiovascular Exercise Tolerance: Good hypertension, + CAD   Rhythm:regular Rate:Normal     Neuro/Psych Anxiety negative neurological ROS  negative psych ROS   GI/Hepatic Neg liver ROS, GERD  Medicated,  Endo/Other  negative endocrine ROS  Renal/GU negative Renal ROS  negative genitourinary   Musculoskeletal   Abdominal   Peds  Hematology  (+) Blood dyscrasia, anemia ,   Anesthesia Other Findings   Reproductive/Obstetrics negative OB ROS                             Anesthesia Physical Anesthesia Plan  ASA: 2  Anesthesia Plan: General   Post-op Pain Management:    Induction:   PONV Risk Score and Plan: Propofol infusion  Airway Management Planned:   Additional Equipment:   Intra-op Plan:   Post-operative Plan:   Informed Consent: I have reviewed the patients History and Physical, chart, labs and discussed the procedure including the risks, benefits and alternatives for the proposed anesthesia with the patient or authorized representative who has indicated his/her understanding and acceptance.     Dental Advisory Given  Plan Discussed with: CRNA  Anesthesia Plan Comments:         Anesthesia Quick Evaluation

## 2021-04-30 NOTE — Anesthesia Procedure Notes (Signed)
Date/Time: 04/30/2021 1:03 PM Performed by: Orlie Dakin, CRNA Pre-anesthesia Checklist: Patient identified, Emergency Drugs available, Suction available and Patient being monitored Patient Re-evaluated:Patient Re-evaluated prior to induction Oxygen Delivery Method: Nasal cannula Induction Type: IV induction Placement Confirmation: positive ETCO2

## 2021-04-30 NOTE — Progress Notes (Signed)
Electrical Cardioversion Procedure Note CHAUNTELLE AZPEITIA [id number] May 30, 1940  Procedure: Electrical Cardioversion Indications:  Atrial Fibrillation  Procedure Details Consent: Risks of procedure as well as the alternatives and risks of each were explained to the (patient/caregiver).  Consent for procedure obtained. Time Out: Verified patient identification, verified procedure, site/side was marked, verified correct patient position, special equipment/implants available, medications/allergies/relevent history reviewed, required imaging and test results available.  Performed  Patient placed on cardiac monitor, pulse oximetry, supplemental oxygen as necessary.  Sedation given:  propofol Pacer pads placed anterior and posterior chest.  Cardioverted 1 time(s).  Cardioverted at [place].  Evaluation Findings: Post procedure EKG shows: NSR Complications: None Patient did tolerate procedure well.   Jarome Lamas Jammy Plotkin 04/30/2021, 1:26 PM

## 2021-05-01 ENCOUNTER — Encounter (HOSPITAL_COMMUNITY): Payer: Self-pay | Admitting: Cardiology

## 2021-05-03 ENCOUNTER — Other Ambulatory Visit (HOSPITAL_COMMUNITY): Payer: Self-pay | Admitting: Adult Health Nurse Practitioner

## 2021-05-03 DIAGNOSIS — Z1231 Encounter for screening mammogram for malignant neoplasm of breast: Secondary | ICD-10-CM

## 2021-05-13 ENCOUNTER — Other Ambulatory Visit: Payer: Self-pay

## 2021-05-13 ENCOUNTER — Telehealth: Payer: Self-pay

## 2021-05-13 ENCOUNTER — Ambulatory Visit: Payer: Medicare Other | Admitting: Cardiology

## 2021-05-13 ENCOUNTER — Other Ambulatory Visit: Payer: Self-pay | Admitting: Cardiology

## 2021-05-13 ENCOUNTER — Telehealth: Payer: Self-pay | Admitting: Cardiology

## 2021-05-13 ENCOUNTER — Encounter: Payer: Self-pay | Admitting: Cardiology

## 2021-05-13 VITALS — BP 142/66 | HR 120 | Ht 65.5 in | Wt 162.0 lb

## 2021-05-13 DIAGNOSIS — I25119 Atherosclerotic heart disease of native coronary artery with unspecified angina pectoris: Secondary | ICD-10-CM | POA: Diagnosis not present

## 2021-05-13 DIAGNOSIS — I4819 Other persistent atrial fibrillation: Secondary | ICD-10-CM

## 2021-05-13 MED ORDER — ALBUTEROL SULFATE HFA 108 (90 BASE) MCG/ACT IN AERS: 2.0000 | INHALATION_SPRAY | Freq: Four times a day (QID) | RESPIRATORY_TRACT | 1 refills | Status: DC | PRN

## 2021-05-13 MED ORDER — MULTAQ 400 MG PO TABS: 400.0000 mg | ORAL_TABLET | Freq: Two times a day (BID) | ORAL | 5 refills | Status: DC

## 2021-05-13 NOTE — Patient Instructions (Signed)
Medication Instructions:  Your physician has recommended you make the following change in your medication:  START Multaq 400mg  twice daily DOP NOT MISS A DOSE  *If you need a refill on your cardiac medications before your next appointment, please call your pharmacy*   Lab Work: BMET If you have labs (blood work) drawn today and your tests are completely normal, you will receive your results only by: [place] (if you have MyChart) OR A paper copy in the mail If you have any lab test that is abnormal or we need to change your treatment, we will call you to review the results.   Testing/Procedures: Cardioversion   Follow-Up: At [facility], you and your health needs are our priority.  As part of our continuing mission to provide you with exceptional heart care, we have created designated Provider Care Teams.  These Care Teams include your primary Cardiologist (physician) and Advanced Practice Providers (APPs -  Physician Assistants and Nurse Practitioners) who all work together to provide you with the care you need, when you need it.  We recommend signing up for the patient portal called "MyChart".  Sign up information is provided on this After Visit Summary.  MyChart is used to connect with patients for Virtual Visits (Telemedicine).  Patients are able to view lab/test results, encounter notes, upcoming appointments, etc.  Non-urgent messages can be sent to your provider as well.   To learn more about what you can do with MyChart, go to [email or web address].    Your next appointment:   Follow Up- PEnding :1}    Other Instructions

## 2021-05-13 NOTE — Addendum Note (Signed)
Addended by: Christella Scheuermann C on: 05/13/2021 02:54 PM   Modules accepted: Orders

## 2021-05-13 NOTE — Progress Notes (Signed)
Cardiology Office Note  Date: 05/13/2021   ID: Melinda, Gould 10/17/1940, MRN [id number]  PCP:  Pablo Lawrence, NP  Cardiologist:  Rozann Lesches, MD Electrophysiologist:  None   Chief Complaint  Patient presents with   Cardiac follow-up    History of Present Illness: [name] is an 81 y.o. female seen recently by Mr. Melinda Douglas NP in December 2022, I reviewed the note.  She was referred at that time for an elective cardioversion due to persistent atrial fibrillation.  Procedure was performed by Dr. Harl Bowie on January 3 with successful restoration of sinus rhythm.  She was taken off Cardizem CD at that point but continued on Toprol-XL along with Xarelto.  She presents today unfortunately back in rapid atrial fibrillation.  States that she felt better for about a day after her cardioversion and then short of breath again with sense of palpitations suggesting early recurrence.  She states that she has been compliant with her medications including Toprol-XL and Xarelto.  Today we discussed options for antiarrhythmic therapy with repeat cardioversion attempt.  I personally reviewed her ECG which shows atrial fibrillation at 120 with rightward axis.  Need to avoid flecainide with ischemic heart disease, could consider amiodarone although she does have history of bronchitis and uses inhaler intermittently.  We decided on trial of Multaq, if this is not effective I can have her be evaluated further in atrial fibrillation clinic for Tikosyn.  Past Medical History:  Diagnosis Date   Anemia    Arthritis    Breast cancer ([place])    Right mastectomy   Chronic back pain    Coronary atherosclerosis of native coronary artery    a. 12/2019 Cath: LM nl, LAD 39m, LCX 50p, 80d, RCA 20p, 29m-->med rx.   Demand ischemia ([place])    a. 02/2021 HsTrop to 361 in setting of rapid afib; b. 02/2021 Echo: EF 65-70%, no rwma, mild LVH, RVSP 28.69mmHg. Mildly to mod dil LA. Triv MR. Ao sclerosis w/o stenosis.    Environmental allergies    Essential hypertension    GERD (gastroesophageal reflux disease)    History of bronchitis    History of colon polyps    History of diverticulosis    History of migraine    Panic attacks    Persistent atrial fibrillation ([place]) 02/2021   a. CHA2DS2VASc = 5-->xarelto.   Prolapse of vaginal vault after hysterectomy    Seasonal allergies    Urinary frequency     Past Surgical History:  Procedure Laterality Date   ABDOMINAL HYSTERECTOMY     BACK SURGERY  1981/2011/2014   BLADDER SUSPENSION     BREAST RECONSTRUCTION  2004   with abd tissue   BREAST SURGERY Right    CARDIOVERSION N/A 04/30/2021   Procedure: CARDIOVERSION;  Surgeon: Arnoldo Lenis, MD;  Location: [facility];  Service: Endoscopy;  Laterality: N/A;   CHOLECYSTECTOMY     COLONOSCOPY  12/2009   RMR: few pancolonic diverticula. next TCS 12/2014   COLONOSCOPY  06/14/2007   SHF:WYOVZCHYIF rectal polyp, status post cold biopsy removal/ Anal canal hemorrhoids/Left-sided diverticula Colonic mucosa appeared normal.Hyperplastic polyp.    COLONOSCOPY  01/18/2004   OYD:XAJOINO diverticula/Internal hemorrhoids.  Otherwise normal rectum   COLONOSCOPY N/A 04/18/2013   Dr. Gala Romney- normal rectum, scattered left sided diverticula the remainder of the colonic mucosa appeared normal   COLONOSCOPY N/A 08/18/2014   Procedure: COLONOSCOPY;  Surgeon: Daneil Dolin, MD;  Location: [facility];  Service: Endoscopy;  Laterality:  N/A;  1000   ESOPHAGOGASTRODUODENOSCOPY  06/14/2007   RJJ:OACZYS-AYTK plaques in esophageal mucosa of uncertain significance brushed and biopsied/Normal stomach, normal first duodenum and second duodenoscopy. KOH negative. esophageal bx c/w GERD   ESOPHAGOGASTRODUODENOSCOPY N/A 08/18/2014   Procedure: ESOPHAGOGASTRODUODENOSCOPY (EGD);  Surgeon: Daneil Dolin, MD;  [facility];  Service: Endoscopy;  Laterality: N/A;   ESOPHAGOGASTRODUODENOSCOPY (EGD) WITH ESOPHAGEAL DILATION N/A  04/18/2013   Dr. Gala Romney- normal, patent appearing, tubular esophagus, normal gastric mucosa, paptent pylorus, normal first and second portion of the duodenum   EYE SURGERY     lasik   FOOT SURGERY Right 2014   d/t plantar fascitis   GIVENS CAPSULE STUDY N/A 07/18/2013   L.[facility] PAC- markedly abnormal appearing gastric mucosa, sugnificant bile reflux, questionable subucosal small bowel mass versus extrinsic compression in the distal small bowel.- CTE= no small bowel mass or tumor seen.   LEFT HEART CATH AND CORONARY ANGIOGRAPHY N/A 01/16/2020   Procedure: LEFT HEART CATH AND CORONARY ANGIOGRAPHY;  Surgeon: Burnell Blanks, MD;  Location: [facility];  Service: Cardiovascular;  Laterality: N/A;   MALONEY DILATION N/A 08/18/2014   Procedure: Venia Minks DILATION;  Surgeon: Daneil Dolin, MD;  [facility];  Service: Endoscopy;  Laterality: N/A;   MASTECTOMY     right side 12-13 years ago   MOUTH SURGERY     cyst removed in June 2022.   TOTAL HIP ARTHROPLASTY Left 02/29/2016   TOTAL HIP ARTHROPLASTY Left 02/29/2016   Procedure: TOTAL HIP ARTHROPLASTY ANTERIOR APPROACH;  Surgeon: Frederik Pear, MD;  Location: [facility];  Service: Orthopedics;  Laterality: Left;   TOTAL HIP ARTHROPLASTY Right 01/05/2017   Procedure: TOTAL HIP ARTHROPLASTY ANTERIOR APPROACH;  Surgeon: Frederik Pear, MD;  Location: [facility];  Service: Orthopedics;  Laterality: Right;  REQUEST 90 MINS    Current Outpatient Medications  Medication Sig Dispense Refill   albuterol (VENTOLIN HFA) 108 (90 Base) MCG/ACT inhaler Inhale 2 puffs into the lungs every 6 (six) hours as needed for shortness of breath or wheezing.     diphenhydrAMINE (BENADRYL) 25 MG tablet Take 25 mg by mouth daily as needed for allergies.     Docusate Calcium (STOOL SOFTENER PO) Take 1 tablet by mouth daily.     dronedarone (MULTAQ) 400 MG tablet Take 1 tablet (400 mg total) by mouth 2 (two) times daily with a meal. 60 tablet 5    HYDROcodone-acetaminophen (NORCO/VICODIN) 5-325 MG tablet Take 1 tablet by mouth every 8 (eight) hours as needed for moderate pain.     metoprolol succinate (TOPROL-XL) 100 MG 24 hr tablet Take 1 tablet (100 mg total) by mouth daily. Take with or immediately following a meal. 90 tablet 3   omeprazole (PRILOSEC) 20 MG capsule Take 20 mg by mouth daily.     rivaroxaban (XARELTO) 20 MG TABS tablet Take 1 tablet (20 mg total) by mouth daily with supper. 30 tablet 3   rosuvastatin (CRESTOR) 5 MG tablet TAKE ONE TABLET BY MOUTH DAILY 90 tablet 1   No current facility-administered medications for this visit.   Allergies:  Penicillins and Apixaban   Social History: The patient  reports that she quit smoking about 21 years ago. Her smoking use included cigarettes. She has a 45.00 pack-year smoking history. She has never used smokeless tobacco. She reports that she does not drink alcohol and does not use drugs.   Family History: The patient's family history includes Cancer in her brother and sister; Colon cancer  in her brother; Coronary artery disease in her brother, father, and mother; Diabetes in her son; Emphysema in her brother; Healthy in her son, son, son, and son; Lung cancer in her sister.   ROS: No orthopnea or PND.  No syncope.  Physical Exam: VS:  BP (!) 142/66    Pulse (!) 120    Ht 5' 5.5" (1.664 m)    Wt 162 lb (73.5 kg)    SpO2 92%    BMI 26.55 kg/m , BMI Body mass index is 26.55 kg/m.  Wt Readings from Last 3 Encounters:  05/13/21 162 lb (73.5 kg)  04/26/21 161 lb (73 kg)  04/24/21 161 lb (73 kg)    General: Patient appears comfortable at rest. HEENT: Conjunctiva and lids normal, wearing a mask. Neck: Supple, no elevated JVP or carotid bruits, no thyromegaly. Lungs: Decreased breath sounds without wheezing, nonlabored breathing at rest. Cardiac: Irregularly irregular, no S3, 1/6 systolic murmur, no pericardial rub. Abdomen: Soft, nontender, bowel sounds present. Extremities: No  pitting edema, distal pulses 2+. Skin: Warm and dry. Musculoskeletal: No kyphosis. Neuropsychiatric: Alert and oriented x3, affect grossly appropriate.  ECG:  An ECG dated 04/30/2021 was personally reviewed today and demonstrated:  Sinus bradycardia.  Recent Labwork: 01/11/2021: ALT 10; AST 24 03/27/2021: Magnesium 2.0 03/28/2021: TSH 3.023 04/24/2021: BUN 27; Creatinine, Ser 1.07; Hemoglobin 11.8; Platelets 324; Potassium 4.3; Sodium 138   Other Studies Reviewed Today:  Echocardiogram 03/28/2021:  1. Left ventricular ejection fraction, by estimation, is 65 to 70%. The  left ventricle has normal function. The left ventricle has no regional  wall motion abnormalities. There is mild left ventricular hypertrophy.  Left ventricular diastolic parameters  are indeterminate.   2. Right ventricular systolic function is normal. The right ventricular  size is normal. There is normal pulmonary artery systolic pressure. The  estimated right ventricular systolic pressure is 53.2 mmHg.   3. Left atrial size was mild to moderately dilated.   4. There is a trivial pericardial effusion posterior to the left  ventricle.   5. The mitral valve is grossly normal. Trivial mitral valve  regurgitation.   6. The aortic valve is tricuspid. Aortic valve regurgitation is not  visualized. Aortic valve sclerosis is present, with no evidence of aortic  valve stenosis. Aortic valve mean gradient measures 3.5 mmHg.   7. The inferior vena cava is normal in size with greater than 50%  respiratory variability, suggesting right atrial pressure of 3 mmHg.   Assessment and Plan:  1.  Recurrent, persistent atrial fibrillation with RVR.  CHA2DS2-VASc score is 5.  She underwent successful cardioversion in early January, however had fairly quick return to atrial fibrillation.  As discussed above, plan is to initiate Multaq for antiarrhythmic therapy and schedule a follow-up cardioversion later this week.  Continue Toprol-XL  and Xarelto.  If this strategy is not effective, she would then need to be referred to the atrial fibrillation clinic for discussion of Tikosyn most likely.  2.  CAD, overall moderate with branch vessel disease being managed medically.  LVEF 65 to 70% by recent evaluation.  No obvious angina at this time.  Continue Crestor.  Medication Adjustments/Labs and Tests Ordered: Current medicines are reviewed at length with the patient today.  Concerns regarding medicines are outlined above.   Tests Ordered: Orders Placed This Encounter  Procedures   Basic metabolic panel   EKG 99-MEQA    Medication Changes: Meds ordered this encounter  Medications   dronedarone (MULTAQ) 400 MG tablet  Sig: Take 1 tablet (400 mg total) by mouth 2 (two) times daily with a meal.    Dispense:  60 tablet    Refill:  5    Disposition:  Follow up  3 to 4 weeks  Signed, Satira Sark, MD, [facility] 05/13/2021 2:49 PM    [facility] [place], [place], [place] [address] Phone: [phone]; Fax: [phone]

## 2021-05-13 NOTE — Telephone Encounter (Signed)
Checking percert on the following patient for testing scheduled at [facility].     DCCV    05/16/2021

## 2021-05-13 NOTE — Telephone Encounter (Signed)
Spoke to [place] who stated that the pt's insurance preferred to cover ProAir Inhaler rather then the Ventolin inhaler. Switch ok'd per Dr. Domenic Polite through secure chat. Medication list updated to reflect changes made.

## 2021-05-14 NOTE — Patient Instructions (Signed)
[name]  05/14/2021     @PREFPERIOPPHARMACY @   Your procedure is scheduled on  05/16/2021.   Report to [facility] at  0830 A.M.   Call this number if you have problems the morning of surgery:  [phone]   Remember:  Do not eat or drink after midnight.      DO NOT miss any doses of xarelto.    Take these medicines the morning of surgery with A SIP OF WATER                         hydrocodone(if needed), prilosec, multaq.     Do not wear jewelry, make-up or nail polish.  Do not wear lotions, powders, or perfumes, or deodorant.  Do not shave 48 hours prior to surgery.  Men may shave face and neck.  Do not bring valuables to the hospital.  [facility] is not responsible for any belongings or valuables.  Contacts, dentures or bridgework may not be worn into surgery.  Leave your suitcase in the car.  After surgery it may be brought to your room.  For patients admitted to the hospital, discharge time will be determined by your treatment team.  Patients discharged the day of surgery will not be allowed to drive home and must have someone with them for 24 hors.    Special instructions:   DO NOT smoke tobacco or vape for 24 hours before your procedure.  Please read over the following fact sheets that you were given. Anesthesia Post-op Instructions and Care and Recovery After Surgery      Electrical Cardioversion Electrical cardioversion is the delivery of a jolt of electricity to restore a normal rhythm to the heart. A rhythm that is too fast or is not regular keeps the heart from pumping well. In this procedure, sticky patches or metal paddles are placed on the chest to deliver electricity to the heart from a device. This procedure may be done in an emergency if: There is low or no blood pressure as a result of the heart rhythm. Normal rhythm must be restored as fast as possible to protect the brain and heart from further damage. It may save a life. This  may also be a scheduled procedure for irregular or fast heart rhythms that are not immediately life-threatening. Tell a health care provider about: Any allergies you have. All medicines you are taking, including vitamins, herbs, eye drops, creams, and over-the-counter medicines. Any problems you or family members have had with anesthetic medicines. Any blood disorders you have. Any surgeries you have had. Any medical conditions you have. Whether you are pregnant or may be pregnant. What are the risks? Generally, this is a safe procedure. However, problems may occur, including: Allergic reactions to medicines. A blood clot that breaks free and travels to other parts of your body. The possible return of an abnormal heart rhythm within hours or days after the procedure. Your heart stopping (cardiac arrest). This is rare. What happens before the procedure? Medicines Your health care provider may have you start taking: Blood-thinning medicines (anticoagulants) so your blood does not clot as easily. Medicines to help stabilize your heart rate and rhythm. Ask your health care provider about: Changing or stopping your regular medicines. This is especially important if you are taking diabetes medicines or blood thinners. Taking medicines such as aspirin and ibuprofen. These medicines can thin your blood. Do not take these medicines  unless your health care provider tells you to take them. Taking over-the-counter medicines, vitamins, herbs, and supplements. General instructions Follow instructions from your health care provider about eating or drinking restrictions. Plan to have someone take you home from the hospital or clinic. If you will be going home right after the procedure, plan to have someone with you for 24 hours. Ask your health care provider what steps will be taken to help prevent infection. These may include washing your skin with a germ-killing soap. What happens during the  procedure?  An IV will be inserted into one of your veins. Sticky patches (electrodes) or metal paddles may be placed on your chest. You will be given a medicine to help you relax (sedative). An electrical shock will be delivered. The procedure may vary among health care providers and hospitals. What can I expect after the procedure? Your blood pressure, heart rate, breathing rate, and blood oxygen level will be monitored until you leave the hospital or clinic. Your heart rhythm will be watched to make sure it does not change. You may have some redness on the skin where the shocks were given. Follow these instructions at home: Do not drive for 24 hours if you were given a sedative during your procedure. Take over-the-counter and prescription medicines only as told by your health care provider. Ask your health care provider how to check your pulse. Check it often. Rest for 48 hours after the procedure or as told by your health care provider. Avoid or limit your caffeine use as told by your health care provider. Keep all follow-up visits as told by your health care provider. This is important. Contact a health care provider if: You feel like your heart is beating too quickly or your pulse is not regular. You have a serious muscle cramp that does not go away. Get help right away if: You have discomfort in your chest. You are dizzy or you feel faint. You have trouble breathing or you are short of breath. Your speech is slurred. You have trouble moving an arm or leg on one side of your body. Your fingers or toes turn cold or blue. Summary Electrical cardioversion is the delivery of a jolt of electricity to restore a normal rhythm to the heart. This procedure may be done right away in an emergency or may be a scheduled procedure if the condition is not an emergency. Generally, this is a safe procedure. After the procedure, check your pulse often as told by your health care provider. This  information is not intended to replace advice given to you by your health care provider. Make sure you discuss any questions you have with your health care provider. Document Revised: 11/15/2018 Document Reviewed: 11/15/2018 Elsevier Patient Education  [place] After This sheet gives you information about how to care for yourself after your procedure. Your health care provider may also give you more specific instructions. If you have problems or questions, contact your health care provider. What can I expect after the procedure? After the procedure, it is common to have: Tiredness. Forgetfulness about what happened after the procedure. Impaired judgment for important decisions. Nausea or vomiting. Some difficulty with balance. Follow these instructions at home: For the time period you were told by your health care provider:   Rest as needed. Do not participate in activities where you could fall or become injured. Do not drive or use machinery. Do not drink alcohol. Do not take sleeping pills  or medicines that cause drowsiness. Do not make important decisions or sign legal documents. Do not take care of children on your own. Eating and drinking Follow the diet that is recommended by your health care provider. Drink enough fluid to keep your urine pale yellow. If you vomit: Drink water, juice, or soup when you can drink without vomiting. Make sure you have little or no nausea before eating solid foods. General instructions Have a responsible adult stay with you for the time you are told. It is important to have someone help care for you until you are awake and alert. Take over-the-counter and prescription medicines only as told by your health care provider. If you have sleep apnea, surgery and certain medicines can increase your risk for breathing problems. Follow instructions from your health care provider about wearing your sleep  device: Anytime you are sleeping, including during daytime naps. While taking prescription pain medicines, sleeping medicines, or medicines that make you drowsy. Avoid smoking. Keep all follow-up visits as told by your health care provider. This is important. Contact a health care provider if: You keep feeling nauseous or you keep vomiting. You feel light-headed. You are still sleepy or having trouble with balance after 24 hours. You develop a rash. You have a fever. You have redness or swelling around the IV site. Get help right away if: You have trouble breathing. You have new-onset confusion at home. Summary For several hours after your procedure, you may feel tired. You may also be forgetful and have poor judgment. Have a responsible adult stay with you for the time you are told. It is important to have someone help care for you until you are awake and alert. Rest as told. Do not drive or operate machinery. Do not drink alcohol or take sleeping pills. Get help right away if you have trouble breathing, or if you suddenly become confused. This information is not intended to replace advice given to you by your health care provider. Make sure you discuss any questions you have with your health care provider. Document Revised: 12/29/2019 Document Reviewed: 03/17/2019 Elsevier Patient Education  2022 [employer].

## 2021-05-15 ENCOUNTER — Ambulatory Visit (HOSPITAL_COMMUNITY): Payer: Medicare Other

## 2021-05-15 ENCOUNTER — Encounter (HOSPITAL_COMMUNITY): Payer: Self-pay

## 2021-05-15 ENCOUNTER — Other Ambulatory Visit: Payer: Self-pay

## 2021-05-15 ENCOUNTER — Encounter (HOSPITAL_COMMUNITY)
Admission: RE | Admit: 2021-05-15 | Discharge: 2021-05-15 | Disposition: A | Discharge status: Home / Self Care | Payer: Medicare Other | Source: Ambulatory Visit | Attending: Cardiology | Admitting: Cardiology

## 2021-05-15 DIAGNOSIS — I4819 Other persistent atrial fibrillation: Secondary | ICD-10-CM | POA: Diagnosis present

## 2021-05-15 LAB — BASIC METABOLIC PANEL
Anion gap: 10 (ref 5–15)
BUN: 31 mg/dL — ABNORMAL HIGH (ref 8–23)
CO2: 21 mmol/L — ABNORMAL LOW (ref 22–32)
Calcium: 9.1 mg/dL (ref 8.9–10.3)
Chloride: 105 mmol/L (ref 98–111)
Creatinine, Ser: 1.09 mg/dL — ABNORMAL HIGH (ref 0.44–1.00)
GFR, Estimated: 51 mL/min — ABNORMAL LOW (ref >60)
Glucose, Bld: 121 mg/dL — ABNORMAL HIGH (ref 70–99)
Potassium: 4 mmol/L (ref 3.5–5.1)
Sodium: 136 mmol/L (ref 135–145)

## 2021-05-16 ENCOUNTER — Encounter (HOSPITAL_COMMUNITY)
Admission: RE | Disposition: A | Discharge status: Home / Self Care | Payer: Self-pay | Source: Ambulatory Visit | Attending: Cardiology

## 2021-05-16 ENCOUNTER — Other Ambulatory Visit: Payer: Self-pay

## 2021-05-16 ENCOUNTER — Ambulatory Visit (HOSPITAL_COMMUNITY)
Admission: RE | Admit: 2021-05-16 | Discharge: 2021-05-16 | Disposition: A | Discharge status: Home / Self Care | Payer: Medicare Other | Source: Ambulatory Visit | Attending: Cardiology | Admitting: Cardiology

## 2021-05-16 ENCOUNTER — Ambulatory Visit (HOSPITAL_COMMUNITY): Payer: Medicare Other | Admitting: Anesthesiology

## 2021-05-16 DIAGNOSIS — Z79899 Other long term (current) drug therapy: Secondary | ICD-10-CM | POA: Diagnosis not present

## 2021-05-16 DIAGNOSIS — Z87891 Personal history of nicotine dependence: Secondary | ICD-10-CM | POA: Insufficient documentation

## 2021-05-16 DIAGNOSIS — J45909 Unspecified asthma, uncomplicated: Secondary | ICD-10-CM | POA: Insufficient documentation

## 2021-05-16 DIAGNOSIS — I4819 Other persistent atrial fibrillation: Secondary | ICD-10-CM

## 2021-05-16 DIAGNOSIS — Z7901 Long term (current) use of anticoagulants: Secondary | ICD-10-CM | POA: Diagnosis not present

## 2021-05-16 DIAGNOSIS — I2510 Atherosclerotic heart disease of native coronary artery without angina pectoris: Secondary | ICD-10-CM | POA: Insufficient documentation

## 2021-05-16 DIAGNOSIS — I10 Essential (primary) hypertension: Secondary | ICD-10-CM | POA: Insufficient documentation

## 2021-05-16 DIAGNOSIS — K219 Gastro-esophageal reflux disease without esophagitis: Secondary | ICD-10-CM | POA: Insufficient documentation

## 2021-05-16 HISTORY — PX: CARDIOVERSION: SHX1299

## 2021-05-16 SURGERY — CARDIOVERSION
Anesthesia: General

## 2021-05-16 MED ORDER — LIDOCAINE 2% (20 MG/ML) 5 ML SYRINGE
INTRAMUSCULAR | Status: DC | PRN
  Administered 2021-05-16: 80 mg via INTRAVENOUS

## 2021-05-16 MED ORDER — PROPOFOL 10 MG/ML IV BOLUS
INTRAVENOUS | Status: DC | PRN
  Administered 2021-05-16: 80 mg via INTRAVENOUS

## 2021-05-16 MED ORDER — PROPOFOL 10 MG/ML IV BOLUS
INTRAVENOUS | Status: AC
  Filled 2021-05-16: qty 20

## 2021-05-16 MED ORDER — PHENYLEPHRINE 40 MCG/ML (10ML) SYRINGE FOR IV PUSH (FOR BLOOD PRESSURE SUPPORT)
PREFILLED_SYRINGE | INTRAVENOUS | Status: DC | PRN
  Administered 2021-05-16: 80 ug via INTRAVENOUS

## 2021-05-16 MED ORDER — PHENYLEPHRINE 40 MCG/ML (10ML) SYRINGE FOR IV PUSH (FOR BLOOD PRESSURE SUPPORT)
PREFILLED_SYRINGE | INTRAVENOUS | Status: AC
  Filled 2021-05-16: qty 10

## 2021-05-16 MED ORDER — ATROPINE SULFATE 0.4 MG/ML IV SOLN
INTRAVENOUS | Status: AC
  Filled 2021-05-16: qty 1

## 2021-05-16 MED ORDER — SODIUM CHLORIDE 0.9 % IV SOLN: INTRAVENOUS | Status: DC

## 2021-05-16 MED ORDER — LACTATED RINGERS IV SOLN: INTRAVENOUS | Status: DC

## 2021-05-16 MED ORDER — LIDOCAINE HCL (PF) 2 % IJ SOLN
INTRAMUSCULAR | Status: AC
  Filled 2021-05-16: qty 5

## 2021-05-16 NOTE — Transfer of Care (Signed)
Immediate Anesthesia Transfer of Care Note  Patient: [name]  Procedure(s) Performed: CARDIOVERSION  Patient Location: PACU  Anesthesia Type:General  Level of Consciousness: drowsy  Airway & Oxygen Therapy: Patient Spontanous Breathing and Patient connected to nasal cannula oxygen  Post-op Assessment: Report given to RN and Post -op Vital signs reviewed and stable  Post vital signs: Reviewed and stable  Last Vitals:  Vitals Value Taken Time  BP    Temp    Pulse    Resp    SpO2      Last Pain:  Vitals:   05/16/21 0851  TempSrc: Oral  PainSc: 0-No pain      Patients Stated Pain Goal: 5 (34/06/84 0335)  Complications: No notable events documented.

## 2021-05-16 NOTE — Progress Notes (Signed)
Electrical Cardioversion Procedure Note [name] [id number] 1940-10-06  Procedure: Electrical Cardioversion Indications:  Atrial Fibrillation  Procedure Details Consent: Risks of procedure as well as the alternatives and risks of each were explained to the (patient/caregiver).  Consent for procedure obtained. Time Out: Verified patient identification, verified procedure, site/side was marked, verified correct patient position, special equipment/implants available, medications/allergies/relevent history reviewed, required imaging and test results available.  Performed @ [phone]  Patient placed on cardiac monitor, pulse oximetry, supplemental oxygen as necessary.  Sedation given:  per anesthesia Pacer pads placed anterior and posterior chest.  Cardioverted 1 time(s).  Cardioverted at [place].  Evaluation Findings: Post procedure EKG shows:  sinus bradycardia Complications: None Patient did tolerate procedure well.   Sofie Rower R 05/16/2021, 10:26 AM

## 2021-05-16 NOTE — H&P (Signed)
Procedure H&P   Patient presents for outpatient elective electrical cardioversion for persistent atrial fibrillation. For full history please refer to recent clinic note referenced below. Plan for electrical cardioversion today with the assistance of anesthesiology. She has been on xarelto at home for anticoagulation.     Carlyle Dolly MD     Cardiology Office Note   Date: 05/13/2021    ID: Tonisha, Silvey [birth year], MRN [id number]   PCP:  Pablo Lawrence, NP    Cardiologist:  Rozann Lesches, MD Electrophysiologist:  None       Chief Complaint  Patient presents with   Cardiac follow-up      History of Present Illness: GLADY OUDERKIRK is an 81 y.o. female seen recently by Mr. Sharolyn Douglas NP in December 2022, I reviewed the note.  She was referred at that time for an elective cardioversion due to persistent atrial fibrillation.  Procedure was performed by Dr. Harl Bowie on January 3 with successful restoration of sinus rhythm.  She was taken off Cardizem CD at that point but continued on Toprol-XL along with Xarelto.   She presents today unfortunately back in rapid atrial fibrillation.  States that she felt better for about a day after her cardioversion and then short of breath again with sense of palpitations suggesting early recurrence.  She states that she has been compliant with her medications including Toprol-XL and Xarelto.   Today we discussed options for antiarrhythmic therapy with repeat cardioversion attempt.  I personally reviewed her ECG which shows atrial fibrillation at 120 with rightward axis.  Need to avoid flecainide with ischemic heart disease, could consider amiodarone although she does have history of bronchitis and uses inhaler intermittently.  We decided on trial of Multaq, if this is not effective I can have her be evaluated further in atrial fibrillation clinic for Tikosyn.       Past Medical History:  Diagnosis Date   Anemia     Arthritis     Breast cancer  ([place])      Right mastectomy   Chronic back pain     Coronary atherosclerosis of native coronary artery      a. 12/2019 Cath: LM nl, LAD 19m, LCX 50p, 80d, RCA 20p, 13m-->med rx.   Demand ischemia ([place])      a. 02/2021 HsTrop to 361 in setting of rapid afib; b. 02/2021 Echo: EF 65-70%, no rwma, mild LVH, RVSP 28.14mmHg. Mildly to mod dil LA. Triv MR. Ao sclerosis w/o stenosis.   Environmental allergies     Essential hypertension     GERD (gastroesophageal reflux disease)     History of bronchitis     History of colon polyps     History of diverticulosis     History of migraine     Panic attacks     Persistent atrial fibrillation ([place]) 02/2021    a. CHA2DS2VASc = 5-->xarelto.   Prolapse of vaginal vault after hysterectomy     Seasonal allergies     Urinary frequency             Past Surgical History:  Procedure Laterality Date   ABDOMINAL HYSTERECTOMY       BACK SURGERY   1981/2011/2014   BLADDER SUSPENSION       BREAST RECONSTRUCTION   2004    with abd tissue   BREAST SURGERY Right     CARDIOVERSION N/A 04/30/2021    Procedure: CARDIOVERSION;  Surgeon: Arnoldo Lenis, MD;  Location: [facility];  Service: Endoscopy;  Laterality: N/A;   CHOLECYSTECTOMY       COLONOSCOPY   12/2009    RMR: few pancolonic diverticula. next TCS 12/2014   COLONOSCOPY   06/14/2007    MPN:[id number] rectal polyp, status post cold biopsy removal/ Anal canal hemorrhoids/Left-sided diverticula Colonic mucosa appeared normal.Hyperplastic polyp.    COLONOSCOPY   01/18/2004    GQQ:PYPPJKD diverticula/Internal hemorrhoids.  Otherwise normal rectum   COLONOSCOPY N/A 04/18/2013    Dr. Gala Romney- normal rectum, scattered left sided diverticula the remainder of the colonic mucosa appeared normal   COLONOSCOPY N/A 08/18/2014    Procedure: COLONOSCOPY;  Surgeon: Daneil Dolin, MD;  Location: [facility];  Service: Endoscopy;  Laterality: N/A;  1000   ESOPHAGOGASTRODUODENOSCOPY   06/14/2007    TOI:[id number] plaques  in esophageal mucosa of uncertain significance brushed and biopsied/Normal stomach, normal first duodenum and second duodenoscopy. KOH negative. esophageal bx c/w GERD   ESOPHAGOGASTRODUODENOSCOPY N/A 08/18/2014    Procedure: ESOPHAGOGASTRODUODENOSCOPY (EGD);  Surgeon: Daneil Dolin, MD;  [facility]: [facility];  Service: Endoscopy;  Laterality: N/A;   ESOPHAGOGASTRODUODENOSCOPY (EGD) WITH ESOPHAGEAL DILATION N/A 04/18/2013    Dr. Gala Romney- normal, patent appearing, tubular esophagus, normal gastric mucosa, paptent pylorus, normal first and second portion of the duodenum   EYE SURGERY        lasik   FOOT SURGERY Right 2014    d/t plantar fascitis   GIVENS CAPSULE STUDY N/A 07/18/2013    L.[facility] PAC- markedly abnormal appearing gastric mucosa, sugnificant bile reflux, questionable subucosal small bowel mass versus extrinsic compression in the distal small bowel.- CTE= no small bowel mass or tumor seen.   LEFT HEART CATH AND CORONARY ANGIOGRAPHY N/A 01/16/2020    Procedure: LEFT HEART CATH AND CORONARY ANGIOGRAPHY;  Surgeon: Burnell Blanks, MD;  Location: [facility];  Service: Cardiovascular;  Laterality: N/A;   MALONEY DILATION N/A 08/18/2014    Procedure: Venia Minks DILATION;  Surgeon: Daneil Dolin, MD;  [facility]: [facility];  Service: Endoscopy;  Laterality: N/A;   MASTECTOMY        right side 12-13 years ago   MOUTH SURGERY        cyst removed in June 2022.   TOTAL HIP ARTHROPLASTY Left 02/29/2016   TOTAL HIP ARTHROPLASTY Left 02/29/2016    Procedure: TOTAL HIP ARTHROPLASTY ANTERIOR APPROACH;  Surgeon: Frederik Pear, MD;  Location: [facility];  Service: Orthopedics;  Laterality: Left;   TOTAL HIP ARTHROPLASTY Right 01/05/2017    Procedure: TOTAL HIP ARTHROPLASTY ANTERIOR APPROACH;  Surgeon: Frederik Pear, MD;  Location: [facility];  Service: Orthopedics;  Laterality: Right;  REQUEST 90 MINS            Current Outpatient Medications  Medication Sig Dispense Refill   albuterol  (VENTOLIN HFA) 108 (90 Base) MCG/ACT inhaler Inhale 2 puffs into the lungs every 6 (six) hours as needed for shortness of breath or wheezing.       diphenhydrAMINE (BENADRYL) 25 MG tablet Take 25 mg by mouth daily as needed for allergies.       Docusate Calcium (STOOL SOFTENER PO) Take 1 tablet by mouth daily.       dronedarone (MULTAQ) 400 MG tablet Take 1 tablet (400 mg total) by mouth 2 (two) times daily with a meal. 60 tablet 5   HYDROcodone-acetaminophen (NORCO/VICODIN) 5-325 MG tablet Take 1 tablet by mouth every 8 (eight) hours as needed for moderate pain.       metoprolol succinate (TOPROL-XL) 100 MG 24 hr  tablet Take 1 tablet (100 mg total) by mouth daily. Take with or immediately following a meal. 90 tablet 3   omeprazole (PRILOSEC) 20 MG capsule Take 20 mg by mouth daily.       rivaroxaban (XARELTO) 20 MG TABS tablet Take 1 tablet (20 mg total) by mouth daily with supper. 30 tablet 3   rosuvastatin (CRESTOR) 5 MG tablet TAKE ONE TABLET BY MOUTH DAILY 90 tablet 1    No current facility-administered medications for this visit.    Allergies:  Penicillins and Apixaban    Social History: The patient  reports that she quit smoking about 21 years ago. Her smoking use included cigarettes. She has a 45.00 pack-year smoking history. She has never used smokeless tobacco. She reports that she does not drink alcohol and does not use drugs.    Family History: The patient's family history includes Cancer in her brother and sister; Colon cancer in her brother; Coronary artery disease in her brother, father, and mother; Diabetes in her son; Emphysema in her brother; Healthy in her son, son, son, and son; Lung cancer in her sister.    ROS: No orthopnea or PND.  No syncope.   Physical Exam: VS:  BP (!) 142/66    Pulse (!) 120    Ht 5' 5.5" (1.664 m)    Wt 162 lb (73.5 kg)    SpO2 92%    BMI 26.55 kg/m , BMI Body mass index is 26.55 kg/m.      Wt Readings from Last 3 Encounters:  05/13/21 162 lb  (73.5 kg)  04/26/21 161 lb (73 kg)  04/24/21 161 lb (73 kg)    General: Patient appears comfortable at rest. HEENT: Conjunctiva and lids normal, wearing a mask. Neck: Supple, no elevated JVP or carotid bruits, no thyromegaly. Lungs: Decreased breath sounds without wheezing, nonlabored breathing at rest. Cardiac: Irregularly irregular, no S3, 1/6 systolic murmur, no pericardial rub. Abdomen: Soft, nontender, bowel sounds present. Extremities: No pitting edema, distal pulses 2+. Skin: Warm and dry. Musculoskeletal: No kyphosis. Neuropsychiatric: Alert and oriented x3, affect grossly appropriate.   ECG:  An ECG dated 04/30/2021 was personally reviewed today and demonstrated:  Sinus bradycardia.   Recent Labwork: 01/11/2021: ALT 10; AST 24 03/27/2021: Magnesium 2.0 03/28/2021: TSH 3.023 04/24/2021: BUN 27; Creatinine, Ser 1.07; Hemoglobin 11.8; Platelets 324; Potassium 4.3; Sodium 138    Other Studies Reviewed Today:   Echocardiogram 03/28/2021:  1. Left ventricular ejection fraction, by estimation, is 65 to 70%. The  left ventricle has normal function. The left ventricle has no regional  wall motion abnormalities. There is mild left ventricular hypertrophy.  Left ventricular diastolic parameters  are indeterminate.   2. Right ventricular systolic function is normal. The right ventricular  size is normal. There is normal pulmonary artery systolic pressure. The  estimated right ventricular systolic pressure is 78.9 mmHg.   3. Left atrial size was mild to moderately dilated.   4. There is a trivial pericardial effusion posterior to the left  ventricle.   5. The mitral valve is grossly normal. Trivial mitral valve  regurgitation.   6. The aortic valve is tricuspid. Aortic valve regurgitation is not  visualized. Aortic valve sclerosis is present, with no evidence of aortic  valve stenosis. Aortic valve mean gradient measures 3.5 mmHg.   7. The inferior vena cava is normal in size with  greater than 50%  respiratory variability, suggesting right atrial pressure of 3 mmHg.    Assessment and Plan:  1.  Recurrent, persistent atrial fibrillation with RVR.  CHA2DS2-VASc score is 5.  She underwent successful cardioversion in early January, however had fairly quick return to atrial fibrillation.  As discussed above, plan is to initiate Multaq for antiarrhythmic therapy and schedule a follow-up cardioversion later this week.  Continue Toprol-XL and Xarelto.  If this strategy is not effective, she would then need to be referred to the atrial fibrillation clinic for discussion of Tikosyn most likely.   2.  CAD, overall moderate with Lanyia Jewel vessel disease being managed medically.  LVEF 65 to 70% by recent evaluation.  No obvious angina at this time.  Continue Crestor.   Medication Adjustments/Labs and Tests Ordered: Current medicines are reviewed at length with the patient today.  Concerns regarding medicines are outlined above.    Tests Ordered:    Orders Placed This Encounter  Procedures   Basic metabolic panel   EKG 74-JOIN      Medication Changes:     Meds ordered this encounter  Medications   dronedarone (MULTAQ) 400 MG tablet      Sig: Take 1 tablet (400 mg total) by mouth 2 (two) times daily with a meal.      Dispense:  60 tablet      Refill:  5      Disposition:  Follow up  3 to 4 weeks   Signed, Satira Sark, MD, [facility] 05/13/2021 2:49 PM    [facility] [place], [place], [place] [address] Phone: [phone]; Fax: [phone]

## 2021-05-16 NOTE — Anesthesia Procedure Notes (Signed)
Date/Time: 05/16/2021 9:40 AM Performed by: Orlie Dakin, CRNA Pre-anesthesia Checklist: Patient identified, Emergency Drugs available, Suction available and Patient being monitored Patient Re-evaluated:Patient Re-evaluated prior to induction Oxygen Delivery Method: Nasal cannula Induction Type: IV induction Placement Confirmation: positive ETCO2

## 2021-05-16 NOTE — CV Procedure (Addendum)
CV Procedure Note  Procedure: Electrical cardioversion Indication: Persistent atrial fibrillation Physician: Dr Carlyle Dolly MD     Paitient was brought to the procedure suite after appropriate consent was obtained. I independently confirmed with the patient she had not missed any doses of her xarelto within the last 3 weeks. Sedation was achieved with the assistance of anesthesiology, for details please refer to there documentaiton.  Defib pads were placed in the anterior and posterior positions. She was converted from afib with a single synchronized 200j shock from afib to sinus brady in the 50s. Cardiopulmonary monitoring was performed throughout the proedure, she tolerated the procedure well without complications.    Addendum Post conversion HRs remained in the low to mid 40s, bp's are normal. We will continue multaq, stop toprol 100mg  for now, she will come Monday for a nursing visit and EKG check, reconsider adding back a dose of toprol at that time.    Carlyle Dolly MD

## 2021-05-16 NOTE — Discharge Instructions (Addendum)
NURSE VISIT FOR EKG AND VITALS CHECK ON 05/20/2021 at 9:00 AM. Will be at the [facility].

## 2021-05-16 NOTE — Anesthesia Postprocedure Evaluation (Signed)
Anesthesia Post Note  Patient: [name]  Procedure(s) Performed: CARDIOVERSION  Patient location during evaluation: PACU Anesthesia Type: General Level of consciousness: awake and alert Pain management: pain level controlled Vital Signs Assessment: post-procedure vital signs reviewed and stable Respiratory status: spontaneous breathing, nonlabored ventilation, respiratory function stable and patient connected to nasal cannula oxygen Cardiovascular status: blood pressure returned to baseline and stable Postop Assessment: no apparent nausea or vomiting Anesthetic complications: no   No notable events documented.   Last Vitals:  Vitals:   05/16/21 1045 05/16/21 1058  BP: 118/63 (!) 123/50  Pulse: (!) 44 (!) 52  Resp: 15 18  Temp:    SpO2: 99% 99%    Last Pain:  Vitals:   05/16/21 1058  TempSrc:   PainSc: 0-No pain                 Trixie Rude

## 2021-05-16 NOTE — Anesthesia Preprocedure Evaluation (Signed)
Anesthesia Evaluation    Reviewed: Allergy & Precautions, Patient's Chart, lab work & pertinent test results  Airway Mallampati: II  TM Distance: >3 FB Neck ROM: Full    Dental no notable dental hx.    Pulmonary asthma , former smoker,    Pulmonary exam normal        Cardiovascular hypertension, + CAD and + DOE   Rhythm:Irregular     Neuro/Psych Anxiety negative neurological ROS     GI/Hepatic Neg liver ROS, GERD  ,  Endo/Other  negative endocrine ROS  Renal/GU negative Renal ROS     Musculoskeletal  (+) Arthritis ,   Abdominal   Peds  Hematology negative hematology ROS (+) anemia ,   Anesthesia Other Findings   Reproductive/Obstetrics                             Anesthesia Physical Anesthesia Plan  ASA: 3  Anesthesia Plan: General   Post-op Pain Management:    Induction: Intravenous  PONV Risk Score and Plan: 2 and TIVA  Airway Management Planned: Simple Face Mask  Additional Equipment:   Intra-op Plan:   Post-operative Plan:   Informed Consent: I have reviewed the patients History and Physical, chart, labs and discussed the procedure including the risks, benefits and alternatives for the proposed anesthesia with the patient or authorized representative who has indicated his/her understanding and acceptance.     Dental advisory given  Plan Discussed with: CRNA  Anesthesia Plan Comments:         Anesthesia Quick Evaluation

## 2021-05-17 ENCOUNTER — Ambulatory Visit (HOSPITAL_COMMUNITY): Payer: Medicare Other

## 2021-05-20 ENCOUNTER — Encounter (HOSPITAL_COMMUNITY): Payer: Self-pay | Admitting: Cardiology

## 2021-05-20 ENCOUNTER — Other Ambulatory Visit: Payer: Self-pay

## 2021-05-20 ENCOUNTER — Ambulatory Visit (INDEPENDENT_AMBULATORY_CARE_PROVIDER_SITE_OTHER): Payer: Medicare Other

## 2021-05-20 VITALS — BP 138/84 | HR 114 | Ht 65.5 in | Wt 160.8 lb

## 2021-05-20 DIAGNOSIS — I4819 Other persistent atrial fibrillation: Secondary | ICD-10-CM

## 2021-05-20 MED ORDER — METOPROLOL SUCCINATE ER 100 MG PO TB24: 100.0000 mg | ORAL_TABLET | Freq: Every day | ORAL | 3 refills | Status: DC

## 2021-05-20 NOTE — Telephone Encounter (Signed)
Spoke to pt who verbalized understanding.  

## 2021-05-20 NOTE — Telephone Encounter (Signed)
-----   Message from Arnoldo Lenis, MD sent at 05/20/2021  9:22 AM EST ----- Nursing visit EKG shows back in afib with elevated heart rates after cardioversion last week. In the short term please restart her toprol 100mg  daily that we had stopped after her cardioversion. Let her know we will message Dr Domenic Polite for next steps for her.   Melinda Dolly MD

## 2021-05-20 NOTE — Progress Notes (Signed)
Pt presents today for Nurse Visit- EKG/Vitals per Dr. Harl Bowie. Pt stated that she wakes up in the middle of the night with a racing heart. Pt also reports that she wakes up in the morning nauseated.  Pt stated her breathing is somewhat better, but not as good as it previously had been and complains of very little energy.

## 2021-05-21 ENCOUNTER — Telehealth: Payer: Self-pay | Admitting: Cardiology

## 2021-05-21 ENCOUNTER — Telehealth: Payer: Self-pay | Admitting: *Deleted

## 2021-05-21 DIAGNOSIS — I4819 Other persistent atrial fibrillation: Secondary | ICD-10-CM

## 2021-05-21 NOTE — Telephone Encounter (Signed)
-----   Message from Satira Sark, MD sent at 05/21/2021 11:29 AM EST ----- Please have her stop Multaq given rapid return to atrial fibrillation after recent cardioversion. Would have her seen in the Atrial Fibrillation clinic to discuss other antiarrhythmic therapy (possibly Tikosyn). ----- Message ----- From: Arnoldo Lenis, MD Sent: 05/20/2021   9:26 AM EST To: Satira Sark, MD, Berlinda Last, [place]  Nursing visit EKG shows back in afib with elevated heart rates after cardioversion last week. In the short term please restart her toprol 100mg  daily that we had stopped after her cardioversion. Let her know we will message Dr Domenic Polite for next steps for her.   Carlyle Dolly MD

## 2021-05-21 NOTE — Telephone Encounter (Signed)
See previous phone note for details 

## 2021-05-21 NOTE — Telephone Encounter (Signed)
Pt returning phone call... please advise  

## 2021-05-21 NOTE — Telephone Encounter (Signed)
Patient informed and verbalized understanding of plan. 

## 2021-05-27 ENCOUNTER — Ambulatory Visit (HOSPITAL_COMMUNITY): Payer: Medicare Other

## 2021-05-28 ENCOUNTER — Encounter (HOSPITAL_COMMUNITY): Payer: Self-pay | Admitting: Nurse Practitioner

## 2021-05-28 ENCOUNTER — Ambulatory Visit (HOSPITAL_COMMUNITY)
Admission: RE | Admit: 2021-05-28 | Discharge: 2021-05-28 | Disposition: A | Discharge status: Home / Self Care | Payer: Medicare Other | Source: Ambulatory Visit | Attending: Nurse Practitioner | Admitting: Nurse Practitioner

## 2021-05-28 ENCOUNTER — Ambulatory Visit (HOSPITAL_COMMUNITY): Payer: Medicare Other | Admitting: Nurse Practitioner

## 2021-05-28 ENCOUNTER — Other Ambulatory Visit: Payer: Self-pay

## 2021-05-28 VITALS — BP 148/64 | HR 89 | Ht 65.5 in | Wt 159.2 lb

## 2021-05-28 DIAGNOSIS — I10 Essential (primary) hypertension: Secondary | ICD-10-CM | POA: Insufficient documentation

## 2021-05-28 DIAGNOSIS — Z7901 Long term (current) use of anticoagulants: Secondary | ICD-10-CM | POA: Insufficient documentation

## 2021-05-28 DIAGNOSIS — I2510 Atherosclerotic heart disease of native coronary artery without angina pectoris: Secondary | ICD-10-CM | POA: Insufficient documentation

## 2021-05-28 DIAGNOSIS — I4819 Other persistent atrial fibrillation: Secondary | ICD-10-CM | POA: Insufficient documentation

## 2021-05-28 DIAGNOSIS — I272 Pulmonary hypertension, unspecified: Secondary | ICD-10-CM | POA: Insufficient documentation

## 2021-05-28 DIAGNOSIS — D6869 Other thrombophilia: Secondary | ICD-10-CM

## 2021-05-28 NOTE — Progress Notes (Signed)
Primary Care Physician: Pablo Lawrence, NP Referring Physician: Dr. Liliane Channel is a 81 y.o. female with a h/o CAD, HTN,  persistent afib, most recent cardioversion with ERAF after starting Multaq. She also had successful  cardioversion earlier in January with ERAF as well. Dr, Domenic Polite referred her to the afib clinic to discuss Tikosyn. She has some lung issues at baseline, including pulmonary HTN so he did not want to use amiodarone. She feels tired in afib but is rate controlled and tolerating ok.  Today, she denies symptoms of palpitations, chest pain, shortness of breath, orthopnea, PND, lower extremity edema, dizziness, presyncope, syncope, or neurologic sequela. The patient is tolerating medications without difficulties and is otherwise without complaint today.   Past Medical History:  Diagnosis Date   Anemia    Arthritis    Breast cancer ([place])    Right mastectomy   Chronic back pain    Coronary atherosclerosis of native coronary artery    a. 12/2019 Cath: LM nl, LAD 22m, LCX 50p, 80d, RCA 20p, 12m-->med rx.   Demand ischemia ([place])    a. 02/2021 HsTrop to 361 in setting of rapid afib; b. 02/2021 Echo: EF 65-70%, no rwma, mild LVH, RVSP 28.53mmHg. Mildly to mod dil LA. Triv MR. Ao sclerosis w/o stenosis.   Environmental allergies    Essential hypertension    GERD (gastroesophageal reflux disease)    History of bronchitis    History of colon polyps    History of diverticulosis    History of migraine    Panic attacks    Persistent atrial fibrillation ([place]) 02/2021   a. CHA2DS2VASc = 5-->xarelto.   Prolapse of vaginal vault after hysterectomy    Seasonal allergies    Urinary frequency    Past Surgical History:  Procedure Laterality Date   ABDOMINAL HYSTERECTOMY     BACK SURGERY  1981/2011/2014   BLADDER SUSPENSION     BREAST RECONSTRUCTION  2004   with abd tissue   BREAST SURGERY Right    CARDIOVERSION N/A 04/30/2021   Procedure: CARDIOVERSION;  Surgeon:  Arnoldo Lenis, MD;  Location: [facility];  Service: Endoscopy;  Laterality: N/A;   CARDIOVERSION N/A 05/16/2021   Procedure: CARDIOVERSION;  Surgeon: Arnoldo Lenis, MD;  Location: [facility];  Service: Endoscopy;  Laterality: N/A;   CHOLECYSTECTOMY     COLONOSCOPY  12/2009   RMR: few pancolonic diverticula. next TCS 12/2014   COLONOSCOPY  06/14/2007   LYY:[id number] rectal polyp, status post cold biopsy removal/ Anal canal hemorrhoids/Left-sided diverticula Colonic mucosa appeared normal.Hyperplastic polyp.    COLONOSCOPY  01/18/2004   EXN:[id number] diverticula/Internal hemorrhoids.  Otherwise normal rectum   COLONOSCOPY N/A 04/18/2013   Dr. Gala Romney- normal rectum, scattered left sided diverticula the remainder of the colonic mucosa appeared normal   COLONOSCOPY N/A 08/18/2014   Procedure: COLONOSCOPY;  Surgeon: Daneil Dolin, MD;  [facility];  Service: Endoscopy;  Laterality: N/A;  1000   ESOPHAGOGASTRODUODENOSCOPY  06/14/2007   BSW:HQPRFF-MBWG plaques in esophageal mucosa of uncertain significance brushed and biopsied/Normal stomach, normal first duodenum and second duodenoscopy. KOH negative. esophageal bx c/w GERD   ESOPHAGOGASTRODUODENOSCOPY N/A 08/18/2014   Procedure: ESOPHAGOGASTRODUODENOSCOPY (EGD);  Surgeon: Daneil Dolin, MD;  [facility];  Service: Endoscopy;  Laterality: N/A;   ESOPHAGOGASTRODUODENOSCOPY (EGD) WITH ESOPHAGEAL DILATION N/A 04/18/2013   Dr. Gala Romney- normal, patent appearing, tubular esophagus, normal gastric mucosa, paptent pylorus, normal first and second portion of the duodenum   EYE SURGERY  lasik   FOOT SURGERY Right 2014   d/t plantar fascitis   GIVENS CAPSULE STUDY N/A 07/18/2013   L.Lewis PAC- markedly abnormal appearing gastric mucosa, sugnificant bile reflux, questionable subucosal small bowel mass versus extrinsic compression in the distal small bowel.- CTE= no small bowel mass or tumor seen.   LEFT HEART CATH AND CORONARY  ANGIOGRAPHY N/A 01/16/2020   Procedure: LEFT HEART CATH AND CORONARY ANGIOGRAPHY;  Surgeon: Burnell Blanks, MD;  Location: [facility];  Service: Cardiovascular;  Laterality: N/A;   MALONEY DILATION N/A 08/18/2014   Procedure: Venia Minks DILATION;  Surgeon: Daneil Dolin, MD;  [facility];  Service: Endoscopy;  Laterality: N/A;   MASTECTOMY     right side 12-13 years ago   MOUTH SURGERY     cyst removed in June 2022.   TOTAL HIP ARTHROPLASTY Left 02/29/2016   Procedure: TOTAL HIP ARTHROPLASTY ANTERIOR APPROACH;  Surgeon: Frederik Pear, MD;  Location: [facility];  Service: Orthopedics;  Laterality: Left;   TOTAL HIP ARTHROPLASTY Right 01/05/2017   Procedure: TOTAL HIP ARTHROPLASTY ANTERIOR APPROACH;  Surgeon: Frederik Pear, MD;  Location: [facility];  Service: Orthopedics;  Laterality: Right;  REQUEST 90 MINS    Current Outpatient Medications  Medication Sig Dispense Refill   albuterol (VENTOLIN HFA) 108 (90 Base) MCG/ACT inhaler Inhale 1-2 puffs into the lungs every 6 (six) hours as needed for shortness of breath or wheezing.     diphenhydrAMINE (BENADRYL) 25 MG tablet Take 25 mg by mouth as needed for allergies.     HYDROcodone-acetaminophen (NORCO/VICODIN) 5-325 MG tablet Take 1 tablet by mouth every 8 (eight) hours as needed for moderate pain (back pain.).     Menthol-Methyl Salicylate (SALONPAS PAIN RELIEF PATCH) PTCH Place 1 patch onto the skin daily as needed (pain.). (Foot pain)     metoprolol succinate (TOPROL-XL) 100 MG 24 hr tablet Take 1 tablet (100 mg total) by mouth daily. Take with or immediately following a meal. 90 tablet 3   omeprazole (PRILOSEC) 20 MG capsule Take 20 mg by mouth daily as needed (indigestion/heartburn).     rivaroxaban (XARELTO) 20 MG TABS tablet Take 1 tablet (20 mg total) by mouth daily with supper. 30 tablet 3   rosuvastatin (CRESTOR) 5 MG tablet TAKE ONE TABLET BY MOUTH DAILY (Patient taking differently: Take 5 mg by mouth every evening.) 90  tablet 1   No current facility-administered medications for this encounter.    Allergies  Allergen Reactions   Penicillins Anaphylaxis, Swelling and Other (See Comments)    Blisters Has patient had a PCN reaction causing immediate rash, facial/tongue/throat swelling, SOB or lightheadedness with hypotension: Yes Has patient had a PCN reaction causing severe rash involving mucus membranes or skin necrosis: No Has patient had a PCN reaction that required hospitalization No Has patient had a PCN reaction occurring within the last 10 years: No If all of the above answers are "NO", then may proceed with Cephalosporin use.    Apixaban Hives    Social History   Socioeconomic History   Marital status: Widowed    Spouse name: [name]   Number of children: 4   Years of education: 12th   Highest education level: Not on file  Occupational History    Employer: RETIRED  Tobacco Use   Smoking status: Former    Packs/day: 1.50    Years: 30.00    Pack years: 45.00    Types: Cigarettes    Quit date: 04/28/2000  Years since quitting: 21.0   Smokeless tobacco: Never  Vaping Use   Vaping Use: Never used  Substance and Sexual Activity   Alcohol use: No   Drug use: No   Sexual activity: Not on file    Comment: hyst  Other Topics Concern   Not on file  Social History Narrative   Patient lives at home with spouse.   Caffeine Use: 2 cups of coffee daily   Social Determinants of Health   Financial Resource Strain: Not on file  Food Insecurity: Not on file  Transportation Needs: Not on file  Physical Activity: Not on file  Stress: Not on file  Social Connections: Not on file  Intimate Partner Violence: Not on file    Family History  Problem Relation Age of Onset   Coronary artery disease Mother    Coronary artery disease Father    Cancer Sister        lung cancer, two sisters   Lung cancer Sister    Coronary artery disease Brother    Colon cancer Brother        less than age  49   Cancer Brother    Emphysema Brother    Diabetes Son    Healthy Son    Healthy Son    Healthy Son    Healthy Son     ROS- All systems are reviewed and negative except as per the HPI above  Physical Exam: Vitals:   05/28/21 0901  BP: (!) 148/64  Pulse: 89  Weight: 72.2 kg  Height: 5' 5.5" (1.664 m)   Wt Readings from Last 3 Encounters:  05/28/21 72.2 kg  05/20/21 72.9 kg  05/16/21 72.6 kg    Labs: Lab Results  Component Value Date   NA 136 05/15/2021   K 4.0 05/15/2021   CL 105 05/15/2021   CO2 21 (L) 05/15/2021   GLUCOSE 121 (H) 05/15/2021   BUN 31 (H) 05/15/2021   CREATININE 1.09 (H) 05/15/2021   CALCIUM 9.1 05/15/2021   MG 2.0 03/27/2021   Lab Results  Component Value Date   INR 0.93 12/24/2016   No results found for: CHOL, HDL, LDLCALC, TRIG   GEN- The patient is well appearing, alert and oriented x 3 today.   Head- normocephalic, atraumatic Eyes-  Sclera clear, conjunctiva pink Ears- hearing intact Oropharynx- clear Neck- supple, no JVP Lymph- no cervical lymphadenopathy Lungs- Clear to ausculation bilaterally, normal work of breathing Heart- Regular rate and rhythm, no murmurs, rubs or gallops, PMI not laterally displaced GI- soft, NT, ND, + BS Extremities- no clubbing, cyanosis, or edema MS- no significant deformity or atrophy Skin- no rash or lesion Psych- euthymic mood, full affect Neuro- strength and sensation are intact  EKG-Vent. rate 89 BPM PR interval * ms QRS duration 72 ms QT/QTcB 360/438 ms P-R-T axes * 91 65 Atrial fibrillation with premature ventricular or aberrantly conducted complexes Rightward axis Anteroseptal infarct , age undetermined Abnormal ECG When compared with ECG of 19-  Echo-  1. Left ventricular ejection fraction, by estimation, is 65 to 70%. The  left ventricle has normal function. The left ventricle has no regional  wall motion abnormalities. There is mild left ventricular hypertrophy.  Left ventricular  diastolic parameters  are indeterminate.   2. Right ventricular systolic function is normal. The right ventricular  size is normal. There is normal pulmonary artery systolic pressure. The  estimated right ventricular systolic pressure is 03.5 mmHg.   3. Left atrial size was mild  to moderately dilated.   4. There is a trivial pericardial effusion posterior to the left  ventricle.   5. The mitral valve is grossly normal. Trivial mitral valve  regurgitation.   6. The aortic valve is tricuspid. Aortic valve regurgitation is not  visualized. Aortic valve sclerosis is present, with no evidence of aortic  valve stenosis. Aortic valve mean gradient measures 3.5 mmHg.   7. The inferior vena cava is normal in size with greater than 50%  respiratory variability, suggesting right atrial pressure of 3 mmHg.   Comparison(s): Prior images reviewed side by side. LVEF remains normal  without wall motion abnormalities.   LHC- 01/16/20-Mid RCA lesion is 40% stenosed. Prox RCA lesion is 20% stenosed. Dist Cx lesion is 80% stenosed. Prox Cx lesion is 50% stenosed. Mid LAD lesion is 20% stenosed.   1. Moderate stenosis mid Circumflex. This lesion is not flow limiting by functional assessment (DFR 0.96). The small caliber distal Circumflex has a severe stenosis but this vessel appears to be too small for PCI (1.75 mm vessel).  2. Mild plaque in the LAD 3. The RCA is a large dominant vessel with mild to moderate calcified plaque in the mid and distal vessel.    Recommendations: Medical management of CAD. The stenosis in the mid Circumflex is moderate angiographically but by functional flow wire assessment is not flow limiting (DFR 0.96). The small caliber distal Circumflex has a severe stenosis but this vessel appears to be too small for PCI (1.75 mm vessel).   Assessment and Plan:  1. Persistent Afib 2 recent successful cardioversion, second one had Multaq on board but both with ERAF Multaq has been  stopped  She does have CAD so flecainide is not as option I discussed tikosyn with her and she really does not want the hospital stay She would rather discuss an ablation I will refer to EP to discuss    2. CHA2DS2VASc  score of 4 Continue with xarelto 20 mg daily   3. Dental implant Pt has had multiple teeth pulled for infection and has undergone several implants She has one left to do and this is foremost on her mind to have this done Her dentist wants to stop anticoagulation prior to implant She was reminded she can not do this until 4 weeks after last cardioversion and then will have to be back on anticoagulation for at least 3 weeks for ablation or Tikosyn admit  She will call her dentist to schedule today with the time parameters as above  Refer to EP to discuss ablation   Butch Penny C. Pietrina Jagodzinski, [facility] [address] [place], [place] [address] [phone]

## 2021-06-07 ENCOUNTER — Telehealth (HOSPITAL_COMMUNITY): Payer: Self-pay | Admitting: *Deleted

## 2021-06-07 MED ORDER — METOPROLOL SUCCINATE ER 100 MG PO TB24: 50.0000 mg | ORAL_TABLET | Freq: Every day | ORAL | 3 refills | Status: DC

## 2021-06-07 NOTE — Telephone Encounter (Signed)
Patient PCP called stating patient in her office today increased shortness of breath since increasing metoprolol to 100mg  a day HR is in the 60s in the office today. Adline Peals PA discussed with PCP will decrease metoprolol back to 50mg  a day. If her HRs begin to elevate would recommend resuming cardizem 120mg . Pt will be notified via PCP of metoprolol reduction.

## 2021-06-12 ENCOUNTER — Ambulatory Visit: Payer: Medicare Other | Admitting: Student

## 2021-06-12 ENCOUNTER — Other Ambulatory Visit: Payer: Self-pay

## 2021-06-12 ENCOUNTER — Encounter: Payer: Self-pay | Admitting: Student

## 2021-06-12 VITALS — BP 128/82 | HR 84 | Ht 65.5 in | Wt 162.0 lb

## 2021-06-12 DIAGNOSIS — I10 Essential (primary) hypertension: Secondary | ICD-10-CM

## 2021-06-12 DIAGNOSIS — I4819 Other persistent atrial fibrillation: Secondary | ICD-10-CM

## 2021-06-12 DIAGNOSIS — E785 Hyperlipidemia, unspecified: Secondary | ICD-10-CM | POA: Diagnosis not present

## 2021-06-12 DIAGNOSIS — I25118 Atherosclerotic heart disease of native coronary artery with other forms of angina pectoris: Secondary | ICD-10-CM

## 2021-06-12 MED ORDER — NITROGLYCERIN 0.4 MG SL SUBL: 0.4000 mg | SUBLINGUAL_TABLET | SUBLINGUAL | 3 refills | Status: DC | PRN

## 2021-06-12 NOTE — Patient Instructions (Signed)
Medication Instructions:  Take nitroglycerin as directed   Labwork: None today  Testing/Procedures: None today  Follow-Up: 3 months  Any Other Special Instructions Will Be Listed Below (If Applicable).  If you need a refill on your cardiac medications before your next appointment, please call your pharmacy.   Nitroglycerin Sublingual Tablets What is this medication? NITROGLYCERIN (nye troe GLI ser in) prevents and treats chest pain (angina). It works by relaxing blood vessels, which decreases the amount of work the heart has to do. It belongs to a group of medications called nitrates. This medicine may be used for other purposes; ask your health care provider or pharmacist if you have questions. COMMON BRAND NAME(S): Nitroquick, Nitrostat, Nitrotab What should I tell my care team before I take this medication? They need to know if you have any of these conditions: Anemia Head injury, recent stroke, or bleeding in the brain Liver disease Previous heart attack An unusual or allergic reaction to nitroglycerin, other medications, foods, dyes, or preservatives Pregnant or trying to get pregnant Breast-feeding How should I use this medication? Take this medication by mouth as needed. Use at the first sign of an angina attack (chest pain or tightness). You can also take this medication 5 to 10 minutes before an event likely to produce chest pain. Follow the directions exactly as written on the prescription label. Place one tablet under your tongue and let it dissolve. Do not swallow whole. Replace the dose if you accidentally swallow it. It will help if your mouth is not dry. Saliva around the tablet will help it to dissolve more quickly. Do not eat or drink, smoke or chew tobacco while a tablet is dissolving. Sit down when taking this medication. In an angina attack, you should feel better within 5 minutes after your first dose. You can take a dose every 5 minutes up to a total of 3 doses.  If you do not feel better or feel worse after 1 dose, call 9-1-1 at once. Do not take more than 3 doses in 15 minutes. Your care team might give you other directions. Follow those directions if they do. Do not take your medication more often than directed. Talk to your care team about the use of this medication in children. Special care may be needed. Overdosage: If you think you have taken too much of this medicine contact a poison control center or emergency room at once. NOTE: This medicine is only for you. Do not share this medicine with others. What if I miss a dose? This does not apply. This medication is only used as needed. What may interact with this medication? Do not take this medication with any of the following: Certain migraine medications like ergotamine and dihydroergotamine (DHE) Medications used to treat erectile dysfunction like sildenafil, tadalafil, and vardenafil Riociguat This medication may also interact with the following: Alteplase Aspirin Heparin Medications for high blood pressure Medications for mental depression Other medications used to treat angina Phenothiazines like chlorpromazine, mesoridazine, prochlorperazine, thioridazine This list may not describe all possible interactions. Give your health care provider a list of all the medicines, herbs, non-prescription drugs, or dietary supplements you use. Also tell them if you smoke, drink alcohol, or use illegal drugs. Some items may interact with your medicine. What should I watch for while using this medication? Tell your care team if you feel your medication is no longer working. Keep this medication with you at all times. Sit or lie down when you take your medication to  prevent falling if you feel dizzy or faint after using it. Try to remain calm. This will help you to feel better faster. If you feel dizzy, take several deep breaths and lie down with your feet propped up, or bend forward with your head resting  between your knees. You may get drowsy or dizzy. Do not drive, use machinery, or do anything that needs mental alertness until you know how this medication affects you. Do not stand or sit up quickly, especially if you are an older patient. This reduces the risk of dizzy or fainting spells. Alcohol can make you more drowsy and dizzy. Avoid alcoholic drinks. Do not treat yourself for coughs, colds, or pain while you are taking this medication without asking your care team for advice. Some ingredients may increase your blood pressure. What side effects may I notice from receiving this medication? Side effects that you should report to your care team as soon as possible: Allergic reactions--skin rash, itching, hives, swelling of the face, lips, tongue, or throat Headache, unusual weakness or fatigue, shortness of breath, nausea, vomiting, rapid heartbeat, blue skin or lips, which may be signs of methemoglobinemia Increased pressure around the brain--severe headache, blurry vision, change in vision, nausea, vomiting Low blood pressure--dizziness, feeling faint or lightheaded, blurry vision Slow heartbeat--dizziness, feeling faint or lightheaded, confusion, trouble breathing, unusual weakness or fatigue Worsening chest pain (angina)--pain, pressure, or tightness in the chest, neck, back, or arms Side effects that usually do not require medical attention (report to your care team if they continue or are bothersome): Dizziness Flushing Headache This list may not describe all possible side effects. Call your doctor for medical advice about side effects. You may report side effects to FDA at 1-800-FDA-1088. Where should I keep my medication? Keep out of the reach of children. Store at room temperature between 20 and 25 degrees C (68 and 77 degrees F). Store in [facility] [employer]. Protect from light and moisture. Keep tightly closed. Throw away any unused medication after the expiration date. NOTE: This  sheet is a summary. It may not cover all possible information. If you have questions about this medicine, talk to your doctor, pharmacist, or health care provider.  2022 Elsevier/Gold Standard (2020-07-24 00:00:00)

## 2021-06-12 NOTE — Progress Notes (Signed)
Cardiology Office Note    Date:  06/12/2021   ID:  Melinda, Gould 1940/06/13, MRN [id number]  PCP:  Pablo Lawrence, NP  Cardiologist: Rozann Lesches, MD    Chief Complaint  Patient presents with   Follow-up    Recent DCCV    History of Present Illness:    [name] is a 81 y.o. female with past medical history of CAD (s/p cath in 12/2019 showing nonobstructive CAD and medical management recommended), HTN, HLD, persistent atrial fibrillation and history of breast cancer (s/p right mastectomy) who presents to the office today for follow-up from her DCCV.   She was last examined by Dr. Domenic Polite in 04/2021 and had undergone DCCV earlier that month but was already back in atrial fibrillation with RVR at the time of her visit. She was started on Multaq 400mg  BID with plans for a repeat DCCV. Underwent repeat DCCV on 05/16/2021 with successful conversion back to NSR and was bradycardiac, therefore Toprol-XL was stopped and she was continued on Multaq. She had a nurse visit on 05/20/2021 and was back in atrial fibrillation, therefore Multaq was stopped and she was restarted on Toprol-XL 100mg  daily. She was referred to the [facility] for consideration of Tikosyn.   She did see Roderic Palau, NP on 05/28/2021 and she wished to avoid Tikosyn given the hospitalization for initiation and due to concerns about missing doses, therefore was referred to EP for consideration of ablation. She did contact the office on 06/07/2021 reporting worsening dyspnea and her PCP had decreased her Toprol-XL to 50mg  daily.   In talking with the patient today, she does report still having dyspnea on exertion but symptoms did improve with recent dose adjustment of Toprol-XL. She continues to experience intermittent palpitations which are typically most notable with activity. Denies any exertional chest pain but does develop a discomfort along her right pectoral region at night and is unaware of  any specific triggers. No reported orthopnea, PND or pitting edema. She has been taking Xarelto consistently with no reports of active bleeding.   Past Medical History:  Diagnosis Date   Anemia    Arthritis    Breast cancer ([place])    Right mastectomy   Chronic back pain    Coronary atherosclerosis of native coronary artery    a. 12/2019 Cath: LM nl, LAD 22m, LCX 50p, 80d, RCA 20p, 48m-->med rx.   Demand ischemia ([place])    a. 02/2021 HsTrop to 361 in setting of rapid afib; b. 02/2021 Echo: EF 65-70%, no rwma, mild LVH, RVSP 28.41mmHg. Mildly to mod dil LA. Triv MR. Ao sclerosis w/o stenosis.   Environmental allergies    Essential hypertension    GERD (gastroesophageal reflux disease)    History of bronchitis    History of colon polyps    History of diverticulosis    History of migraine    Panic attacks    Persistent atrial fibrillation ([place]) 02/2021   a. CHA2DS2VASc = 5-->xarelto.   Prolapse of vaginal vault after hysterectomy    Seasonal allergies    Urinary frequency     Past Surgical History:  Procedure Laterality Date   ABDOMINAL HYSTERECTOMY     BACK SURGERY  1981/2011/2014   BLADDER SUSPENSION     BREAST RECONSTRUCTION  2004   with abd tissue   BREAST SURGERY Right    CARDIOVERSION N/A 04/30/2021   Procedure: CARDIOVERSION;  Surgeon: Arnoldo Lenis, MD;  Location: [facility];  Service: Endoscopy;  Laterality:  N/A;   CARDIOVERSION N/A 05/16/2021   Procedure: CARDIOVERSION;  Surgeon: Arnoldo Lenis, MD;  Location: [facility];  Service: Endoscopy;  Laterality: N/A;   CHOLECYSTECTOMY     COLONOSCOPY  12/2009   RMR: few pancolonic diverticula. next TCS 12/2014   COLONOSCOPY  06/14/2007   WUJ:[id number] rectal polyp, status post cold biopsy removal/ Anal canal hemorrhoids/Left-sided diverticula Colonic mucosa appeared normal.Hyperplastic polyp.    COLONOSCOPY  01/18/2004   FAO:ZHYQMVH diverticula/Internal hemorrhoids.  Otherwise normal rectum   COLONOSCOPY N/A 04/18/2013    Dr. Gala Romney- normal rectum, scattered left sided diverticula the remainder of the colonic mucosa appeared normal   COLONOSCOPY N/A 08/18/2014   Procedure: COLONOSCOPY;  Surgeon: Daneil Dolin, MD;  [facility];  Service: Endoscopy;  Laterality: N/A;  1000   ESOPHAGOGASTRODUODENOSCOPY  06/14/2007   QIO:[id number]-[id number] plaques in esophageal mucosa of uncertain significance brushed and biopsied/Normal stomach, normal first duodenum and second duodenoscopy. KOH negative. esophageal bx c/w GERD   ESOPHAGOGASTRODUODENOSCOPY N/A 08/18/2014   Procedure: ESOPHAGOGASTRODUODENOSCOPY (EGD);  Surgeon: Daneil Dolin, MD;  [facility];  Service: Endoscopy;  Laterality: N/A;   ESOPHAGOGASTRODUODENOSCOPY (EGD) WITH ESOPHAGEAL DILATION N/A 04/18/2013   Dr. Gala Romney- normal, patent appearing, tubular esophagus, normal gastric mucosa, paptent pylorus, normal first and second portion of the duodenum   EYE SURGERY     lasik   FOOT SURGERY Right 2014   d/t plantar fascitis   GIVENS CAPSULE STUDY N/A 07/18/2013   L.[facility] PAC- markedly abnormal appearing gastric mucosa, sugnificant bile reflux, questionable subucosal small bowel mass versus extrinsic compression in the distal small bowel.- CTE= no small bowel mass or tumor seen.   LEFT HEART CATH AND CORONARY ANGIOGRAPHY N/A 01/16/2020   Procedure: LEFT HEART CATH AND CORONARY ANGIOGRAPHY;  Surgeon: Burnell Blanks, MD;  Location: [facility];  Service: Cardiovascular;  Laterality: N/A;   MALONEY DILATION N/A 08/18/2014   Procedure: Venia Minks DILATION;  Surgeon: Daneil Dolin, MD;  [facility];  Service: Endoscopy;  Laterality: N/A;   MASTECTOMY     right side 12-13 years ago   MOUTH SURGERY     cyst removed in June 2022.   TOTAL HIP ARTHROPLASTY Left 02/29/2016   Procedure: TOTAL HIP ARTHROPLASTY ANTERIOR APPROACH;  Surgeon: Frederik Pear, MD;  Location: [facility];  Service: Orthopedics;  Laterality: Left;   TOTAL HIP  ARTHROPLASTY Right 01/05/2017   Procedure: TOTAL HIP ARTHROPLASTY ANTERIOR APPROACH;  Surgeon: Frederik Pear, MD;  Location: [facility];  Service: Orthopedics;  Laterality: Right;  REQUEST 90 MINS    Current Medications: Outpatient Medications Prior to Visit  Medication Sig Dispense Refill   albuterol (VENTOLIN HFA) 108 (90 Base) MCG/ACT inhaler Inhale 1-2 puffs into the lungs every 6 (six) hours as needed for shortness of breath or wheezing.     diphenhydrAMINE (BENADRYL) 25 MG tablet Take 25 mg by mouth as needed for allergies.     HYDROcodone-acetaminophen (NORCO/VICODIN) 5-325 MG tablet Take 1 tablet by mouth every 8 (eight) hours as needed for moderate pain (back pain.).     Menthol-Methyl Salicylate (SALONPAS PAIN RELIEF PATCH) PTCH Place 1 patch onto the skin daily as needed (pain.). (Foot pain)     metoprolol succinate (TOPROL-XL) 100 MG 24 hr tablet Take 0.5 tablets (50 mg total) by mouth daily. Take with or immediately following a meal. 90 tablet 3   omeprazole (PRILOSEC) 20 MG capsule Take 20 mg by mouth daily as needed (indigestion/heartburn).  rivaroxaban (XARELTO) 20 MG TABS tablet Take 1 tablet (20 mg total) by mouth daily with supper. 30 tablet 3   rosuvastatin (CRESTOR) 5 MG tablet TAKE ONE TABLET BY MOUTH DAILY (Patient taking differently: Take 5 mg by mouth every evening.) 90 tablet 1   No facility-administered medications prior to visit.     Allergies:   Penicillins and Apixaban   Social History   Socioeconomic History   Marital status: Widowed    Spouse name: [name]   Number of children: 4   Years of education: 12th   Highest education level: Not on file  Occupational History    Employer: RETIRED  Tobacco Use   Smoking status: Former    Packs/day: 1.50    Years: 30.00    Pack years: 45.00    Types: Cigarettes    Quit date: 04/28/2000    Years since quitting: 21.1   Smokeless tobacco: Never  Vaping Use   Vaping Use: Never used  Substance and Sexual Activity    Alcohol use: No   Drug use: No   Sexual activity: Not on file    Comment: hyst  Other Topics Concern   Not on file  Social History Narrative   Patient lives at home with spouse.   Caffeine Use: 2 cups of coffee daily   Social Determinants of Health   Financial Resource Strain: Not on file  Food Insecurity: Not on file  Transportation Needs: Not on file  Physical Activity: Not on file  Stress: Not on file  Social Connections: Not on file     Family History:  The patient's family history includes Cancer in her brother and sister; Colon cancer in her brother; Coronary artery disease in her brother, father, and mother; Diabetes in her son; Emphysema in her brother; Healthy in her son, son, son, and son; Lung cancer in her sister.   Review of Systems:    Please see the history of present illness.     All other systems reviewed and are otherwise negative except as noted above.   Physical Exam:    VS:  BP 128/82    Pulse 84    Ht 5' 5.5" (1.664 m)    Wt 162 lb (73.5 kg)    SpO2 96%    BMI 26.55 kg/m    General: Pleasant elderly female appearing in no acute distress. Head: Normocephalic, atraumatic. Neck: No carotid bruits. JVD not elevated.  Lungs: Respirations regular and unlabored, without wheezes or rales.  Heart: Irregularly irregular. No S3 or S4.  No murmur, no rubs, or gallops appreciated. Abdomen: Appears non-distended. No obvious abdominal masses. Msk:  Strength and tone appear normal for age. No obvious joint deformities or effusions. Extremities: No clubbing or cyanosis. No pitting edema.  Distal pedal pulses are 2+ bilaterally. Neuro: Alert and oriented X 3. Moves all extremities spontaneously. No focal deficits noted. Psych:  Responds to questions appropriately with a normal affect. Skin: No rashes or lesions noted  Wt Readings from Last 3 Encounters:  06/12/21 162 lb (73.5 kg)  05/28/21 159 lb 3.2 oz (72.2 kg)  05/20/21 160 lb 12.8 oz (72.9 kg)      Studies/Labs Reviewed:   EKG:  EKG is not ordered today. EKG from 05/28/2021 is reviewed and shows rate-controlled atrial fibrillation, HR 89 with PVC's.   Recent Labs: 01/11/2021: ALT 10 03/27/2021: Magnesium 2.0 03/28/2021: TSH 3.023 04/24/2021: Hemoglobin 11.8; Platelets 324 05/15/2021: BUN 31; Creatinine, Ser 1.09; Potassium 4.0; Sodium 136   Lipid  Panel No results found for: CHOL, TRIG, HDL, CHOLHDL, VLDL, LDLCALC, LDLDIRECT  Additional studies/ records that were reviewed today include:   Cardiac Catheterization: 12/2019 Mid RCA lesion is 40% stenosed. Prox RCA lesion is 20% stenosed. Dist Cx lesion is 80% stenosed. Prox Cx lesion is 50% stenosed. Mid LAD lesion is 20% stenosed.   1. Moderate stenosis mid Circumflex. This lesion is not flow limiting by functional assessment (DFR 0.96). The small caliber distal Circumflex has a severe stenosis but this vessel appears to be too small for PCI (1.75 mm vessel).  2. Mild plaque in the LAD 3. The RCA is a large dominant vessel with mild to moderate calcified plaque in the mid and distal vessel.    Recommendations: Medical management of CAD. The stenosis in the mid Circumflex is moderate angiographically but by functional flow wire assessment is not flow limiting (DFR 0.96). The small caliber distal Circumflex has a severe stenosis but this vessel appears to be too small for PCI (1.75 mm vessel).     Echocardiogram: 03/2021 IMPRESSIONS     1. Left ventricular ejection fraction, by estimation, is 65 to 70%. The  left ventricle has normal function. The left ventricle has no regional  wall motion abnormalities. There is mild left ventricular hypertrophy.  Left ventricular diastolic parameters  are indeterminate.   2. Right ventricular systolic function is normal. The right ventricular  size is normal. There is normal pulmonary artery systolic pressure. The  estimated right ventricular systolic pressure is 16.1 mmHg.   3. Left  atrial size was mild to moderately dilated.   4. There is a trivial pericardial effusion posterior to the left  ventricle.   5. The mitral valve is grossly normal. Trivial mitral valve  regurgitation.   6. The aortic valve is tricuspid. Aortic valve regurgitation is not  visualized. Aortic valve sclerosis is present, with no evidence of aortic  valve stenosis. Aortic valve mean gradient measures 3.5 mmHg.   7. The inferior vena cava is normal in size with greater than 50%  respiratory variability, suggesting right atrial pressure of 3 mmHg.   Assessment:    1. Persistent atrial fibrillation ([place])   2. Coronary artery disease involving native coronary artery of native heart with other form of angina pectoris ([place])   3. Essential hypertension   4. Hyperlipidemia LDL goal <70      Plan:   In order of problems listed above:  1. Persistent Atrial Fibrillation - She failed recent DCCV in  04/2021 after being started on Multaq and has followed up with the [facility] and did not wish to proceed with Tikosyn initiation, therefore she has been referred to EP for consideration of ablation. She is scheduled to see Dr. Quentin Ore next month. - We did check her ambulatory vitals today and saturations remained greater than 97% and her heart rate did increase into the 120's with activity. Given her episodes of bradycardia at rest, I recommended that she continue Toprol-XL 50 mg daily for now. If her palpitations become more of an issue within the next month, could titrate to 75 mg daily. - No reports of active bleeding. She remains on Xarelto 20 mg daily for anticoagulation. Her creatinine clearance was at 47 mL/min based off labs in 04/2021 but previously greater than 50 mL/min based off labs prior to this in 03/2021. Will continue to follow to make sure she remains on the appropriate dose. I did not reduce dosing at this time.  2. CAD/Atypical  Chest Pain - She has known CAD with prior  cath in 12/2019 showing nonobstructive CAD and medical management recommended as her distal LCx was too small for PCI. She was experiencing dyspnea on exertion but this improved with dose reduction of Toprol-XL. She does report occasional right-sided pain but this occurs at rest and spontaneously resolves and no association with exertion. Has a history of right-sided mastectomy.  - We reviewed the possibility of antianginal therapy with adding Imdur or considering a repeat stress test or Coronary CT but she wishes to hold off on this for now until she follows up with EP next month to discuss ablation. Will provide Rx for sublingual nitroglycerin. Continue Toprol-XL and Crestor. No ASA given the need for anticoagulation.  3.  HTN - Her blood pressure is well controlled at 128/82 during today's visit. Continue current medication regimen with Toprol-XL 50 mg daily.  4. HLD - Followed by her PCP. She remains on Crestor 5 mg daily with goal LDL less than 70 in the setting of known CAD.   Medication Adjustments/Labs and Tests Ordered: Current medicines are reviewed at length with the patient today.  Concerns regarding medicines are outlined above.  Medication changes, Labs and Tests ordered today are listed in the Patient Instructions below. Patient Instructions  Medication Instructions:  Take nitroglycerin as directed   Labwork: None today  Testing/Procedures: None today  Follow-Up: 3 months  Any Other Special Instructions Will Be Listed Below (If Applicable).  If you need a refill on your cardiac medications before your next appointment, please call your pharmacy.   Nitroglycerin Sublingual Tablets What is this medication? NITROGLYCERIN (nye troe GLI ser in) prevents and treats chest pain (angina). It works by relaxing blood vessels, which decreases the amount of work the heart has to do. It belongs to a group of medications called nitrates. This medicine may be used for other purposes;  ask your health care provider or pharmacist if you have questions. COMMON BRAND NAME(S): Nitroquick, Nitrostat, Nitrotab What should I tell my care team before I take this medication? They need to know if you have any of these conditions: Anemia Head injury, recent stroke, or bleeding in the brain Liver disease Previous heart attack An unusual or allergic reaction to nitroglycerin, other medications, foods, dyes, or preservatives Pregnant or trying to get pregnant Breast-feeding How should I use this medication? Take this medication by mouth as needed. Use at the first sign of an angina attack (chest pain or tightness). You can also take this medication 5 to 10 minutes before an event likely to produce chest pain. Follow the directions exactly as written on the prescription label. Place one tablet under your tongue and let it dissolve. Do not swallow whole. Replace the dose if you accidentally swallow it. It will help if your mouth is not dry. Saliva around the tablet will help it to dissolve more quickly. Do not eat or drink, smoke or chew tobacco while a tablet is dissolving. Sit down when taking this medication. In an angina attack, you should feel better within 5 minutes after your first dose. You can take a dose every 5 minutes up to a total of 3 doses. If you do not feel better or feel worse after 1 dose, call 9-1-1 at once. Do not take more than 3 doses in 15 minutes. Your care team might give you other directions. Follow those directions if they do. Do not take your medication more often than directed. Talk to your care  team about the use of this medication in children. Special care may be needed. Overdosage: If you think you have taken too much of this medicine contact a poison control center or emergency room at once. NOTE: This medicine is only for you. Do not share this medicine with others. What if I miss a dose? This does not apply. This medication is only used as needed. What may  interact with this medication? Do not take this medication with any of the following: Certain migraine medications like ergotamine and dihydroergotamine (DHE) Medications used to treat erectile dysfunction like sildenafil, tadalafil, and vardenafil Riociguat This medication may also interact with the following: Alteplase Aspirin Heparin Medications for high blood pressure Medications for mental depression Other medications used to treat angina Phenothiazines like chlorpromazine, mesoridazine, prochlorperazine, thioridazine This list may not describe all possible interactions. Give your health care provider a list of all the medicines, herbs, non-prescription drugs, or dietary supplements you use. Also tell them if you smoke, drink alcohol, or use illegal drugs. Some items may interact with your medicine. What should I watch for while using this medication? Tell your care team if you feel your medication is no longer working. Keep this medication with you at all times. Sit or lie down when you take your medication to prevent falling if you feel dizzy or faint after using it. Try to remain calm. This will help you to feel better faster. If you feel dizzy, take several deep breaths and lie down with your feet propped up, or bend forward with your head resting between your knees. You may get drowsy or dizzy. Do not drive, use machinery, or do anything that needs mental alertness until you know how this medication affects you. Do not stand or sit up quickly, especially if you are an older patient. This reduces the risk of dizzy or fainting spells. Alcohol can make you more drowsy and dizzy. Avoid alcoholic drinks. Do not treat yourself for coughs, colds, or pain while you are taking this medication without asking your care team for advice. Some ingredients may increase your blood pressure. What side effects may I notice from receiving this medication? Side effects that you should report to your care  team as soon as possible: Allergic reactions--skin rash, itching, hives, swelling of the face, lips, tongue, or throat Headache, unusual weakness or fatigue, shortness of breath, nausea, vomiting, rapid heartbeat, blue skin or lips, which may be signs of methemoglobinemia Increased pressure around the brain--severe headache, blurry vision, change in vision, nausea, vomiting Low blood pressure--dizziness, feeling faint or lightheaded, blurry vision Slow heartbeat--dizziness, feeling faint or lightheaded, confusion, trouble breathing, unusual weakness or fatigue Worsening chest pain (angina)--pain, pressure, or tightness in the chest, neck, back, or arms Side effects that usually do not require medical attention (report to your care team if they continue or are bothersome): Dizziness Flushing Headache This list may not describe all possible side effects. Call your doctor for medical advice about side effects. You may report side effects to FDA at 1-800-FDA-1088. Where should I keep my medication? Keep out of the reach of children. Store at room temperature between 20 and 25 degrees C (68 and 77 degrees F). Store in [facility] [employer]. Protect from light and moisture. Keep tightly closed. Throw away any unused medication after the expiration date. NOTE: This sheet is a summary. It may not cover all possible information. If you have questions about this medicine, talk to your doctor, pharmacist, or health care provider.  2022 Elsevier/Gold  Standard (2020-07-24 00:00:00)    Signed, Erma Heritage, PA-C  06/12/2021 5:26 PM    [facility] [address] [place], [place] [address] Phone: [phone] Fax: [phone]

## 2021-07-09 ENCOUNTER — Ambulatory Visit: Payer: Medicare Other | Admitting: Physician Assistant

## 2021-07-12 ENCOUNTER — Ambulatory Visit (HOSPITAL_COMMUNITY): Payer: Medicare Other

## 2021-07-23 NOTE — Progress Notes (Signed)
?Electrophysiology Office Note:   ? ?Date:  07/24/2021  ? ?ID:  [name], DOB Aug 04, 1940, MRN [id number] ? ?PCP:  Pablo Lawrence, NP  ?[facility] Cardiologist:  Rozann Lesches, MD  ?[facility] Electrophysiologist:  Vickie Epley, MD  ? ?Referring MD: Sherran Needs, NP  ? ?Chief Complaint: AF ? ?History of Present Illness:   ? ?[name] is a 81 y.o. female who presents for an evaluation of AF at the request of Doristine Devoid. Their medical history includes CAD, HTN, persistent AF. She has previously taken multaq and failed cardioversion. She is fatigued while out of rhythm. She was referred by Dr Domenic Polite to the AF clinic to discuss dofetilide but the patient was more interested in pursuing catheter ablation.  ? ?She is highly symptomatic with her atrial fibrillation.  She reports a markedly reduced exercise tolerance and shortness of breath.  She has had a cardioversion in the past.  She does tell me that she felt better in the days after the cardioversion before returning to atrial fibrillation. ? ? ?  ?Past Medical History:  ?Diagnosis Date  ? Anemia   ? Arthritis   ? Breast cancer ([place])   ? Right mastectomy  ? Chronic back pain   ? Coronary atherosclerosis of native coronary artery   ? a. 12/2019 Cath: LM nl, LAD 55m LCX 50p, 80d, RCA 20p, 45m>med rx.  ? Demand ischemia ([place]  ? a. 02/2021 HsTrop to 361 in setting of rapid afib; b. 02/2021 Echo: EF 65-70%, no rwma, mild LVH, RVSP 28.53m70m. Mildly to mod dil LA. Triv MR. Ao sclerosis w/o stenosis.  ? Environmental allergies   ? Essential hypertension   ? GERD (gastroesophageal reflux disease)   ? History of bronchitis   ? History of colon polyps   ? History of diverticulosis   ? History of migraine   ? Panic attacks   ? [place]  ? a. CHA2DS2VASc = 5-->xarelto.  ? Prolapse of vaginal vault after hysterectomy   ? Seasonal allergies   ? Urinary frequency   ? ? ?Past Surgical History:  ?Procedure  Laterality Date  ? ABDOMINAL HYSTERECTOMY    ? BACK SURGERY  1981/2011/2014  ? BLADDER SUSPENSION    ? BREAST RECONSTRUCTION  2004  ? with abd tissue  ? BREAST SURGERY Right   ? CARDIOVERSION N/A 04/30/2021  ? Procedure: CARDIOVERSION;  Surgeon: BraArnoldo LenisD;  [facility]: [facility];  Service: Endoscopy;  Laterality: N/A;  ? CARDIOVERSION N/A 05/16/2021  ? Procedure: CARDIOVERSION;  Surgeon: BraArnoldo LenisD;  Location: [facility];  Service: Endoscopy;  Laterality: N/A;  ? CHOLECYSTECTOMY    ? COLONOSCOPY  12/2009  ? RMR: few pancolonic diverticula. next TCS 12/2014  ? COLONOSCOPY  06/14/2007  ? RMRLGX:QJJHERDEYCctal polyp, status post cold biopsy removal/ Anal canal hemorrhoids/Left-sided diverticula Colonic mucosa appeared normal.Hyperplastic polyp.   ? COLONOSCOPY  01/18/2004  ? RMRXKG:YJEHUDJverticula/Internal hemorrhoids.  Otherwise normal rectum  ? COLONOSCOPY N/A 04/18/2013  ? Dr. RouGala Romneyormal rectum, scattered left sided diverticula the remainder of the colonic mucosa appeared normal  ? COLONOSCOPY N/A 08/18/2014  ? Procedure: COLONOSCOPY;  Surgeon: RobDaneil DolinD;  [facility];  Service: Endoscopy;  Laterality: N/A;  1000  ? ESOPHAGOGASTRODUODENOSCOPY  06/14/2007  ? RMRSHF:WYOVZC-HYIFaques in esophageal mucosa of uncertain significance brushed and biopsied/Normal stomach, normal first duodenum and second duodenoscopy. KOH negative. esophageal bx c/w GERD  ? ESOPHAGOGASTRODUODENOSCOPY N/A  08/18/2014  ? Procedure: ESOPHAGOGASTRODUODENOSCOPY (EGD);  Surgeon: Daneil Dolin, MD;  [facility];  Service: Endoscopy;  Laterality: N/A;  ? ESOPHAGOGASTRODUODENOSCOPY (EGD) WITH ESOPHAGEAL DILATION N/A 04/18/2013  ? Dr. Gala Romney- normal, patent appearing, tubular esophagus, normal gastric mucosa, paptent pylorus, normal first and second portion of the duodenum  ? EYE SURGERY    ? lasik  ? FOOT SURGERY Right 2014  ? d/t plantar fascitis  ? GIVENS CAPSULE STUDY N/A 07/18/2013  ? L.Lewis PAC-  markedly abnormal appearing gastric mucosa, sugnificant bile reflux, questionable subucosal small bowel mass versus extrinsic compression in the distal small bowel.- CTE= no small bowel mass or tumor seen.  ? LEFT HEART CATH AND CORONARY ANGIOGRAPHY N/A 01/16/2020  ? Procedure: LEFT HEART CATH AND CORONARY ANGIOGRAPHY;  Surgeon: Burnell Blanks, MD;  Location: [place] [facility];  Service: Cardiovascular;  Laterality: N/A;  ? MALONEY DILATION N/A 08/18/2014  ? Procedure: MALONEY DILATION;  Surgeon: Daneil Dolin, MD;  [facility];  Service: Endoscopy;  Laterality: N/A;  ? MASTECTOMY    ? right side 12-13 years ago  ? MOUTH SURGERY    ? cyst removed in June 2022.  ? TOTAL HIP ARTHROPLASTY Left 02/29/2016  ? Procedure: TOTAL HIP ARTHROPLASTY ANTERIOR APPROACH;  Surgeon: Frederik Pear, MD;  Location: [place];  Service: Orthopedics;  Laterality: Left;  ? TOTAL HIP ARTHROPLASTY Right 01/05/2017  ? Procedure: TOTAL HIP ARTHROPLASTY ANTERIOR APPROACH;  Surgeon: Frederik Pear, MD;  Location: [place];  Service: Orthopedics;  Laterality: Right;  REQUEST 90 MINS  ? ? ?Current Medications: ?Current Meds  ?Medication Sig  ? albuterol (VENTOLIN HFA) 108 (90 Base) MCG/ACT inhaler Inhale 1-2 puffs into the lungs every 6 (six) hours as needed for shortness of breath or wheezing.  ? diphenhydrAMINE (BENADRYL) 25 MG tablet Take 25 mg by mouth as needed for allergies.  ? HYDROcodone-acetaminophen (NORCO/VICODIN) 5-325 MG tablet Take 1 tablet by mouth every 8 (eight) hours as needed for moderate pain (back pain.).  ? Menthol-Methyl Salicylate (SALONPAS PAIN RELIEF PATCH) PTCH Place 1 patch onto the skin daily as needed (pain.). (Foot pain)  ? metoprolol succinate (TOPROL-XL) 100 MG 24 hr tablet Take 0.5 tablets (50 mg total) by mouth daily. Take with or immediately following a meal.  ? nitroGLYCERIN (NITROSTAT) 0.4 MG SL tablet Place 1 tablet (0.4 mg total) under the tongue every 5 (five) minutes as needed for chest pain.   ? omeprazole (PRILOSEC) 20 MG capsule Take 20 mg by mouth daily as needed (indigestion/heartburn).  ? rivaroxaban (XARELTO) 20 MG TABS tablet Take 1 tablet (20 mg total) by mouth daily with supper.  ? rosuvastatin (CRESTOR) 5 MG tablet TAKE ONE TABLET BY MOUTH DAILY (Patient taking differently: Take 5 mg by mouth every evening.)  ?  ? ?Allergies:   Penicillins and Apixaban  ? ?Social History  ? ?Socioeconomic History  ? Marital status: Widowed  ?  Spouse name: [name]  ? Number of children: 4  ? Years of education: 12th  ? Highest education level: Not on file  ?Occupational History  ?  Employer: RETIRED  ?Tobacco Use  ? Smoking status: Former  ?  Packs/day: 1.50  ?  Years: 30.00  ?  Pack years: 45.00  ?  Types: Cigarettes  ?  Quit date: 04/28/2000  ?  Years since quitting: 21.2  ? Smokeless tobacco: Never  ?Vaping Use  ? Vaping Use: Never used  ?Substance and Sexual Activity  ? Alcohol use:  No  ? Drug use: No  ? Sexual activity: Not on file  ?  Comment: hyst  ?Other Topics Concern  ? Not on file  ?Social History Narrative  ? Patient lives at home with spouse.  ? Caffeine Use: 2 cups of coffee daily  ? ?Social Determinants of Health  ? ?Financial Resource Strain: Not on file  ?Food Insecurity: Not on file  ?Transportation Needs: Not on file  ?Physical Activity: Not on file  ?Stress: Not on file  ?Social Connections: Not on file  ?  ? ?Family History: ?The patient's family history includes Cancer in her brother and sister; Colon cancer in her brother; Coronary artery disease in her brother, father, and mother; Diabetes in her son; Emphysema in her brother; Healthy in her son, son, son, and son; Lung cancer in her sister. ? ?ROS:   ?Please see the history of present illness.    ?All other systems reviewed and are negative. ? ?EKGs/Labs/Other Studies Reviewed:   ? ?The following studies were reviewed today: ? ?03/28/2021 Echo ?EF 65% ?Mild LVH ?RV normal ?Mild-mod dilated LA ?Trivial MR ? ? ?EKG:  The ekg ordered today  demonstrates atrial fibrillation with a rapid ventricular rate at 128 bpm. ? ? ?Recent Labs: ?01/11/2021: ALT 10 ?03/27/2021: Magnesium 2.0 ?03/28/2021: TSH 3.023 ?04/24/2021: Hemoglobin 11.8; Platelets 324 ?

## 2021-07-24 ENCOUNTER — Encounter: Payer: Self-pay | Admitting: *Deleted

## 2021-07-24 ENCOUNTER — Ambulatory Visit: Payer: Medicare Other | Admitting: Cardiology

## 2021-07-24 ENCOUNTER — Encounter: Payer: Self-pay | Admitting: Cardiology

## 2021-07-24 VITALS — BP 162/88 | HR 128 | Ht 65.5 in | Wt 159.6 lb

## 2021-07-24 DIAGNOSIS — I25118 Atherosclerotic heart disease of native coronary artery with other forms of angina pectoris: Secondary | ICD-10-CM

## 2021-07-24 DIAGNOSIS — I4891 Unspecified atrial fibrillation: Secondary | ICD-10-CM | POA: Diagnosis not present

## 2021-07-24 DIAGNOSIS — Z01818 Encounter for other preprocedural examination: Secondary | ICD-10-CM | POA: Diagnosis not present

## 2021-07-24 DIAGNOSIS — I4819 Other persistent atrial fibrillation: Secondary | ICD-10-CM

## 2021-07-24 DIAGNOSIS — I10 Essential (primary) hypertension: Secondary | ICD-10-CM | POA: Diagnosis not present

## 2021-07-24 MED ORDER — AMIODARONE HCL 200 MG PO TABS: ORAL_TABLET | ORAL | 3 refills | Status: DC

## 2021-07-24 NOTE — Patient Instructions (Addendum)
Medication Instructions:  ?Start Amiodarone 400 mg two times a day for 5 days, then 400 mg a day for 5 day, then 200 mg daily ?Your physician recommends that you continue on your current medications as directed. Please refer to the Current Medication list given to you today. ?*If you need a refill on your cardiac medications before your next appointment, please call your pharmacy* ? ?Lab Work: ?None. ?If you have labs (blood work) drawn today and your tests are completely normal, you will receive your results only by: ?MyChart Message (if you have MyChart) OR ?A paper copy in the mail ?If you have any lab test that is abnormal or we need to change your treatment, we will call you to review the results. ? ?Testing/Procedures: ?Your physician has recommended that you have a Cardioversion (DCCV). Electrical Cardioversion uses a jolt of electricity to your heart either through paddles or wired patches attached to your chest. This is a controlled, usually prescheduled, procedure. Defibrillation is done under light anesthesia in the hospital, and you usually go home the day of the procedure. This is done to get your heart back into a normal rhythm. You are not awake for the procedure. Please see the instruction sheet given to you today.  ? ?Your physician has requested that you have cardiac CT. Cardiac computed tomography (CT) is a painless test that uses an x-ray machine to take clear, detailed pictures of your heart. For further information please visit [email or web address]. Please follow instruction sheet as given. ? ?Your physician has recommended that you have an ablation. Catheter ablation is a medical procedure used to treat some cardiac arrhythmias (irregular heartbeats). During catheter ablation, a long, thin, flexible tube is put into a blood vessel in your groin (upper thigh), or neck. This tube is called an ablation catheter. It is then guided to your heart through the blood vessel. Radio frequency waves  destroy small areas of heart tissue where abnormal heartbeats may cause an arrhythmia to start. Please see the instruction sheet given to you today.  ? ?Follow-Up: ?At [facility], you and your health needs are our priority.  As part of our continuing mission to provide you with exceptional heart care, we have created designated Provider Care Teams.  These Care Teams include your primary Cardiologist (physician) and Advanced Practice Providers (APPs -  Physician Assistants and Nurse Practitioners) who all work together to provide you with the care you need, when you need it. ? ?Your physician wants you to follow-up in: please see instruction letter.  ? ?We recommend signing up for the patient portal called "MyChart".  Sign up information is provided on this After Visit Summary.  MyChart is used to connect with patients for Virtual Visits (Telemedicine).  Patients are able to view lab/test results, encounter notes, upcoming appointments, etc.  Non-urgent messages can be sent to your provider as well.   ?To learn more about what you can do with MyChart, go to [email or web address].   ? ?Any Other Special Instructions Will Be Listed Below (If Applicable). ? ?Electrical Cardioversion ?Electrical cardioversion is the delivery of a jolt of electricity to restore a normal rhythm to the heart. A rhythm that is too fast or is not regular keeps the heart from pumping well. In this procedure, sticky patches or metal paddles are placed on the chest to deliver electricity to the heart from a device. ?This procedure may be done in an emergency if: ?There is low or no blood pressure as a result of the  heart rhythm. ?Normal rhythm must be restored as fast as possible to protect the brain and heart from further damage. ?It may save a life. ?This may also be a scheduled procedure for irregular or fast heart rhythms that are not immediately life-threatening. ?Tell a health care provider about: ?Any allergies you have. ?All  medicines you are taking, including vitamins, herbs, eye drops, creams, and over-the-counter medicines. ?Any problems you or family members have had with anesthetic medicines. ?Any blood disorders you have. ?Any surgeries you have had. ?Any medical conditions you have. ?Whether you are pregnant or may be pregnant. ?What are the risks? ?Generally, this is a safe procedure. However, problems may occur, including: ?Allergic reactions to medicines. ?A blood clot that breaks free and travels to other parts of your body. ?The possible return of an abnormal heart rhythm within hours or days after the procedure. ?Your heart stopping (cardiac arrest). This is rare. ?What happens before the procedure? ?Medicines ?Your health care provider may have you start taking: ?Blood-thinning medicines (anticoagulants) so your blood does not clot as easily. ?Medicines to help stabilize your heart rate and rhythm. ?Ask your health care provider about: ?Changing or stopping your regular medicines. This is especially important if you are taking diabetes medicines or blood thinners. ?Taking medicines such as aspirin and ibuprofen. These medicines can thin your blood. Do not take these medicines unless your health care provider tells you to take them. ?Taking over-the-counter medicines, vitamins, herbs, and supplements. ?General instructions ?Follow instructions from your health care provider about eating or drinking restrictions. ?Plan to have someone take you home from the hospital or clinic. ?If you will be going home right after the procedure, plan to have someone with you for 24 hours. ?Ask your health care provider what steps will be taken to help prevent infection. These may include washing your skin with a germ-killing soap. ?What happens during the procedure? ? ?An IV will be inserted into one of your veins. ?Sticky patches (electrodes) or metal paddles may be placed on your chest. ?You will be given a medicine to help you relax  (sedative). ?An electrical shock will be delivered. ?The procedure may vary among health care providers and hospitals. ?What can I expect after the procedure? ?Your blood pressure, heart rate, breathing rate, and blood oxygen level will be monitored until you leave the hospital or clinic. ?Your heart rhythm will be watched to make sure it does not change. ?You may have some redness on the skin where the shocks were given. ?Follow these instructions at home: ?Do not drive for 24 hours if you were given a sedative during your procedure. ?Take over-the-counter and prescription medicines only as told by your health care provider. ?Ask your health care provider how to check your pulse. Check it often. ?Rest for 48 hours after the procedure or as told by your health care provider. ?Avoid or limit your caffeine use as told by your health care provider. ?Keep all follow-up visits as told by your health care provider. This is important. ?Contact a health care provider if: ?You feel like your heart is beating too quickly or your pulse is not regular. ?You have a serious muscle cramp that does not go away. ?Get help right away if: ?You have discomfort in your chest. ?You are dizzy or you feel faint. ?You have trouble breathing or you are short of breath. ?Your speech is slurred. ?You have trouble moving an arm or leg on one side of your body. ?Your fingers  or toes turn cold or blue. ?Summary ?Electrical cardioversion is the delivery of a jolt of electricity to restore a normal rhythm to the heart. ?This procedure may be done right away in an emergency or may be a scheduled procedure if the condition is not an emergency. ?Generally, this is a safe procedure. ?After the procedure, check your pulse often as told by your health care provider. ?This information is not intended to replace advice given to you by your health care provider. Make sure you discuss any questions you have with your health care provider. ?Document Revised:  11/15/2018 Document Reviewed: 11/15/2018 ?Elsevier Patient Education ? Long. ? ? ?Cardiac Ablation ?Cardiac ablation is a procedure to destroy (ablate) some heart tissue that is sending bad signals. Thes

## 2021-07-26 ENCOUNTER — Telehealth: Payer: Self-pay | Admitting: Cardiology

## 2021-07-26 ENCOUNTER — Encounter: Payer: Self-pay | Admitting: *Deleted

## 2021-07-26 NOTE — Telephone Encounter (Signed)
Amiodarone can cause pulmonary toxicity and breathing issues. Would need MD input regarding use of other potential antiarrhythmics instead. ?

## 2021-07-26 NOTE — Telephone Encounter (Signed)
?  Pt c/o medication issue: ? ?1. Name of Medication: amiodarone (PACERONE) 200 MG tablet ? ?2. How are you currently taking this medication (dosage and times per day)? Take 2 tablets (400 mg total) by mouth 2 (two) times daily for 5 days, THEN 2 tablets (400 mg total) daily for 5 days, THEN 1 tablet (200 mg total) daily. ? ?3. Are you having a reaction (difficulty breathing--STAT)?  ? ?4. What is your medication issue? Pt said Dr. Quentin Ore prescribed this meds to help her breathing, after taking this meds her breathing is a lot worst and wanted to ask Dr. Quentin Ore if it needs to be change  ? ?

## 2021-07-26 NOTE — Telephone Encounter (Addendum)
Per Dr. Quentin Ore : ?highly unlikely to be amiodarone given it was just started. I am more concerned that her breathing difficulties are related to the atrial arrhythmias. can we get her an appointment with the AF clinic to discuss further and to potentially move up her cardioversion? ? ?Appt made with the Afib Clinic on April 4 at 9:30 am. ED precautions given. Patient verbalized understanding and agreement.  ?

## 2021-07-29 ENCOUNTER — Ambulatory Visit (HOSPITAL_COMMUNITY): Payer: Medicare Other

## 2021-07-30 ENCOUNTER — Encounter (HOSPITAL_COMMUNITY): Payer: Self-pay | Admitting: Physician Assistant

## 2021-07-30 ENCOUNTER — Ambulatory Visit (HOSPITAL_COMMUNITY)
Admission: RE | Admit: 2021-07-30 | Discharge: 2021-07-30 | Disposition: A | Discharge status: Home / Self Care | Payer: Medicare Other | Source: Ambulatory Visit | Attending: Physician Assistant | Admitting: Physician Assistant

## 2021-07-30 VITALS — BP 160/88 | HR 73 | Ht 65.5 in | Wt 158.0 lb

## 2021-07-30 DIAGNOSIS — I10 Essential (primary) hypertension: Secondary | ICD-10-CM | POA: Diagnosis not present

## 2021-07-30 DIAGNOSIS — Z79899 Other long term (current) drug therapy: Secondary | ICD-10-CM | POA: Diagnosis not present

## 2021-07-30 DIAGNOSIS — Z7901 Long term (current) use of anticoagulants: Secondary | ICD-10-CM | POA: Insufficient documentation

## 2021-07-30 DIAGNOSIS — I4819 Other persistent atrial fibrillation: Secondary | ICD-10-CM | POA: Diagnosis present

## 2021-07-30 DIAGNOSIS — D6869 Other thrombophilia: Secondary | ICD-10-CM

## 2021-07-30 DIAGNOSIS — I2510 Atherosclerotic heart disease of native coronary artery without angina pectoris: Secondary | ICD-10-CM | POA: Insufficient documentation

## 2021-07-30 DIAGNOSIS — I272 Pulmonary hypertension, unspecified: Secondary | ICD-10-CM | POA: Insufficient documentation

## 2021-07-30 LAB — BASIC METABOLIC PANEL
Anion gap: 9 (ref 5–15)
BUN: 18 mg/dL (ref 8–23)
CO2: 23 mmol/L (ref 22–32)
Calcium: 9.4 mg/dL (ref 8.9–10.3)
Chloride: 107 mmol/L (ref 98–111)
Creatinine, Ser: 1.28 mg/dL — ABNORMAL HIGH (ref 0.44–1.00)
GFR, Estimated: 42 mL/min — ABNORMAL LOW (ref >60)
Glucose, Bld: 88 mg/dL (ref 70–99)
Potassium: 4.3 mmol/L (ref 3.5–5.1)
Sodium: 139 mmol/L (ref 135–145)

## 2021-07-30 LAB — CBC
HCT: 36.2 % (ref 36.0–46.0)
Hemoglobin: 11.8 g/dL — ABNORMAL LOW (ref 12.0–15.0)
MCH: 30.6 pg (ref 26.0–34.0)
MCHC: 32.6 g/dL (ref 30.0–36.0)
MCV: 94 fL (ref 80.0–100.0)
Platelets: 274 10*3/uL (ref 150–400)
RBC: 3.85 MIL/uL — ABNORMAL LOW (ref 3.87–5.11)
RDW: 13.6 % (ref 11.5–15.5)
WBC: 8.3 10*3/uL (ref 4.0–10.5)
nRBC: 0 % (ref 0.0–0.2)

## 2021-07-30 NOTE — Patient Instructions (Addendum)
Cardioversion scheduled for Tuesday, April 18th ? - Arrive at the [facility] and go to admitting at 8am ? - Do not eat or drink anything after midnight the night prior to your procedure. ? - Take all your morning medication (except diabetic medications) with a sip of water prior to arrival. ? - You will not be able to drive home after your procedure. ? - Do NOT miss any doses of your blood thinner - if you should miss a dose please notify our office immediately. ? - If you feel as if you go back into normal rhythm prior to scheduled cardioversion, please notify our office immediately. If your procedure is canceled in the cardioversion suite you will be charged a cancellation fee. ? ?

## 2021-07-30 NOTE — H&P (View-Only) (Signed)
? ?Primary Care Physician: Pablo Lawrence, NP ?Referring Physician: Dr. Domenic Polite ?Primary EP: Dr Quentin Ore ? ? ?[name] is a 81 y.o. female with a h/o CAD, HTN,  persistent afib, most recent cardioversion with ERAF after starting Multaq. She also had successful  cardioversion earlier in January with ERAF as well. Dr, Domenic Polite referred her to the afib clinic to discuss Tikosyn. She has some lung issues at baseline, including pulmonary HTN so he did not want to use amiodarone. She feels tired in afib but is rate controlled and tolerating ok. ? ?Follow up in the AF clinic 07/30/21. Patient seen by Dr Quentin Ore 07/24/21 and started on amiodarone as a bridge to ablation. She is scheduled for ablation on 10/28/21. Over the weekend she had increased SOB with exertion and feeling "unbalanced" when she walked. She does feel improved today. Her heart rate is much better controlled.  ? ?Today, she denies symptoms of palpitations, chest pain, shortness of breath, orthopnea, PND, lower extremity edema,  presyncope, syncope, or neurologic sequela. The patient is tolerating medications without difficulties and is otherwise without complaint today.  ? ?Past Medical History:  ?Diagnosis Date  ? Anemia   ? Arthritis   ? Breast cancer ([place])   ? Right mastectomy  ? Chronic back pain   ? Coronary atherosclerosis of native coronary artery   ? a. 12/2019 Cath: LM nl, LAD 14m LCX 50p, 80d, RCA 20p, 42m>med rx.  ? Demand [place] ([place]  ? a. 02/2021 HsTrop to 361 in setting of rapid afib; b. 02/2021 Echo: EF 65-70%, no rwma, mild LVH, RVSP 28.2m69m. Mildly to mod dil LA. Triv MR. Ao sclerosis w/o stenosis.  ? Environmental allergies   ? Essential hypertension   ? GERD (gastroesophageal reflux disease)   ? History of bronchitis   ? History of colon polyps   ? History of diverticulosis   ? History of migraine   ? Panic attacks   ? Persistent atrial fibrillation ([facility]/2022  ? a. CHA2DS2VASc = 5-->xarelto.  ? Prolapse of vaginal vault  after hysterectomy   ? Seasonal allergies   ? Urinary frequency   ? ?Past Surgical History:  ?Procedure Laterality Date  ? ABDOMINAL HYSTERECTOMY    ? BACK SURGERY  1981/2011/2014  ? BLADDER SUSPENSION    ? BREAST RECONSTRUCTION  2004  ? with abd tissue  ? BREAST SURGERY Right   ? CARDIOVERSION N/A 04/30/2021  ? Procedure: CARDIOVERSION;  Surgeon: BraArnoldo LenisD;  [facility];  Service: Endoscopy;  Laterality: N/A;  ? CARDIOVERSION N/A 05/16/2021  ? Procedure: CARDIOVERSION;  Surgeon: BraArnoldo LenisD;  [facility];  Service: Endoscopy;  Laterality: N/A;  ? CHOLECYSTECTOMY    ? COLONOSCOPY  12/2009  ? RMR: few pancolonic diverticula. next TCS 12/2014  ? COLONOSCOPY  06/14/2007  ? RMRKDT:OIZTIWPYKDctal polyp, status post cold biopsy removal/ Anal canal hemorrhoids/Left-sided diverticula Colonic mucosa appeared normal.Hyperplastic polyp.   ? COLONOSCOPY  01/18/2004  ? RMRXIP:JASNKNLverticula/Internal hemorrhoids.  Otherwise normal rectum  ? COLONOSCOPY N/A 04/18/2013  ? Dr. RouGala Romneyormal rectum, scattered left sided diverticula the remainder of the colonic mucosa appeared normal  ? COLONOSCOPY N/A 08/18/2014  ? Procedure: COLONOSCOPY;  Surgeon: RobDaneil DolinD;  [facility];  Service: Endoscopy;  Laterality: N/A;  1000  ? ESOPHAGOGASTRODUODENOSCOPY  06/14/2007  ? RMRZJQ:BHALPF-XTKWaques in esophageal mucosa of uncertain significance brushed and biopsied/Normal stomach, normal first duodenum and second duodenoscopy. KOH negative. esophageal bx c/w GERD  ?  ESOPHAGOGASTRODUODENOSCOPY N/A 08/18/2014  ? Procedure: ESOPHAGOGASTRODUODENOSCOPY (EGD);  Surgeon: Daneil Dolin, MD;  [facility];  Service: Endoscopy;  Laterality: N/A;  ? ESOPHAGOGASTRODUODENOSCOPY (EGD) WITH ESOPHAGEAL DILATION N/A 04/18/2013  ? Dr. Gala Romney- normal, patent appearing, tubular esophagus, normal gastric mucosa, paptent pylorus, normal first and second portion of the duodenum  ? EYE SURGERY    ? lasik   ? FOOT SURGERY Right 2014  ? d/t plantar fascitis  ? GIVENS CAPSULE STUDY N/A 07/18/2013  ? L.Lewis PAC- markedly abnormal appearing gastric mucosa, sugnificant bile reflux, questionable subucosal small bowel mass versus extrinsic compression in the distal small bowel.- CTE= no small bowel mass or tumor seen.  ? LEFT HEART CATH AND CORONARY ANGIOGRAPHY N/A 01/16/2020  ? Procedure: LEFT HEART CATH AND CORONARY ANGIOGRAPHY;  Surgeon: Burnell Blanks, MD;  Location: [place] [facility];  Service: Cardiovascular;  Laterality: N/A;  ? MALONEY DILATION N/A 08/18/2014  ? Procedure: MALONEY DILATION;  Surgeon: Daneil Dolin, MD;  [facility];  Service: Endoscopy;  Laterality: N/A;  ? MASTECTOMY    ? right side 12-13 years ago  ? MOUTH SURGERY    ? cyst removed in June 2022.  ? TOTAL HIP ARTHROPLASTY Left 02/29/2016  ? Procedure: TOTAL HIP ARTHROPLASTY ANTERIOR APPROACH;  Surgeon: Frederik Pear, MD;  Location: [place];  Service: Orthopedics;  Laterality: Left;  ? TOTAL HIP ARTHROPLASTY Right 01/05/2017  ? Procedure: TOTAL HIP ARTHROPLASTY ANTERIOR APPROACH;  Surgeon: Frederik Pear, MD;  Location: [place];  Service: Orthopedics;  Laterality: Right;  REQUEST 90 MINS  ? ? ?Current Outpatient Medications  ?Medication Sig Dispense Refill  ? albuterol (VENTOLIN HFA) 108 (90 Base) MCG/ACT inhaler Inhale 1-2 puffs into the lungs every 6 (six) hours as needed for shortness of breath or wheezing.    ? amiodarone (PACERONE) 200 MG tablet Take 2 tablets (400 mg total) by mouth 2 (two) times daily for 5 days, THEN 2 tablets (400 mg total) daily for 5 days, THEN 1 tablet (200 mg total) daily. 90 tablet 3  ? diphenhydrAMINE (BENADRYL) 25 MG tablet Take 25 mg by mouth as needed for allergies.    ? HYDROcodone-acetaminophen (NORCO/VICODIN) 5-325 MG tablet Take 1 tablet by mouth every 8 (eight) hours as needed for moderate pain (back pain.).    ? metoprolol succinate (TOPROL-XL) 100 MG 24 hr tablet Take 0.5 tablets (50 mg total)  by mouth daily. Take with or immediately following a meal. 90 tablet 3  ? nitroGLYCERIN (NITROSTAT) 0.4 MG SL tablet Place 1 tablet (0.4 mg total) under the tongue every 5 (five) minutes as needed for chest pain. 25 tablet 3  ? omeprazole (PRILOSEC) 20 MG capsule Take 20 mg by mouth daily as needed (indigestion/heartburn).    ? rivaroxaban (XARELTO) 20 MG TABS tablet Take 1 tablet (20 mg total) by mouth daily with supper. 30 tablet 3  ? rosuvastatin (CRESTOR) 5 MG tablet TAKE ONE TABLET BY MOUTH DAILY 90 tablet 1  ? ?No current facility-administered medications for this encounter.  ? ? ?Allergies  ?Allergen Reactions  ? Penicillins Anaphylaxis, Swelling and Other (See Comments)  ?  Blisters ?Has patient had a PCN reaction causing immediate rash, facial/tongue/throat swelling, SOB or lightheadedness with hypotension: Yes ?Has patient had a PCN reaction causing severe rash involving mucus membranes or skin necrosis: No ?Has patient had a PCN reaction that required hospitalization No ?Has patient had a PCN reaction occurring within the last 10 years: No ?If all of  the above answers are "NO", then may proceed with Cephalosporin use. ?  ? Apixaban Hives  ? ? ?Social History  ? ?Socioeconomic History  ? Marital status: Widowed  ?  Spouse name: [name]  ? Number of children: 4  ? Years of education: 12th  ? Highest education level: Not on file  ?Occupational History  ?  Employer: RETIRED  ?Tobacco Use  ? Smoking status: Former  ?  Packs/day: 1.50  ?  Years: 30.00  ?  Pack years: 45.00  ?  Types: Cigarettes  ?  Quit date: 04/28/2000  ?  Years since quitting: 21.2  ? Smokeless tobacco: Never  ? Tobacco comments:  ?  Former smoker 07/30/21  ?Vaping Use  ? Vaping Use: Never used  ?Substance and Sexual Activity  ? Alcohol use: No  ? Drug use: No  ? Sexual activity: Not on file  ?  Comment: hyst  ?Other Topics Concern  ? Not on file  ?Social History Narrative  ? Patient lives at home with spouse.  ? Caffeine Use: 2 cups of coffee  daily  ? ?Social Determinants of Health  ? ?Financial Resource Strain: Not on file  ?Food Insecurity: Not on file  ?Transportation Needs: Not on file  ?Physical Activity: Not on file  ?Stress: Not on fi

## 2021-07-30 NOTE — Progress Notes (Addendum)
? ?Primary Care Physician: Pablo Lawrence, NP ?Referring Physician: Dr. Domenic Polite ?Primary EP: Dr Quentin Ore ? ? ?[name] is a 81 y.o. female with a h/o CAD, HTN,  persistent afib, most recent cardioversion with ERAF after starting Multaq. She also had successful  cardioversion earlier in January with ERAF as well. Dr, Domenic Polite referred her to the afib clinic to discuss Tikosyn. She has some lung issues at baseline, including pulmonary HTN so he did not want to use amiodarone. She feels tired in afib but is rate controlled and tolerating ok. ? ?Follow up in the AF clinic 07/30/21. Patient seen by Dr Quentin Ore 07/24/21 and started on amiodarone as a bridge to ablation. She is scheduled for ablation on 10/28/21. Over the weekend she had increased SOB with exertion and feeling "unbalanced" when she walked. She does feel improved today. Her heart rate is much better controlled.  ? ?Today, she denies symptoms of palpitations, chest pain, shortness of breath, orthopnea, PND, lower extremity edema,  presyncope, syncope, or neurologic sequela. The patient is tolerating medications without difficulties and is otherwise without complaint today.  ? ?Past Medical History:  ?Diagnosis Date  ? Anemia   ? Arthritis   ? Breast cancer ([place])   ? Right mastectomy  ? Chronic back pain   ? Coronary atherosclerosis of native coronary artery   ? a. 12/2019 Cath: LM nl, LAD 814m LCX 50p, 80d, RCA 20p, 465m>med rx.  ? [place]and [place] ([place]  ? a. 02/2021 HsTrop to 361 in setting of rapid afib; b. 02/2021 Echo: EF 65-70%, no rwma, mild LVH, RVSP 28.14m8m. Mildly to mod dil LA. Triv MR. Ao sclerosis w/o stenosis.  ? Environmental allergies   ? Essential hypertension   ? GERD (gastroesophageal reflux disease)   ? History of bronchitis   ? History of colon polyps   ? History of diverticulosis   ? History of migraine   ? Panic attacks   ? Persistent atrial fibrillation ([place]/2022  ? a. CHA2DS2VASc = 5-->xarelto.  ? Prolapse of vaginal vault  after hysterectomy   ? Seasonal allergies   ? Urinary frequency   ? ?Past Surgical History:  ?Procedure Laterality Date  ? ABDOMINAL HYSTERECTOMY    ? BACK SURGERY  1981/2011/2014  ? BLADDER SUSPENSION    ? BREAST RECONSTRUCTION  2004  ? with abd tissue  ? BREAST SURGERY Right   ? CARDIOVERSION N/A 04/30/2021  ? Procedure: CARDIOVERSION;  Surgeon: BraArnoldo LenisD;  [facility];  Service: Endoscopy;  Laterality: N/A;  ? CARDIOVERSION N/A 05/16/2021  ? Procedure: CARDIOVERSION;  Surgeon: BraArnoldo LenisD;  [facility];  Service: Endoscopy;  Laterality: N/A;  ? CHOLECYSTECTOMY    ? COLONOSCOPY  12/2009  ? RMR: few pancolonic diverticula. next TCS 12/2014  ? COLONOSCOPY  06/14/2007  ? RMRLFY:BOFBPZWCHEctal polyp, status post cold biopsy removal/ Anal canal hemorrhoids/Left-sided diverticula Colonic mucosa appeared normal.Hyperplastic polyp.   ? COLONOSCOPY  01/18/2004  ? RMRNID:[id number]/Internal hemorrhoids.  Otherwise normal rectum  ? COLONOSCOPY N/A 04/18/2013  ? Dr. RouGala Romneyormal rectum, scattered left sided diverticula the remainder of the colonic mucosa appeared normal  ? COLONOSCOPY N/A 08/18/2014  ? Procedure: COLONOSCOPY;  Surgeon: RobDaneil DolinD;  [facility];  Service: Endoscopy;  Laterality: N/A;  1000  ? ESOPHAGOGASTRODUODENOSCOPY  06/14/2007  ? RMRTIR:WERXVQ-MGQQaques in esophageal mucosa of uncertain significance brushed and biopsied/Normal stomach, normal first duodenum and second duodenoscopy. KOH negative. esophageal bx c/w GERD  ?  ESOPHAGOGASTRODUODENOSCOPY N/A 08/18/2014  ? Procedure: ESOPHAGOGASTRODUODENOSCOPY (EGD);  Surgeon: Daneil Dolin, MD;  [facility];  Service: Endoscopy;  Laterality: N/A;  ? ESOPHAGOGASTRODUODENOSCOPY (EGD) WITH ESOPHAGEAL DILATION N/A 04/18/2013  ? Dr. Gala Romney- normal, patent appearing, tubular esophagus, normal gastric mucosa, paptent pylorus, normal first and second portion of the duodenum  ? EYE SURGERY    ? lasik   ? FOOT SURGERY Right 2014  ? d/t plantar fascitis  ? GIVENS CAPSULE STUDY N/A 07/18/2013  ? L.Lewis PAC- markedly abnormal appearing gastric mucosa, sugnificant bile reflux, questionable subucosal small bowel mass versus extrinsic compression in the distal small bowel.- CTE= no small bowel mass or tumor seen.  ? LEFT HEART CATH AND CORONARY ANGIOGRAPHY N/A 01/16/2020  ? Procedure: LEFT HEART CATH AND CORONARY ANGIOGRAPHY;  Surgeon: Burnell Blanks, MD;  Location: [place] [facility];  Service: Cardiovascular;  Laterality: N/A;  ? MALONEY DILATION N/A 08/18/2014  ? Procedure: MALONEY DILATION;  Surgeon: Daneil Dolin, MD;  [facility];  Service: Endoscopy;  Laterality: N/A;  ? MASTECTOMY    ? right side 12-13 years ago  ? MOUTH SURGERY    ? cyst removed in June 2022.  ? TOTAL HIP ARTHROPLASTY Left 02/29/2016  ? Procedure: TOTAL HIP ARTHROPLASTY ANTERIOR APPROACH;  Surgeon: Frederik Pear, MD;  Location: [place];  Service: Orthopedics;  Laterality: Left;  ? TOTAL HIP ARTHROPLASTY Right 01/05/2017  ? Procedure: TOTAL HIP ARTHROPLASTY ANTERIOR APPROACH;  Surgeon: Frederik Pear, MD;  Location: [place];  Service: Orthopedics;  Laterality: Right;  REQUEST 90 MINS  ? ? ?Current Outpatient Medications  ?Medication Sig Dispense Refill  ? albuterol (VENTOLIN HFA) 108 (90 Base) MCG/ACT inhaler Inhale 1-2 puffs into the lungs every 6 (six) hours as needed for shortness of breath or wheezing.    ? amiodarone (PACERONE) 200 MG tablet Take 2 tablets (400 mg total) by mouth 2 (two) times daily for 5 days, THEN 2 tablets (400 mg total) daily for 5 days, THEN 1 tablet (200 mg total) daily. 90 tablet 3  ? diphenhydrAMINE (BENADRYL) 25 MG tablet Take 25 mg by mouth as needed for allergies.    ? HYDROcodone-acetaminophen (NORCO/VICODIN) 5-325 MG tablet Take 1 tablet by mouth every 8 (eight) hours as needed for moderate pain (back pain.).    ? metoprolol succinate (TOPROL-XL) 100 MG 24 hr tablet Take 0.5 tablets (50 mg total)  by mouth daily. Take with or immediately following a meal. 90 tablet 3  ? nitroGLYCERIN (NITROSTAT) 0.4 MG SL tablet Place 1 tablet (0.4 mg total) under the tongue every 5 (five) minutes as needed for chest pain. 25 tablet 3  ? omeprazole (PRILOSEC) 20 MG capsule Take 20 mg by mouth daily as needed (indigestion/heartburn).    ? rivaroxaban (XARELTO) 20 MG TABS tablet Take 1 tablet (20 mg total) by mouth daily with supper. 30 tablet 3  ? rosuvastatin (CRESTOR) 5 MG tablet TAKE ONE TABLET BY MOUTH DAILY 90 tablet 1  ? ?No current facility-administered medications for this encounter.  ? ? ?Allergies  ?Allergen Reactions  ? Penicillins Anaphylaxis, Swelling and Other (See Comments)  ?  Blisters ?Has patient had a PCN reaction causing immediate rash, facial/tongue/throat swelling, SOB or lightheadedness with hypotension: Yes ?Has patient had a PCN reaction causing severe rash involving mucus membranes or skin necrosis: No ?Has patient had a PCN reaction that required hospitalization No ?Has patient had a PCN reaction occurring within the last 10 years: No ?If all of  the above answers are "NO", then may proceed with Cephalosporin use. ?  ? Apixaban Hives  ? ? ?Social History  ? ?Socioeconomic History  ? Marital status: Widowed  ?  Spouse name: [name]  ? Number of children: 4  ? Years of education: 12th  ? Highest education level: Not on file  ?Occupational History  ?  Employer: RETIRED  ?Tobacco Use  ? Smoking status: Former  ?  Packs/day: 1.50  ?  Years: 30.00  ?  Pack years: 45.00  ?  Types: Cigarettes  ?  Quit date: 04/28/2000  ?  Years since quitting: 21.2  ? Smokeless tobacco: Never  ? Tobacco comments:  ?  Former smoker 07/30/21  ?Vaping Use  ? Vaping Use: Never used  ?Substance and Sexual Activity  ? Alcohol use: No  ? Drug use: No  ? Sexual activity: Not on file  ?  Comment: hyst  ?Other Topics Concern  ? Not on file  ?Social History Narrative  ? Patient lives at home with spouse.  ? Caffeine Use: 2 cups of coffee  daily  ? ?Social Determinants of Health  ? ?Financial Resource Strain: Not on file  ?Food Insecurity: Not on file  ?Transportation Needs: Not on file  ?Physical Activity: Not on file  ?Stress: Not on fi

## 2021-07-31 ENCOUNTER — Telehealth: Payer: Self-pay

## 2021-07-31 ENCOUNTER — Ambulatory Visit (HOSPITAL_COMMUNITY)
Admission: RE | Admit: 2021-07-31 | Discharge: 2021-07-31 | Disposition: A | Discharge status: Home / Self Care | Payer: Medicare Other | Source: Ambulatory Visit | Attending: Adult Health Nurse Practitioner | Admitting: Adult Health Nurse Practitioner

## 2021-07-31 DIAGNOSIS — I4819 Other persistent atrial fibrillation: Secondary | ICD-10-CM

## 2021-07-31 DIAGNOSIS — Z1231 Encounter for screening mammogram for malignant neoplasm of breast: Secondary | ICD-10-CM | POA: Insufficient documentation

## 2021-07-31 NOTE — Telephone Encounter (Signed)
Outreach made to Pt. ? ?Pt was given instruction letters for afib ablation/ct at her appt with afib clinic yesterday. ? ?Discussed getting lab work on June 12-will change to [facility] for [facility] convenience. ? ?Discussed CT instructions.   ? ?Discussed ablation instructions. ? ?Pt indicates understanding. ? ?She will call if any further questions. ?

## 2021-08-12 NOTE — Anesthesia Preprocedure Evaluation (Addendum)
Anesthesia Evaluation  ?Patient identified by MRN, date of birth, ID band ?Patient awake ? ? ? ?Reviewed: ?Allergy & Precautions, NPO status , Patient's Chart, lab work & pertinent test results ? ?Airway ?Mallampati: III ? ?TM Distance: >3 FB ?Neck ROM: Full ? ?Mouth opening: Limited Mouth Opening ? Dental ? ?(+) Edentulous Upper, Dental Advisory Given ?  ?Pulmonary ?asthma , former smoker,  ?  ?Pulmonary exam normal ?breath sounds clear to auscultation ? ? ? ? ? ? Cardiovascular ?hypertension, Pt. on medications ?+ CAD  ?Normal cardiovascular exam+ dysrhythmias Atrial Fibrillation  ?Rhythm:Irregular Rate:Normal ? ?TTE 2022 ?1. Left ventricular ejection fraction, by estimation, is 65 to 70%. The  ?left ventricle has normal function. The left ventricle has no regional  ?wall motion abnormalities. There is mild left ventricular hypertrophy.  ?Left ventricular diastolic parameters  ?are indeterminate.  ??2. Right ventricular systolic function is normal. The right ventricular  ?size is normal. There is normal pulmonary artery systolic pressure. The  ?estimated right ventricular systolic pressure is 05.3 mmHg.  ??3. Left atrial size was mild to moderately dilated.  ??4. There is a trivial pericardial effusion posterior to the left  ?ventricle.  ??5. The mitral valve is grossly normal. Trivial mitral valve  ?regurgitation.  ??6. The aortic valve is tricuspid. Aortic valve regurgitation is not  ?visualized. Aortic valve sclerosis is present, with no evidence of aortic  ?valve stenosis. Aortic valve mean gradient measures 3.5 mmHg.  ??7. The inferior vena cava is normal in size with greater than 50%  ?respiratory variability, suggesting right atrial pressure of 3 mmHg.  ? ?Cath 2021 ?1. Moderate stenosis mid Circumflex. This lesion is not flow limiting by functional assessment (DFR 0.96). The small caliber distal Circumflex has a severe stenosis but this vessel appears to be too small for  PCI (1.75 mm vessel).  ?2. Mild plaque in the LAD ?3. The RCA is a large dominant vessel with mild to moderate calcified plaque in the mid and distal vessel.  ? ?  ?Neuro/Psych ?PSYCHIATRIC DISORDERS Anxiety negative neurological ROS ?   ? GI/Hepatic ?Neg liver ROS, GERD  ,  ?Endo/Other  ?negative endocrine ROS ? Renal/GU ?negative Renal ROS  ?negative genitourinary ?  ?Musculoskeletal ? ?(+) Arthritis ,  ? Abdominal ?  ?Peds ? Hematology ?negative hematology ROS ?(+)   ?Anesthesia Other Findings ? ? Reproductive/Obstetrics ? ?  ? ? ? ? ? ? ? ? ? ? ? ? ? ?  ?  ? ? ? ? ? ? ? ?Anesthesia Physical ?Anesthesia Plan ? ?ASA: 3 ? ?Anesthesia Plan: General  ? ?Post-op Pain Management:   ? ?Induction: Intravenous ? ?PONV Risk Score and Plan: 3 and Propofol infusion and Treatment may vary due to age or medical condition ? ?Airway Management Planned: Natural Airway ? ?Additional Equipment:  ? ?Intra-op Plan:  ? ?Post-operative Plan:  ? ?Informed Consent: I have reviewed the patients History and Physical, chart, labs and discussed the procedure including the risks, benefits and alternatives for the proposed anesthesia with the patient or authorized representative who has indicated his/her understanding and acceptance.  ? ? ? ?Dental advisory given ? ?Plan Discussed with: CRNA ? ?Anesthesia Plan Comments:   ? ? ? ? ? ?Anesthesia Quick Evaluation ? ?

## 2021-08-13 ENCOUNTER — Ambulatory Visit (HOSPITAL_COMMUNITY): Payer: Medicare Other | Admitting: Anesthesiology

## 2021-08-13 ENCOUNTER — Other Ambulatory Visit: Payer: Self-pay

## 2021-08-13 ENCOUNTER — Ambulatory Visit (HOSPITAL_COMMUNITY)
Admission: RE | Admit: 2021-08-13 | Discharge: 2021-08-13 | Disposition: A | Discharge status: Home / Self Care | Payer: Medicare Other | Attending: Cardiology | Admitting: Cardiology

## 2021-08-13 ENCOUNTER — Encounter (HOSPITAL_COMMUNITY): Payer: Self-pay | Admitting: Cardiology

## 2021-08-13 ENCOUNTER — Ambulatory Visit (HOSPITAL_BASED_OUTPATIENT_CLINIC_OR_DEPARTMENT_OTHER): Payer: Medicare Other | Admitting: Anesthesiology

## 2021-08-13 ENCOUNTER — Encounter (HOSPITAL_COMMUNITY)
Admission: RE | Disposition: A | Discharge status: Home / Self Care | Payer: Self-pay | Source: Home / Self Care | Attending: Cardiology

## 2021-08-13 DIAGNOSIS — Z87891 Personal history of nicotine dependence: Secondary | ICD-10-CM

## 2021-08-13 DIAGNOSIS — I4891 Unspecified atrial fibrillation: Secondary | ICD-10-CM

## 2021-08-13 DIAGNOSIS — M1990 Unspecified osteoarthritis, unspecified site: Secondary | ICD-10-CM | POA: Insufficient documentation

## 2021-08-13 DIAGNOSIS — K219 Gastro-esophageal reflux disease without esophagitis: Secondary | ICD-10-CM | POA: Insufficient documentation

## 2021-08-13 DIAGNOSIS — I10 Essential (primary) hypertension: Secondary | ICD-10-CM | POA: Diagnosis not present

## 2021-08-13 DIAGNOSIS — I4819 Other persistent atrial fibrillation: Secondary | ICD-10-CM | POA: Insufficient documentation

## 2021-08-13 DIAGNOSIS — F419 Anxiety disorder, unspecified: Secondary | ICD-10-CM

## 2021-08-13 DIAGNOSIS — J45909 Unspecified asthma, uncomplicated: Secondary | ICD-10-CM | POA: Diagnosis not present

## 2021-08-13 DIAGNOSIS — I2510 Atherosclerotic heart disease of native coronary artery without angina pectoris: Secondary | ICD-10-CM | POA: Insufficient documentation

## 2021-08-13 HISTORY — PX: CARDIOVERSION: SHX1299

## 2021-08-13 SURGERY — CARDIOVERSION
Anesthesia: General

## 2021-08-13 MED ORDER — PROPOFOL 10 MG/ML IV BOLUS
INTRAVENOUS | Status: DC | PRN
  Administered 2021-08-13: 50 mg via INTRAVENOUS

## 2021-08-13 MED ORDER — LIDOCAINE 2% (20 MG/ML) 5 ML SYRINGE
INTRAMUSCULAR | Status: DC | PRN
  Administered 2021-08-13: 60 mg via INTRAVENOUS

## 2021-08-13 MED ORDER — SODIUM CHLORIDE 0.9 % IV SOLN: INTRAVENOUS | Status: DC

## 2021-08-13 NOTE — Interval H&P Note (Signed)
History and Physical Interval Note: ? ?08/13/2021 ?8:18 AM ? ?[name]  has presented today for surgery, with the diagnosis of AFIB.  The various methods of treatment have been discussed with the patient and family. After consideration of risks, benefits and other options for treatment, the patient has consented to  Procedure(s): ?CARDIOVERSION (N/A) as a surgical intervention.  The patient's history has been reviewed, patient examined, no change in status, stable for surgery.  I have reviewed the patient's chart and labs.  Questions were answered to the patient's satisfaction.   ? ? ?Donato Heinz ? ? ?

## 2021-08-13 NOTE — Transfer of Care (Signed)
Immediate Anesthesia Transfer of Care Note ? ?Patient: [name] ? ?Procedure(s) Performed: CARDIOVERSION ? ?Patient Location: Endoscopy Unit ? ?Anesthesia Type:General ? ?Level of Consciousness: drowsy and patient cooperative ? ?Airway & Oxygen Therapy: Patient Spontanous Breathing ? ?Post-op Assessment: Report given to RN and Post -op Vital signs reviewed and stable ? ?Post vital signs: Reviewed and stable ? ?Last Vitals:  ?Vitals Value Taken Time  ?BP 130/44   ?Temp    ?Pulse 42   ?Resp 15   ?SpO2 97   ? ? ?Last Pain:  ?Vitals:  ? 08/13/21 0714  ?TempSrc: Temporal  ?PainSc: 0-No pain  ?   ? ?  ? ?Complications: No notable events documented. ?

## 2021-08-13 NOTE — CV Procedure (Signed)
Procedure:   DCCV ? ?Indication:  Symptomatic atrial fibrillation ? ?Procedure Note:  The patient signed informed consent.  They have had had therapeutic anticoagulation with Xarelto greater than 3 weeks.  Anesthesia was administered by Dr. Renee Harder and Vickii Penna, CRNA.  Adequate airway was maintained throughout and vital followed per protocol.  They were cardioverted x 1 with 200J of biphasic synchronized energy.  They converted to NSR with rate 40s.  There were no apparent complications.  The patient had normal neuro status and respiratory status post procedure with vitals stable as recorded elsewhere.   ? ?HR in low 40s post cardioversion.  On toprol XL 25 mg daily and amiodarone 200 mg daily.  Will continue amiodarone but hold toprol XL. ? ?Follow up:  They will continue on current medical therapy and follow up with cardiology as scheduled. ? ?Oswaldo Milian, MD ?08/13/2021 ?8:26 AM  ? ?

## 2021-08-13 NOTE — Anesthesia Postprocedure Evaluation (Signed)
Anesthesia Post Note ? ?Patient: [name] ? ?Procedure(s) Performed: CARDIOVERSION ? ?  ? ?Patient location during evaluation: Endoscopy ?Anesthesia Type: General ?Level of consciousness: awake and alert ?Pain management: pain level controlled ?Vital Signs Assessment: post-procedure vital signs reviewed and stable ?Respiratory status: spontaneous breathing, nonlabored ventilation, respiratory function stable and patient connected to nasal cannula oxygen ?Cardiovascular status: blood pressure returned to baseline and stable ?Postop Assessment: no apparent nausea or vomiting ?Anesthetic complications: no ? ? ?No notable events documented. ? ?Last Vitals:  ?Vitals:  ? 08/13/21 0840 08/13/21 0850  ?BP: (!) 99/39 (!) 109/41  ?Pulse: (!) 41 (S) (!) 41  ?Resp: 13 13  ?Temp:    ?SpO2: 97% 97%  ?  ?Last Pain:  ?Vitals:  ? 08/13/21 0850  ?TempSrc:   ?PainSc: 0-No pain  ? ? ?  ?  ?  ?  ?  ?  ? ?Marco Raper L Purvi Ruehl ? ? ? ? ?

## 2021-08-13 NOTE — Discharge Instructions (Signed)

## 2021-08-14 ENCOUNTER — Encounter (HOSPITAL_COMMUNITY): Payer: Self-pay | Admitting: Cardiology

## 2021-08-20 ENCOUNTER — Ambulatory Visit (HOSPITAL_COMMUNITY)
Admission: RE | Admit: 2021-08-20 | Discharge: 2021-08-20 | Disposition: A | Discharge status: Home / Self Care | Payer: Medicare Other | Source: Ambulatory Visit | Attending: Physician Assistant | Admitting: Physician Assistant

## 2021-08-20 ENCOUNTER — Encounter (HOSPITAL_COMMUNITY): Payer: Self-pay | Admitting: Physician Assistant

## 2021-08-20 VITALS — BP 162/82 | HR 123 | Ht 65.0 in | Wt 156.8 lb

## 2021-08-20 DIAGNOSIS — I4819 Other persistent atrial fibrillation: Secondary | ICD-10-CM

## 2021-08-20 DIAGNOSIS — Z79899 Other long term (current) drug therapy: Secondary | ICD-10-CM | POA: Diagnosis not present

## 2021-08-20 DIAGNOSIS — Z7901 Long term (current) use of anticoagulants: Secondary | ICD-10-CM | POA: Diagnosis not present

## 2021-08-20 DIAGNOSIS — I2510 Atherosclerotic heart disease of native coronary artery without angina pectoris: Secondary | ICD-10-CM | POA: Diagnosis not present

## 2021-08-20 DIAGNOSIS — D6869 Other thrombophilia: Secondary | ICD-10-CM | POA: Diagnosis not present

## 2021-08-20 DIAGNOSIS — I10 Essential (primary) hypertension: Secondary | ICD-10-CM | POA: Insufficient documentation

## 2021-08-20 MED ORDER — METOPROLOL SUCCINATE ER 100 MG PO TB24: ORAL_TABLET | ORAL | 11 refills | Status: DC

## 2021-08-20 NOTE — Progress Notes (Signed)
? ?Primary Care Physician: Pablo Lawrence, NP ?Referring Physician: Dr. Domenic Polite ?Primary EP: Dr Quentin Ore ? ? ?[name] is a 81 y.o. female with a h/o CAD, HTN,  persistent afib, most recent cardioversion with ERAF after starting Multaq. She also had successful  cardioversion earlier in January with ERAF as well. Dr, Domenic Polite referred her to the afib clinic to discuss Tikosyn. She has some lung issues at baseline, including pulmonary HTN so he did not want to use amiodarone. She feels tired in afib but is rate controlled and tolerating ok. ? ?Patient seen by Dr Quentin Ore 07/24/21 and started on amiodarone as a bridge to ablation. She is scheduled for ablation on 10/28/21.  ? ?On follow up today, patient is s/p DCCV 08/13/21. Her Toprol was held for bradycardia afterwards. Unfortunately, she was back in rapid afib the next morning with palpitations and fatigue. She is in afib today. No bleeding issues with anticoagulation.  ? ?Today, she denies symptoms of chest pain, shortness of breath, orthopnea, PND, lower extremity edema,  presyncope, syncope, or neurologic sequela. The patient is tolerating medications without difficulties and is otherwise without complaint today.  ? ?Past Medical History:  ?Diagnosis Date  ? Anemia   ? Arthritis   ? Breast cancer ([place])   ? Right mastectomy  ? Chronic back pain   ? Coronary atherosclerosis of native coronary artery   ? a. 12/2019 Cath: LM nl, LAD 105m LCX 50p, 80d, RCA 20p, 462m>med rx.  ? Demand ischemia ([place]  ? a. 02/2021 HsTrop to 361 in setting of rapid afib; b. 02/2021 Echo: EF 65-70%, no rwma, mild LVH, RVSP 28.49m149m. Mildly to mod dil LA. Triv MR. Ao sclerosis w/o stenosis.  ? Environmental allergies   ? Essential hypertension   ? GERD (gastroesophageal reflux disease)   ? History of bronchitis   ? History of colon polyps   ? History of diverticulosis   ? History of migraine   ? Panic attacks   ? Persistent atrial fibrillation ([facility]/2022  ? a. CHA2DS2VASc =  5-->xarelto.  ? Prolapse of vaginal vault after hysterectomy   ? Seasonal allergies   ? Urinary frequency   ? ?Past Surgical History:  ?Procedure Laterality Date  ? ABDOMINAL HYSTERECTOMY    ? BACK SURGERY  1981/2011/2014  ? BLADDER SUSPENSION    ? BREAST RECONSTRUCTION  2004  ? with abd tissue  ? BREAST SURGERY Right   ? CARDIOVERSION N/A 04/30/2021  ? Procedure: CARDIOVERSION;  Surgeon: BraArnoldo LenisD;  [facility];  Service: Endoscopy;  Laterality: N/A;  ? CARDIOVERSION N/A 05/16/2021  ? Procedure: CARDIOVERSION;  Surgeon: BraArnoldo LenisD;  [facility]: [facility];  Service: Endoscopy;  Laterality: N/A;  ? CARDIOVERSION N/A 08/13/2021  ? Procedure: CARDIOVERSION;  Surgeon: SchDonato HeinzD;  [facility];  Service: Cardiovascular;  Laterality: N/A;  ? CHOLECYSTECTOMY    ? COLONOSCOPY  12/2009  ? RMR: few pancolonic diverticula. next TCS 12/2014  ? COLONOSCOPY  06/14/2007  ? RMRCHE:NIDPOEUMPNctal polyp, status post cold biopsy removal/ Anal canal hemorrhoids/Left-sided diverticula Colonic mucosa appeared normal.Hyperplastic polyp.   ? COLONOSCOPY  01/18/2004  ? RMRTIR:WERXVQMverticula/Internal hemorrhoids.  Otherwise normal rectum  ? COLONOSCOPY N/A 04/18/2013  ? Dr. RouGala Romneyormal rectum, scattered left sided diverticula the remainder of the colonic mucosa appeared normal  ? COLONOSCOPY N/A 08/18/2014  ? Procedure: COLONOSCOPY;  Surgeon: RobDaneil DolinD;  [facility];  Service: Endoscopy;  Laterality: N/A;  1000  ? ESOPHAGOGASTRODUODENOSCOPY  06/14/2007  ? ZGY:FVCBSW-HQPR plaques in esophageal mucosa of uncertain significance brushed and biopsied/Normal stomach, normal first duodenum and second duodenoscopy. KOH negative. esophageal bx c/w GERD  ? ESOPHAGOGASTRODUODENOSCOPY N/A 08/18/2014  ? Procedure: ESOPHAGOGASTRODUODENOSCOPY (EGD);  Surgeon: Daneil Dolin, MD;  [facility];  Service: Endoscopy;  Laterality: N/A;  ? ESOPHAGOGASTRODUODENOSCOPY (EGD) WITH  ESOPHAGEAL DILATION N/A 04/18/2013  ? Dr. Gala Romney- normal, patent appearing, tubular esophagus, normal gastric mucosa, paptent pylorus, normal first and second portion of the duodenum  ? EYE SURGERY    ? lasik  ? FOOT SURGERY Right 2014  ? d/t plantar fascitis  ? GIVENS CAPSULE STUDY N/A 07/18/2013  ? L.Lewis PAC- markedly abnormal appearing gastric mucosa, sugnificant bile reflux, questionable subucosal small bowel mass versus extrinsic compression in the distal small bowel.- CTE= no small bowel mass or tumor seen.  ? LEFT HEART CATH AND CORONARY ANGIOGRAPHY N/A 01/16/2020  ? Procedure: LEFT HEART CATH AND CORONARY ANGIOGRAPHY;  Surgeon: Burnell Blanks, MD;  Location: [place] [facility];  Service: Cardiovascular;  Laterality: N/A;  ? MALONEY DILATION N/A 08/18/2014  ? Procedure: MALONEY DILATION;  Surgeon: Daneil Dolin, MD;  [facility];  Service: Endoscopy;  Laterality: N/A;  ? MASTECTOMY    ? right side 12-13 years ago  ? MOUTH SURGERY    ? cyst removed in June 2022.  ? TOTAL HIP ARTHROPLASTY Left 02/29/2016  ? Procedure: TOTAL HIP ARTHROPLASTY ANTERIOR APPROACH;  Surgeon: Frederik Pear, MD;  Location: [place];  Service: Orthopedics;  Laterality: Left;  ? TOTAL HIP ARTHROPLASTY Right 01/05/2017  ? Procedure: TOTAL HIP ARTHROPLASTY ANTERIOR APPROACH;  Surgeon: Frederik Pear, MD;  Location: [place];  Service: Orthopedics;  Laterality: Right;  REQUEST 90 MINS  ? ? ?Current Outpatient Medications  ?Medication Sig Dispense Refill  ? albuterol (VENTOLIN HFA) 108 (90 Base) MCG/ACT inhaler Inhale 1-2 puffs into the lungs every 6 (six) hours as needed for shortness of breath or wheezing.    ? amiodarone (PACERONE) 200 MG tablet Take 2 tablets (400 mg total) by mouth 2 (two) times daily for 5 days, THEN 2 tablets (400 mg total) daily for 5 days, THEN 1 tablet (200 mg total) daily. 90 tablet 3  ? diphenhydrAMINE (BENADRYL) 25 MG tablet Take 25 mg by mouth as needed for allergies.    ? docusate sodium (COLACE)  100 MG capsule Take 100 mg by mouth daily as needed for mild constipation.    ? HYDROcodone-acetaminophen (NORCO/VICODIN) 5-325 MG tablet Take 1 tablet by mouth every 8 (eight) hours as needed for moderate pain (back pain.).    ? metoprolol succinate (TOPROL XL) 100 MG 24 hr tablet Take 0.5 daily with or immediately following a meal. 30 tablet 11  ? nitroGLYCERIN (NITROSTAT) 0.4 MG SL tablet Place 1 tablet (0.4 mg total) under the tongue every 5 (five) minutes as needed for chest pain. 25 tablet 3  ? omeprazole (PRILOSEC) 20 MG capsule Take 20 mg by mouth daily as needed (indigestion/heartburn).    ? rivaroxaban (XARELTO) 20 MG TABS tablet Take 1 tablet (20 mg total) by mouth daily with supper. 30 tablet 3  ? rosuvastatin (CRESTOR) 5 MG tablet TAKE ONE TABLET BY MOUTH DAILY 90 tablet 1  ? ?No current facility-administered medications for this encounter.  ? ? ?Allergies  ?Allergen Reactions  ? Penicillins Anaphylaxis, Swelling and Other (See Comments)  ?  Blisters ?Has patient had a PCN reaction causing immediate rash, facial/tongue/throat  swelling, SOB or lightheadedness with hypotension: Yes ?Has patient had a PCN reaction causing severe rash involving mucus membranes or skin necrosis: No ?Has patient had a PCN reaction that required hospitalization No ?Has patient had a PCN reaction occurring within the last 10 years: No ?If all of the above answers are "NO", then may proceed with Cephalosporin use. ?  ? Apixaban Hives  ? ? ?Social History  ? ?Socioeconomic History  ? Marital status: Widowed  ?  Spouse name: [name]  ? Number of children: 4  ? Years of education: 12th  ? Highest education level: Not on file  ?Occupational History  ?  Employer: RETIRED  ?Tobacco Use  ? Smoking status: Former  ?  Packs/day: 1.50  ?  Years: 30.00  ?  Pack years: 45.00  ?  Types: Cigarettes  ?  Quit date: 04/28/2000  ?  Years since quitting: 21.3  ? Smokeless tobacco: Never  ? Tobacco comments:  ?  Former smoker 07/30/21  ?Vaping Use  ?  Vaping Use: Never used  ?Substance and Sexual Activity  ? Alcohol use: No  ? Drug use: No  ? Sexual activity: Not on file  ?  Comment: hyst  ?Other Topics Concern  ? Not on file  ?Social History Narrative

## 2021-08-20 NOTE — Patient Instructions (Signed)
Restart Metoprolol '50mg'$  daily  ?

## 2021-08-23 DIAGNOSIS — I4891 Unspecified atrial fibrillation: Secondary | ICD-10-CM

## 2021-09-08 NOTE — Progress Notes (Signed)
? ? ?Cardiology Office Note ? ?Date: 09/09/2021  ? ?ID: [name], DOB 10-23-1940, MRN [id number] ? ?PCP:  Pablo Lawrence, NP  ?Cardiologist:  Rozann Lesches, MD ?Electrophysiologist:  Vickie Epley, MD  ? ?Chief Complaint  ?Patient presents with  ? Cardiac follow-up  ? ? ?History of Present Illness: ?TATAYANA Gould is an 81 y.o. female last seen in February by Ms. Strader PA-C.  She was since seen in the atrial fibrillation clinic and ultimately by Dr. Quentin Ore with plan for atrial fibrillation ablation this summer.  She has failed antiarrhythmic therapy including Multaq and amiodarone.  CHA2DS2-VASc score is 4 and she is on Xarelto for stroke prophylaxis. ? ?She reports no major change in status, still with intermittent palpitations on current regimen which is outlined below.  No spontaneous bleeding problems on Xarelto.  Also no major change in health status otherwise.  She does not describe any angina. ? ?Past Medical History:  ?Diagnosis Date  ? Anemia   ? Arthritis   ? Breast cancer ([place])   ? Right mastectomy  ? Chronic back pain   ? Coronary atherosclerosis of native coronary artery   ? a. 12/2019 Cath: LM nl, LAD 89m LCX 50p, 80d, RCA 20p, 458m>med rx.  ? Demand [place] ([place]  ? a. 02/2021 HsTrop to 361 in setting of rapid afib; b. 02/2021 Echo: EF 65-70%, no rwma, mild LVH, RVSP 28.58m34m. Mildly to mod dil LA. Triv MR. Ao sclerosis w/o stenosis.  ? Environmental allergies   ? Essential hypertension   ? GERD (gastroesophageal reflux disease)   ? History of bronchitis   ? History of colon polyps   ? History of diverticulosis   ? History of migraine   ? Panic attacks   ? Persistent atrial fibrillation ([facility]/2022  ? a. CHA2DS2VASc = 5-->xarelto.  ? Prolapse of vaginal vault after hysterectomy   ? Seasonal allergies   ? Urinary frequency   ? ? ?Past Surgical History:  ?Procedure Laterality Date  ? ABDOMINAL HYSTERECTOMY    ? BACK SURGERY  1981/2011/2014  ? BLADDER SUSPENSION    ? BREAST  RECONSTRUCTION  2004  ? with abd tissue  ? BREAST SURGERY Right   ? CARDIOVERSION N/A 04/30/2021  ? Procedure: CARDIOVERSION;  Surgeon: BraArnoldo LenisD;  [facility];  Service: Endoscopy;  Laterality: N/A;  ? CARDIOVERSION N/A 05/16/2021  ? Procedure: CARDIOVERSION;  Surgeon: BraArnoldo LenisD;  [facility];  Service: Endoscopy;  Laterality: N/A;  ? CARDIOVERSION N/A 08/13/2021  ? Procedure: CARDIOVERSION;  Surgeon: SchDonato HeinzD;  [facility];  Service: Cardiovascular;  Laterality: N/A;  ? CHOLECYSTECTOMY    ? COLONOSCOPY  12/2009  ? RMR: few pancolonic diverticula. next TCS 12/2014  ? COLONOSCOPY  06/14/2007  ? RMRBSJ:GGEZMOQHUTctal polyp, status post cold biopsy removal/ Anal canal hemorrhoids/Left-sided diverticula Colonic mucosa appeared normal.Hyperplastic polyp.   ? COLONOSCOPY  01/18/2004  ? RMRMLY:YTKPTWSverticula/Internal hemorrhoids.  Otherwise normal rectum  ? COLONOSCOPY N/A 04/18/2013  ? Dr. RouGala Romneyormal rectum, scattered left sided diverticula the remainder of the colonic mucosa appeared normal  ? COLONOSCOPY N/A 08/18/2014  ? Procedure: COLONOSCOPY;  Surgeon: RobDaneil DolinD;  [facility];  Service: Endoscopy;  Laterality: N/A;  1000  ? ESOPHAGOGASTRODUODENOSCOPY  06/14/2007  ? RMRFKC:LEXNTZ-GYFVaques in esophageal mucosa of uncertain significance brushed and biopsied/Normal stomach, normal first duodenum and second duodenoscopy. KOH negative. esophageal bx c/w GERD  ? ESOPHAGOGASTRODUODENOSCOPY N/A 08/18/2014  ?  Procedure: ESOPHAGOGASTRODUODENOSCOPY (EGD);  Surgeon: Daneil Dolin, MD;  [facility];  Service: Endoscopy;  Laterality: N/A;  ? ESOPHAGOGASTRODUODENOSCOPY (EGD) WITH ESOPHAGEAL DILATION N/A 04/18/2013  ? Dr. Gala Romney- normal, patent appearing, tubular esophagus, normal gastric mucosa, paptent pylorus, normal first and second portion of the duodenum  ? EYE SURGERY    ? lasik  ? FOOT SURGERY Right 2014  ? d/t plantar  fascitis  ? GIVENS CAPSULE STUDY N/A 07/18/2013  ? L.Lewis PAC- markedly abnormal appearing gastric mucosa, sugnificant bile reflux, questionable subucosal small bowel mass versus extrinsic compression in the distal small bowel.- CTE= no small bowel mass or tumor seen.  ? LEFT HEART CATH AND CORONARY ANGIOGRAPHY N/A 01/16/2020  ? Procedure: LEFT HEART CATH AND CORONARY ANGIOGRAPHY;  Surgeon: Burnell Blanks, MD;  Location: [place] [facility];  Service: Cardiovascular;  Laterality: N/A;  ? MALONEY DILATION N/A 08/18/2014  ? Procedure: MALONEY DILATION;  Surgeon: Daneil Dolin, MD;  [facility];  Service: Endoscopy;  Laterality: N/A;  ? MASTECTOMY    ? right side 12-13 years ago  ? MOUTH SURGERY    ? cyst removed in June 2022.  ? TOTAL HIP ARTHROPLASTY Left 02/29/2016  ? Procedure: TOTAL HIP ARTHROPLASTY ANTERIOR APPROACH;  Surgeon: Frederik Pear, MD;  Location: [place];  Service: Orthopedics;  Laterality: Left;  ? TOTAL HIP ARTHROPLASTY Right 01/05/2017  ? Procedure: TOTAL HIP ARTHROPLASTY ANTERIOR APPROACH;  Surgeon: Frederik Pear, MD;  Location: [place];  Service: Orthopedics;  Laterality: Right;  REQUEST 90 MINS  ? ? ?Current Outpatient Medications  ?Medication Sig Dispense Refill  ? albuterol (VENTOLIN HFA) 108 (90 Base) MCG/ACT inhaler Inhale 1-2 puffs into the lungs every 6 (six) hours as needed for shortness of breath or wheezing.    ? amiodarone (PACERONE) 200 MG tablet Take 2 tablets (400 mg total) by mouth 2 (two) times daily for 5 days, THEN 2 tablets (400 mg total) daily for 5 days, THEN 1 tablet (200 mg total) daily. 90 tablet 3  ? diphenhydrAMINE (BENADRYL) 25 MG tablet Take 25 mg by mouth as needed for allergies.    ? docusate sodium (COLACE) 100 MG capsule Take 100 mg by mouth daily as needed for mild constipation.    ? HYDROcodone-acetaminophen (NORCO/VICODIN) 5-325 MG tablet Take 1 tablet by mouth every 8 (eight) hours as needed for moderate pain (back pain.).    ? metoprolol succinate  (TOPROL XL) 100 MG 24 hr tablet Take 0.5 daily with or immediately following a meal. 30 tablet 11  ? nitroGLYCERIN (NITROSTAT) 0.4 MG SL tablet Place 1 tablet (0.4 mg total) under the tongue every 5 (five) minutes as needed for chest pain. 25 tablet 3  ? omeprazole (PRILOSEC) 20 MG capsule Take 20 mg by mouth daily as needed (indigestion/heartburn).    ? rivaroxaban (XARELTO) 20 MG TABS tablet Take 1 tablet (20 mg total) by mouth daily with supper. 30 tablet 3  ? rosuvastatin (CRESTOR) 5 MG tablet TAKE ONE TABLET BY MOUTH DAILY 90 tablet 1  ? ?No current facility-administered medications for this visit.  ? ?Allergies:  Penicillins and Apixaban  ? ?ROS: No orthopnea or PND. ? ?Physical Exam: ?VS:  BP (!) 142/82 (BP Location: Left Arm, Patient Position: Sitting, Cuff Size: Large)   Pulse 79   Ht 5' 5.5" (1.664 m)   Wt 159 lb (72.1 kg)   SpO2 98%   BMI 26.06 kg/m? , BMI Body mass index is 26.06 kg/m?. ? ?Wt  Readings from Last 3 Encounters:  ?09/09/21 159 lb (72.1 kg)  ?08/20/21 156 lb 12.8 oz (71.1 kg)  ?08/13/21 158 lb (71.7 kg)  ?  ?General: Patient appears comfortable at rest. ?HEENT: Conjunctiva and lids normal. ?Neck: Supple, no elevated JVP or carotid bruits, no thyromegaly. ?Lungs: Clear to auscultation, nonlabored breathing at rest. ?Cardiac: Irregularly irregular, no S3, 1/6 systolic murmur, no pericardial rub. ?Extremities: No pitting edema. ? ?ECG:  An ECG dated 08/20/2021 was personally reviewed today and demonstrated:  Atrial fibrillation with RVR, nonspecific ST-T changes. ? ?Recent Labwork: ?01/11/2021: ALT 10; AST 24 ?03/27/2021: Magnesium 2.0 ?03/28/2021: TSH 3.023 ?07/30/2021: BUN 18; Creatinine, Ser 1.28; Hemoglobin 11.8; Platelets 274; Potassium 4.3; Sodium 139  ? ?Other Studies Reviewed Today: ? ?Cardiac catheterization 01/16/2020: ?Mid RCA lesion is 40% stenosed. ?Prox RCA lesion is 20% stenosed. ?Dist Cx lesion is 80% stenosed. ?Prox Cx lesion is 50% stenosed. ?Mid LAD lesion is 20% stenosed. ?   ?1. Moderate stenosis mid Circumflex. This lesion is not flow limiting by functional assessment (DFR 0.96). The small caliber distal Circumflex has a severe stenosis but this vessel appears to be too small for PCI (1.75

## 2021-09-09 ENCOUNTER — Encounter: Payer: Self-pay | Admitting: Cardiology

## 2021-09-09 ENCOUNTER — Ambulatory Visit: Payer: Medicare Other | Admitting: Cardiology

## 2021-09-09 VITALS — BP 142/82 | HR 79 | Ht 65.5 in | Wt 159.0 lb

## 2021-09-09 DIAGNOSIS — I4819 Other persistent atrial fibrillation: Secondary | ICD-10-CM

## 2021-09-09 DIAGNOSIS — I25119 Atherosclerotic heart disease of native coronary artery with unspecified angina pectoris: Secondary | ICD-10-CM

## 2021-09-09 NOTE — Patient Instructions (Addendum)
Medication Instructions:   Your physician recommends that you continue on your current medications as directed. Please refer to the Current Medication list given to you today.  Labwork:  none  Testing/Procedures:  none  Follow-Up:  Your physician recommends that you schedule a follow-up appointment in: 3 months.  Any Other Special Instructions Will Be Listed Below (If Applicable).  If you need a refill on your cardiac medications before your next appointment, please call your pharmacy. 

## 2021-10-07 ENCOUNTER — Other Ambulatory Visit: Payer: Medicare Other

## 2021-10-07 ENCOUNTER — Other Ambulatory Visit (HOSPITAL_COMMUNITY)
Admission: RE | Admit: 2021-10-07 | Discharge: 2021-10-07 | Disposition: A | Discharge status: Home / Self Care | Payer: Medicare Other | Source: Ambulatory Visit | Attending: Cardiology | Admitting: Cardiology

## 2021-10-07 DIAGNOSIS — I4819 Other persistent atrial fibrillation: Secondary | ICD-10-CM | POA: Insufficient documentation

## 2021-10-07 LAB — CBC WITH DIFFERENTIAL/PLATELET
Abs Immature Granulocytes: 0.04 10*3/uL (ref 0.00–0.07)
Basophils Absolute: 0.1 10*3/uL (ref 0.0–0.1)
Basophils Relative: 1 %
Eosinophils Absolute: 0.2 10*3/uL (ref 0.0–0.5)
Eosinophils Relative: 2 %
HCT: 36.2 % (ref 36.0–46.0)
Hemoglobin: 11.9 g/dL — ABNORMAL LOW (ref 12.0–15.0)
Immature Granulocytes: 1 %
Lymphocytes Relative: 33 %
Lymphs Abs: 2.9 10*3/uL (ref 0.7–4.0)
MCH: 31.2 pg (ref 26.0–34.0)
MCHC: 32.9 g/dL (ref 30.0–36.0)
MCV: 95 fL (ref 80.0–100.0)
Monocytes Absolute: 0.6 10*3/uL (ref 0.1–1.0)
Monocytes Relative: 7 %
Neutro Abs: 4.9 10*3/uL (ref 1.7–7.7)
Neutrophils Relative %: 56 %
Platelets: 295 10*3/uL (ref 150–400)
RBC: 3.81 MIL/uL — ABNORMAL LOW (ref 3.87–5.11)
RDW: 14.1 % (ref 11.5–15.5)
WBC: 8.8 10*3/uL (ref 4.0–10.5)
nRBC: 0 % (ref 0.0–0.2)

## 2021-10-07 LAB — BASIC METABOLIC PANEL
Anion gap: 5 (ref 5–15)
BUN: 25 mg/dL — ABNORMAL HIGH (ref 8–23)
CO2: 25 mmol/L (ref 22–32)
Calcium: 9.5 mg/dL (ref 8.9–10.3)
Chloride: 106 mmol/L (ref 98–111)
Creatinine, Ser: 1.17 mg/dL — ABNORMAL HIGH (ref 0.44–1.00)
GFR, Estimated: 47 mL/min — ABNORMAL LOW (ref >60)
Glucose, Bld: 121 mg/dL — ABNORMAL HIGH (ref 70–99)
Potassium: 4.3 mmol/L (ref 3.5–5.1)
Sodium: 136 mmol/L (ref 135–145)

## 2021-10-11 ENCOUNTER — Telehealth: Payer: Self-pay | Admitting: Cardiology

## 2021-10-11 NOTE — Telephone Encounter (Signed)
Printed CT and ablation instructions mailed out to patient at her request. Answered all questions.  Verbalized understanding and agreement.

## 2021-10-11 NOTE — Telephone Encounter (Signed)
Patient lost the instructions for her upcoming procedure. Requesting a callback to discuss.

## 2021-10-18 ENCOUNTER — Telehealth (HOSPITAL_COMMUNITY): Payer: Self-pay | Admitting: Emergency Medicine

## 2021-10-21 ENCOUNTER — Ambulatory Visit (HOSPITAL_COMMUNITY)
Admission: RE | Admit: 2021-10-21 | Discharge: 2021-10-21 | Disposition: A | Discharge status: Home / Self Care | Payer: Medicare Other | Source: Ambulatory Visit | Attending: Cardiology | Admitting: Cardiology

## 2021-10-21 DIAGNOSIS — I4891 Unspecified atrial fibrillation: Secondary | ICD-10-CM | POA: Insufficient documentation

## 2021-10-21 MED ORDER — METOPROLOL TARTRATE 5 MG/5ML IV SOLN: 5.0000 mg | Freq: Once | INTRAVENOUS | Status: AC

## 2021-10-21 MED ORDER — METOPROLOL TARTRATE 5 MG/5ML IV SOLN
INTRAVENOUS | Status: AC
  Administered 2021-10-21: 5 mg via INTRAVENOUS
  Filled 2021-10-21: qty 5

## 2021-10-21 MED ORDER — IOHEXOL 350 MG/ML SOLN
80.0000 mL | Freq: Once | INTRAVENOUS | Status: AC | PRN
  Administered 2021-10-21: 80 mL via INTRAVENOUS

## 2021-10-25 NOTE — Pre-Procedure Instructions (Signed)
Instructed patient on the following items: Arrival time 0830 Nothing to eat or drink after midnight No meds AM of procedure Responsible person to drive you home and stay with you for 24 hrs  Have you missed any doses of anti-coagulant Xarelto-hasn't missed any doses   

## 2021-10-28 ENCOUNTER — Other Ambulatory Visit (HOSPITAL_COMMUNITY): Payer: Self-pay

## 2021-10-28 ENCOUNTER — Encounter (HOSPITAL_COMMUNITY)
Admission: RE | Disposition: A | Discharge status: Home / Self Care | Payer: Medicare Other | Source: Home / Self Care | Attending: Cardiology

## 2021-10-28 ENCOUNTER — Other Ambulatory Visit: Payer: Self-pay

## 2021-10-28 ENCOUNTER — Ambulatory Visit (HOSPITAL_COMMUNITY): Payer: Medicare Other | Admitting: Certified Registered"

## 2021-10-28 ENCOUNTER — Ambulatory Visit (HOSPITAL_BASED_OUTPATIENT_CLINIC_OR_DEPARTMENT_OTHER): Payer: Medicare Other | Admitting: Certified Registered"

## 2021-10-28 ENCOUNTER — Ambulatory Visit (HOSPITAL_COMMUNITY)
Admission: RE | Admit: 2021-10-28 | Discharge: 2021-10-28 | Disposition: A | Discharge status: Home / Self Care | Payer: Medicare Other | Attending: Cardiology | Admitting: Cardiology

## 2021-10-28 DIAGNOSIS — I4891 Unspecified atrial fibrillation: Secondary | ICD-10-CM

## 2021-10-28 DIAGNOSIS — I25118 Atherosclerotic heart disease of native coronary artery with other forms of angina pectoris: Secondary | ICD-10-CM | POA: Diagnosis not present

## 2021-10-28 DIAGNOSIS — Z87891 Personal history of nicotine dependence: Secondary | ICD-10-CM

## 2021-10-28 DIAGNOSIS — I2510 Atherosclerotic heart disease of native coronary artery without angina pectoris: Secondary | ICD-10-CM

## 2021-10-28 DIAGNOSIS — I10 Essential (primary) hypertension: Secondary | ICD-10-CM | POA: Diagnosis not present

## 2021-10-28 DIAGNOSIS — I4819 Other persistent atrial fibrillation: Secondary | ICD-10-CM | POA: Diagnosis not present

## 2021-10-28 DIAGNOSIS — Z7901 Long term (current) use of anticoagulants: Secondary | ICD-10-CM | POA: Diagnosis not present

## 2021-10-28 HISTORY — PX: ATRIAL FIBRILLATION ABLATION: EP1191

## 2021-10-28 LAB — POCT ACTIVATED CLOTTING TIME
Activated Clotting Time: 287 seconds
Activated Clotting Time: 299 seconds
Activated Clotting Time: 420 seconds

## 2021-10-28 SURGERY — ATRIAL FIBRILLATION ABLATION
Anesthesia: General

## 2021-10-28 MED ORDER — ONDANSETRON HCL 4 MG/2ML IJ SOLN: 4.0000 mg | Freq: Four times a day (QID) | INTRAMUSCULAR | Status: DC | PRN

## 2021-10-28 MED ORDER — PANTOPRAZOLE SODIUM 40 MG PO TBEC
40.0000 mg | DELAYED_RELEASE_TABLET | Freq: Every day | ORAL | 0 refills | Status: DC
  Filled 2021-10-28: qty 45, 45d supply, fill #0

## 2021-10-28 MED ORDER — ONDANSETRON HCL 4 MG/2ML IJ SOLN
INTRAMUSCULAR | Status: DC | PRN
  Administered 2021-10-28: 4 mg via INTRAVENOUS

## 2021-10-28 MED ORDER — HEPARIN (PORCINE) IN NACL 1000-0.9 UT/500ML-% IV SOLN
INTRAVENOUS | Status: AC
Start: 2021-10-28 — End: ?
  Filled 2021-10-28: qty 500

## 2021-10-28 MED ORDER — COLCHICINE 0.6 MG PO TABS
0.6000 mg | ORAL_TABLET | Freq: Two times a day (BID) | ORAL | 0 refills | Status: DC
  Filled 2021-10-28: qty 10, 5d supply, fill #0

## 2021-10-28 MED ORDER — SODIUM CHLORIDE 0.9% FLUSH: 3.0000 mL | Freq: Two times a day (BID) | INTRAVENOUS | Status: DC

## 2021-10-28 MED ORDER — PHENYLEPHRINE HCL-NACL 20-0.9 MG/250ML-% IV SOLN
INTRAVENOUS | Status: DC | PRN
  Administered 2021-10-28: 40 ug/min via INTRAVENOUS

## 2021-10-28 MED ORDER — HEPARIN SODIUM (PORCINE) 1000 UNIT/ML IJ SOLN
INTRAMUSCULAR | Status: DC | PRN
  Administered 2021-10-28: 1000 [IU] via INTRAVENOUS

## 2021-10-28 MED ORDER — PHENYLEPHRINE 80 MCG/ML (10ML) SYRINGE FOR IV PUSH (FOR BLOOD PRESSURE SUPPORT)
PREFILLED_SYRINGE | INTRAVENOUS | Status: DC | PRN
  Administered 2021-10-28: 80 ug via INTRAVENOUS

## 2021-10-28 MED ORDER — ACETAMINOPHEN 325 MG PO TABS
ORAL_TABLET | ORAL | Status: AC
  Filled 2021-10-28: qty 2

## 2021-10-28 MED ORDER — PANTOPRAZOLE SODIUM 40 MG PO TBEC
40.0000 mg | DELAYED_RELEASE_TABLET | Freq: Every day | ORAL | Status: DC
  Administered 2021-10-28: 40 mg via ORAL
  Filled 2021-10-28: qty 1

## 2021-10-28 MED ORDER — HEPARIN SODIUM (PORCINE) 1000 UNIT/ML IJ SOLN
INTRAMUSCULAR | Status: DC | PRN
  Administered 2021-10-28: 11000 [IU] via INTRAVENOUS
  Administered 2021-10-28: 4000 [IU] via INTRAVENOUS
  Administered 2021-10-28: 5000 [IU] via INTRAVENOUS

## 2021-10-28 MED ORDER — HEPARIN (PORCINE) IN NACL 1000-0.9 UT/500ML-% IV SOLN
INTRAVENOUS | Status: AC
  Filled 2021-10-28: qty 500

## 2021-10-28 MED ORDER — SODIUM CHLORIDE 0.9 % IV SOLN: 250.0000 mL | INTRAVENOUS | Status: DC | PRN

## 2021-10-28 MED ORDER — HEPARIN (PORCINE) IN NACL 1000-0.9 UT/500ML-% IV SOLN
INTRAVENOUS | Status: DC | PRN
  Administered 2021-10-28 (×4): 500 mL

## 2021-10-28 MED ORDER — RIVAROXABAN 20 MG PO TABS
20.0000 mg | ORAL_TABLET | Freq: Every day | ORAL | Status: DC
Start: 2021-10-28 — End: 2021-10-28
  Administered 2021-10-28: 20 mg via ORAL
  Filled 2021-10-28: qty 1

## 2021-10-28 MED ORDER — SUGAMMADEX SODIUM 200 MG/2ML IV SOLN
INTRAVENOUS | Status: DC | PRN
  Administered 2021-10-28: 200 mg via INTRAVENOUS

## 2021-10-28 MED ORDER — SODIUM CHLORIDE 0.9% FLUSH: 3.0000 mL | INTRAVENOUS | Status: DC | PRN

## 2021-10-28 MED ORDER — FENTANYL CITRATE (PF) 250 MCG/5ML IJ SOLN
INTRAMUSCULAR | Status: DC | PRN
  Administered 2021-10-28 (×3): 25 ug via INTRAVENOUS

## 2021-10-28 MED ORDER — ROCURONIUM BROMIDE 10 MG/ML (PF) SYRINGE
PREFILLED_SYRINGE | INTRAVENOUS | Status: DC | PRN
  Administered 2021-10-28: 60 mg via INTRAVENOUS

## 2021-10-28 MED ORDER — LIDOCAINE 2% (20 MG/ML) 5 ML SYRINGE
INTRAMUSCULAR | Status: DC | PRN
  Administered 2021-10-28: 40 mg via INTRAVENOUS

## 2021-10-28 MED ORDER — HEPARIN SODIUM (PORCINE) 1000 UNIT/ML IJ SOLN
INTRAMUSCULAR | Status: AC
  Filled 2021-10-28: qty 10

## 2021-10-28 MED ORDER — COLCHICINE 0.6 MG PO TABS
0.6000 mg | ORAL_TABLET | Freq: Two times a day (BID) | ORAL | Status: DC
  Administered 2021-10-28: 0.6 mg via ORAL
  Filled 2021-10-28: qty 1

## 2021-10-28 MED ORDER — ACETAMINOPHEN 325 MG PO TABS
650.0000 mg | ORAL_TABLET | ORAL | Status: DC | PRN
  Administered 2021-10-28: 650 mg via ORAL

## 2021-10-28 MED ORDER — PROPOFOL 10 MG/ML IV BOLUS
INTRAVENOUS | Status: DC | PRN
  Administered 2021-10-28: 120 mg via INTRAVENOUS

## 2021-10-28 MED ORDER — SODIUM CHLORIDE 0.9 % IV SOLN: INTRAVENOUS | Status: DC

## 2021-10-28 MED ORDER — PHENYLEPHRINE 80 MCG/ML (10ML) SYRINGE FOR IV PUSH (FOR BLOOD PRESSURE SUPPORT)
PREFILLED_SYRINGE | INTRAVENOUS | Status: DC | PRN
Start: 2021-10-28 — End: 2021-10-28

## 2021-10-28 MED ORDER — PROTAMINE SULFATE 10 MG/ML IV SOLN
INTRAVENOUS | Status: DC | PRN
  Administered 2021-10-28: 25 mg via INTRAVENOUS
  Administered 2021-10-28: 10 mg via INTRAVENOUS

## 2021-10-28 SURGICAL SUPPLY — 16 items
CATH ABLAT QDOT MICRO BI TC DF (CATHETERS) ×1 IMPLANT
CATH OCTARAY 2.0 F 3-3-3-3-3 (CATHETERS) ×1 IMPLANT
CATH S-M CIRCA TEMP PROBE (CATHETERS) ×1 IMPLANT
CATH SOUNDSTAR ECO 8FR (CATHETERS) ×1 IMPLANT
CATH WEB BI DIR CSDF CRV REPRO (CATHETERS) ×1 IMPLANT
CLOSURE PERCLOSE PROSTYLE (VASCULAR PRODUCTS) ×3 IMPLANT
COVER SWIFTLINK CONNECTOR (BAG) ×2 IMPLANT
PACK EP LATEX FREE (CUSTOM PROCEDURE TRAY) ×2
PACK EP LF (CUSTOM PROCEDURE TRAY) ×1 IMPLANT
PAD DEFIB RADIO PHYSIO CONN (PAD) ×2 IMPLANT
PATCH CARTO3 (PAD) ×1 IMPLANT
SHEATH BAYLIS TRANSSEPTAL 98CM (NEEDLE) ×1 IMPLANT
SHEATH CARTO VIZIGO SM CVD (SHEATH) ×1 IMPLANT
SHEATH PINNACLE 8F 10CM (SHEATH) ×2 IMPLANT
SHEATH PINNACLE 9F 10CM (SHEATH) ×1 IMPLANT
TUBING SMART ABLATE COOLFLOW (TUBING) ×1 IMPLANT

## 2021-10-28 NOTE — Progress Notes (Signed)
I spoke with [name]. PA / EP. Regarding pt c/o dizziness when getting up, current HR 44, pressure did drop by time she was reconnected to monitor bp was 94/46 . Pt to remain on metoprolol at current dose. Pt was reminded about hydration/ get up slowly and to call Dr Claudie Revering office if continues or worsens. Pt and son verbalized understanding.

## 2021-10-28 NOTE — Anesthesia Postprocedure Evaluation (Signed)
Anesthesia Post Note  Patient: [name]  Procedure(s) Performed: ATRIAL FIBRILLATION ABLATION     Patient location during evaluation: PACU Anesthesia Type: General Level of consciousness: awake and alert Pain management: pain level controlled Vital Signs Assessment: post-procedure vital signs reviewed and stable Respiratory status: spontaneous breathing, nonlabored ventilation, respiratory function stable and patient connected to nasal cannula oxygen Cardiovascular status: blood pressure returned to baseline and stable Postop Assessment: no apparent nausea or vomiting Anesthetic complications: no   There were no known notable events for this encounter.  Last Vitals:  Vitals:   10/28/21 1345 10/28/21 1400  BP: (!) 136/54 (!) 135/55  Pulse: (!) 45   Resp: 18 17  Temp:    SpO2: 98%     Last Pain:  Vitals:   10/28/21 1324  TempSrc: Temporal  PainSc:                  Effie Berkshire

## 2021-10-28 NOTE — H&P (Signed)
Electrophysiology Office Note:     Date:  10/28/2021    ID:  Melinda, Gould June 30, 1940, MRN [id number]   PCP:  Pablo Lawrence, NP           [facility] Cardiologist:  Rozann Lesches, MD  [facility] Electrophysiologist:  Vickie Epley, MD    Referring MD: Sherran Needs, NP    Chief Complaint: AF   History of Present Illness:     [name] is a 81 y.o. female who presents for an evaluation of AF at the request of Doristine Devoid. Their medical history includes CAD, HTN, persistent AF. She has previously taken multaq and failed cardioversion. She is fatigued while out of rhythm. She was referred by Dr Domenic Polite to the AF clinic to discuss dofetilide but the patient was more interested in pursuing catheter ablation.    She is highly symptomatic with her atrial fibrillation.  She reports a markedly reduced exercise tolerance and shortness of breath.  She has had a cardioversion in the past.  She does tell me that she felt better in the days after the cardioversion before returning to atrial fibrillation.   Presents for PVI.     Objective      Past Medical History:  Diagnosis Date   Anemia     Arthritis     Breast cancer ([place])      Right mastectomy   Chronic back pain     Coronary atherosclerosis of native coronary artery      a. 12/2019 Cath: LM nl, LAD 63m LCX 50p, 80d, RCA 20p, 482m>med rx.   Demand ischemia ([facility]     a. 02/2021 HsTrop to 361 in setting of rapid afib; b. 02/2021 Echo: EF 65-70%, no rwma, mild LVH, RVSP 28.5m40m. Mildly to mod dil LA. Triv MR. Ao sclerosis w/o stenosis.   Environmental allergies     Essential hypertension     GERD (gastroesophageal reflux disease)     History of bronchitis     History of colon polyps     History of diverticulosis     History of migraine     Panic attacks     Persistent [place] ([place]    a. CHA2DS2VASc = 5-->xarelto.   Prolapse of vaginal vault after hysterectomy     Seasonal  allergies     Urinary frequency             Past Surgical History:  Procedure Laterality Date   ABDOMINAL HYSTERECTOMY       BACK SURGERY   1981/2011/2014   BLADDER SUSPENSION       BREAST RECONSTRUCTION   2004    with abd tissue   BREAST SURGERY Right     CARDIOVERSION N/A 04/30/2021    Procedure: CARDIOVERSION;  Surgeon: BraArnoldo LenisD;  Location: [facility];  Service: Endoscopy;  Laterality: N/A;   CARDIOVERSION N/A 05/16/2021    Procedure: CARDIOVERSION;  Surgeon: BraArnoldo LenisD;  Location: [facility];  Service: Endoscopy;  Laterality: N/A;   CHOLECYSTECTOMY       COLONOSCOPY   12/2009    RMR: few pancolonic diverticula. next TCS 12/2014   COLONOSCOPY   06/14/2007    RMRTKZ:SWFUXNATFTctal polyp, status post cold biopsy removal/ Anal canal hemorrhoids/Left-sided diverticula Colonic mucosa appeared normal.Hyperplastic polyp.    COLONOSCOPY   01/18/2004    RMRDDU:KGURKYHverticula/Internal hemorrhoids.  Otherwise normal rectum   COLONOSCOPY N/A 04/18/2013    Dr. RouGala Romneyormal rectum,  scattered left sided diverticula the remainder of the colonic mucosa appeared normal   COLONOSCOPY N/A 08/18/2014    Procedure: COLONOSCOPY;  Surgeon: Daneil Dolin, MD;  [facility];  Service: Endoscopy;  Laterality: N/A;  1000   ESOPHAGOGASTRODUODENOSCOPY   06/14/2007    CHE:NIDPOE-UMPN plaques in esophageal mucosa of uncertain significance brushed and biopsied/Normal stomach, normal first duodenum and second duodenoscopy. KOH negative. esophageal bx c/w GERD   ESOPHAGOGASTRODUODENOSCOPY N/A 08/18/2014    Procedure: ESOPHAGOGASTRODUODENOSCOPY (EGD);  Surgeon: Daneil Dolin, MD;  [facility];  Service: Endoscopy;  Laterality: N/A;   ESOPHAGOGASTRODUODENOSCOPY (EGD) WITH ESOPHAGEAL DILATION N/A 04/18/2013    Dr. Gala Romney- normal, patent appearing, tubular esophagus, normal gastric mucosa, paptent pylorus, normal first and second portion of the duodenum   EYE SURGERY         lasik   FOOT SURGERY Right 2014    d/t plantar fascitis   GIVENS CAPSULE STUDY N/A 07/18/2013    L.Lewis PAC- markedly abnormal appearing gastric mucosa, sugnificant bile reflux, questionable subucosal small bowel mass versus extrinsic compression in the distal small bowel.- CTE= no small bowel mass or tumor seen.   LEFT HEART CATH AND CORONARY ANGIOGRAPHY N/A 01/16/2020    Procedure: LEFT HEART CATH AND CORONARY ANGIOGRAPHY;  Surgeon: Burnell Blanks, MD;  Location: [place] [facility];  Service: Cardiovascular;  Laterality: N/A;   MALONEY DILATION N/A 08/18/2014    Procedure: Venia Minks DILATION;  Surgeon: Daneil Dolin, MD;  [facility];  Service: Endoscopy;  Laterality: N/A;   MASTECTOMY        right side 12-13 years ago   MOUTH SURGERY        cyst removed in June 2022.   TOTAL HIP ARTHROPLASTY Left 02/29/2016    Procedure: TOTAL HIP ARTHROPLASTY ANTERIOR APPROACH;  Surgeon: Frederik Pear, MD;  Location: [place];  Service: Orthopedics;  Laterality: Left;   TOTAL HIP ARTHROPLASTY Right 01/05/2017    Procedure: TOTAL HIP ARTHROPLASTY ANTERIOR APPROACH;  Surgeon: Frederik Pear, MD;  Location: [place];  Service: Orthopedics;  Laterality: Right;  REQUEST 90 MINS      Current Medications: Active Medications      Current Meds  Medication Sig   albuterol (VENTOLIN HFA) 108 (90 Base) MCG/ACT inhaler Inhale 1-2 puffs into the lungs every 6 (six) hours as needed for shortness of breath or wheezing.   diphenhydrAMINE (BENADRYL) 25 MG tablet Take 25 mg by mouth as needed for allergies.   HYDROcodone-acetaminophen (NORCO/VICODIN) 5-325 MG tablet Take 1 tablet by mouth every 8 (eight) hours as needed for moderate pain (back pain.).   Menthol-Methyl Salicylate (SALONPAS PAIN RELIEF PATCH) PTCH Place 1 patch onto the skin daily as needed (pain.). (Foot pain)   metoprolol succinate (TOPROL-XL) 100 MG 24 hr tablet Take 0.5 tablets (50 mg total) by mouth daily. Take with or immediately  following a meal.   nitroGLYCERIN (NITROSTAT) 0.4 MG SL tablet Place 1 tablet (0.4 mg total) under the tongue every 5 (five) minutes as needed for chest pain.   omeprazole (PRILOSEC) 20 MG capsule Take 20 mg by mouth daily as needed (indigestion/heartburn).   rivaroxaban (XARELTO) 20 MG TABS tablet Take 1 tablet (20 mg total) by mouth daily with supper.   rosuvastatin (CRESTOR) 5 MG tablet TAKE ONE TABLET BY MOUTH DAILY (Patient taking differently: Take 5 mg by mouth every evening.)        Allergies:   Penicillins and Apixaban    Social History  Socioeconomic History   Marital status: Widowed      Spouse name: [name]   Number of children: 4   Years of education: 12th   Highest education level: Not on file  Occupational History      Employer: RETIRED  Tobacco Use   Smoking status: Former      Packs/day: 1.50      Years: 30.00      Pack years: 45.00      Types: Cigarettes      Quit date: 04/28/2000      Years since quitting: 21.2   Smokeless tobacco: Never  Vaping Use   Vaping Use: Never used  Substance and Sexual Activity   Alcohol use: No   Drug use: No   Sexual activity: Not on file      Comment: hyst  Other Topics Concern   Not on file  Social History Narrative    Patient lives at home with spouse.    Caffeine Use: 2 cups of coffee daily    Social Determinants of Health    Financial Resource Strain: Not on file  Food Insecurity: Not on file  Transportation Needs: Not on file  Physical Activity: Not on file  Stress: Not on file  Social Connections: Not on file      Family History: The patient's family history includes Cancer in her brother and sister; Colon cancer in her brother; Coronary artery disease in her brother, father, and mother; Diabetes in her son; Emphysema in her brother; Healthy in her son, son, son, and son; Lung cancer in her sister.   ROS:   Please see the history of present illness.    All other systems reviewed and are negative.    EKGs/Labs/Other Studies Reviewed:     The following studies were reviewed today:   03/28/2021 Echo EF 65% Mild LVH RV normal Mild-mod dilated LA Trivial MR     EKG:  The ekg ordered today demonstrates atrial fibrillation with a rapid ventricular rate at 128 bpm.     Recent Labs: 01/11/2021: ALT 10 03/27/2021: Magnesium 2.0 03/28/2021: TSH 3.023 04/24/2021: Hemoglobin 11.8; Platelets 324 05/15/2021: BUN 31; Creatinine, Ser 1.09; Potassium 4.0; Sodium 136  Recent Lipid Panel Labs (Brief)  No results found for: CHOL, TRIG, HDL, CHOLHDL, VLDL, LDLCALC, LDLDIRECT     Physical Exam:     VS:  BP (!) 176/89   Pulse (!) 84 Ht 5' 5.5" (1.664 m)   Wt 159 lb 9.6 oz (72.4 kg)   SpO2 97%   BMI 26.15 kg/m         Wt Readings from Last 3 Encounters:  07/24/21 159 lb 9.6 oz (72.4 kg)  06/12/21 162 lb (73.5 kg)  05/28/21 159 lb 3.2 oz (72.2 kg)      GEN:  Well nourished, well developed in no acute distress HEENT: Normal NECK: No JVD; No carotid bruits LYMPHATICS: No lymphadenopathy CARDIAC: Regular rhythm, tachycardic, no murmurs, rubs, gallops RESPIRATORY:  Clear to auscultation without rales, wheezing or rhonchi  ABDOMEN: Soft, non-tender, non-distended MUSCULOSKELETAL:  No edema; No deformity  SKIN: Warm and dry NEUROLOGIC:  Alert and oriented x 3 PSYCHIATRIC:  Normal affect          Assessment ASSESSMENT:     1. Persistent atrial fibrillation ([place])   2. Coronary artery disease involving native coronary artery of native heart with other form of angina pectoris ([place])   3. Primary hypertension     PLAN:     In order  of problems listed above:   #Persistent atrial fibrillation Symptomatic.  Poorly rate controlled.  On Xarelto for stroke prophylaxis.  Has failed 2 prior cardioversions.  I discussed the role of catheter ablation with the patient in detail during today's visit.  I discussed the procedural details, the risks, recovery and likelihood of success.  She would  like to proceed with scheduling.  I discussed the possibility of needing a second ablation procedure.   Risk, benefits, and alternatives to EP study and radiofrequency ablation for afib were also discussed in detail today. These risks include but are not limited to stroke, bleeding, vascular damage, tamponade, perforation, damage to the esophagus, lungs, and other structures, pulmonary vein stenosis, worsening renal function, and death. The patient understands these risk and wishes to proceed.  We will therefore proceed with catheter ablation at the next available time.  Carto, ICE, anesthesia are requested for the procedure.  Will also obtain CT PV protocol prior to the procedure to exclude LAA thrombus and further evaluate atrial anatomy.   Presents today for PVI.  #Hypertension above goal today.  Continue current medical therapy.  I would like her to check her blood pressures at home 1-2 times per week and bring those recordings to the next appointment.         Signed, Hilton Cork. Quentin Ore, MD, [facility], [facility] 10/28/2021 Electrophysiology [facility]

## 2021-10-28 NOTE — Discharge Instructions (Addendum)
Post procedure care instructions No driving for 4 days. No lifting over 5 lbs for 1 week. No vigorous or sexual activity for 1 week. You may return to work/your usual activities on 11/05/21. Keep procedure site clean & dry. If you notice increased pain, swelling, bleeding or pus, call/return!  You may shower after 24 hours, but no soaking in baths/hot tubs/pools for 1 week.    You have an appointment set up with the [facility].  Multiple studies have shown that being followed by a dedicated atrial fibrillation clinic in addition to the standard care you receive from your other physicians improves health. We believe that enrollment in the atrial fibrillation clinic will allow [place] to better care for you.   The phone number to the [facility] is [phone]. The clinic is staffed Monday through Friday from 8:30am to 5pm.  Parking Directions: The clinic is located in the Heart and Vascular Building connected to [facility]. 1)From [address] turn on to [address] and go to the 3rd entrance  (Heart and Vascular entrance) on the right. 2)Look to the right for Heart &Vascular Parking Garage. 3)A code for the entrance is required, for August is 1403.   4)Take the elevators to the 1st floor. Registration is in the room with the glass walls at the end of the hallway.  If you have any trouble parking or locating the clinic, please don't hesitate to call [phone].   Cardiac Ablation, Care After  This sheet gives you information about how to care for yourself after your procedure. Your health care provider may also give you more specific instructions. If you have problems or questions, contact your health care provider. What can I expect after the procedure? After the procedure, it is common to have: Bruising around your puncture site. Tenderness around your puncture site. Skipped heartbeats. Tiredness (fatigue).  Follow these instructions at  home: Puncture site care   If a large square bandage is present, this may be removed 24 hours after surgery.  Check your puncture site every day for signs of infection. Check for: Redness, swelling, or pain. Fluid or blood. If your puncture site starts to bleed, lie down on your back, apply firm pressure to the area, and contact your health care provider. Warmth. Pus or a bad smell. A pea or small marble sized lump at the site is normal and can take up to three months to resolve.  Driving Do not drive for at least 4 days after your procedure or however long your health care provider recommends. (Do not resume driving if you have previously been instructed not to drive for other health reasons.) Do not drive or use heavy machinery while taking prescription pain medicine. Activity Avoid activities that take a lot of effort for at least 7 days after your procedure. Do not lift anything that is heavier than 5 lb (4.5 kg) for one week.  No sexual activity for 1 week.  Return to your normal activities as told by your health care provider. Ask your health care provider what activities are safe for you. General instructions Take over-the-counter and prescription medicines only as told by your health care provider. Do not use any products that contain nicotine or tobacco, such as cigarettes and e-cigarettes. If you need help quitting, ask your health care provider. You may shower after 24 hours, but Do not take baths, swim, or use a hot tub for 1 week.  Do not drink alcohol for 24 hours  after your procedure. Keep all follow-up visits as told by your health care provider. This is important. Contact a health care provider if: You have redness, mild swelling, or pain around your puncture site. You have fluid or blood coming from your puncture site that stops after applying firm pressure to the area. Your puncture site feels warm to the touch. You have pus or a bad smell coming from your puncture  site. You have a fever. You have chest pain or discomfort that spreads to your neck, jaw, or arm. You are sweating a lot. You feel nauseous. You have a fast or irregular heartbeat. You have shortness of breath. You are dizzy or light-headed and feel the need to lie down. You have pain or numbness in the arm or leg closest to your puncture site. Get help right away if: Your puncture site suddenly swells. Your puncture site is bleeding and the bleeding does not stop after applying firm pressure to the area. These symptoms may represent a serious problem that is an emergency. Do not wait to see if the symptoms will go away. Get medical help right away. Call your local emergency services (911 in the [place] Do not drive yourself to the hospital. Summary After the procedure, it is normal to have bruising and tenderness at the puncture site in your groin, neck, or forearm. Check your puncture site every day for signs of infection. Get help right away if your puncture site is bleeding and the bleeding does not stop after applying firm pressure to the area. This is a medical emergency. This information is not intended to replace advice given to you by your health care provider. Make sure you discuss any questions you have with your health care provider.

## 2021-10-28 NOTE — Progress Notes (Signed)
Dr Quentin Ore in and ok to d/c home at 1800

## 2021-10-28 NOTE — Transfer of Care (Signed)
Immediate Anesthesia Transfer of Care Note  Patient: [name]  Procedure(s) Performed: ATRIAL FIBRILLATION ABLATION  Patient Location: Cath Lab  Anesthesia Type:General  Level of Consciousness: awake, alert  and oriented  Airway & Oxygen Therapy: Patient connected to nasal cannula oxygen  Post-op Assessment: Post -op Vital signs reviewed and stable  Post vital signs: stable  Last Vitals:  Vitals Value Taken Time  BP 142/85 10/28/21 1251  Temp    Pulse 44 10/28/21 1253  Resp 12 10/28/21 1253  SpO2 100 % 10/28/21 1253  Vitals shown include unvalidated device data.  Last Pain:  Vitals:   10/28/21 0756  TempSrc: Oral         Complications: There were no known notable events for this encounter.

## 2021-10-28 NOTE — Anesthesia Preprocedure Evaluation (Addendum)
Anesthesia Evaluation  Patient identified by MRN, date of birth, ID band Patient awake    Reviewed: Allergy & Precautions, NPO status , Patient's Chart, lab work & pertinent test results, reviewed documented beta blocker date and time   Airway Mallampati: II  TM Distance: >3 FB Neck ROM: Full    Dental  (+) Dental Advisory Given, Upper Dentures   Pulmonary asthma , former smoker,    breath sounds clear to auscultation       Cardiovascular hypertension, Pt. on home beta blockers + CAD  + dysrhythmias Atrial Fibrillation  Rhythm:Regular Rate:Normal  Echo: 1. Left ventricular ejection fraction, by estimation, is 65 to 70%. The  left ventricle has normal function. The left ventricle has no regional  wall motion abnormalities. There is mild left ventricular hypertrophy.  Left ventricular diastolic parameters  are indeterminate.  2. Right ventricular systolic function is normal. The right ventricular  size is normal. There is normal pulmonary artery systolic pressure. The  estimated right ventricular systolic pressure is 24.2 mmHg.  3. Left atrial size was mild to moderately dilated.  4. There is a trivial pericardial effusion posterior to the left  ventricle.  5. The mitral valve is grossly normal. Trivial mitral valve  regurgitation.  6. The aortic valve is tricuspid. Aortic valve regurgitation is not  visualized. Aortic valve sclerosis is present, with no evidence of aortic  valve stenosis. Aortic valve mean gradient measures 3.5 mmHg.  7. The inferior vena cava is normal in size with greater than 50%  respiratory variability, suggesting right atrial pressure of 3 mmHg.    Neuro/Psych Anxiety negative neurological ROS     GI/Hepatic Neg liver ROS, GERD  Medicated,  Endo/Other  negative endocrine ROS  Renal/GU negative Renal ROS     Musculoskeletal  (+) Arthritis ,   Abdominal   Peds  Hematology negative  hematology ROS (+)   Anesthesia Other Findings   Reproductive/Obstetrics                            Anesthesia Physical Anesthesia Plan  ASA: 3  Anesthesia Plan: General   Post-op Pain Management:    Induction: Intravenous  PONV Risk Score and Plan: 3 and Ondansetron and Treatment may vary due to age or medical condition  Airway Management Planned: Oral ETT  Additional Equipment: None  Intra-op Plan:   Post-operative Plan: Extubation in OR  Informed Consent:   Plan Discussed with: CRNA  Anesthesia Plan Comments:         Anesthesia Quick Evaluation

## 2021-10-28 NOTE — Anesthesia Procedure Notes (Signed)
Procedure Name: Intubation Date/Time: 10/28/2021 10:30 AM  Performed by: Lavell Luster, CRNAPre-anesthesia Checklist: Patient identified, Emergency Drugs available, Suction available, Patient being monitored and Timeout performed Patient Re-evaluated:Patient Re-evaluated prior to induction Oxygen Delivery Method: Circle system utilized Preoxygenation: Pre-oxygenation with 100% oxygen Induction Type: IV induction Ventilation: Mask ventilation without difficulty Laryngoscope Size: Mac and 3 Grade View: Grade I Tube type: Oral Tube size: 7.0 mm Number of attempts: 1 Airway Equipment and Method: Stylet Placement Confirmation: ETT inserted through vocal cords under direct vision, positive ETCO2 and breath sounds checked- equal and bilateral Secured at: 21 cm Tube secured with: Tape Dental Injury: Teeth and Oropharynx as per pre-operative assessment

## 2021-10-30 ENCOUNTER — Observation Stay (HOSPITAL_COMMUNITY)
Admission: EM | Admit: 2021-10-30 | Discharge: 2021-11-01 | Disposition: A | Discharge status: Home / Self Care | Payer: Medicare Other | Attending: Internal Medicine | Admitting: Internal Medicine

## 2021-10-30 ENCOUNTER — Telehealth: Payer: Self-pay | Admitting: Cardiology

## 2021-10-30 ENCOUNTER — Other Ambulatory Visit: Payer: Self-pay

## 2021-10-30 ENCOUNTER — Encounter (HOSPITAL_COMMUNITY): Payer: Self-pay | Admitting: Cardiology

## 2021-10-30 ENCOUNTER — Emergency Department (HOSPITAL_COMMUNITY): Payer: Medicare Other

## 2021-10-30 ENCOUNTER — Telehealth: Payer: Self-pay

## 2021-10-30 DIAGNOSIS — I482 Chronic atrial fibrillation, unspecified: Secondary | ICD-10-CM | POA: Diagnosis not present

## 2021-10-30 DIAGNOSIS — E039 Hypothyroidism, unspecified: Secondary | ICD-10-CM | POA: Diagnosis not present

## 2021-10-30 DIAGNOSIS — Z96643 Presence of artificial hip joint, bilateral: Secondary | ICD-10-CM | POA: Insufficient documentation

## 2021-10-30 DIAGNOSIS — I161 Hypertensive emergency: Secondary | ICD-10-CM

## 2021-10-30 DIAGNOSIS — I214 Non-ST elevation (NSTEMI) myocardial infarction: Principal | ICD-10-CM | POA: Diagnosis present

## 2021-10-30 DIAGNOSIS — J45909 Unspecified asthma, uncomplicated: Secondary | ICD-10-CM | POA: Diagnosis not present

## 2021-10-30 DIAGNOSIS — I10 Essential (primary) hypertension: Secondary | ICD-10-CM | POA: Diagnosis not present

## 2021-10-30 DIAGNOSIS — R778 Other specified abnormalities of plasma proteins: Secondary | ICD-10-CM | POA: Insufficient documentation

## 2021-10-30 DIAGNOSIS — Z853 Personal history of malignant neoplasm of breast: Secondary | ICD-10-CM | POA: Insufficient documentation

## 2021-10-30 DIAGNOSIS — Z87891 Personal history of nicotine dependence: Secondary | ICD-10-CM | POA: Diagnosis not present

## 2021-10-30 DIAGNOSIS — Z79899 Other long term (current) drug therapy: Secondary | ICD-10-CM | POA: Insufficient documentation

## 2021-10-30 DIAGNOSIS — R0602 Shortness of breath: Secondary | ICD-10-CM | POA: Insufficient documentation

## 2021-10-30 DIAGNOSIS — R079 Chest pain, unspecified: Secondary | ICD-10-CM | POA: Diagnosis present

## 2021-10-30 DIAGNOSIS — Z7901 Long term (current) use of anticoagulants: Secondary | ICD-10-CM | POA: Diagnosis not present

## 2021-10-30 DIAGNOSIS — E876 Hypokalemia: Secondary | ICD-10-CM | POA: Diagnosis not present

## 2021-10-30 DIAGNOSIS — D649 Anemia, unspecified: Secondary | ICD-10-CM | POA: Diagnosis present

## 2021-10-30 DIAGNOSIS — R1909 Other intra-abdominal and pelvic swelling, mass and lump: Secondary | ICD-10-CM | POA: Insufficient documentation

## 2021-10-30 DIAGNOSIS — E785 Hyperlipidemia, unspecified: Secondary | ICD-10-CM

## 2021-10-30 DIAGNOSIS — K219 Gastro-esophageal reflux disease without esophagitis: Secondary | ICD-10-CM | POA: Diagnosis present

## 2021-10-30 DIAGNOSIS — I2510 Atherosclerotic heart disease of native coronary artery without angina pectoris: Secondary | ICD-10-CM | POA: Diagnosis not present

## 2021-10-30 LAB — CBC
HCT: 29.5 % — ABNORMAL LOW (ref 36.0–46.0)
Hemoglobin: 9.7 g/dL — ABNORMAL LOW (ref 12.0–15.0)
MCH: 31.8 pg (ref 26.0–34.0)
MCHC: 32.9 g/dL (ref 30.0–36.0)
MCV: 96.7 fL (ref 80.0–100.0)
Platelets: 219 10*3/uL (ref 150–400)
RBC: 3.05 MIL/uL — ABNORMAL LOW (ref 3.87–5.11)
RDW: 13.8 % (ref 11.5–15.5)
WBC: 11.1 10*3/uL — ABNORMAL HIGH (ref 4.0–10.5)
nRBC: 0 % (ref 0.0–0.2)

## 2021-10-30 LAB — BASIC METABOLIC PANEL
Anion gap: 7 (ref 5–15)
BUN: 29 mg/dL — ABNORMAL HIGH (ref 8–23)
CO2: 24 mmol/L (ref 22–32)
Calcium: 9 mg/dL (ref 8.9–10.3)
Chloride: 102 mmol/L (ref 98–111)
Creatinine, Ser: 0.97 mg/dL (ref 0.44–1.00)
GFR, Estimated: 59 mL/min — ABNORMAL LOW (ref >60)
Glucose, Bld: 122 mg/dL — ABNORMAL HIGH (ref 70–99)
Potassium: 4.2 mmol/L (ref 3.5–5.1)
Sodium: 133 mmol/L — ABNORMAL LOW (ref 135–145)

## 2021-10-30 LAB — TROPONIN I (HIGH SENSITIVITY)
Troponin I (High Sensitivity): 458 ng/L (ref <18)
Troponin I (High Sensitivity): 423 ng/L (ref <18)

## 2021-10-30 LAB — D-DIMER, QUANTITATIVE: D-Dimer, Quant: 0.93 ug/mL-FEU — ABNORMAL HIGH (ref 0.00–0.50)

## 2021-10-30 LAB — BRAIN NATRIURETIC PEPTIDE: B Natriuretic Peptide: 435 pg/mL — ABNORMAL HIGH (ref 0.0–100.0)

## 2021-10-30 MED ORDER — LEVOTHYROXINE SODIUM 25 MCG PO TABS
25.0000 ug | ORAL_TABLET | Freq: Every day | ORAL | Status: DC
Start: 2021-10-31 — End: 2021-11-01
  Administered 2021-10-31 – 2021-11-01 (×2): 25 ug via ORAL
  Filled 2021-10-30 (×2): qty 1

## 2021-10-30 MED ORDER — RIVAROXABAN 20 MG PO TABS
20.0000 mg | ORAL_TABLET | Freq: Every day | ORAL | Status: DC
  Administered 2021-10-31: 20 mg via ORAL
  Filled 2021-10-30: qty 1

## 2021-10-30 MED ORDER — AMLODIPINE BESYLATE 5 MG PO TABS
5.0000 mg | ORAL_TABLET | Freq: Once | ORAL | Status: AC
Start: 2021-10-30 — End: 2021-10-30
  Administered 2021-10-30: 5 mg via ORAL
  Filled 2021-10-30: qty 1

## 2021-10-30 MED ORDER — PANTOPRAZOLE SODIUM 40 MG PO TBEC
40.0000 mg | DELAYED_RELEASE_TABLET | Freq: Every day | ORAL | Status: DC
  Administered 2021-10-31 – 2021-11-01 (×2): 40 mg via ORAL
  Filled 2021-10-30 (×2): qty 1

## 2021-10-30 MED ORDER — HYDRALAZINE HCL 20 MG/ML IJ SOLN
10.0000 mg | INTRAMUSCULAR | Status: DC | PRN
Start: 2021-10-30 — End: 2021-11-01

## 2021-10-30 MED ORDER — ONDANSETRON HCL 4 MG/2ML IJ SOLN: 4.0000 mg | Freq: Four times a day (QID) | INTRAMUSCULAR | Status: DC | PRN

## 2021-10-30 MED ORDER — ROSUVASTATIN CALCIUM 5 MG PO TABS
5.0000 mg | ORAL_TABLET | Freq: Every day | ORAL | Status: DC
  Administered 2021-10-31: 5 mg via ORAL
  Filled 2021-10-30 (×2): qty 1

## 2021-10-30 MED ORDER — ACETAMINOPHEN 325 MG PO TABS
650.0000 mg | ORAL_TABLET | ORAL | Status: DC | PRN
  Administered 2021-10-31 (×2): 650 mg via ORAL
  Filled 2021-10-30 (×2): qty 2

## 2021-10-30 MED ORDER — IBUPROFEN 400 MG PO TABS
400.0000 mg | ORAL_TABLET | Freq: Once | ORAL | Status: AC
Start: 2021-10-30 — End: 2021-10-30
  Administered 2021-10-30: 400 mg via ORAL
  Filled 2021-10-30: qty 1

## 2021-10-30 MED ORDER — IOHEXOL 300 MG/ML  SOLN
100.0000 mL | Freq: Once | INTRAMUSCULAR | Status: AC | PRN
  Administered 2021-10-30: 100 mL via INTRAVENOUS

## 2021-10-30 MED ORDER — HYDRALAZINE HCL 20 MG/ML IJ SOLN
10.0000 mg | Freq: Once | INTRAMUSCULAR | Status: AC
  Administered 2021-10-30: 10 mg via INTRAVENOUS
  Filled 2021-10-30: qty 1

## 2021-10-30 MED ORDER — ASPIRIN 81 MG PO CHEW
324.0000 mg | CHEWABLE_TABLET | Freq: Once | ORAL | Status: AC
Start: 2021-10-30 — End: 2021-10-30
  Administered 2021-10-30: 324 mg via ORAL
  Filled 2021-10-30: qty 4

## 2021-10-30 MED ORDER — DIPHENHYDRAMINE HCL 25 MG PO CAPS
25.0000 mg | ORAL_CAPSULE | Freq: Every day | ORAL | Status: DC | PRN
  Administered 2021-11-01: 25 mg via ORAL
  Filled 2021-10-30: qty 1

## 2021-10-30 MED ORDER — AMIODARONE HCL 200 MG PO TABS
200.0000 mg | ORAL_TABLET | Freq: Every day | ORAL | Status: DC
  Administered 2021-10-31 – 2021-11-01 (×2): 200 mg via ORAL
  Filled 2021-10-30 (×2): qty 1

## 2021-10-30 MED ORDER — METOPROLOL SUCCINATE ER 50 MG PO TB24
50.0000 mg | ORAL_TABLET | Freq: Every day | ORAL | Status: DC
  Administered 2021-10-31 – 2021-11-01 (×2): 50 mg via ORAL
  Filled 2021-10-30 (×2): qty 1

## 2021-10-30 NOTE — ED Notes (Addendum)
Date and time results received: 10/30/21 0916   Test: troponin Critical Value: 458  Name of Provider Notified: Tomi Bamberger  Orders Received? Or Actions Taken?: NA

## 2021-10-30 NOTE — ED Triage Notes (Signed)
Pt referred by her Dr to come to be seen for chest pain and SOB for today.

## 2021-10-30 NOTE — ED Notes (Signed)
Pt reports sob with any activity

## 2021-10-30 NOTE — ED Provider Notes (Signed)
[facility] Provider Note   CSN: [id number] Arrival date & time: 10/30/21  1717     History  Chief Complaint  Patient presents with   Chest Pain    [name] is a 81 y.o. female.   Chest Pain   Patient has a history of coronary artery disease, bronchitis, migraines, panic attacks, GERD, hypertension, persistent A-fib status post ablation procedure performed on July 3.  Patient states she has been having issues with chronic shortness of breath felt to be associated with atrial fibrillation.  Patient states she underwent an ablation procedure on July 3.  Patient feels that her breathing is not any better than it was before she had the procedure.  Patient understood that the ablation procedure was supposed to help her with her breathing.  Patient does still feel short of breath when she ambulates.  Patient also started having some pain in her back.  This is new for her.  She called the cardiologist office and was instructed to come to the ED.  Patient does not have any fevers or chills.  No leg swelling.  Patient's not having any abdominal pain or discomfort but has noticed some swelling and bruising in her groin area where they performed her cardiac catheterization.  Home Medications Prior to Admission medications   Medication Sig Start Date End Date Taking? Authorizing Provider  albuterol (VENTOLIN HFA) 108 (90 Base) MCG/ACT inhaler Inhale 1-2 puffs into the lungs every 6 (six) hours as needed for shortness of breath or wheezing.   Yes Satira Sark, MD  amiodarone (PACERONE) 200 MG tablet Take 2 tablets (400 mg total) by mouth 2 (two) times daily for 5 days, THEN 2 tablets (400 mg total) daily for 5 days, THEN 1 tablet (200 mg total) daily. Patient taking differently: 1 tablet by mouth (200 mg total) daily. 07/24/21 07/29/22 Yes Vickie Epley, MD  Camphor-Menthol-Methyl Sal (SALONPAS) 3.05-03-08 % PTCH Apply 1 patch topically at bedtime as needed (pain).    Yes [provider]  colchicine 0.6 MG tablet Take 1 tablet (0.6 mg total) by mouth 2 (two) times daily for 5 days. 10/28/21 11/02/21 Yes Baldwin Jamaica, PA-C  diphenhydrAMINE (BENADRYL) 25 MG tablet Take 25 mg by mouth daily as needed for allergies.   Yes [provider]  docusate sodium (COLACE) 100 MG capsule Take 100 mg by mouth daily as needed for mild constipation.   Yes [provider]  HYDROcodone-acetaminophen (NORCO/VICODIN) 5-325 MG tablet Take 1 tablet by mouth every 8 (eight) hours as needed for moderate pain (back pain.). 03/16/21  Yes [provider]  levothyroxine (SYNTHROID) 25 MCG tablet Take 25 mcg by mouth daily before breakfast. 10/11/21  Yes [provider]  metoprolol succinate (TOPROL XL) 100 MG 24 hr tablet Take 0.5 daily with or immediately following a meal. Patient taking differently: 50 mg daily.  immediately following a meal. 08/20/21  Yes Fenton, Clint R, PA  nitroGLYCERIN (NITROSTAT) 0.4 MG SL tablet Place 0.4 mg under the tongue every 5 (five) minutes as needed for chest pain.   Yes [provider]  pantoprazole (PROTONIX) 40 MG tablet Take 1 tablet (40 mg total) by mouth daily. 10/28/21 12/12/21 Yes Baldwin Jamaica, PA-C  rivaroxaban (XARELTO) 20 MG TABS tablet Take 1 tablet (20 mg total) by mouth daily with supper. 03/29/21  Yes Barton Dubois, MD  rosuvastatin (CRESTOR) 5 MG tablet TAKE ONE TABLET BY MOUTH DAILY 02/04/21  Yes Satira Sark, MD  omeprazole (PRILOSEC) 20 MG capsule Take 20 mg by mouth daily as needed (indigestion/heartburn). Patient not taking: Reported on 10/30/2021 07/25/20   [provider]      Allergies    Penicillins and Eliquis [apixaban]    Review of Systems   Review of Systems  Cardiovascular:  Positive for chest pain.    Physical Exam Updated Vital Signs BP (!) 174/63   Pulse 63   Temp 98 F (36.7 C) (Oral)   Resp 20   Ht 1.664 m (5' 5.5")   Wt 69.9 kg   SpO2 99%    BMI 25.24 kg/m  Physical Exam Vitals and nursing note reviewed.  Constitutional:      General: She is not in acute distress.    Appearance: She is well-developed.  HENT:     Head: Normocephalic and atraumatic.     Right Ear: External ear normal.     Left Ear: External ear normal.  Eyes:     General: No scleral icterus.       Right eye: No discharge.        Left eye: No discharge.     Conjunctiva/sclera: Conjunctivae normal.  Neck:     Trachea: No tracheal deviation.  Cardiovascular:     Rate and Rhythm: Normal rate and regular rhythm.  Pulmonary:     Effort: Pulmonary effort is normal. No respiratory distress.     Breath sounds: Normal breath sounds. No stridor. No wheezing or rales.  Abdominal:     General: Bowel sounds are normal. There is no distension.     Palpations: Abdomen is soft.     Tenderness: There is no abdominal tenderness. There is no guarding or rebound.     Comments: Hematoma noted lower abdomen, inguinal region, no significant swelling  Musculoskeletal:        General: No tenderness or deformity.     Cervical back: Neck supple.  Skin:    General: Skin is warm and dry.     Findings: No rash.  Neurological:     General: No focal deficit present.     Mental Status: She is alert.     Cranial Nerves: No cranial nerve deficit (no facial droop, extraocular movements intact, no slurred speech).     Sensory: No sensory deficit.     Motor: No abnormal muscle tone or seizure activity.     Coordination: Coordination normal.  Psychiatric:        Mood and Affect: Mood normal.     ED Results / Procedures / Treatments   Labs (all labs ordered are listed, but only abnormal results are displayed) Labs Reviewed  BASIC METABOLIC PANEL - Abnormal; Notable for the following components:      Result Value   Sodium 133 (*)    Glucose, Bld 122 (*)    BUN 29 (*)    GFR, Estimated 59 (*)    All other components within normal limits  CBC - Abnormal; Notable for the  following components:   WBC 11.1 (*)    RBC 3.05 (*)    Hemoglobin 9.7 (*)    HCT 29.5 (*)    All other components within normal limits  D-DIMER, QUANTITATIVE - Abnormal; Notable for the following components:   D-Dimer, Quant 0.93 (*)    All other components within normal limits  BRAIN NATRIURETIC PEPTIDE - Abnormal; Notable for the following components:   B Natriuretic Peptide 435.0 (*)    All other components within normal limits  TROPONIN I (HIGH SENSITIVITY) - Abnormal; Notable for the following components:   Troponin I (High Sensitivity) 458 (*)    All other components within normal limits  TROPONIN I (HIGH SENSITIVITY) - Abnormal; Notable for the following components:   Troponin I (High Sensitivity) 423 (*)    All other components within normal limits    EKG EKG Interpretation  Date/Time:  Wednesday October 30 2021 17:45:44 EDT Ventricular Rate:  64 PR Interval:  196 QRS Duration: 100 QT Interval:  440 QTC Calculation: 161 R Axis:   96 Text Interpretation: Sinus rhythm Right axis deviation Low voltage, precordial leads No significant change since last tracing Confirmed by Dorie Rank [phone]) on 10/30/2021 5:50:56 PM  Radiology CT Angio Chest PE W and/or Wo Contrast  Result Date: 10/30/2021 CLINICAL DATA:  Pulmonary embolism suspected, positive D-dimer. Recent atrial fibrillation ablation. Chest pain and shortness of breath. Retroperitoneal bleed suspected. EXAM: CT ANGIOGRAPHY CHEST CT ABDOMEN AND PELVIS WITH CONTRAST TECHNIQUE: Multidetector CT imaging of the chest was performed using the standard protocol during bolus administration of intravenous contrast. Multiplanar CT image reconstructions and MIPs were obtained to evaluate the vascular anatomy. Multidetector CT imaging of the abdomen and pelvis was performed using the standard protocol during bolus administration of intravenous contrast. RADIATION DOSE REDUCTION: This exam was performed according to the departmental  dose-optimization program which includes automated exposure control, adjustment of the mA and/or kV according to patient size and/or use of iterative reconstruction technique. CONTRAST:  17m OMNIPAQUE IOHEXOL 300 MG/ML  SOLN COMPARISON:  10/21/2021, 10/22/2020. FINDINGS: CTA CHEST FINDINGS Cardiovascular: The heart is borderline enlarged and there is a trace pericardial effusion. Scattered coronary artery calcifications are noted. There is atherosclerotic calcification of the aorta without evidence of aneurysm. The pulmonary trunk is normal in caliber. No pulmonary artery filling defect is identified. Mediastinum/Nodes: Shotty lymph nodes are present in the mediastinum. No hilar or axillary lymphadenopathy. The thyroid gland, trachea, and esophagus are within normal limits. Lungs/Pleura: Emphysematous changes are present in the lungs. Subpleural reticulation and interlobular septal thickening are noted. There are small bilateral pleural effusions with atelectasis. No pneumothorax. Musculoskeletal: A left breast implant is noted. Mastectomy changes with breast reconstruction are noted on the right. Degenerative changes are present in the thoracic spine. No acute osseous abnormality. Review of the MIP images confirms the above findings. CT ABDOMEN and PELVIS FINDINGS Hepatobiliary: Stable scattered cysts are present in the liver. No biliary ductal dilatation. The gallbladder is surgically absent. Pancreas: Unremarkable. No pancreatic ductal dilatation or surrounding inflammatory changes. Spleen: Normal in size without focal abnormality. Adrenals/Urinary Tract: No adrenal nodule or mass. The kidneys enhance symmetrically. No renal calculus or hydronephrosis. Subcentimeter hypodensities are noted in the kidneys bilaterally which are too small to further characterize. The visualized portion of the urinary bladder is within normal limits. Examination is limited due to hardware artifact. Stomach/Bowel: Stomach is within  normal limits. Appendix appears normal. No evidence of bowel wall thickening, distention, or inflammatory changes. No free air or pneumatosis. Scattered diverticula are present along the colon without evidence of diverticulitis. Vascular/Lymphatic: Aortic atherosclerosis. No enlarged abdominal or pelvic lymph nodes. Reproductive: Status post hysterectomy. No adnexal masses. Other: A trace amount of free fluid is noted in the pelvis on the right. Musculoskeletal: Degenerative changes are present in the lumbar spine. Bilateral hip arthroplasty changes are noted. Lumbar spinal fusion hardware and laminectomy changes are present in the lower lumbar spine. Review of the MIP images confirms the above findings. IMPRESSION: 1. No  evidence of pulmonary embolism. 2. Small bilateral pleural effusions with atelectasis. 3. Emphysema with fibrotic changes. 4. No acute process in the abdomen and pelvis. 5. Aortic atherosclerosis and coronary artery calcifications. Electronically Signed   By: Brett Fairy M.D.   On: 10/30/2021 20:24   CT ABDOMEN PELVIS W CONTRAST  Result Date: 10/30/2021 CLINICAL DATA:  Pulmonary embolism suspected, positive D-dimer. Recent atrial fibrillation ablation. Chest pain and shortness of breath. Retroperitoneal bleed suspected. EXAM: CT ANGIOGRAPHY CHEST CT ABDOMEN AND PELVIS WITH CONTRAST TECHNIQUE: Multidetector CT imaging of the chest was performed using the standard protocol during bolus administration of intravenous contrast. Multiplanar CT image reconstructions and MIPs were obtained to evaluate the vascular anatomy. Multidetector CT imaging of the abdomen and pelvis was performed using the standard protocol during bolus administration of intravenous contrast. RADIATION DOSE REDUCTION: This exam was performed according to the departmental dose-optimization program which includes automated exposure control, adjustment of the mA and/or kV according to patient size and/or use of iterative  reconstruction technique. CONTRAST:  179m OMNIPAQUE IOHEXOL 300 MG/ML  SOLN COMPARISON:  10/21/2021, 10/22/2020. FINDINGS: CTA CHEST FINDINGS Cardiovascular: The heart is borderline enlarged and there is a trace pericardial effusion. Scattered coronary artery calcifications are noted. There is atherosclerotic calcification of the aorta without evidence of aneurysm. The pulmonary trunk is normal in caliber. No pulmonary artery filling defect is identified. Mediastinum/Nodes: Shotty lymph nodes are present in the mediastinum. No hilar or axillary lymphadenopathy. The thyroid gland, trachea, and esophagus are within normal limits. Lungs/Pleura: Emphysematous changes are present in the lungs. Subpleural reticulation and interlobular septal thickening are noted. There are small bilateral pleural effusions with atelectasis. No pneumothorax. Musculoskeletal: A left breast implant is noted. Mastectomy changes with breast reconstruction are noted on the right. Degenerative changes are present in the thoracic spine. No acute osseous abnormality. Review of the MIP images confirms the above findings. CT ABDOMEN and PELVIS FINDINGS Hepatobiliary: Stable scattered cysts are present in the liver. No biliary ductal dilatation. The gallbladder is surgically absent. Pancreas: Unremarkable. No pancreatic ductal dilatation or surrounding inflammatory changes. Spleen: Normal in size without focal abnormality. Adrenals/Urinary Tract: No adrenal nodule or mass. The kidneys enhance symmetrically. No renal calculus or hydronephrosis. Subcentimeter hypodensities are noted in the kidneys bilaterally which are too small to further characterize. The visualized portion of the urinary bladder is within normal limits. Examination is limited due to hardware artifact. Stomach/Bowel: Stomach is within normal limits. Appendix appears normal. No evidence of bowel wall thickening, distention, or inflammatory changes. No free air or pneumatosis.  Scattered diverticula are present along the colon without evidence of diverticulitis. Vascular/Lymphatic: Aortic atherosclerosis. No enlarged abdominal or pelvic lymph nodes. Reproductive: Status post hysterectomy. No adnexal masses. Other: A trace amount of free fluid is noted in the pelvis on the right. Musculoskeletal: Degenerative changes are present in the lumbar spine. Bilateral hip arthroplasty changes are noted. Lumbar spinal fusion hardware and laminectomy changes are present in the lower lumbar spine. Review of the MIP images confirms the above findings. IMPRESSION: 1. No evidence of pulmonary embolism. 2. Small bilateral pleural effusions with atelectasis. 3. Emphysema with fibrotic changes. 4. No acute process in the abdomen and pelvis. 5. Aortic atherosclerosis and coronary artery calcifications. Electronically Signed   By: LBrett FairyM.D.   On: 10/30/2021 20:24   DG Chest Port 1 View  Result Date: 10/30/2021 CLINICAL DATA:  Chest pain, shortness of breath EXAM: PORTABLE CHEST 1 VIEW COMPARISON:  03/27/2021 FINDINGS: Chronic interstitial changes. Increased  density at the periphery of the lower right lung. Stable cardiomediastinal contours. IMPRESSION: Increased patchy density at the periphery of the lower right lung may reflect atelectasis, scarring, or consolidation. Electronically Signed   By: Macy Mis M.D.   On: 10/30/2021 18:12    Procedures .Critical Care  Performed by: Dorie Rank, MD Authorized by: Dorie Rank, MD   Critical care provider statement:    Critical care time (minutes):  45   Critical care was time spent personally by me on the following activities:  Development of treatment plan with patient or surrogate, discussions with consultants, evaluation of patient's response to treatment, examination of patient, ordering and review of laboratory studies, ordering and review of radiographic studies, ordering and performing treatments and interventions, pulse oximetry,  re-evaluation of patient's condition and review of old charts     Medications Ordered in ED Medications  ibuprofen (ADVIL) tablet 400 mg (has no administration in time range)  amLODipine (NORVASC) tablet 5 mg (5 mg Oral Given 10/30/21 1855)  iohexol (OMNIPAQUE) 300 MG/ML solution 100 mL (100 mLs Intravenous Contrast Given 10/30/21 2008)  aspirin chewable tablet 324 mg (324 mg Oral Given 10/30/21 2129)  hydrALAZINE (APRESOLINE) injection 10 mg (10 mg Intravenous Given 10/30/21 2157)    ED Course/ Medical Decision Making/ A&P Clinical Course as of 10/30/21 2228  Wed Oct 30, 2021  1845 CBC(!) Hemoglobin dropped 2 points since 3 weeks ago [JK]  1938 D-dimer, quantitative(!) D-dimer elevated at 0.93 [JK]  7408 Basic metabolic panel(!) Metabolic panel shows elevated BUN [JK]  1939 Troponin I (High Sensitivity)(!!) Troponin elevated at 458 [JK]  1939 Brain natriuretic peptide(!) BNP elevated 435 [JK]  1939 Patient having chest pain.  Concerned about the possibility of ACS but she does have a two-point drop in her hemoglobin.  With her cath and increased bruising in her abdomen,  [JK]  1939 Dilation right now pnd drop in hemoglobin I am concerned about the possibility of internal bleeding.  We will hold off on anticoagulation right now.  Patient is already on a NOAC [JK]  2055 CT Angio Chest PE W and/or Wo Contrast Chest CT without signs of pulmonary embolism.  No evidence of intra-abdominal bleeding. [JK]  2123 Troponin I (High Sensitivity)(!!) Second troponin decreased [JK]  2146 Case discussed with Dr. Cathie Hoops cardiology.  Suspects this is most likely hypertensive emergency.  Recommends admission to [facility] under the hospitalist service.  Dr. Cathie Hoops will see the patient this evening.  Patient's blood pressure has decreased with oral blood pressure medications but I will order additional medications and consult with the medical service for admission. [JK]  2227 Dr. To new called back and  suggested a dose of ibuprofen.  The electrophysiologist thought patient could also be having pericarditis associated with her ablation procedure [JK]    Clinical Course User Index [JK] Dorie Rank, MD                           Medical Decision Making Problems Addressed: Anemia, unspecified type: undiagnosed new problem with uncertain prognosis Chest pain, unspecified type: acute illness or injury that poses a threat to life or bodily functions Elevated troponin: acute illness or injury that poses a threat to life or bodily functions Hypertensive emergency: acute illness or injury that poses a threat to life or bodily functions  Amount and/or Complexity of Data Reviewed Labs: ordered. Decision-making details documented in ED Course. Radiology: ordered. Decision-making details documented in ED  Course.  Risk OTC drugs. Prescription drug management. Decision regarding hospitalization.   Patient presented to the ED for evaluation of chest pain, shortness of breath after recent ablation for atrial fibrillation.  Patient also has noticed some bruising in her lower abdomen and some lower abdominal pain.  Patient today started having thoracic back pain.  ED work-up does show elevated troponins but they are not increasing.  Patient does have known history of coronary artery disease.  Patient also noted to be severely hypertensive initially.  She only takes Toprol normally.  Patient's hemoglobin has decreased.  I was concerned about the possibility of pulmonary embolism with her chest pain and shortness of breath as well as possible left retroperitoneal hematoma with her declining hemoglobin and recent vascular procedure.  Fortunately no signs of PE and no signs of intra-abdominal or retroperitoneal bleeding.  Patient is on anticoagulation already.  I have ordered oral and IV antihypertensive agents.  Will consider nitroglycerin drip if her blood pressure remains elevated.  Have consulted with cardiology  and we will plan on admission to [facility] under the hospitalist service.       Final Clinical Impression(s) / ED Diagnoses Final diagnoses:  Chest pain, unspecified type  Elevated troponin  Anemia, unspecified type  Hypertensive emergency    Rx / DC Orders ED Discharge Orders     None         Dorie Rank, MD 10/30/21 2228

## 2021-10-30 NOTE — ED Notes (Signed)
Pt got very out of breath after taking off pants to look at insertion sites from ablation performed on Monday.  Some bruising at insertions sites, but more bruising seen on lower abdomen where pt said she was bandaged

## 2021-10-30 NOTE — ED Notes (Signed)
Patient transported to CT 

## 2021-10-30 NOTE — Telephone Encounter (Signed)
Pt c/o Shortness Of Breath: STAT if SOB developed within the last 24 hours or pt is noticeably SOB on the phone  1. Are you currently SOB (can you hear that pt is SOB on the phone)?  No  2. How long have you been experiencing SOB?  Since she came home from the hospital on Monday  3. Are you SOB when sitting or when up moving around? Moving aroung  4. Are you currently experiencing any other symptoms?  No   Patient stated she gets very out of breath when she is walking around.  Please advise.

## 2021-10-30 NOTE — ED Notes (Signed)
Pt went to CT and upon returning pt stated that she felt SOB. This RN assessed pt and O2 ranging from 88-92% on RA. Pt placed on 2L [place] and oxygen now at 95%.

## 2021-10-30 NOTE — Telephone Encounter (Signed)
Received a call from patient she stated she has been having sob,pain in back of neck and across shoulder blades off and on all day.No chest pain.Stated her lower abdomen is swollen and tight.Stated sob is worse she can hardly walk from room to room.Advised she needs to go to ED to be evaluated.I will make Dr.Lambert and his RN aware.

## 2021-10-31 ENCOUNTER — Observation Stay (HOSPITAL_BASED_OUTPATIENT_CLINIC_OR_DEPARTMENT_OTHER): Payer: Medicare Other

## 2021-10-31 DIAGNOSIS — I340 Nonrheumatic mitral (valve) insufficiency: Secondary | ICD-10-CM

## 2021-10-31 DIAGNOSIS — I161 Hypertensive emergency: Secondary | ICD-10-CM | POA: Diagnosis not present

## 2021-10-31 DIAGNOSIS — D649 Anemia, unspecified: Secondary | ICD-10-CM

## 2021-10-31 DIAGNOSIS — R0602 Shortness of breath: Secondary | ICD-10-CM | POA: Diagnosis not present

## 2021-10-31 DIAGNOSIS — E785 Hyperlipidemia, unspecified: Secondary | ICD-10-CM

## 2021-10-31 DIAGNOSIS — I4819 Other persistent atrial fibrillation: Secondary | ICD-10-CM | POA: Diagnosis not present

## 2021-10-31 DIAGNOSIS — E039 Hypothyroidism, unspecified: Secondary | ICD-10-CM

## 2021-10-31 DIAGNOSIS — I214 Non-ST elevation (NSTEMI) myocardial infarction: Principal | ICD-10-CM

## 2021-10-31 DIAGNOSIS — I482 Chronic atrial fibrillation, unspecified: Secondary | ICD-10-CM | POA: Diagnosis not present

## 2021-10-31 DIAGNOSIS — K219 Gastro-esophageal reflux disease without esophagitis: Secondary | ICD-10-CM

## 2021-10-31 LAB — ECHOCARDIOGRAM COMPLETE
Area-P 1/2: 3.95 cm2
Height: 65 in
S' Lateral: 2.5 cm
Weight: 2546.75 oz

## 2021-10-31 LAB — RETICULOCYTES
Immature Retic Fract: 15.2 % (ref 2.3–15.9)
RBC.: 3.22 MIL/uL — ABNORMAL LOW (ref 3.87–5.11)
Retic Count, Absolute: 57.3 10*3/uL (ref 19.0–186.0)
Retic Ct Pct: 1.8 % (ref 0.4–3.1)

## 2021-10-31 LAB — IRON AND TIBC
Iron: 42 ug/dL (ref 28–170)
Saturation Ratios: 13 % (ref 10.4–31.8)
TIBC: 333 ug/dL (ref 250–450)
UIBC: 291 ug/dL

## 2021-10-31 LAB — LIPID PANEL
Cholesterol: 128 mg/dL (ref 0–200)
HDL: 61 mg/dL (ref >40)
LDL Cholesterol: 56 mg/dL (ref 0–99)
Total CHOL/HDL Ratio: 2.1 RATIO
Triglycerides: 57 mg/dL (ref <150)
VLDL: 11 mg/dL (ref 0–40)

## 2021-10-31 LAB — FOLATE: Folate: 12.7 ng/mL (ref >5.9)

## 2021-10-31 LAB — VITAMIN B12: Vitamin B-12: 281 pg/mL (ref 180–914)

## 2021-10-31 LAB — TSH: TSH: 1.718 u[IU]/mL (ref 0.350–4.500)

## 2021-10-31 LAB — TROPONIN I (HIGH SENSITIVITY): Troponin I (High Sensitivity): 403 ng/L (ref <18)

## 2021-10-31 LAB — FERRITIN: Ferritin: 79 ng/mL (ref 11–307)

## 2021-10-31 MED ORDER — SUCRALFATE 1 G PO TABS
1.0000 g | ORAL_TABLET | Freq: Three times a day (TID) | ORAL | Status: DC
  Administered 2021-10-31 – 2021-11-01 (×3): 1 g via ORAL
  Filled 2021-10-31 (×6): qty 1

## 2021-10-31 MED ORDER — COLCHICINE 0.6 MG PO TABS
0.6000 mg | ORAL_TABLET | Freq: Two times a day (BID) | ORAL | Status: DC
Start: 2021-10-31 — End: 2021-11-01
  Administered 2021-10-31 – 2021-11-01 (×3): 0.6 mg via ORAL
  Filled 2021-10-31 (×3): qty 1

## 2021-10-31 MED ORDER — FUROSEMIDE 10 MG/ML IJ SOLN
40.0000 mg | Freq: Once | INTRAMUSCULAR | Status: AC
  Administered 2021-10-31: 40 mg via INTRAVENOUS
  Filled 2021-10-31: qty 4

## 2021-10-31 MED ORDER — IBUPROFEN 600 MG PO TABS: 600.0000 mg | ORAL_TABLET | Freq: Four times a day (QID) | ORAL | Status: DC | PRN

## 2021-10-31 MED ORDER — AMLODIPINE BESYLATE 5 MG PO TABS
5.0000 mg | ORAL_TABLET | Freq: Every day | ORAL | Status: DC
Start: 2021-10-31 — End: 2021-11-01
  Administered 2021-10-31 – 2021-11-01 (×2): 5 mg via ORAL
  Filled 2021-10-31 (×2): qty 1

## 2021-10-31 MED ORDER — METOPROLOL TARTRATE 5 MG/5ML IV SOLN
5.0000 mg | Freq: Once | INTRAVENOUS | Status: AC
  Administered 2021-10-31: 5 mg via INTRAVENOUS
  Filled 2021-10-31: qty 5

## 2021-10-31 NOTE — Care Management Obs Status (Signed)
[facility] [facility] NOTIFICATION   Patient Details  Name: [name] MRN: [id number] Date of Birth: July 29, 1940   Medicare Observation Status Notification Given:  Yes    Zenon Mayo, RN 10/31/2021, 4:34 PM

## 2021-10-31 NOTE — Assessment & Plan Note (Signed)
-   The setting of hypertensive emergency, check TSH -Continue Synthroid

## 2021-10-31 NOTE — Consult Note (Signed)
Cardiology Consultation:   Patient ID: [name] MRN: [id number]; DOB: Sep 29, 1940  Admit date: 10/30/2021 Date of Consult: 10/31/2021  PCP:  Pablo Lawrence, NP   [facility] Providers Cardiologist:  Rozann Lesches, MD  Electrophysiologist:  Vickie Epley, MD  {    Patient Profile:   [name] is a 81 y.o. female with a hx of persistent AFib, HTN, HLD, CAD (non-obstructive by cath 2021) who is being seen 10/31/2021 for the evaluation of SOB at the request of Dr. Louanne Belton.  History of Present Illness:   Ms. Balan came to the ER with c/o SOB, CP with concerns of ongoing symptoms post ablation on 10/28/21 she became worried and came in for evaluation. She was admitted, cardiology/EP consulted.  LABS K+ 4.2 BUN/Creat 29/0.97 BNP 435 HS Trop 458, 423, 403 WBC 11.8 H/H 9.7/29.5 Plts 219  She was hypertensive, otherwise VSS  CT noted no PE, small b/l pleural effusions, emphysema Echo with LVEF 60-65%, no WMA, no pericardial effusion  She reports that with her Afib she does get winded, though felt well initially after her procedure then started to feel SOB. This AM she got up to the the bathroom and felt winded just doing that, this level of exertion is unusual for her She denies any ongoing CP   Past Medical History:  Diagnosis Date   Anemia    Arthritis    Breast cancer ([place])    Right mastectomy   Chronic back pain    Coronary atherosclerosis of native coronary artery    a. 12/2019 Cath: LM nl, LAD 47m LCX 50p, 80d, RCA 20p, 418m>med rx.   [place]   a. 02/2021 HsTrop to 361 in setting of rapid afib; b. 02/2021 Echo: EF 65-70%, no rwma, mild LVH, RVSP 28.60m25m. Mildly to mod dil LA. Triv MR. Ao sclerosis w/o stenosis.   Environmental allergies    Essential hypertension    GERD (gastroesophageal reflux disease)    History of bronchitis    History of colon polyps    History of diverticulosis    History of migraine    Panic attacks     [place]   a. CHA2DS2VASc = 5-->xarelto.   Prolapse of vaginal vault after hysterectomy    Seasonal allergies    Urinary frequency     Past Surgical History:  Procedure Laterality Date   ABDOMINAL HYSTERECTOMY     ATRIAL FIBRILLATION ABLATION N/A 10/28/2021   Procedure: ATRIAL FIBRILLATION ABLATION;  Surgeon: LamVickie EpleyD;  Location: [facility];  Service: Cardiovascular;  Laterality: N/A;   BACK SURGERY  1981/2011/2014   BLADDER SUSPENSION     BREAST RECONSTRUCTION  2004   with abd tissue   BREAST SURGERY Right    CARDIOVERSION N/A 04/30/2021   Procedure: CARDIOVERSION;  Surgeon: BraArnoldo LenisD;  [facility]: [facility];  Service: Endoscopy;  Laterality: N/A;   CARDIOVERSION N/A 05/16/2021   Procedure: CARDIOVERSION;  Surgeon: BraArnoldo LenisD;  Location: [facility];  Service: Endoscopy;  Laterality: N/A;   CARDIOVERSION N/A 08/13/2021   Procedure: CARDIOVERSION;  Surgeon: SchDonato HeinzD;  [facility];  Service: Cardiovascular;  Laterality: N/A;   CHOLECYSTECTOMY     COLONOSCOPY  12/2009   RMR: few pancolonic diverticula. next TCS 12/2014   COLONOSCOPY  06/14/2007   RMRGUY:[id number] polyp, status post cold biopsy removal/ Anal canal hemorrhoids/Left-sided diverticula Colonic mucosa appeared normal.Hyperplastic polyp.    COLONOSCOPY  01/18/2004   FFM:[id number] diverticula/Internal hemorrhoids.  Otherwise normal rectum   COLONOSCOPY N/A 04/18/2013   Dr. Gala Romney- normal rectum, scattered left sided diverticula the remainder of the colonic mucosa appeared normal   COLONOSCOPY N/A 08/18/2014   Procedure: COLONOSCOPY;  Surgeon: Daneil Dolin, MD;  [facility];  Service: Endoscopy;  Laterality: N/A;  1000   ESOPHAGOGASTRODUODENOSCOPY  06/14/2007   DJT:[id number] plaques in esophageal mucosa of uncertain significance brushed and biopsied/Normal stomach, normal first duodenum and second duodenoscopy.  KOH negative. esophageal bx c/w GERD   ESOPHAGOGASTRODUODENOSCOPY N/A 08/18/2014   Procedure: ESOPHAGOGASTRODUODENOSCOPY (EGD);  Surgeon: Daneil Dolin, MD;  [facility];  Service: Endoscopy;  Laterality: N/A;   ESOPHAGOGASTRODUODENOSCOPY (EGD) WITH ESOPHAGEAL DILATION N/A 04/18/2013   Dr. Gala Romney- normal, patent appearing, tubular esophagus, normal gastric mucosa, paptent pylorus, normal first and second portion of the duodenum   EYE SURGERY     lasik   FOOT SURGERY Right 2014   d/t plantar fascitis   GIVENS CAPSULE STUDY N/A 07/18/2013   L.Lewis PAC- markedly abnormal appearing gastric mucosa, sugnificant bile reflux, questionable subucosal small bowel mass versus extrinsic compression in the distal small bowel.- CTE= no small bowel mass or tumor seen.   LEFT HEART CATH AND CORONARY ANGIOGRAPHY N/A 01/16/2020   Procedure: LEFT HEART CATH AND CORONARY ANGIOGRAPHY;  Surgeon: Burnell Blanks, MD;  Location: [place] [facility];  Service: Cardiovascular;  Laterality: N/A;   MALONEY DILATION N/A 08/18/2014   Procedure: Venia Minks DILATION;  Surgeon: Daneil Dolin, MD;  [facility];  Service: Endoscopy;  Laterality: N/A;   MASTECTOMY     right side 12-13 years ago   MOUTH SURGERY     cyst removed in June 2022.   TOTAL HIP ARTHROPLASTY Left 02/29/2016   Procedure: TOTAL HIP ARTHROPLASTY ANTERIOR APPROACH;  Surgeon: Frederik Pear, MD;  Location: [place];  Service: Orthopedics;  Laterality: Left;   TOTAL HIP ARTHROPLASTY Right 01/05/2017   Procedure: TOTAL HIP ARTHROPLASTY ANTERIOR APPROACH;  Surgeon: Frederik Pear, MD;  Location: [place];  Service: Orthopedics;  Laterality: Right;  REQUEST 90 MINS     Home Medications:  Prior to Admission medications   Medication Sig Start Date End Date Taking? Authorizing Provider  albuterol (VENTOLIN HFA) 108 (90 Base) MCG/ACT inhaler Inhale 1-2 puffs into the lungs every 6 (six) hours as needed for shortness of breath or wheezing.   Yes  Satira Sark, MD  amiodarone (PACERONE) 200 MG tablet Take 2 tablets (400 mg total) by mouth 2 (two) times daily for 5 days, THEN 2 tablets (400 mg total) daily for 5 days, THEN 1 tablet (200 mg total) daily. Patient taking differently: 1 tablet by mouth (200 mg total) daily. 07/24/21 07/29/22 Yes Vickie Epley, MD  Camphor-Menthol-Methyl Sal (SALONPAS) 3.05-03-08 % PTCH Apply 1 patch topically at bedtime as needed (pain).   Yes [provider]  colchicine 0.6 MG tablet Take 1 tablet (0.6 mg total) by mouth 2 (two) times daily for 5 days. 10/28/21 11/02/21 Yes Baldwin Jamaica, PA-C  diphenhydrAMINE (BENADRYL) 25 MG tablet Take 25 mg by mouth daily as needed for allergies.   Yes [provider]  docusate sodium (COLACE) 100 MG capsule Take 100 mg by mouth daily as needed for mild constipation.   Yes [provider]  HYDROcodone-acetaminophen (NORCO/VICODIN) 5-325 MG tablet Take 1 tablet by mouth every 8 (eight) hours as needed for moderate pain (back pain.). 03/16/21  Yes [provider]  levothyroxine (SYNTHROID) 25 MCG tablet Take 25 mcg by mouth daily before breakfast. 10/11/21  Yes [provider]  metoprolol succinate (TOPROL XL) 100 MG 24 hr tablet Take 0.5 daily with or immediately following a meal. Patient taking differently: 50 mg daily.  immediately following a meal. 08/20/21  Yes Fenton, Clint R, PA  nitroGLYCERIN (NITROSTAT) 0.4 MG SL tablet Place 0.4 mg under the tongue every 5 (five) minutes as needed for chest pain.   Yes [provider]  pantoprazole (PROTONIX) 40 MG tablet Take 1 tablet (40 mg total) by mouth daily. 10/28/21 12/12/21 Yes Baldwin Jamaica, PA-C  rivaroxaban (XARELTO) 20 MG TABS tablet Take 1 tablet (20 mg total) by mouth daily with supper. 03/29/21  Yes Barton Dubois, MD  rosuvastatin (CRESTOR) 5 MG tablet TAKE ONE TABLET BY MOUTH DAILY 02/04/21  Yes Satira Sark, MD  omeprazole (PRILOSEC) 20 MG capsule Take 20  mg by mouth daily as needed (indigestion/heartburn). Patient not taking: Reported on 10/30/2021 07/25/20   [provider]    Inpatient Medications: Scheduled Meds:  amiodarone  200 mg Oral Daily   amLODipine  5 mg Oral Daily   colchicine  0.6 mg Oral BID   levothyroxine  25 mcg Oral Q0600   metoprolol succinate  50 mg Oral Daily   pantoprazole  40 mg Oral Daily   rivaroxaban  20 mg Oral Q supper   rosuvastatin  5 mg Oral Daily   Continuous Infusions:  PRN Meds: acetaminophen, diphenhydrAMINE, hydrALAZINE, ibuprofen, ondansetron (ZOFRAN) IV  Allergies:    Allergies  Allergen Reactions   Penicillins Anaphylaxis, Swelling and Other (See Comments)    Blisters Has patient had a PCN reaction causing immediate rash, facial/tongue/throat swelling, SOB or lightheadedness with hypotension: Yes Has patient had a PCN reaction causing severe rash involving mucus membranes or skin necrosis: No Has patient had a PCN reaction that required hospitalization No Has patient had a PCN reaction occurring within the last 10 years: No If all of the above answers are "NO", then may proceed with Cephalosporin use.    Eliquis [Apixaban] Hives    Social History:   Social History   Socioeconomic History   Marital status: Widowed    Spouse name: [name]   Number of children: 4   Years of education: 12th   Highest education level: Not on file  Occupational History    Employer: RETIRED  Tobacco Use   Smoking status: Former    Packs/day: 1.50    Years: 30.00    Total pack years: 45.00    Types: Cigarettes    Quit date: 04/28/2000    Years since quitting: 21.5   Smokeless tobacco: Never   Tobacco comments:    Former smoker 07/30/21  Vaping Use   Vaping Use: Never used  Substance and Sexual Activity   Alcohol use: No   Drug use: No   Sexual activity: Not on file    Comment: hyst  Other Topics Concern   Not on file  Social History Narrative   Patient lives at home with spouse.    Caffeine Use: 2 cups of coffee daily   Social Determinants of Health   Financial Resource Strain: Not on file  Food Insecurity: Not on file  Transportation Needs: Not on file  Physical Activity: Not on file  Stress: Not on file  Social Connections: Not on file  Intimate Partner Violence: Not on file    Family History:   Family History  Problem Relation Age of Onset   Coronary artery disease Mother    Coronary artery disease Father    Cancer Sister        lung cancer, two sisters   Lung cancer Sister    Coronary artery disease Brother    Colon cancer Brother        less than age 11   Cancer Brother    Emphysema Brother    Diabetes Son    Healthy Son    Healthy Son    Healthy Son    Healthy Son      ROS:  Please see the history of present illness.  All other ROS reviewed and negative.     Physical Exam/Data:   Vitals:   10/31/21 0117 10/31/21 0326 10/31/21 0723 10/31/21 1122  BP: (!) 150/83 138/69 128/69 136/81  Pulse: (!) 117 79 61 89  Resp:  '18 18 18  '$ Temp:  97.6 F (36.4 C) 97.7 F (36.5 C) 97.8 F (36.6 C)  TempSrc:  Oral Oral Oral  SpO2:  99% 96% 90%  Weight:      Height:        Intake/Output Summary (Last 24 hours) at 10/31/2021 1211 Last data filed at 10/31/2021 1124 Gross per 24 hour  Intake --  Output 2100 ml  Net -2100 ml      10/31/2021   12:03 AM 10/30/2021   11:53 PM 10/30/2021    5:47 PM  Last 3 Weights  Weight (lbs) 159 lb 2.8 oz 159 lb 2.8 oz 154 lb  Weight (kg) 72.2 kg 72.2 kg 69.854 kg     Body mass index is 26.49 kg/m.  General:  Well nourished, well developed, in no acute distress HEENT: normal Neck: no JVD Vascular: No carotid bruits; Distal pulses 2+ bilaterally Cardiac:  irreg-irreg; no murmurs, gallops or rubs Lungs:  crackles at the bases, otherwise CTA b/l, no wheezing, rhonchi or rales  Abd: soft, nontender Ext: no edema Musculoskeletal:  No deformities Skin: warm and dry  Neuro:  no focal abnormalities noted Psych:   Normal affect   EKG:  The EKG was personally reviewed and demonstrates:   SR 62, no ST/T changes SR 64bpm, no changes  Telemetry:  Telemetry was personally reviewed and demonstrates:   Arrived in SR > AFib currently 90's-110's  Relevant CV Studies:  10/31/21: TTE IMPRESSIONS   1. No significant pericardial effusion.   2. Left ventricular ejection fraction, by estimation, is 60 to 65%. The  left ventricle has normal function. The left ventricle has no regional  wall motion abnormalities. Left ventricular diastolic parameters are  indeterminate.   3. Right ventricular systolic function is normal. The right ventricular  size is normal.   4. Left atrial size was mildly dilated.   5. The mitral valve is normal in structure. Mild mitral valve  regurgitation. No evidence of mitral stenosis.   6. The aortic valve is normal in structure. Aortic valve regurgitation is  not visualized. No aortic stenosis is present.   7. The inferior vena cava is normal in size with greater than 50%  respiratory variability, suggesting right atrial pressure of 3 mmHg.    10/28/21; EPS/ablation CONCLUSIONS: 1. Successful PVI 2. Successful ablation/isolation of the posterior wall 3. Intracardiac echo reveals dilated LA, small left PV's and large right PV's and trivial pericardial effusion 4. No early apparent complications. 5. Colchicine 0.'6mg'$  PO BID x 5 days 6. Protonix '40mg'$  PO daily x 45 days   Laboratory Data:  High Sensitivity Troponin:   Recent Labs  Lab 10/30/21 1822 10/30/21 2016 10/31/21 0100  TROPONINIHS 458* 423* 403*     Chemistry Recent Labs  Lab 10/30/21 1822  NA 133*  K 4.2  CL 102  CO2 24  GLUCOSE 122*  BUN 29*  CREATININE 0.97  CALCIUM 9.0  GFRNONAA 59*  ANIONGAP 7    No results for input(s): "PROT", "ALBUMIN", "AST", "ALT", "ALKPHOS", "BILITOT" in the last 168 hours. Lipids  Recent Labs  Lab 10/31/21 0100  CHOL 128  TRIG 57  HDL 61  LDLCALC 56  CHOLHDL 2.1     Hematology Recent Labs  Lab 10/30/21 1822 10/31/21 0100  WBC 11.1*  --   RBC 3.05* 3.22*  HGB 9.7*  --   HCT 29.5*  --   MCV 96.7  --   MCH 31.8  --   MCHC 32.9  --   RDW 13.8  --   PLT 219  --    Thyroid  Recent Labs  Lab 10/31/21 0405  TSH 1.718    BNP Recent Labs  Lab 10/30/21 1822  BNP 435.0*    DDimer  Recent Labs  Lab 10/30/21 1822  DDIMER 0.93*     Radiology/Studies:   CT Angio Chest PE W and/or Wo Contrast Result Date: 10/30/2021 CLINICAL DATA:  Pulmonary embolism suspected, positive D-dimer. Recent atrial fibrillation ablation. Chest pain and shortness of breath. Retroperitoneal bleed suspected. EXAM: CT ANGIOGRAPHY CHEST CT ABDOMEN AND PELVIS WITH CONTRAST TECHNIQUE: Multidetector CT imaging of the chest was performed using the standard protocol during bolus administration of intravenous contrast. Multiplanar CT image reconstructions and MIPs were obtained to evaluate the vascular anatomy. Multidetector CT imaging of the abdomen and pelvis was performed using the standard protocol during bolus administration of intravenous contrast. RADIATION DOSE REDUCTION: This exam was performed according to the departmental dose-optimization program which includes automated exposure control, adjustment of the mA and/or kV according to patient size and/or use of iterative reconstruction technique. CONTRAST:  167m OMNIPAQUE IOHEXOL 300 MG/ML  SOLN COMPARISON:  10/21/2021, 10/22/2020. FINDINGS: CTA CHEST FINDINGS Cardiovascular: The heart is borderline enlarged and there is a trace pericardial effusion. Scattered coronary artery calcifications are noted. There is atherosclerotic calcification of the aorta without evidence of aneurysm. The pulmonary trunk is normal in caliber. No pulmonary artery filling defect is identified. Mediastinum/Nodes: Shotty lymph nodes are present in the mediastinum. No hilar or axillary lymphadenopathy. The thyroid gland, trachea, and esophagus are within  normal limits. Lungs/Pleura: Emphysematous changes are present in the lungs. Subpleural reticulation and interlobular septal thickening are noted. There are small bilateral pleural effusions with atelectasis. No pneumothorax. Musculoskeletal: A left breast implant is noted. Mastectomy changes with breast reconstruction are noted on the right. Degenerative changes are present in the thoracic spine. No acute osseous abnormality. Review of the MIP images confirms the above findings. CT ABDOMEN and PELVIS FINDINGS Hepatobiliary: Stable scattered cysts are present in the liver. No biliary ductal dilatation. The gallbladder is surgically absent. Pancreas: Unremarkable. No pancreatic ductal dilatation or surrounding inflammatory changes. Spleen: Normal in size without focal abnormality. Adrenals/Urinary Tract: No adrenal nodule or mass. The kidneys enhance symmetrically. No renal calculus or hydronephrosis. Subcentimeter hypodensities are noted in the kidneys bilaterally which are too small to further characterize. The visualized portion of the urinary bladder is within normal limits. Examination is limited due to hardware artifact. Stomach/Bowel: Stomach is within normal limits. Appendix appears normal. No evidence of bowel wall thickening, distention, or inflammatory changes.  No free air or pneumatosis. Scattered diverticula are present along the colon without evidence of diverticulitis. Vascular/Lymphatic: Aortic atherosclerosis. No enlarged abdominal or pelvic lymph nodes. Reproductive: Status post hysterectomy. No adnexal masses. Other: A trace amount of free fluid is noted in the pelvis on the right. Musculoskeletal: Degenerative changes are present in the lumbar spine. Bilateral hip arthroplasty changes are noted. Lumbar spinal fusion hardware and laminectomy changes are present in the lower lumbar spine. Review of the MIP images confirms the above findings. IMPRESSION: 1. No evidence of pulmonary embolism. 2. Small  bilateral pleural effusions with atelectasis. 3. Emphysema with fibrotic changes. 4. No acute process in the abdomen and pelvis. 5. Aortic atherosclerosis and coronary artery calcifications. Electronically Signed   By: Brett Fairy M.D.   On: 10/30/2021 20:24    DG Chest Port 1 View Result Date: 10/30/2021 CLINICAL DATA:  Chest pain, shortness of breath EXAM: PORTABLE CHEST 1 VIEW COMPARISON:  03/27/2021 FINDINGS: Chronic interstitial changes. Increased density at the periphery of the lower right lung. Stable cardiomediastinal contours. IMPRESSION: Increased patchy density at the periphery of the lower right lung may reflect atelectasis, scarring, or consolidation. Electronically Signed   By: Macy Mis M.D.   On: 10/30/2021 18:12      Assessment and Plan:   SOB Pleural effusions, crackles Will give a single dose of IV lasix May need small dose going home  2. CP Not an ongoing c/o HS Trops are 2/2 recent ablation and are flat No ischemic EKG changes Will add sucralfate  3. Persistent AFib CHA2DS2Vasc is 5, on Xarelto, continue without interruption. Arrived in SR, now back in Afib, rates look ok Not unusual to bump in/out after ablation Follow    Risk Assessment/Risk Scores:    For questions or updates, please contact [place] Please consult [email or web address] for contact info under    Signed, Baldwin Jamaica, PA-C  10/31/2021 12:11 PM

## 2021-10-31 NOTE — Assessment & Plan Note (Signed)
-   Hypertensive emergency with blood pressures as high as 200s over 90s -Norvasc and hydralazine given in the ED -Continue as needed hydralazine -Continue scheduled Norvasc -Endorgan damage evidenced by troponin in the 400s -Troponin trending down since blood pressure has been closer to normal -Continue to monitor

## 2021-10-31 NOTE — Assessment & Plan Note (Signed)
Continue PPI ?

## 2021-10-31 NOTE — Progress Notes (Signed)
Pt converted from SR to AFIB. Zierle-Ghosh, MD notified and 49mMetoprolol IV ordered and given. Pt asymptomatic, VSS, and resting comfortably in bed at lowest position with call bell in reach.

## 2021-10-31 NOTE — Progress Notes (Signed)
Pt arrived via EMS from outside hospital. VSS with no complaints, and resting comfortably in bed at lowest position with call bell in reach. On-call hospitalist notified.

## 2021-10-31 NOTE — Assessment & Plan Note (Signed)
Continue Crestor 

## 2021-10-31 NOTE — Consult Note (Addendum)
Cardiology Consultation:   Patient ID: [name] MRN: [id number]; DOB: 1941/02/14  Admit date: 10/30/2021 Date of Consult: 10/31/2021  Primary Care Provider: Pablo Lawrence, NP Primary Cardiologist: Rozann Lesches, MD  Primary Electrophysiologist:  Vickie Epley, MD    Patient Profile:   [name] is a 81 y.o. female with a hx of CAD, HTN, asthma, persistent afib s/p multiple cardioversions and ablation on 7/3 who presents with shortness of breath and intermittent chest pain.  History of Present Illness:   [name] is a 81 y.o. female with a hx of CAD, HTN, asthma, persistent afib s/p multiple cardioversions and ablation on 7/3 who presents with shortness of breath.  Patient recently underwent ablation on 7/3 by Dr. Quentin Ore. After the ablation, she reported feeling better, but then said she started to feel more SOB on 7/5 which prompted her to go to [facility]. She endorsed some intermittent chest pain. She denied orthopnea, LE edema, or PND. At [facility] her labs were significant for troponin of 458. Otherwise CMP and CBC were wnl. EKG showed normal sinus rhythm. Given her chest pain, elevated troponins and recent procedure, she was transferred to [facility].  On my examination, patient reports her chest pain has resolved. However, she describes a burning feeling earlier today that she "thought was a blockage". Pain is non-radiating, and comes and goes, unrelated to exertion. Otherwise, she reports chronic leg pain, and no other issues. Her home medications are amiodarone, metropolol, levothyroxine, hydralazine, Xarelto, and rosuvastatin. After her ablation, she was also discharged with colchicine for pericarditis. In the hospital, she has been afebrile and hemodynamically stable, however she converted back to atrial fibrillation sometime after midnight (HR 100s). For afib, she was given IV metop 5 x1.  Past Medical History:  Diagnosis Date   Anemia     Arthritis    Breast cancer ([place])    Right mastectomy   Chronic back pain    Coronary atherosclerosis of native coronary artery    a. 12/2019 Cath: LM nl, LAD 30m LCX 50p, 80d, RCA 20p, 443m>med rx.   Demand ischemia ([facility]   a. 02/2021 HsTrop to 361 in setting of rapid afib; b. 02/2021 Echo: EF 65-70%, no rwma, mild LVH, RVSP 28.96m9m. Mildly to mod dil LA. Triv MR. Ao sclerosis w/o stenosis.   Environmental allergies    Essential hypertension    GERD (gastroesophageal reflux disease)    History of bronchitis    History of colon polyps    History of diverticulosis    History of migraine    Panic attacks    [place]   a. CHA2DS2VASc = 5-->xarelto.   Prolapse of vaginal vault after hysterectomy    Seasonal allergies    Urinary frequency     Past Surgical History:  Procedure Laterality Date   ABDOMINAL HYSTERECTOMY     ATRIAL FIBRILLATION ABLATION N/A 10/28/2021   Procedure: ATRIAL FIBRILLATION ABLATION;  Surgeon: LamVickie EpleyD;  Location: [facility];  Service: Cardiovascular;  Laterality: N/A;   BACK SURGERY  1981/2011/2014   BLADDER SUSPENSION     BREAST RECONSTRUCTION  2004   with abd tissue   BREAST SURGERY Right    CARDIOVERSION N/A 04/30/2021   Procedure: CARDIOVERSION;  Surgeon: BraArnoldo LenisD;  Location: [facility];  Service: Endoscopy;  Laterality: N/A;   CARDIOVERSION N/A 05/16/2021   Procedure: CARDIOVERSION;  Surgeon: BraArnoldo LenisD;  Location: [facility];  Service: Endoscopy;  Laterality: N/A;   CARDIOVERSION N/A 08/13/2021   Procedure: CARDIOVERSION;  Surgeon: Donato Heinz, MD;  Location: [facility] ENDOSCOPY;  Service: Cardiovascular;  Laterality: N/A;   CHOLECYSTECTOMY     COLONOSCOPY  12/2009   RMR: few pancolonic diverticula. next TCS 12/2014   COLONOSCOPY  06/14/2007   YNW:[id number] rectal polyp, status post cold biopsy removal/ Anal canal hemorrhoids/Left-sided diverticula Colonic mucosa appeared  normal.Hyperplastic polyp.    COLONOSCOPY  01/18/2004   VHQ:IONGEXB diverticula/Internal hemorrhoids.  Otherwise normal rectum   COLONOSCOPY N/A 04/18/2013   Dr. Gala Romney- normal rectum, scattered left sided diverticula the remainder of the colonic mucosa appeared normal   COLONOSCOPY N/A 08/18/2014   Procedure: COLONOSCOPY;  Surgeon: Daneil Dolin, MD;  [facility]: [facility];  Service: Endoscopy;  Laterality: N/A;  1000   ESOPHAGOGASTRODUODENOSCOPY  06/14/2007   MWU:[id number]-NUUV plaques in esophageal mucosa of uncertain significance brushed and biopsied/Normal stomach, normal first duodenum and second duodenoscopy. KOH negative. esophageal bx c/w GERD   ESOPHAGOGASTRODUODENOSCOPY N/A 08/18/2014   Procedure: ESOPHAGOGASTRODUODENOSCOPY (EGD);  Surgeon: Daneil Dolin, MD;  [facility];  Service: Endoscopy;  Laterality: N/A;   ESOPHAGOGASTRODUODENOSCOPY (EGD) WITH ESOPHAGEAL DILATION N/A 04/18/2013   Dr. Gala Romney- normal, patent appearing, tubular esophagus, normal gastric mucosa, paptent pylorus, normal first and second portion of the duodenum   EYE SURGERY     lasik   FOOT SURGERY Right 2014   d/t plantar fascitis   GIVENS CAPSULE STUDY N/A 07/18/2013   L.Lewis PAC- markedly abnormal appearing gastric mucosa, sugnificant bile reflux, questionable subucosal small bowel mass versus extrinsic compression in the distal small bowel.- CTE= no small bowel mass or tumor seen.   LEFT HEART CATH AND CORONARY ANGIOGRAPHY N/A 01/16/2020   Procedure: LEFT HEART CATH AND CORONARY ANGIOGRAPHY;  Surgeon: Burnell Blanks, MD;  Location: [facility];  Service: Cardiovascular;  Laterality: N/A;   MALONEY DILATION N/A 08/18/2014   Procedure: Venia Minks DILATION;  Surgeon: Daneil Dolin, MD;  [facility];  Service: Endoscopy;  Laterality: N/A;   MASTECTOMY     right side 12-13 years ago   MOUTH SURGERY     cyst removed in June 2022.   TOTAL HIP ARTHROPLASTY Left 02/29/2016    Procedure: TOTAL HIP ARTHROPLASTY ANTERIOR APPROACH;  Surgeon: Frederik Pear, MD;  Location: [place];  Service: Orthopedics;  Laterality: Left;   TOTAL HIP ARTHROPLASTY Right 01/05/2017   Procedure: TOTAL HIP ARTHROPLASTY ANTERIOR APPROACH;  Surgeon: Frederik Pear, MD;  Location: [place];  Service: Orthopedics;  Laterality: Right;  REQUEST 90 MINS     Home Medications:  Prior to Admission medications   Medication Sig Start Date End Date Taking? Authorizing Provider  albuterol (VENTOLIN HFA) 108 (90 Base) MCG/ACT inhaler Inhale 1-2 puffs into the lungs every 6 (six) hours as needed for shortness of breath or wheezing.   Yes Satira Sark, MD  amiodarone (PACERONE) 200 MG tablet Take 2 tablets (400 mg total) by mouth 2 (two) times daily for 5 days, THEN 2 tablets (400 mg total) daily for 5 days, THEN 1 tablet (200 mg total) daily. Patient taking differently: 1 tablet by mouth (200 mg total) daily. 07/24/21 07/29/22 Yes Vickie Epley, MD  Camphor-Menthol-Methyl Sal (SALONPAS) 3.05-03-08 % PTCH Apply 1 patch topically at bedtime as needed (pain).   Yes [provider]  colchicine 0.6 MG tablet Take 1 tablet (0.6 mg total) by mouth 2 (two) times daily for 5 days. 10/28/21 11/02/21  Yes Baldwin Jamaica, PA-C  diphenhydrAMINE (BENADRYL) 25 MG tablet Take 25 mg by mouth daily as needed for allergies.   Yes [provider]  docusate sodium (COLACE) 100 MG capsule Take 100 mg by mouth daily as needed for mild constipation.   Yes [provider]  HYDROcodone-acetaminophen (NORCO/VICODIN) 5-325 MG tablet Take 1 tablet by mouth every 8 (eight) hours as needed for moderate pain (back pain.). 03/16/21  Yes [provider]  levothyroxine (SYNTHROID) 25 MCG tablet Take 25 mcg by mouth daily before breakfast. 10/11/21  Yes [provider]  metoprolol succinate (TOPROL XL) 100 MG 24 hr tablet Take 0.5 daily with or immediately following a meal. Patient taking differently: 50 mg  daily.  immediately following a meal. 08/20/21  Yes Fenton, Clint R, PA  nitroGLYCERIN (NITROSTAT) 0.4 MG SL tablet Place 0.4 mg under the tongue every 5 (five) minutes as needed for chest pain.   Yes [provider]  pantoprazole (PROTONIX) 40 MG tablet Take 1 tablet (40 mg total) by mouth daily. 10/28/21 12/12/21 Yes Baldwin Jamaica, PA-C  rivaroxaban (XARELTO) 20 MG TABS tablet Take 1 tablet (20 mg total) by mouth daily with supper. 03/29/21  Yes Barton Dubois, MD  rosuvastatin (CRESTOR) 5 MG tablet TAKE ONE TABLET BY MOUTH DAILY 02/04/21  Yes Satira Sark, MD  omeprazole (PRILOSEC) 20 MG capsule Take 20 mg by mouth daily as needed (indigestion/heartburn). Patient not taking: Reported on 10/30/2021 07/25/20   [provider]    Inpatient Medications: Scheduled Meds:  amiodarone  200 mg Oral Daily   levothyroxine  25 mcg Oral Q0600   metoprolol succinate  50 mg Oral Daily   pantoprazole  40 mg Oral Daily   rivaroxaban  20 mg Oral Q supper   rosuvastatin  5 mg Oral Daily   Continuous Infusions:  PRN Meds: acetaminophen, diphenhydrAMINE, hydrALAZINE, ondansetron (ZOFRAN) IV  Allergies:    Allergies  Allergen Reactions   Penicillins Anaphylaxis, Swelling and Other (See Comments)    Blisters Has patient had a PCN reaction causing immediate rash, facial/tongue/throat swelling, SOB or lightheadedness with hypotension: Yes Has patient had a PCN reaction causing severe rash involving mucus membranes or skin necrosis: No Has patient had a PCN reaction that required hospitalization No Has patient had a PCN reaction occurring within the last 10 years: No If all of the above answers are "NO", then may proceed with Cephalosporin use.    Eliquis [Apixaban] Hives    Social History:   Social History   Socioeconomic History   Marital status: Widowed    Spouse name: [name]   Number of children: 4   Years of education: 12th   Highest education level: Not on file   Occupational History    Employer: RETIRED  Tobacco Use   Smoking status: Former    Packs/day: 1.50    Years: 30.00    Total pack years: 45.00    Types: Cigarettes    Quit date: 04/28/2000    Years since quitting: 21.5   Smokeless tobacco: Never   Tobacco comments:    Former smoker 07/30/21  Vaping Use   Vaping Use: Never used  Substance and Sexual Activity   Alcohol use: No   Drug use: No   Sexual activity: Not on file    Comment: hyst  Other Topics Concern   Not on file  Social History Narrative   Patient lives at home with spouse.   Caffeine Use: 2 cups of coffee  daily   Social Determinants of Health   Financial Resource Strain: Not on file  Food Insecurity: Not on file  Transportation Needs: Not on file  Physical Activity: Not on file  Stress: Not on file  Social Connections: Not on file  Intimate Partner Violence: Not on file    Family History:   No history of familial cardiomyopathy Family History  Problem Relation Age of Onset   Coronary artery disease Mother    Coronary artery disease Father    Cancer Sister        lung cancer, two sisters   Lung cancer Sister    Coronary artery disease Brother    Colon cancer Brother        less than age 57   Cancer Brother    Emphysema Brother    Diabetes Son    Healthy Son    Healthy Son    Healthy Son    Healthy Son      Review of Systems: [y] = yes, '[ ]'$  = no    General: Weight gain '[ ]'$ ; Weight loss '[ ]'$ ; Anorexia '[ ]'$ ; Fatigue '[ ]'$ ; Fever '[ ]'$ ; Chills '[ ]'$ ; Weakness '[ ]'$   Cardiac: Chest pain/pressure '[ ]'$ ; Resting SOB [ X]; Exertional SOB Valu.Nieves ]; Orthopnea '[ ]'$ ; Pedal Edema '[ ]'$ ; Palpitations '[ ]'$ ; Syncope '[ ]'$ ; Presyncope '[ ]'$ ; Paroxysmal nocturnal dyspnea'[ ]'$   Pulmonary: Cough '[ ]'$ ; Wheezing'[ ]'$ ; Hemoptysis'[ ]'$ ; Sputum '[ ]'$ ; Snoring '[ ]'$   GI: Vomiting'[ ]'$ ; Dysphagia'[ ]'$ ; Melena'[ ]'$ ; Hematochezia '[ ]'$ ; Heartburn'[ ]'$ ; Abdominal pain '[ ]'$ ; Constipation '[ ]'$ ; Diarrhea '[ ]'$ ; BRBPR '[ ]'$   GU: Hematuria'[ ]'$ ; Dysuria '[ ]'$ ; Nocturia'[ ]'$   Vascular:  Pain in legs with walking '[ ]'$ ; Pain in feet with lying flat '[ ]'$ ; Non-healing sores '[ ]'$ ; Stroke '[ ]'$ ; TIA '[ ]'$ ; Slurred speech '[ ]'$ ;  Neuro: Headaches'[ ]'$ ; Vertigo'[ ]'$ ; Seizures'[ ]'$ ; Paresthesias'[ ]'$ ;Blurred vision '[ ]'$ ; Diplopia '[ ]'$ ; Vision changes '[ ]'$   Ortho/Skin: Arthritis '[ ]'$ ; Joint pain '[ ]'$ ; Muscle pain '[ ]'$ ; Joint swelling '[ ]'$ ; Back Pain '[ ]'$ ; Rash '[ ]'$   Psych: Depression'[ ]'$ ; Anxiety'[ ]'$   Heme: Bleeding problems '[ ]'$ ; Clotting disorders '[ ]'$ ; Anemia '[ ]'$   Endocrine: Diabetes '[ ]'$ ; Thyroid dysfunction'[ ]'$   Physical Exam/Data:   Vitals:   10/30/21 2130 10/30/21 2353 10/31/21 0003 10/31/21 0117  BP: (!) 187/62 (!) 167/66  (!) 150/83  Pulse: 61 62  (!) 117  Resp: 20 16    Temp:  97.7 F (36.5 C)    TempSrc:  Oral    SpO2: 98%     Weight:  72.2 kg 72.2 kg   Height:  '5\' 5"'$  (1.651 m)     No intake or output data in the 24 hours ending 10/31/21 0230 Filed Weights   10/30/21 1747 10/30/21 2353 10/31/21 0003  Weight: 69.9 kg 72.2 kg 72.2 kg   Body mass index is 26.49 kg/m.  General:  Well nourished, well developed, in mild distress HEENT: normal Lymph: no adenopathy Neck: no JVD Endocrine:  No thryomegaly Vascular: No carotid bruits; FA pulses 2+ bilaterally without bruits  Cardiac:  normal S1, S2; irregularly irregular rhythm Lungs:  clear to auscultation bilaterally, no wheezing, rhonchi or rales  Abd: soft, nontender, no hepatomegaly  Ext: no edema Musculoskeletal:  No deformities, BUE and BLE strength normal and equal Skin: warm and dry  Neuro:  CNs 2-12 intact, no focal abnormalities noted Psych:  Normal affect  Telemetry:  Telemetry was personally reviewed and demonstrates: atrial fibrillation  Relevant CV Studies: [facility] 2021 Cardiac catheterization 01/16/2020: Mid RCA lesion is 40% stenosed. Prox RCA lesion is 20% stenosed. Dist Cx lesion is 80% stenosed. Prox Cx lesion is 50% stenosed. Mid LAD lesion is 20% stenosed.   1. Moderate stenosis mid Circumflex. This lesion is not flow  limiting by functional assessment (DFR 0.96). The small caliber distal Circumflex has a severe stenosis but this vessel appears to be too small for PCI (1.75 mm vessel).  2. Mild plaque in the LAD 3. The RCA is a large dominant vessel with mild to moderate calcified plaque in the mid and distal vessel.   Ablation 7/3: CONCLUSIONS: 1. Successful PVI 2. Successful ablation/isolation of the posterior wall 3. Intracardiac echo reveals dilated LA, small left PV's and large right PV's and trivial pericardial effusion 4. No early apparent complications. 5. Colchicine 0.'6mg'$  PO BID x 5 days 6. Protonix '40mg'$  PO daily x 45 days  TTE 03/2021:  1. Left ventricular ejection fraction, by estimation, is 65 to 70%. The  left ventricle has normal function. The left ventricle has no regional  wall motion abnormalities. There is mild left ventricular hypertrophy.  Left ventricular diastolic parameters  are indeterminate.   2. Right ventricular systolic function is normal. The right ventricular  size is normal. There is normal pulmonary artery systolic pressure. The  estimated right ventricular systolic pressure is 12.2 mmHg.   3. Left atrial size was mild to moderately dilated.   4. There is a trivial pericardial effusion posterior to the left  ventricle.   5. The mitral valve is grossly normal. Trivial mitral valve  regurgitation.   6. The aortic valve is tricuspid. Aortic valve regurgitation is not  visualized. Aortic valve sclerosis is present, with no evidence of aortic  valve stenosis. Aortic valve mean gradient measures 3.5 mmHg.   7. The inferior vena cava is normal in size with greater than 50%  respiratory variability, suggesting right atrial pressure of 3 mmHg.   [place] 2021   Laboratory Data:  Chemistry Recent Labs  Lab 10/30/21 1822  NA 133*  K 4.2  CL 102  CO2 24  GLUCOSE 122*  BUN 29*  CREATININE 0.97  CALCIUM 9.0  GFRNONAA 59*  ANIONGAP 7    No results for input(s):  "PROT", "ALBUMIN", "AST", "ALT", "ALKPHOS", "BILITOT" in the last 168 hours. Hematology Recent Labs  Lab 10/30/21 1822 10/31/21 0100  WBC 11.1*  --   RBC 3.05* 3.22*  HGB 9.7*  --   HCT 29.5*  --   MCV 96.7  --   MCH 31.8  --   MCHC 32.9  --   RDW 13.8  --   PLT 219  --    Cardiac EnzymesNo results for input(s): "TROPONINI" in the last 168 hours. No results for input(s): "TROPIPOC" in the last 168 hours.  BNP Recent Labs  Lab 10/30/21 1822  BNP 435.0*    DDimer  Recent Labs  Lab 10/30/21 1822  DDIMER 0.93*    Radiology/Studies:  CT Angio Chest PE W and/or Wo Contrast  Result Date: 10/30/2021 CLINICAL DATA:  Pulmonary embolism suspected, positive D-dimer. Recent atrial fibrillation ablation. Chest pain and shortness of breath. Retroperitoneal bleed suspected. EXAM: CT ANGIOGRAPHY CHEST CT ABDOMEN AND PELVIS WITH CONTRAST TECHNIQUE: Multidetector CT imaging of the chest was performed using the standard protocol during bolus administration of intravenous contrast. Multiplanar CT image reconstructions and MIPs were obtained to evaluate  the vascular anatomy. Multidetector CT imaging of the abdomen and pelvis was performed using the standard protocol during bolus administration of intravenous contrast. RADIATION DOSE REDUCTION: This exam was performed according to the departmental dose-optimization program which includes automated exposure control, adjustment of the mA and/or kV according to patient size and/or use of iterative reconstruction technique. CONTRAST:  144m OMNIPAQUE IOHEXOL 300 MG/ML  SOLN COMPARISON:  10/21/2021, 10/22/2020. FINDINGS: CTA CHEST FINDINGS Cardiovascular: The heart is borderline enlarged and there is a trace pericardial effusion. Scattered coronary artery calcifications are noted. There is atherosclerotic calcification of the aorta without evidence of aneurysm. The pulmonary trunk is normal in caliber. No pulmonary artery filling defect is identified.  Mediastinum/Nodes: Shotty lymph nodes are present in the mediastinum. No hilar or axillary lymphadenopathy. The thyroid gland, trachea, and esophagus are within normal limits. Lungs/Pleura: Emphysematous changes are present in the lungs. Subpleural reticulation and interlobular septal thickening are noted. There are small bilateral pleural effusions with atelectasis. No pneumothorax. Musculoskeletal: A left breast implant is noted. Mastectomy changes with breast reconstruction are noted on the right. Degenerative changes are present in the thoracic spine. No acute osseous abnormality. Review of the MIP images confirms the above findings. CT ABDOMEN and PELVIS FINDINGS Hepatobiliary: Stable scattered cysts are present in the liver. No biliary ductal dilatation. The gallbladder is surgically absent. Pancreas: Unremarkable. No pancreatic ductal dilatation or surrounding inflammatory changes. Spleen: Normal in size without focal abnormality. Adrenals/Urinary Tract: No adrenal nodule or mass. The kidneys enhance symmetrically. No renal calculus or hydronephrosis. Subcentimeter hypodensities are noted in the kidneys bilaterally which are too small to further characterize. The visualized portion of the urinary bladder is within normal limits. Examination is limited due to hardware artifact. Stomach/Bowel: Stomach is within normal limits. Appendix appears normal. No evidence of bowel wall thickening, distention, or inflammatory changes. No free air or pneumatosis. Scattered diverticula are present along the colon without evidence of diverticulitis. Vascular/Lymphatic: Aortic atherosclerosis. No enlarged abdominal or pelvic lymph nodes. Reproductive: Status post hysterectomy. No adnexal masses. Other: A trace amount of free fluid is noted in the pelvis on the right. Musculoskeletal: Degenerative changes are present in the lumbar spine. Bilateral hip arthroplasty changes are noted. Lumbar spinal fusion hardware and  laminectomy changes are present in the lower lumbar spine. Review of the MIP images confirms the above findings. IMPRESSION: 1. No evidence of pulmonary embolism. 2. Small bilateral pleural effusions with atelectasis. 3. Emphysema with fibrotic changes. 4. No acute process in the abdomen and pelvis. 5. Aortic atherosclerosis and coronary artery calcifications. Electronically Signed   By: LBrett FairyM.D.   On: 10/30/2021 20:24   CT ABDOMEN PELVIS W CONTRAST  Result Date: 10/30/2021 CLINICAL DATA:  Pulmonary embolism suspected, positive D-dimer. Recent atrial fibrillation ablation. Chest pain and shortness of breath. Retroperitoneal bleed suspected. EXAM: CT ANGIOGRAPHY CHEST CT ABDOMEN AND PELVIS WITH CONTRAST TECHNIQUE: Multidetector CT imaging of the chest was performed using the standard protocol during bolus administration of intravenous contrast. Multiplanar CT image reconstructions and MIPs were obtained to evaluate the vascular anatomy. Multidetector CT imaging of the abdomen and pelvis was performed using the standard protocol during bolus administration of intravenous contrast. RADIATION DOSE REDUCTION: This exam was performed according to the departmental dose-optimization program which includes automated exposure control, adjustment of the mA and/or kV according to patient size and/or use of iterative reconstruction technique. CONTRAST:  1032mOMNIPAQUE IOHEXOL 300 MG/ML  SOLN COMPARISON:  10/21/2021, 10/22/2020. FINDINGS: CTA CHEST FINDINGS Cardiovascular: The heart is  borderline enlarged and there is a trace pericardial effusion. Scattered coronary artery calcifications are noted. There is atherosclerotic calcification of the aorta without evidence of aneurysm. The pulmonary trunk is normal in caliber. No pulmonary artery filling defect is identified. Mediastinum/Nodes: Shotty lymph nodes are present in the mediastinum. No hilar or axillary lymphadenopathy. The thyroid gland, trachea, and esophagus  are within normal limits. Lungs/Pleura: Emphysematous changes are present in the lungs. Subpleural reticulation and interlobular septal thickening are noted. There are small bilateral pleural effusions with atelectasis. No pneumothorax. Musculoskeletal: A left breast implant is noted. Mastectomy changes with breast reconstruction are noted on the right. Degenerative changes are present in the thoracic spine. No acute osseous abnormality. Review of the MIP images confirms the above findings. CT ABDOMEN and PELVIS FINDINGS Hepatobiliary: Stable scattered cysts are present in the liver. No biliary ductal dilatation. The gallbladder is surgically absent. Pancreas: Unremarkable. No pancreatic ductal dilatation or surrounding inflammatory changes. Spleen: Normal in size without focal abnormality. Adrenals/Urinary Tract: No adrenal nodule or mass. The kidneys enhance symmetrically. No renal calculus or hydronephrosis. Subcentimeter hypodensities are noted in the kidneys bilaterally which are too small to further characterize. The visualized portion of the urinary bladder is within normal limits. Examination is limited due to hardware artifact. Stomach/Bowel: Stomach is within normal limits. Appendix appears normal. No evidence of bowel wall thickening, distention, or inflammatory changes. No free air or pneumatosis. Scattered diverticula are present along the colon without evidence of diverticulitis. Vascular/Lymphatic: Aortic atherosclerosis. No enlarged abdominal or pelvic lymph nodes. Reproductive: Status post hysterectomy. No adnexal masses. Other: A trace amount of free fluid is noted in the pelvis on the right. Musculoskeletal: Degenerative changes are present in the lumbar spine. Bilateral hip arthroplasty changes are noted. Lumbar spinal fusion hardware and laminectomy changes are present in the lower lumbar spine. Review of the MIP images confirms the above findings. IMPRESSION: 1. No evidence of pulmonary  embolism. 2. Small bilateral pleural effusions with atelectasis. 3. Emphysema with fibrotic changes. 4. No acute process in the abdomen and pelvis. 5. Aortic atherosclerosis and coronary artery calcifications. Electronically Signed   By: Brett Fairy M.D.   On: 10/30/2021 20:24   DG Chest Port 1 View  Result Date: 10/30/2021 CLINICAL DATA:  Chest pain, shortness of breath EXAM: PORTABLE CHEST 1 VIEW COMPARISON:  03/27/2021 FINDINGS: Chronic interstitial changes. Increased density at the periphery of the lower right lung. Stable cardiomediastinal contours. IMPRESSION: Increased patchy density at the periphery of the lower right lung may reflect atelectasis, scarring, or consolidation. Electronically Signed   By: Macy Mis M.D.   On: 10/30/2021 18:12   EP STUDY  Result Date: 10/28/2021 CONCLUSIONS: 1. Successful PVI 2. Successful ablation/isolation of the posterior wall 3. Intracardiac echo reveals dilated LA, small left PV's and large right PV's and trivial pericardial effusion 4. No early apparent complications. 5. Colchicine 0.'6mg'$  PO BID x 5 days 6. Protonix '40mg'$  PO daily x 45 days    Assessment and Plan:  [name] is a 81 y.o. female with a hx of CAD, HTN, asthma, persistent afib s/p multiple cardioversions and ablation on 7/3 who presents with shortness of breath and intermittent chest pain. Cardiology is consulted for elevated troponin  #. Shortness of breath #. Intermittent Chest pain #. Elevated troponin #. Coronary artery disease Patient currently reports her chest pain has resolved, but she still endorses some SOB. Her EKG was NSR at [facility] and did not show ST/T changes that would be  concerning for ischemia. Her elevated troponin to 400 is likely in the setting of demand ischemia from her known CAD and hypertensive emergency (SBP >200 at [facility]). Her last LHC in 2021 showed moderate stenosis in the mid circumflex that was not flow limited and severe stenosis in the  distal Lcx that was not amenable to PCI. There was mild plaque in the LAD and the RCA had mild to moderate calcified plaque. Ultimately, medical management was recommended. Given her resolution of chest pain and stable EKG, it is unlikely that she would benefit from a repeat catheterization at this time as concern for acute coronary syndrome is low. However if her troponins were to trend up drastically or if she were to develop severe chest pain, repeat ischemic eval could be considered. Otherwise, it is reasonable to obtain TTE to rule out post-procedural pericardial effusion. I spoke with her EP Dr. Quentin Ore over the phone who suggested treated for pericarditis with ibuprofen and colchicine. - Recommend colchicine 0.6 mg PO BID x 5 days - Agree with hypertension control per primary team - Ibuprofen prn for pain - No indication for IV heparin or asa load - AM ECHO to rule out pericardial effusion - Continue home crestor 5 mg. Would consider titrating up to 20 mg or 40 mg for high-intensity statin dosage given her known CAD  #. Atrial fibrillation, persistent. S/p ablation 7/3 - Continue home amiodarone - Continue home metoprolol - Continue home Xarelto - Patient now back in Afib. Discuss with EP if there are further anti-arrhythmic therapy options    For questions or updates, please contact [place] Please consult [email or web address] for contact info under     Signed, Loel Dubonnet, MD  10/31/2021 2:30 AM

## 2021-10-31 NOTE — Assessment & Plan Note (Signed)
-   Cardiology recommended admission to [facility] so the patient can be seen during night shift -Determined elevation in troponin to be demand ischemia -2021 cardiac cath reviewed with mild to moderate blockages, cardiology recommending against repeat catheterization unless patient develops severe chest pain -EP recommended treatment for pericarditis with ibuprofen and colchicine -Colchicine 0.6 mg p.o. twice daily x5 days, ibuprofen as needed for pain -A.m. echo to rule out pericardial effusion -Continue to monitor

## 2021-10-31 NOTE — Assessment & Plan Note (Signed)
-   Inpatient consult to cardiology, appreciate recommendations which are as follows: Continue amiodarone, continue metoprolol, continue Xarelto.  Patient now back in A-fib after ablation 3 days ago.  Cardiology plans discussed with the EP for further options

## 2021-10-31 NOTE — Assessment & Plan Note (Signed)
-   hemoglobin dropped from 11.9 >> 9.7 in less than 1 month -No melena, hematochezia, hematuria -Does note bruising since recent ablation -FOBT with next bowel movement -Anemia panel -For now we will continue with Xarelto -Trend in the a.m.

## 2021-10-31 NOTE — H&P (Signed)
History and Physical    Patient: [name] WUJ:[id number] DOB: Sep 13, 1940 DOA: 10/30/2021 DOS: the patient was seen and examined on 10/31/2021 PCP: Pablo Lawrence, NP  Patient coming from: Home  Chief Complaint:  Chief Complaint  Patient presents with   Chest Pain   HPI: [name] is a 81 y.o. female with medical history significant of anemia, breast cancer, coronary artery disease with last cardiac catheterization 2021, essential hypertension, GERD, anxiety with panic attacks, persistent atrial fibrillation, and more presents ED with a chief complaint of left-sided chest pain.  Patient reports that the pain started in the morning of presentation.  Its been associated with dyspnea.  Started while she was sitting down, as her dyspnea had been so bad that she could not get up and walk around.  She reports her dyspnea has been worse since her recent ablation on July 3.  Patient reports that she cannot lay flat because of her dyspnea, and her dyspnea is also worse with exertion.  She has not had any palpitations since July 3, but she was having those prior to her ablation.  She also had multiple cardioversions in the past.  Patient denies any fevers or cough.  She describes her chest pain as a squeezing pain that starts over her left breast, radiates up to her left shoulder, and then wraps around downwards around her back to her right waist.  Pain is constant.  She has never had pain like this before.  Patient reports that she has been fatigued and drained.  She has generalized weakness, no nausea or vomiting.  She has had abdominal cramping and diarrhea.  Patient reports that she has not been eating well because her husband died 2 years ago.  She reports poor p.o. intake, and also poor diet.  She does report drinking plenty of water.  Patient has no other complaints at this time.  Patient does not wear oxygen at home.  She was put on oxygen in the ER for comfort.  She did not have any hypoxic  O2 sats.  Patient does not smoke, does not drink alcohol, does not use illicit drugs.  She is vaccinated for COVID.  Patient is full code. Review of Systems: As mentioned in the history of present illness. All other systems reviewed and are negative. Past Medical History:  Diagnosis Date   Anemia    Arthritis    Breast cancer ([place])    Right mastectomy   Chronic back pain    Coronary atherosclerosis of native coronary artery    a. 12/2019 Cath: LM nl, LAD 567m LCX 50p, 80d, RCA 20p, 442m>med rx.   Demand ischemia ([place]   a. 02/2021 HsTrop to 361 in setting of rapid afib; b. 02/2021 Echo: EF 65-70%, no rwma, mild LVH, RVSP 28.67m267m. Mildly to mod dil LA. Triv MR. Ao sclerosis w/o stenosis.   Environmental allergies    Essential hypertension    GERD (gastroesophageal reflux disease)    History of bronchitis    History of colon polyps    History of diverticulosis    History of migraine    Panic attacks    Persistent [place] ([place]   a. CHA2DS2VASc = 5-->xarelto.   Prolapse of vaginal vault after hysterectomy    Seasonal allergies    Urinary frequency    Past Surgical History:  Procedure Laterality Date   ABDOMINAL HYSTERECTOMY     ATRIAL FIBRILLATION ABLATION N/A 10/28/2021   Procedure: ATRIAL FIBRILLATION ABLATION;  Surgeon: Vickie Epley, MD;  Location: [place] [facility];  Service: Cardiovascular;  Laterality: N/A;   BACK SURGERY  1981/2011/2014   BLADDER SUSPENSION     BREAST RECONSTRUCTION  2004   with abd tissue   BREAST SURGERY Right    CARDIOVERSION N/A 04/30/2021   Procedure: CARDIOVERSION;  Surgeon: Arnoldo Lenis, MD;  Location: [facility];  Service: Endoscopy;  Laterality: N/A;   CARDIOVERSION N/A 05/16/2021   Procedure: CARDIOVERSION;  Surgeon: Arnoldo Lenis, MD;  Location: [facility];  Service: Endoscopy;  Laterality: N/A;   CARDIOVERSION N/A 08/13/2021   Procedure: CARDIOVERSION;  Surgeon: Donato Heinz, MD;  Location: [facility]  ENDOSCOPY;  Service: Cardiovascular;  Laterality: N/A;   CHOLECYSTECTOMY     COLONOSCOPY  12/2009   RMR: few pancolonic diverticula. next TCS 12/2014   COLONOSCOPY  06/14/2007   KKX:[id number] rectal polyp, status post cold biopsy removal/ Anal canal hemorrhoids/Left-sided diverticula Colonic mucosa appeared normal.Hyperplastic polyp.    COLONOSCOPY  01/18/2004   IRC:VELFYBO diverticula/Internal hemorrhoids.  Otherwise normal rectum   COLONOSCOPY N/A 04/18/2013   Dr. Gala Romney- normal rectum, scattered left sided diverticula the remainder of the colonic mucosa appeared normal   COLONOSCOPY N/A 08/18/2014   Procedure: COLONOSCOPY;  Surgeon: Daneil Dolin, MD;  [facility];  Service: Endoscopy;  Laterality: N/A;  1000   ESOPHAGOGASTRODUODENOSCOPY  06/14/2007   FBP:ZWCHEN-IDPO plaques in esophageal mucosa of uncertain significance brushed and biopsied/Normal stomach, normal first duodenum and second duodenoscopy. KOH negative. esophageal bx c/w GERD   ESOPHAGOGASTRODUODENOSCOPY N/A 08/18/2014   Procedure: ESOPHAGOGASTRODUODENOSCOPY (EGD);  Surgeon: Daneil Dolin, MD;  [facility];  Service: Endoscopy;  Laterality: N/A;   ESOPHAGOGASTRODUODENOSCOPY (EGD) WITH ESOPHAGEAL DILATION N/A 04/18/2013   Dr. Gala Romney- normal, patent appearing, tubular esophagus, normal gastric mucosa, paptent pylorus, normal first and second portion of the duodenum   EYE SURGERY     lasik   FOOT SURGERY Right 2014   d/t plantar fascitis   GIVENS CAPSULE STUDY N/A 07/18/2013   L.Lewis PAC- markedly abnormal appearing gastric mucosa, sugnificant bile reflux, questionable subucosal small bowel mass versus extrinsic compression in the distal small bowel.- CTE= no small bowel mass or tumor seen.   LEFT HEART CATH AND CORONARY ANGIOGRAPHY N/A 01/16/2020   Procedure: LEFT HEART CATH AND CORONARY ANGIOGRAPHY;  Surgeon: Burnell Blanks, MD;  Location: [place] [facility];  Service: Cardiovascular;   Laterality: N/A;   MALONEY DILATION N/A 08/18/2014   Procedure: Venia Minks DILATION;  Surgeon: Daneil Dolin, MD;  [facility];  Service: Endoscopy;  Laterality: N/A;   MASTECTOMY     right side 12-13 years ago   MOUTH SURGERY     cyst removed in June 2022.   TOTAL HIP ARTHROPLASTY Left 02/29/2016   Procedure: TOTAL HIP ARTHROPLASTY ANTERIOR APPROACH;  Surgeon: Frederik Pear, MD;  Location: [place];  Service: Orthopedics;  Laterality: Left;   TOTAL HIP ARTHROPLASTY Right 01/05/2017   Procedure: TOTAL HIP ARTHROPLASTY ANTERIOR APPROACH;  Surgeon: Frederik Pear, MD;  Location: [place];  Service: Orthopedics;  Laterality: Right;  REQUEST 90 MINS   Social History:  reports that she quit smoking about 21 years ago. Her smoking use included cigarettes. She has a 45.00 pack-year smoking history. She has never used smokeless tobacco. She reports that she does not drink alcohol and does not use drugs.  Allergies  Allergen Reactions   Penicillins Anaphylaxis, Swelling and Other (See Comments)    Blisters Has patient  had a PCN reaction causing immediate rash, facial/tongue/throat swelling, SOB or lightheadedness with hypotension: Yes Has patient had a PCN reaction causing severe rash involving mucus membranes or skin necrosis: No Has patient had a PCN reaction that required hospitalization No Has patient had a PCN reaction occurring within the last 10 years: No If all of the above answers are "NO", then may proceed with Cephalosporin use.    Eliquis [Apixaban] Hives    Family History  Problem Relation Age of Onset   Coronary artery disease Mother    Coronary artery disease Father    Cancer Sister        lung cancer, two sisters   Lung cancer Sister    Coronary artery disease Brother    Colon cancer Brother        less than age 48   Cancer Brother    Emphysema Brother    Diabetes Son    Healthy Son    Healthy Son    Healthy Son    Healthy Son     Prior to Admission medications    Medication Sig Start Date End Date Taking? Authorizing Provider  albuterol (VENTOLIN HFA) 108 (90 Base) MCG/ACT inhaler Inhale 1-2 puffs into the lungs every 6 (six) hours as needed for shortness of breath or wheezing.   Yes Satira Sark, MD  amiodarone (PACERONE) 200 MG tablet Take 2 tablets (400 mg total) by mouth 2 (two) times daily for 5 days, THEN 2 tablets (400 mg total) daily for 5 days, THEN 1 tablet (200 mg total) daily. Patient taking differently: 1 tablet by mouth (200 mg total) daily. 07/24/21 07/29/22 Yes Vickie Epley, MD  Camphor-Menthol-Methyl Sal (SALONPAS) 3.05-03-08 % PTCH Apply 1 patch topically at bedtime as needed (pain).   Yes [provider]  colchicine 0.6 MG tablet Take 1 tablet (0.6 mg total) by mouth 2 (two) times daily for 5 days. 10/28/21 11/02/21 Yes Baldwin Jamaica, PA-C  diphenhydrAMINE (BENADRYL) 25 MG tablet Take 25 mg by mouth daily as needed for allergies.   Yes [provider]  docusate sodium (COLACE) 100 MG capsule Take 100 mg by mouth daily as needed for mild constipation.   Yes [provider]  HYDROcodone-acetaminophen (NORCO/VICODIN) 5-325 MG tablet Take 1 tablet by mouth every 8 (eight) hours as needed for moderate pain (back pain.). 03/16/21  Yes [provider]  levothyroxine (SYNTHROID) 25 MCG tablet Take 25 mcg by mouth daily before breakfast. 10/11/21  Yes [provider]  metoprolol succinate (TOPROL XL) 100 MG 24 hr tablet Take 0.5 daily with or immediately following a meal. Patient taking differently: 50 mg daily.  immediately following a meal. 08/20/21  Yes Fenton, Clint R, PA  nitroGLYCERIN (NITROSTAT) 0.4 MG SL tablet Place 0.4 mg under the tongue every 5 (five) minutes as needed for chest pain.   Yes [provider]  pantoprazole (PROTONIX) 40 MG tablet Take 1 tablet (40 mg total) by mouth daily. 10/28/21 12/12/21 Yes Baldwin Jamaica, PA-C  rivaroxaban (XARELTO) 20 MG TABS tablet Take 1  tablet (20 mg total) by mouth daily with supper. 03/29/21  Yes Barton Dubois, MD  rosuvastatin (CRESTOR) 5 MG tablet TAKE ONE TABLET BY MOUTH DAILY 02/04/21  Yes Satira Sark, MD  omeprazole (PRILOSEC) 20 MG capsule Take 20 mg by mouth daily as needed (indigestion/heartburn). Patient not taking: Reported on 10/30/2021 07/25/20   [provider]    Physical Exam: Vitals:   10/30/21 6045 10/31/21 0003  10/31/21 0117 10/31/21 0326  BP: (!) 167/66  (!) 150/83 138/69  Pulse: 62  (!) 117 79  Resp: 16   18  Temp: 97.7 F (36.5 C)   97.6 F (36.4 C)  TempSrc: Oral   Oral  SpO2:    99%  Weight: 72.2 kg 72.2 kg    Height: '5\' 5"'$  (1.651 m)      1.  General: Patient lying supine in bed,  no acute distress   2. Psychiatric: Alert and oriented x 3, mood is anxious and behavior is normal for situation, pleasant and cooperative with exam   3. Neurologic: Speech and language are normal, face is symmetric, moves all 4 extremities voluntarily, at baseline without acute deficits on limited exam   4. HEENMT:  Head is atraumatic, normocephalic, pupils reactive to light, neck is supple, trachea is midline, mucous membranes are moist   5. Respiratory : Lungs are clear to auscultation bilaterally without wheezing, rhonchi, rales, no cyanosis, no increase in work of breathing or accessory muscle use   6. Cardiovascular : Heart rate normal, rhythm is regular, no murmurs, rubs or gallops, no peripheral edema, peripheral pulses palpated   7. Gastrointestinal:  Abdomen is soft, nondistended, nontender to palpation bowel sounds active, no masses or organomegaly palpated   8. Skin:  Skin is warm, dry and intact without rashes, acute lesions, or ulcers on limited exam   9.Musculoskeletal:  No acute deformities or trauma, no asymmetry in tone, no peripheral edema, peripheral pulses palpated, no tenderness to palpation in the extremities  Data Reviewed: In the ED 98, heart rate 53-68,  respiratory rate 18-22, blood pressure 174/63-200/90, satting at 99% on 2 L nasal cannula Initial troponin 458, then 423, then 403 Mild leukocytosis at 11.1, hemoglobin 9.7, platelets 219 Chemistries unremarkable BNP 435 D-dimer 0.93 CTA chest abdomen pelvis shows no PE.  Small bilateral pleural effusions with atelectasis.  Emphysema with fibrotic changes.  No acute process in the abdomen or pelvis. Chest x-ray shows patchy density in the lower right lobe that could be atelectasis, scarring, or consolidation EKG shows sinus rhythm heart rate 64, QTc 454-patient in and out of A-fib throughout her entire stay in the ER Hydralazine, aspirin, amlodipine given in the ED Cardiology consulted and recommended admission to [facility] so she can be seen tonight Cardiology recommends against repeat catheterization.  They do recommend echo, colchicine, ibuprofen.  These things are ordered. Admission to [facility] cardiac telemetry Assessment and Plan: * NSTEMI (non-ST elevated myocardial infarction) [facility]) - Cardiology recommended admission to [facility] so the patient can be seen during night shift -Determined elevation in troponin to be demand ischemia -2021 cardiac cath reviewed with mild to moderate blockages, cardiology recommending against repeat catheterization unless patient develops severe chest pain -EP recommended treatment for pericarditis with ibuprofen and colchicine -Colchicine 0.6 mg p.o. twice daily x5 days, ibuprofen as needed for pain -A.m. echo to rule out pericardial effusion -Continue to monitor  Hypothyroidism - The setting of hypertensive emergency, check TSH -Continue Synthroid  Atrial fibrillation, chronic [facility]) - Inpatient consult to cardiology, appreciate recommendations which are as follows: Continue amiodarone, continue metoprolol, continue Xarelto.  Patient now back in A-fib after ablation 3 days ago.  Cardiology plans discussed with the EP for further  options  Hypertensive emergency - Hypertensive emergency with blood pressures as high as 200s over 90s -Norvasc and hydralazine given in the ED -Continue as needed hydralazine -Continue scheduled Norvasc -Endorgan damage evidenced by troponin in the  400s -Troponin trending down since blood pressure has been closer to normal -Continue to monitor  Hyperlipidemia - Continue Crestor  Anemia - hemoglobin dropped from 11.9 >> 9.7 in less than 1 month -No melena, hematochezia, hematuria -Does note bruising since recent ablation -FOBT with next bowel movement -Anemia panel -For now we will continue with Xarelto -Trend in the a.m.  GERD - Continue PPI      Advance Care Planning:   Code Status: Full Code   Consults: Cardiology  Family Communication: Son at bedside  Severity of Illness: The appropriate patient status for this patient is OBSERVATION. Observation status is judged to be reasonable and necessary in order to provide the required intensity of service to ensure the patient's safety. The patient's presenting symptoms, physical exam findings, and initial radiographic and laboratory data in the context of their medical condition is felt to place them at decreased risk for further clinical deterioration. Furthermore, it is anticipated that the patient will be medically stable for discharge from the hospital within 2 midnights of admission.   Author: Rolla Plate, DO 10/31/2021 4:07 AM  For on call review [email or web address].

## 2021-10-31 NOTE — Progress Notes (Addendum)
Same day note  Patient seen and examined at bedside.  Patient was admitted to the hospital for chest pain.  At the time of my evaluation, patient complains of mild shortness of breath but no chest pain at this time.  Physical examination reveals elderly female.  On nasal cannula oxygen, coarse breath sounds noted.  Laboratory data and imaging was reviewed  Assessment and Plan.  Chest Pain /NSTEMI (non-ST elevated myocardial infarction) [facility]) Patient with previous cardiac cath in 2021 with mild to moderate blockages. Cardiology on board and recommends treatment of pericarditis with ibuprofen and colchicine.  Plan for colchicine 0.6 mg twice a day for 5 days and ibuprofen. 2D echocardiogram with no significant pericardial effusion, LV ejection fraction of 60 to 65% with indeterminate diastolic parameters .  CTA of the chest with no evidence of pulmonary embolism, bilateral small pleural effusion.  Follow cardiology recommendations    Hypothyroidism Continue Synthroid, TSH of 1.7.   Atrial fibrillation, persistent (HCC) Continue amiodarone, continue metoprolol, continue Xarelto.  Patient had ablation 3 days back and back in atrial fibrillation.  Electrophysiology following. CHA2DS2Vasc is 5   Hypertensive emergency Continue Norvasc hydralazine.  Elevated troponin.   Hyperlipidemia - Continue Crestor   Anemia Latest hemoglobin of 9.7.  FOBT has been ordered.  Continue Xarelto.   GERD - Continue PPI   No Charge  Signed,  Delila Pereyra, MD [facility]

## 2021-10-31 NOTE — Plan of Care (Signed)
  Problem: Health Behavior/Discharge Planning: Goal: Ability to safely manage health-related needs after discharge will improve Outcome: Progressing   Problem: Education: Goal: Knowledge of General Education information will improve Description: Including pain rating scale, medication(s)/side effects and non-pharmacologic comfort measures Outcome: Progressing   Problem: Clinical Measurements: Goal: Respiratory complications will improve Outcome: Progressing

## 2021-10-31 NOTE — Progress Notes (Signed)
  Echocardiogram 2D Echocardiogram has been performed.  Ronny Flurry 10/31/2021, 8:54 AM

## 2021-10-31 NOTE — TOC Progression Note (Addendum)
Transition of Care [facility]) - Progression Note    Patient Details  Name: [name] MRN: [id number] Date of Birth: 08-13-1940  Transition of Care [facility]) CM/SW Contact  Zenon Mayo, RN Phone Number: 10/31/2021, 3:08 PM  Clinical Narrative:     from home alone, indep, NSTEMI, s/p ablation, ? Pericardial effusion, echo results are pending. She states her son will transport her home at dc.  TOC following.       Expected Discharge Plan and Services                                                 Social Determinants of Health (SDOH) Interventions    Readmission Risk Interventions     No data to display

## 2021-10-31 NOTE — Progress Notes (Signed)
Mobility Specialist: Progress Note   10/31/21 1659  Mobility  Activity Ambulated independently in hallway  Level of Assistance Contact guard assist, steadying assist  Assistive Device None  Distance Ambulated (ft) 100 ft  Activity Response Tolerated fair  $Mobility charge 1 Mobility   Pre-Mobility: 93 HR, 94% SpO2 During Mobility: 116 HR Post-Mobility: 102 HR, 87-95% SpO2  Pt received in bed and agreeable to mobility. C/o feeling SOB during ambulation, otherwise asymptomatic. Pt desat to 87% SpO2 after returning to the room, recovered in <1 minute to 95% SpO2 with pt stating she felt better. Pt back in bed with call bell at her side.   [facility] [place] Mobility Specialist Mobility Specialist [address]: [phone]

## 2021-10-31 NOTE — Progress Notes (Signed)
Patient wanted to make note of her recent tooth infection. She says she had an abscess in her mouth and had to get many teeth removed. She was concerned abut the infection affecting her heart.

## 2021-11-01 ENCOUNTER — Other Ambulatory Visit: Payer: Self-pay | Admitting: Physician Assistant

## 2021-11-01 DIAGNOSIS — I214 Non-ST elevation (NSTEMI) myocardial infarction: Secondary | ICD-10-CM | POA: Diagnosis not present

## 2021-11-01 LAB — CBC
HCT: 31.3 % — ABNORMAL LOW (ref 36.0–46.0)
Hemoglobin: 10.9 g/dL — ABNORMAL LOW (ref 12.0–15.0)
MCH: 32.1 pg (ref 26.0–34.0)
MCHC: 34.8 g/dL (ref 30.0–36.0)
MCV: 92.1 fL (ref 80.0–100.0)
Platelets: 248 10*3/uL (ref 150–400)
RBC: 3.4 MIL/uL — ABNORMAL LOW (ref 3.87–5.11)
RDW: 13.4 % (ref 11.5–15.5)
WBC: 8 10*3/uL (ref 4.0–10.5)
nRBC: 0 % (ref 0.0–0.2)

## 2021-11-01 LAB — BASIC METABOLIC PANEL
Anion gap: 10 (ref 5–15)
BUN: 22 mg/dL (ref 8–23)
CO2: 24 mmol/L (ref 22–32)
Calcium: 9.2 mg/dL (ref 8.9–10.3)
Chloride: 100 mmol/L (ref 98–111)
Creatinine, Ser: 1.07 mg/dL — ABNORMAL HIGH (ref 0.44–1.00)
GFR, Estimated: 52 mL/min — ABNORMAL LOW (ref >60)
Glucose, Bld: 111 mg/dL — ABNORMAL HIGH (ref 70–99)
Potassium: 3.1 mmol/L — ABNORMAL LOW (ref 3.5–5.1)
Sodium: 134 mmol/L — ABNORMAL LOW (ref 135–145)

## 2021-11-01 LAB — MAGNESIUM: Magnesium: 1.7 mg/dL (ref 1.7–2.4)

## 2021-11-01 MED ORDER — POTASSIUM CHLORIDE CRYS ER 20 MEQ PO TBCR
40.0000 meq | EXTENDED_RELEASE_TABLET | ORAL | Status: AC
  Administered 2021-11-01 (×2): 40 meq via ORAL
  Filled 2021-11-01: qty 2

## 2021-11-01 MED ORDER — AMIODARONE HCL 200 MG PO TABS: ORAL_TABLET | ORAL | Status: DC

## 2021-11-01 MED ORDER — POTASSIUM CHLORIDE CRYS ER 20 MEQ PO TBCR
40.0000 meq | EXTENDED_RELEASE_TABLET | ORAL | Status: DC
  Filled 2021-11-01: qty 2

## 2021-11-01 NOTE — TOC Transition Note (Addendum)
Transition of Care [facility]) - CM/SW Discharge Note   Patient Details  Name: [name] MRN: [id number] Date of Birth: [birth year]  Transition of Care [facility]) CM/SW Contact:  Zenon Mayo, RN Phone Number: 11/01/2021, 10:50 AM   Clinical Narrative:    Patient is for dc today, she has no needs. She has transport at dc.  Will be receiving pottassium at 11:38.          Patient Goals and CMS Choice        Discharge Placement                       Discharge Plan and Services                                     Social Determinants of Health (SDOH) Interventions     Readmission Risk Interventions     No data to display

## 2021-11-01 NOTE — Plan of Care (Signed)
  Problem: Education: Goal: Understanding of cardiac disease, CV risk reduction, and recovery process will improve Outcome: Adequate for Discharge Goal: Individualized Educational Video(s) Outcome: Adequate for Discharge   Problem: Activity: Goal: Ability to tolerate increased activity will improve Outcome: Adequate for Discharge   Problem: Cardiac: Goal: Ability to achieve and maintain adequate cardiovascular perfusion will improve Outcome: Adequate for Discharge   Problem: Health Behavior/Discharge Planning: Goal: Ability to safely manage health-related needs after discharge will improve Outcome: Adequate for Discharge   Problem: Education: Goal: Knowledge of General Education information will improve Description: Including pain rating scale, medication(s)/side effects and non-pharmacologic comfort measures Outcome: Adequate for Discharge   Problem: Health Behavior/Discharge Planning: Goal: Ability to manage health-related needs will improve Outcome: Adequate for Discharge   Problem: Clinical Measurements: Goal: Ability to maintain clinical measurements within normal limits will improve Outcome: Adequate for Discharge Goal: Will remain free from infection Outcome: Adequate for Discharge Goal: Diagnostic test results will improve Outcome: Adequate for Discharge Goal: Respiratory complications will improve Outcome: Adequate for Discharge Goal: Cardiovascular complication will be avoided Outcome: Adequate for Discharge   Problem: Activity: Goal: Risk for activity intolerance will decrease Outcome: Adequate for Discharge   Problem: Nutrition: Goal: Adequate nutrition will be maintained Outcome: Adequate for Discharge   Problem: Coping: Goal: Level of anxiety will decrease Outcome: Adequate for Discharge   Problem: Elimination: Goal: Will not experience complications related to bowel motility Outcome: Adequate for Discharge Goal: Will not experience complications  related to urinary retention Outcome: Adequate for Discharge   Problem: Pain Managment: Goal: General experience of comfort will improve Outcome: Adequate for Discharge   Problem: Safety: Goal: Ability to remain free from injury will improve Outcome: Adequate for Discharge   Problem: Skin Integrity: Goal: Risk for impaired skin integrity will decrease Outcome: Adequate for Discharge   

## 2021-11-01 NOTE — Progress Notes (Signed)
Mobility Specialist Progress Note:   11/01/21 1116  Mobility  Activity Ambulated with assistance in hallway  Level of Assistance Standby assist, set-up cues, supervision of patient - no hands on  Assistive Device None  Distance Ambulated (ft) 120 ft  Activity Response Tolerated well  $Mobility charge 1 Mobility   Pt received EOB willing to participate in mobility. Complaints of feeling SOB requiring 1 short standing break. Left EOB with call bell in reach and all needs met.   [name] Mobility Specialist  

## 2021-11-01 NOTE — Discharge Summary (Signed)
Discharge Summary  Melinda HARRON ZOX:[id number] DOB: 1940-08-26  PCP: Pablo Lawrence, NP  Admit date: 10/30/2021 Discharge date: 11/01/2021    Time spent: 34mns  Recommendations for Outpatient Follow-up:  F/u with PCP for hospital discharge follow up, already scheduled F/u with cardiology afib clinic as scheduled   Need to repeat cbc/bmp at hospital discharge follow up to monitor hgb and potassium    Discharge Diagnoses:  Active Hospital Problems   Diagnosis Date Noted   NSTEMI (non-ST elevated myocardial infarction) ([facility] 10/30/2021   Hypertensive emergency 10/31/2021   [place] ([place] 10/31/20[place]   Hypothyroidism 10/31/2021   Hyperlipidemia 09/05/2020   Anemia 04/04/2013   GERD 12/14/2009    Resolved Hospital Problems  No resolved problems to display.    Discharge Condition: stable  Diet recommendation: heart healthy  Filed Weights   10/30/21 2353 10/31/21 0003 11/01/21 0016  Weight: 72.2 kg 72.2 kg 69.1 kg    History of present illness:   History of hypothyroidism, HTN, HLD, CAD ( (non-obstructive by cath 2021), persistent afib s/p multiple cardioversions and ablation on 7/3 who presents with shortness of breath and intermittent chest pain. CTA noted no PE, small b/l pleural effusions, emphysema Echo with LVEF 60-65%, no WMA, no pericardial effusion  Hospital Course:  Principal Problem:   [place] (non[place]ST elevated [place] infarction) [place] Active Problems:   GERD   Anemia   Hyperlipidemia   Hypertensive emergency   Atrial fibrillation, chronic (HCC)   Hypothyroidism   Assessment and Plan:  Afib,  persistent afib s/p multiple cardioversions and ablation on 7/3  Presented with chest pain and short of breath found to be in A-fib Seen by EP, Not unusual to bump in/out after ablation Continue home meds metoprolol, amiodarone, Xarelto Close follow-up with EP   SOB CTA and echo result see above comment Sob Likely  multifactorial including A-fib and small pleural effusions EP though slight volume overload likely due to in intra procedural fluids on 7/3 One dose of iv lasix given Chest pain and sob has resolved at discharge  Hypokalemia Likely due to Lasix Replaced EP to arrange for repeat labs next week  Chest pain Resolved Echo result as above Seen by cards/EP, thought mild troponin elevation secondary to recent ablation No ischemic EKG changes EP recommend to  continue colchicine twice a day to finish total 5 days treatment  Hypertensive emergency - Hypertensive emergency with blood pressures as high as 200s over 90s -Elevated blood pressure on presentation likely due to pain and stress, treated with Norvasc and hydralazine in the ED -Blood pressure has normalized -Close follow-up with cardiology/pcp  Hyperlipidemia Continue statin  Hypothyroidism  continue Synthroid  Normocytic anemia No overt bleeding Hemoglobin 10.9 at discharge Follow-up with PCP    Discharge Exam: BP 128/76 (BP Location: Left Arm)   Pulse 91   Temp 98.4 F (36.9 C) (Oral)   Resp 18   Ht '5\' 5"'$  (1.651 m)   Wt 69.1 kg   SpO2 98%   BMI 25.35 kg/m   General: NAD, pleasant Cardiovascular: IRRR Respiratory: Normal respiratory effort    Discharge Instructions     Diet - low sodium heart healthy   Complete by: As directed    Increase activity slowly   Complete by: As directed       Allergies as of 11/01/2021       Reactions   Penicillins Anaphylaxis, Swelling, Other (See Comments)   Blisters Has patient had a PCN reaction causing immediate rash, facial/tongue/throat  swelling, SOB or lightheadedness with hypotension: Yes Has patient had a PCN reaction causing severe rash involving mucus membranes or skin necrosis: No Has patient had a PCN reaction that required hospitalization No Has patient had a PCN reaction occurring within the last 10 years: No If all of the above answers are "NO", then may  proceed with Cephalosporin use.   Eliquis [apixaban] Hives        Medication List     STOP taking these medications    omeprazole 20 MG capsule Commonly known as: PRILOSEC       TAKE these medications    albuterol 108 (90 Base) MCG/ACT inhaler Commonly known as: VENTOLIN HFA Inhale 1-2 puffs into the lungs every 6 (six) hours as needed for shortness of breath or wheezing.   amiodarone 200 MG tablet Commonly known as: PACERONE 1 tablet by mouth (200 mg total) daily.   colchicine 0.6 MG tablet Take 1 tablet (0.6 mg total) by mouth 2 (two) times daily for 5 days.   diphenhydrAMINE 25 MG tablet Commonly known as: BENADRYL Take 25 mg by mouth daily as needed for allergies.   docusate sodium 100 MG capsule Commonly known as: COLACE Take 100 mg by mouth daily as needed for mild constipation.   HYDROcodone-acetaminophen 5-325 MG tablet Commonly known as: NORCO/VICODIN Take 1 tablet by mouth every 8 (eight) hours as needed for moderate pain (back pain.).   levothyroxine 25 MCG tablet Commonly known as: SYNTHROID Take 25 mcg by mouth daily before breakfast.   metoprolol succinate 100 MG 24 hr tablet Commonly known as: Toprol XL Take 0.5 daily with or immediately following a meal. What changed:  how much to take when to take this additional instructions   nitroGLYCERIN 0.4 MG SL tablet Commonly known as: NITROSTAT Place 0.4 mg under the tongue every 5 (five) minutes as needed for chest pain.   pantoprazole 40 MG tablet Commonly known as: Protonix Take 1 tablet (40 mg total) by mouth daily.   rivaroxaban 20 MG Tabs tablet Commonly known as: XARELTO Take 1 tablet (20 mg total) by mouth daily with supper.   rosuvastatin 5 MG tablet Commonly known as: CRESTOR TAKE ONE TABLET BY MOUTH DAILY   Salonpas 3.05-03-08 % Ptch Generic drug: Camphor-Menthol-Methyl Sal Apply 1 patch topically at bedtime as needed (pain).       Allergies  Allergen Reactions    Penicillins Anaphylaxis, Swelling and Other (See Comments)    Blisters Has patient had a PCN reaction causing immediate rash, facial/tongue/throat swelling, SOB or lightheadedness with hypotension: Yes Has patient had a PCN reaction causing severe rash involving mucus membranes or skin necrosis: No Has patient had a PCN reaction that required hospitalization No Has patient had a PCN reaction occurring within the last 10 years: No If all of the above answers are "NO", then may proceed with Cephalosporin use.    Eliquis [Apixaban] Hives    Follow-up Information     Pablo Lawrence, NP. Go on 11/15/2021.   Specialty: Adult Health Nurse Practitioner Why: '@12'$ :30pm Contact information: [place] [address] [phone]         Satira Sark, MD .   Specialty: Cardiology Contact information: [place] [address] [phone]         Vickie Epley, MD .   Specialties: Cardiology, Radiology Contact information: [address] [place] [address] [phone]  repeat basic lab works including cbc/bmp next week Follow up.   Why: need to monitor anemia and potassium level after discharge.                 The results of significant diagnostics from this hospitalization (including imaging, microbiology, ancillary and laboratory) are listed below for reference.    Significant Diagnostic Studies: ECHOCARDIOGRAM COMPLETE  Result Date: 10/31/2021    ECHOCARDIOGRAM REPORT   Patient Name:   LABRIA Gould Date of Exam: 10/31/2021 Medical Rec #:  [id number]      Height:       65.0 in Accession #:    [id number]     Weight:       159.2 lb Date of Birth:  Aug 24, 1940     BSA:          1.795 m Patient Age:    20 years       BP:           128/69 mmHg Patient Gender: F              HR:           92 bpm. Exam Location:  Inpatient Procedure: 2D Echo, Cardiac Doppler and Color Doppler Indications:    Pericardial  effusion I31.3  History:        Patient has prior history of Echocardiogram examinations, most                 recent 03/28/2021. CAD, Arrythmias:Atrial Fibrillation; Risk                 Factors:Hypertension. Breast Cancer.  Sonographer:    Ronny Flurry Sonographer#2:  Darlina Sicilian RDCS Referring Phys: [id number] ASIA B Ramsey IMPRESSIONS  1. No significant pericardial effusion.  2. Left ventricular ejection fraction, by estimation, is 60 to 65%. The left ventricle has normal function. The left ventricle has no regional wall motion abnormalities. Left ventricular diastolic parameters are indeterminate.  3. Right ventricular systolic function is normal. The right ventricular size is normal.  4. Left atrial size was mildly dilated.  5. The mitral valve is normal in structure. Mild mitral valve regurgitation. No evidence of mitral stenosis.  6. The aortic valve is normal in structure. Aortic valve regurgitation is not visualized. No aortic stenosis is present.  7. The inferior vena cava is normal in size with greater than 50% respiratory variability, suggesting right atrial pressure of 3 mmHg. FINDINGS  Left Ventricle: Left ventricular ejection fraction, by estimation, is 60 to 65%. The left ventricle has normal function. The left ventricle has no regional wall motion abnormalities. The left ventricular internal cavity size was normal in size. There is  no left ventricular hypertrophy. Left ventricular diastolic parameters are indeterminate. Right Ventricle: The right ventricular size is normal. No increase in right ventricular wall thickness. Right ventricular systolic function is normal. Left Atrium: Left atrial size was mildly dilated. Right Atrium: Right atrial size was normal in size. Pericardium: There is no evidence of pericardial effusion. Mitral Valve: The mitral valve is normal in structure. Mild mitral valve regurgitation. No evidence of mitral valve stenosis. Tricuspid Valve: The tricuspid valve is  normal in structure. Tricuspid valve regurgitation is mild . No evidence of tricuspid stenosis. Aortic Valve: The aortic valve is normal in structure. Aortic valve regurgitation is not visualized. No aortic stenosis is present. Pulmonic Valve: The pulmonic valve was normal in structure. Pulmonic valve regurgitation is trivial. No evidence of pulmonic stenosis. Aorta: The aortic  root is normal in size and structure. Venous: The inferior vena cava is normal in size with greater than 50% respiratory variability, suggesting right atrial pressure of 3 mmHg. IAS/Shunts: No atrial level shunt detected by color flow Doppler.  LEFT VENTRICLE PLAX 2D LVIDd:         4.00 cm   Diastology LVIDs:         2.50 cm   LV e' medial:    7.34 cm/s LV PW:         1.00 cm   LV E/e' medial:  19.5 LV IVS:        1.10 cm   LV e' lateral:   7.89 cm/s LVOT diam:     1.70 cm   LV E/e' lateral: 18.1 LV SV:         47 LV SV Index:   26 LVOT Area:     2.27 cm  RIGHT VENTRICLE RV S prime:     9.57 cm/s TAPSE (M-mode): 1.4 cm LEFT ATRIUM             Index        RIGHT ATRIUM           Index LA diam:        3.20 cm 1.78 cm/m   RA Area:     21.90 cm LA Vol (A2C):   78.0 ml 43.45 ml/m  RA Volume:   61.00 ml  33.98 ml/m LA Vol (A4C):   60.8 ml 33.87 ml/m LA Biplane Vol: 69.8 ml 38.88 ml/m  AORTIC VALVE LVOT Vmax:   114.00 cm/s LVOT Vmean:  73.000 cm/s LVOT VTI:    0.207 m  AORTA Ao Root diam: 2.70 cm Ao Asc diam:  3.00 cm MITRAL VALVE MV Area (PHT): 3.95 cm     SHUNTS MV Decel Time: 192 msec     Systemic VTI:  0.21 m MV E velocity: 143.00 cm/s  Systemic Diam: 1.70 cm Candee Furbish MD Electronically signed by Candee Furbish MD Signature Date/Time: 10/31/2021/10:38:01 AM    Final    CT Angio Chest PE W and/or Wo Contrast  Result Date: 10/30/2021 CLINICAL DATA:  Pulmonary embolism suspected, positive D-dimer. Recent atrial fibrillation ablation. Chest pain and shortness of breath. Retroperitoneal bleed suspected. EXAM: CT ANGIOGRAPHY CHEST CT ABDOMEN  AND PELVIS WITH CONTRAST TECHNIQUE: Multidetector CT imaging of the chest was performed using the standard protocol during bolus administration of intravenous contrast. Multiplanar CT image reconstructions and MIPs were obtained to evaluate the vascular anatomy. Multidetector CT imaging of the abdomen and pelvis was performed using the standard protocol during bolus administration of intravenous contrast. RADIATION DOSE REDUCTION: This exam was performed according to the departmental dose-optimization program which includes automated exposure control, adjustment of the mA and/or kV according to patient size and/or use of iterative reconstruction technique. CONTRAST:  181m OMNIPAQUE IOHEXOL 300 MG/ML  SOLN COMPARISON:  10/21/2021, 10/22/2020. FINDINGS: CTA CHEST FINDINGS Cardiovascular: The heart is borderline enlarged and there is a trace pericardial effusion. Scattered coronary artery calcifications are noted. There is atherosclerotic calcification of the aorta without evidence of aneurysm. The pulmonary trunk is normal in caliber. No pulmonary artery filling defect is identified. Mediastinum/Nodes: Shotty lymph nodes are present in the mediastinum. No hilar or axillary lymphadenopathy. The thyroid gland, trachea, and esophagus are within normal limits. Lungs/Pleura: Emphysematous changes are present in the lungs. Subpleural reticulation and interlobular septal thickening are noted. There are small bilateral pleural effusions with atelectasis. No pneumothorax. Musculoskeletal: A left  breast implant is noted. Mastectomy changes with breast reconstruction are noted on the right. Degenerative changes are present in the thoracic spine. No acute osseous abnormality. Review of the MIP images confirms the above findings. CT ABDOMEN and PELVIS FINDINGS Hepatobiliary: Stable scattered cysts are present in the liver. No biliary ductal dilatation. The gallbladder is surgically absent. Pancreas: Unremarkable. No pancreatic  ductal dilatation or surrounding inflammatory changes. Spleen: Normal in size without focal abnormality. Adrenals/Urinary Tract: No adrenal nodule or mass. The kidneys enhance symmetrically. No renal calculus or hydronephrosis. Subcentimeter hypodensities are noted in the kidneys bilaterally which are too small to further characterize. The visualized portion of the urinary bladder is within normal limits. Examination is limited due to hardware artifact. Stomach/Bowel: Stomach is within normal limits. Appendix appears normal. No evidence of bowel wall thickening, distention, or inflammatory changes. No free air or pneumatosis. Scattered diverticula are present along the colon without evidence of diverticulitis. Vascular/Lymphatic: Aortic atherosclerosis. No enlarged abdominal or pelvic lymph nodes. Reproductive: Status post hysterectomy. No adnexal masses. Other: A trace amount of free fluid is noted in the pelvis on the right. Musculoskeletal: Degenerative changes are present in the lumbar spine. Bilateral hip arthroplasty changes are noted. Lumbar spinal fusion hardware and laminectomy changes are present in the lower lumbar spine. Review of the MIP images confirms the above findings. IMPRESSION: 1. No evidence of pulmonary embolism. 2. Small bilateral pleural effusions with atelectasis. 3. Emphysema with fibrotic changes. 4. No acute process in the abdomen and pelvis. 5. Aortic atherosclerosis and coronary artery calcifications. Electronically Signed   By: Brett Fairy M.D.   On: 10/30/2021 20:24   CT ABDOMEN PELVIS W CONTRAST  Result Date: 10/30/2021 CLINICAL DATA:  Pulmonary embolism suspected, positive D-dimer. Recent atrial fibrillation ablation. Chest pain and shortness of breath. Retroperitoneal bleed suspected. EXAM: CT ANGIOGRAPHY CHEST CT ABDOMEN AND PELVIS WITH CONTRAST TECHNIQUE: Multidetector CT imaging of the chest was performed using the standard protocol during bolus administration of  intravenous contrast. Multiplanar CT image reconstructions and MIPs were obtained to evaluate the vascular anatomy. Multidetector CT imaging of the abdomen and pelvis was performed using the standard protocol during bolus administration of intravenous contrast. RADIATION DOSE REDUCTION: This exam was performed according to the departmental dose-optimization program which includes automated exposure control, adjustment of the mA and/or kV according to patient size and/or use of iterative reconstruction technique. CONTRAST:  177m OMNIPAQUE IOHEXOL 300 MG/ML  SOLN COMPARISON:  10/21/2021, 10/22/2020. FINDINGS: CTA CHEST FINDINGS Cardiovascular: The heart is borderline enlarged and there is a trace pericardial effusion. Scattered coronary artery calcifications are noted. There is atherosclerotic calcification of the aorta without evidence of aneurysm. The pulmonary trunk is normal in caliber. No pulmonary artery filling defect is identified. Mediastinum/Nodes: Shotty lymph nodes are present in the mediastinum. No hilar or axillary lymphadenopathy. The thyroid gland, trachea, and esophagus are within normal limits. Lungs/Pleura: Emphysematous changes are present in the lungs. Subpleural reticulation and interlobular septal thickening are noted. There are small bilateral pleural effusions with atelectasis. No pneumothorax. Musculoskeletal: A left breast implant is noted. Mastectomy changes with breast reconstruction are noted on the right. Degenerative changes are present in the thoracic spine. No acute osseous abnormality. Review of the MIP images confirms the above findings. CT ABDOMEN and PELVIS FINDINGS Hepatobiliary: Stable scattered cysts are present in the liver. No biliary ductal dilatation. The gallbladder is surgically absent. Pancreas: Unremarkable. No pancreatic ductal dilatation or surrounding inflammatory changes. Spleen: Normal in size without focal abnormality. Adrenals/Urinary Tract: No  adrenal nodule or  mass. The kidneys enhance symmetrically. No renal calculus or hydronephrosis. Subcentimeter hypodensities are noted in the kidneys bilaterally which are too small to further characterize. The visualized portion of the urinary bladder is within normal limits. Examination is limited due to hardware artifact. Stomach/Bowel: Stomach is within normal limits. Appendix appears normal. No evidence of bowel wall thickening, distention, or inflammatory changes. No free air or pneumatosis. Scattered diverticula are present along the colon without evidence of diverticulitis. Vascular/Lymphatic: Aortic atherosclerosis. No enlarged abdominal or pelvic lymph nodes. Reproductive: Status post hysterectomy. No adnexal masses. Other: A trace amount of free fluid is noted in the pelvis on the right. Musculoskeletal: Degenerative changes are present in the lumbar spine. Bilateral hip arthroplasty changes are noted. Lumbar spinal fusion hardware and laminectomy changes are present in the lower lumbar spine. Review of the MIP images confirms the above findings. IMPRESSION: 1. No evidence of pulmonary embolism. 2. Small bilateral pleural effusions with atelectasis. 3. Emphysema with fibrotic changes. 4. No acute process in the abdomen and pelvis. 5. Aortic atherosclerosis and coronary artery calcifications. Electronically Signed   By: Brett Fairy M.D.   On: 10/30/2021 20:24   DG Chest Port 1 View  Result Date: 10/30/2021 CLINICAL DATA:  Chest pain, shortness of breath EXAM: PORTABLE CHEST 1 VIEW COMPARISON:  03/27/2021 FINDINGS: Chronic interstitial changes. Increased density at the periphery of the lower right lung. Stable cardiomediastinal contours. IMPRESSION: Increased patchy density at the periphery of the lower right lung may reflect atelectasis, scarring, or consolidation. Electronically Signed   By: Macy Mis M.D.   On: 10/30/2021 18:12   EP STUDY  Result Date: 10/28/2021 CONCLUSIONS: 1. Successful PVI 2. Successful  ablation/isolation of the posterior wall 3. Intracardiac echo reveals dilated LA, small left PV's and large right PV's and trivial pericardial effusion 4. No early apparent complications. 5. Colchicine 0.'6mg'$  PO BID x 5 days 6. Protonix '40mg'$  PO daily x 45 days   CT CARDIAC MORPH/PULM VEIN W/CM&W/O CA SCORE  Addendum Date: 10/21/2021   ADDENDUM REPORT: 10/21/2021 17:09 CLINICAL DATA:  Atrial fibrillation scheduled for ablation. EXAM: Cardiac CTA TECHNIQUE: A non-contrast, gated CT scan was obtained with axial slices of 3 mm through the heart for calcium scoring. Calcium scoring was performed using the Agatston method. A 120 kV retrospective, gated, contrast cardiac scan was obtained. Gantry rotation speed was 250 msecs and collimation was 0.6 mm. Nitroglycerin was not given. A delayed scan was obtained to exclude left atrial appendage thrombus. The 3D dataset was reconstructed in 5% intervals of the 0-95% of the R-R cycle. Late systolic phases were analyzed on a dedicated workstation using MPR, MIP, and VRT modes. The patient received 80 cc of contrast. FINDINGS: Image quality: Excellent, average, poor. Pulmonary Veins: There is normal pulmonary vein drainage into the left atrium (2 on the right and 2 on the left) with ostial measurements as follows: RUPV: Ostium 21.9 x 17.1 mm  area 2.98 cm^2 RLPV:  Ostium 25.9 x 22.5 mm  area 4.47 cm^2 LUPV:  Ostium 19.4 x 16.7 mm area 2.58 cm^2 LLPV: Ostium 18.1 x 10.6 mm area 1.35 cm^2. This vein is in close proximity to the descending thoracic aorta. Left Atrium: The left atrial size is moderately dilated. There is no PFO/ASD. There is no thrombus in the left atrial appendage on contrast or delayed imaging. The esophagus runs in the left atrial midline and is not in proximity to any of the pulmonary vein ostia. Coronary Arteries: CAC score  of 1362 Agatston units, which is 93rd percentile for age-, race-, and sex-matched controls. Normal coronary origin. Right dominance. The  study was performed without use of NTG and is insufficient for plaque evaluation. Right Atrium: Right atrial size is moderately dilated. Right Ventricle: The right ventricular cavity is within normal limits. Left Ventricle: The ventricular cavity size is within normal limits. There are no stigmata of prior infarction. There is no abnormal filling defect. Pericardium: Normal thickness with no significant effusion or calcium present. Pulmonary Artery: Normal caliber without proximal filling defect. Aorta: Normal caliber with no significant disease. Extra-cardiac findings: See attached radiology report for non-cardiac structures. IMPRESSION: 1. There is normal pulmonary vein drainage into the left atrium with ostial measurements above. 2. There is no thrombus in the left atrial appendage. 3. The esophagus runs in the left atrial midline and is not in proximity to any of the pulmonary vein ostia. 4. No PFO/ASD. 5. Normal coronary origin. Right dominance. 6. CAC score of 1362 Agatston units which is 93rd percentile for age-, race-, and sex-matched controls. Loralie Champagne, MD Electronically Signed   By: Loralie Champagne M.D.   On: 10/21/2021 17:09   Result Date: 10/21/2021 EXAM: OVER-READ INTERPRETATION  CT CHEST The following report is a limited chest CT over-read performed by radiologist Dr. Zetta Bills of [facility], [place] on 10/21/2021. Imaging of the chest is confined to the mid chest and cardiac structures only. The coronary artery calcium score and contrasted atrial mapping evaluation interpretation by the cardiologist is attached. COMPARISON:  March 29, 2004. FINDINGS: Vascular: Please see dedicated report for cardiovascular details. There is coronary artery disease and aortic atherosclerosis. Mediastinum/Nodes: No signs of adenopathy or acute process in visualized portions of the chest in the mediastinum. Mildly patulous esophagus. Lungs/Pleura: Basilar atelectasis. Septal thickening and subpleural  reticulation. The paraseptal emphysema. Upper Abdomen: Incidental imaging of upper abdominal contents shows no acute process. Musculoskeletal: Post RIGHT mastectomy and breast reconstruction. Implant in place over the LEFT chest. No acute bone finding or destructive bone process to the extent evaluated. IMPRESSION: 1. Signs signs of pulmonary emphysema with potential interstitial lung disease. Correlate with respiratory symptoms and consider pulmonology follow-up with high-resolution chest CT as warranted. 2. Coronary artery disease and aortic atherosclerosis. 3. Post RIGHT mastectomy and breast reconstruction also with LEFT breast implant. Aortic Atherosclerosis (ICD10-I70.0) and Emphysema (ICD10-J43.9). Electronically Signed: By: Zetta Bills M.D. On: 10/21/2021 13:50    Microbiology: No results found for this or any previous visit (from the past 240 hour(s)).   Labs: Basic Metabolic Panel: Recent Labs  Lab 10/30/21 1822 11/01/21 0221  NA 133* 134*  K 4.2 3.1*  CL 102 100  CO2 24 24  GLUCOSE 122* 111*  BUN 29* 22  CREATININE 0.97 1.07*  CALCIUM 9.0 9.2  MG  --  1.7   Liver Function Tests: No results for input(s): "AST", "ALT", "ALKPHOS", "BILITOT", "PROT", "ALBUMIN" in the last 168 hours. No results for input(s): "LIPASE", "AMYLASE" in the last 168 hours. No results for input(s): "AMMONIA" in the last 168 hours. CBC: Recent Labs  Lab 10/30/21 1822 11/01/21 0221  WBC 11.1* 8.0  HGB 9.7* 10.9*  HCT 29.5* 31.3*  MCV 96.7 92.1  PLT 219 248   Cardiac Enzymes: No results for input(s): "CKTOTAL", "CKMB", "CKMBINDEX", "TROPONINI" in the last 168 hours. BNP: BNP (last 3 results) Recent Labs    10/30/21 1822  BNP 435.0*    ProBNP (last 3 results) No results for input(s): "PROBNP" in the  last 8760 hours.  CBG: No results for input(s): "GLUCAP" in the last 168 hours.  FURTHER DISCHARGE INSTRUCTIONS:   Get Medicines reviewed and adjusted: Please take all your medications  with you for your next visit with your Primary MD   Laboratory/radiological data: Please request your Primary MD to go over all hospital tests and procedure/radiological results at the follow up, please ask your Primary MD to get all Hospital records sent to his/her office.   In some cases, they will be blood work, cultures and biopsy results pending at the time of your discharge. Please request that your primary care M.D. goes through all the records of your hospital data and follows up on these results.   Also Note the following: If you experience worsening of your admission symptoms, develop shortness of breath, life threatening emergency, suicidal or homicidal thoughts you must seek medical attention immediately by calling 911 or calling your MD immediately  if symptoms less severe.   You must read complete instructions/literature along with all the possible adverse reactions/side effects for all the Medicines you take and that have been prescribed to you. Take any new Medicines after you have completely understood and accpet all the possible adverse reactions/side effects.    Do not drive when taking Pain medications or sleeping medications (Benzodaizepines)   Do not take more than prescribed Pain, Sleep and Anxiety Medications. It is not advisable to combine anxiety,sleep and pain medications without talking with your primary care practitioner   Special Instructions: If you have smoked or chewed Tobacco  in the last 2 yrs please stop smoking, stop any regular Alcohol  and or any Recreational drug use.   Wear Seat belts while driving.   Please note: You were cared for by a hospitalist during your hospital stay. Once you are discharged, your primary care physician will handle any further medical issues. Please note that NO REFILLS for any discharge medications will be authorized once you are discharged, as it is imperative that you return to your primary care physician (or establish a  relationship with a primary care physician if you do not have one) for your post hospital discharge needs so that they can reassess your need for medications and monitor your lab values.     Signed:  Florencia Reasons MD, PhD, FACP  [facility] 11/01/2021, 10:51 AM

## 2021-11-01 NOTE — Progress Notes (Addendum)
Progress Note  Patient Name: [name] Date of Encounter: 11/01/2021  [facility] Cardiologist: Rozann Lesches, MD   Subjective   Feels better  Inpatient Medications    Scheduled Meds:  amiodarone  200 mg Oral Daily   amLODipine  5 mg Oral Daily   colchicine  0.6 mg Oral BID   levothyroxine  25 mcg Oral Q0600   metoprolol succinate  50 mg Oral Daily   pantoprazole  40 mg Oral Daily   potassium chloride  40 mEq Oral Q3H   rivaroxaban  20 mg Oral Q supper   rosuvastatin  5 mg Oral Daily   sucralfate  1 g Oral TID WC & HS   Continuous Infusions:  PRN Meds: acetaminophen, diphenhydrAMINE, hydrALAZINE, ibuprofen, ondansetron (ZOFRAN) IV   Vital Signs    Vitals:   10/31/21 1642 10/31/21 2043 11/01/21 0016 11/01/21 0415  BP: 120/67 115/65  128/76  Pulse: 99 99  91  Resp: '19 16  18  '$ Temp: 97.7 F (36.5 C) 98.5 F (36.9 C)  98.4 F (36.9 C)  TempSrc: Oral Oral  Oral  SpO2: 94% 96%  98%  Weight:   69.1 kg   Height:        Intake/Output Summary (Last 24 hours) at 11/01/2021 2542 Last data filed at 11/01/2021 0802 Gross per 24 hour  Intake 1197 ml  Output 2900 ml  Net -1703 ml      11/01/2021   12:16 AM 10/31/2021   12:03 AM 10/30/2021   11:53 PM  Last 3 Weights  Weight (lbs) 152 lb 5.4 oz 159 lb 2.8 oz 159 lb 2.8 oz  Weight (kg) 69.1 kg 72.2 kg 72.2 kg      Telemetry    AFib 90's-120's, mostly low 100s - Personally Reviewed  ECG    No new EKGs - Personally Reviewed  Physical Exam   GEN: No acute distress.   Neck: No JVD Cardiac: irreg-irreg, no murmurs, rubs, or gallops.  Respiratory: CTA b/l. GI: Soft, nontender, non-distended  MS: No edema; No deformity. Neuro:  Nonfocal  Psych: Normal affect   Labs    High Sensitivity Troponin:   Recent Labs  Lab 10/30/21 1822 10/30/21 2016 10/31/21 0100  TROPONINIHS 458* 423* 403*     Chemistry Recent Labs  Lab 10/30/21 1822 11/01/21 0221  NA 133* 134*  K 4.2 3.1*  CL 102 100  CO2 24 24   GLUCOSE 122* 111*  BUN 29* 22  CREATININE 0.97 1.07*  CALCIUM 9.0 9.2  MG  --  1.7  GFRNONAA 59* 52*  ANIONGAP 7 10    Lipids  Recent Labs  Lab 10/31/21 0100  CHOL 128  TRIG 57  HDL 61  LDLCALC 56  CHOLHDL 2.1    Hematology Recent Labs  Lab 10/30/21 1822 10/31/21 0100 11/01/21 0221  WBC 11.1*  --  8.0  RBC 3.05* 3.22* 3.40*  HGB 9.7*  --  10.9*  HCT 29.5*  --  31.3*  MCV 96.7  --  92.1  MCH 31.8  --  32.1  MCHC 32.9  --  34.8  RDW 13.8  --  13.4  PLT 219  --  248   Thyroid  Recent Labs  Lab 10/31/21 0405  TSH 1.718    BNP Recent Labs  Lab 10/30/21 1822  BNP 435.0*    DDimer  Recent Labs  Lab 10/30/21 1822  DDIMER 0.93*     Radiology     Cardiac Studies  10/31/21: TTE IMPRESSIONS   1. No significant pericardial effusion.   2. Left ventricular ejection fraction, by estimation, is 60 to 65%. The  left ventricle has normal function. The left ventricle has no regional  wall motion abnormalities. Left ventricular diastolic parameters are  indeterminate.   3. Right ventricular systolic function is normal. The right ventricular  size is normal.   4. Left atrial size was mildly dilated.   5. The mitral valve is normal in structure. Mild mitral valve  regurgitation. No evidence of mitral stenosis.   6. The aortic valve is normal in structure. Aortic valve regurgitation is  not visualized. No aortic stenosis is present.   7. The inferior vena cava is normal in size with greater than 50%  respiratory variability, suggesting right atrial pressure of 3 mmHg.      10/28/21; EPS/ablation CONCLUSIONS: 1. Successful PVI 2. Successful ablation/isolation of the posterior wall 3. Intracardiac echo reveals dilated LA, small left PV's and large right PV's and trivial pericardial effusion 4. No early apparent complications. 5. Colchicine 0.'6mg'$  PO BID x 5 days 6. Protonix '40mg'$  PO daily x 45 days   Patient Profile     81 y.o. female with a hx of persistent AFib,  HTN, HLD, CAD (non-obstructive by cath 2021) admitted with SOB, found volume OL  Assessment & Plan    SOB Volume OL Resolved Had brisk diuresis Lungs are clear this AM after IV lasix dose, lsuspect 2/2 intra procedural IVF K+ replacement ordered    3. CP Not an ongoing c/o HS Trops are 2/2 recent ablation and are flat No ischemic EKG changes    3. Persistent AFib CHA2DS2Vasc is 5, on Xarelto, continue without interruption. Arrived in SR, now back in Afib, rates look generally ok Not unusual to bump in/out after ablation   Dr. Quentin Ore has seen the patient OK to discharged from EP perspective after K+ replaced Early follow up is arranged in the AFib clinic for her No lasix or carafate for home  Dental infection reported by the pt, deferred to IM and/or her dentist    For questions or updates, please contact [facility] Please consult [email or web address] for contact info under        Signed, Baldwin Jamaica, PA-C  11/01/2021, 9:22 AM

## 2021-11-07 NOTE — Telephone Encounter (Signed)
Spoke to patient post hospital visit to see how she was doing. Stated her fluid level still seems to be okay with no notable swelling. She is continuing to get a little better every day since she left the hospital. Reminded of up coming post hospital visit. Verbalized understanding will read back.

## 2021-11-07 NOTE — Telephone Encounter (Signed)
See other telephone encounter.

## 2021-11-07 NOTE — Telephone Encounter (Addendum)
Left message to call back  

## 2021-11-11 ENCOUNTER — Ambulatory Visit (HOSPITAL_COMMUNITY)
Admit: 2021-11-11 | Discharge: 2021-11-11 | Disposition: A | Discharge status: Home / Self Care | Payer: Medicare Other | Attending: Physician Assistant | Admitting: Physician Assistant

## 2021-11-11 VITALS — BP 164/76 | HR 102 | Ht 65.0 in | Wt 155.2 lb

## 2021-11-11 DIAGNOSIS — I2510 Atherosclerotic heart disease of native coronary artery without angina pectoris: Secondary | ICD-10-CM | POA: Diagnosis not present

## 2021-11-11 DIAGNOSIS — I4819 Other persistent atrial fibrillation: Secondary | ICD-10-CM

## 2021-11-11 DIAGNOSIS — I272 Pulmonary hypertension, unspecified: Secondary | ICD-10-CM | POA: Insufficient documentation

## 2021-11-11 DIAGNOSIS — I10 Essential (primary) hypertension: Secondary | ICD-10-CM | POA: Insufficient documentation

## 2021-11-11 DIAGNOSIS — Z7901 Long term (current) use of anticoagulants: Secondary | ICD-10-CM | POA: Diagnosis not present

## 2021-11-11 DIAGNOSIS — D6869 Other thrombophilia: Secondary | ICD-10-CM

## 2021-11-11 DIAGNOSIS — I482 Chronic atrial fibrillation, unspecified: Secondary | ICD-10-CM

## 2021-11-11 LAB — BASIC METABOLIC PANEL
Anion gap: 11 (ref 5–15)
BUN: 21 mg/dL (ref 8–23)
CO2: 26 mmol/L (ref 22–32)
Calcium: 10.1 mg/dL (ref 8.9–10.3)
Chloride: 101 mmol/L (ref 98–111)
Creatinine, Ser: 1.34 mg/dL — ABNORMAL HIGH (ref 0.44–1.00)
GFR, Estimated: 40 mL/min — ABNORMAL LOW (ref >60)
Glucose, Bld: 104 mg/dL — ABNORMAL HIGH (ref 70–99)
Potassium: 4.7 mmol/L (ref 3.5–5.1)
Sodium: 138 mmol/L (ref 135–145)

## 2021-11-11 LAB — CBC
HCT: 35.7 % — ABNORMAL LOW (ref 36.0–46.0)
Hemoglobin: 11.9 g/dL — ABNORMAL LOW (ref 12.0–15.0)
MCH: 31.7 pg (ref 26.0–34.0)
MCHC: 33.3 g/dL (ref 30.0–36.0)
MCV: 95.2 fL (ref 80.0–100.0)
Platelets: 321 10*3/uL (ref 150–400)
RBC: 3.75 MIL/uL — ABNORMAL LOW (ref 3.87–5.11)
RDW: 13.6 % (ref 11.5–15.5)
WBC: 11.7 10*3/uL — ABNORMAL HIGH (ref 4.0–10.5)
nRBC: 0 % (ref 0.0–0.2)

## 2021-11-11 NOTE — Progress Notes (Signed)
Primary Care Physician: Pablo Lawrence, NP Referring Physician: Dr. Domenic Polite Primary EP: Dr Rocky Crafts is a 81 y.o. female with a h/o CAD, HTN,  persistent afib, most recent cardioversion with ERAF after starting Multaq. She also had successful  cardioversion earlier in January with ERAF as well. Dr, Domenic Polite referred her to the afib clinic to discuss Tikosyn. She has some lung issues at baseline, including pulmonary HTN so he did not want to use amiodarone. She feels tired in afib but is rate controlled and tolerating ok.  Patient seen by Dr Quentin Ore 07/24/21 and started on amiodarone as a bridge to ablation. She is scheduled for ablation on 10/28/21.   Patient is s/p DCCV 08/13/21. Her Toprol was held for bradycardia afterwards. Unfortunately, she was back in rapid afib the next morning with palpitations and fatigue. She is in afib today. No bleeding issues with anticoagulation.   Follow up in the AF clinic 11/11/21. Patient is s/p afib ablation 10/28/21 with Dr Quentin Ore. She presented to the ED with CP, SOB, and fluid overload, felt to be 2/2 intraprocedral IVF. She was diuresed with improvement in her SOB. She was noted to be going in and out of afib on telemetry. She was given ibuprofen and colchicine for her CP. Patient reports that she felt well post discharge until this past weekend when she had heart racing. She is out of rhythm today.   Today, she denies symptoms of chest pain, shortness of breath, orthopnea, PND, lower extremity edema,  presyncope, syncope, or neurologic sequela. The patient is tolerating medications without difficulties and is otherwise without complaint today.   Past Medical History:  Diagnosis Date   Anemia    Arthritis    Breast cancer ([place])    Right mastectomy   Chronic back pain    Coronary atherosclerosis of native coronary artery    a. 12/2019 Cath: LM nl, LAD 74m LCX 50p, 80d, RCA 20p, 467m>med rx.   Demand ischemia ([place]   a. 02/2021 HsTrop  to 361 in setting of rapid afib; b. 02/2021 Echo: EF 65-70%, no rwma, mild LVH, RVSP 28.71m29m. Mildly to mod dil LA. Triv MR. Ao sclerosis w/o stenosis.   Environmental allergies    Essential hypertension    GERD (gastroesophageal reflux disease)    History of bronchitis    History of colon polyps    History of diverticulosis    History of migraine    Panic attacks    Persistent atrial fibrillation ([place]   a. CHA2DS2VASc = 5-->xarelto.   Prolapse of vaginal vault after hysterectomy    Seasonal allergies    Urinary frequency    Past Surgical History:  Procedure Laterality Date   ABDOMINAL HYSTERECTOMY     ATRIAL FIBRILLATION ABLATION N/A 10/28/2021   Procedure: ATRIAL FIBRILLATION ABLATION;  Surgeon: LamVickie EpleyD;  Location: [facility];  Service: Cardiovascular;  Laterality: N/A;   BACK SURGERY  1981/2011/2014   BLADDER SUSPENSION     BREAST RECONSTRUCTION  2004   with abd tissue   BREAST SURGERY Right    CARDIOVERSION N/A 04/30/2021   Procedure: CARDIOVERSION;  Surgeon: BraArnoldo LenisD;  Location: [facility];  Service: Endoscopy;  Laterality: N/A;   CARDIOVERSION N/A 05/16/2021   Procedure: CARDIOVERSION;  Surgeon: BraArnoldo LenisD;  Location: [facility];  Service: Endoscopy;  Laterality: N/A;   CARDIOVERSION N/A 08/13/2021   Procedure: CARDIOVERSION;  Surgeon: SchDonato HeinzD;  [facility];  Service: Cardiovascular;  Laterality: N/A;   CHOLECYSTECTOMY     COLONOSCOPY  12/2009   RMR: few pancolonic diverticula. next TCS 12/2014   COLONOSCOPY  06/14/2007   XFG:HWEXHBZJIR rectal polyp, status post cold biopsy removal/ Anal canal hemorrhoids/Left-sided diverticula Colonic mucosa appeared normal.Hyperplastic polyp.    COLONOSCOPY  01/18/2004   CVE:LFYBOFB diverticula/Internal hemorrhoids.  Otherwise normal rectum   COLONOSCOPY N/A 04/18/2013   Dr. Gala Romney- normal rectum, scattered left sided diverticula the remainder of the colonic  mucosa appeared normal   COLONOSCOPY N/A 08/18/2014   Procedure: COLONOSCOPY;  Surgeon: Daneil Dolin, MD;  [facility];  Service: Endoscopy;  Laterality: N/A;  1000   ESOPHAGOGASTRODUODENOSCOPY  06/14/2007   PZW:CHENID-POEU plaques in esophageal mucosa of uncertain significance brushed and biopsied/Normal stomach, normal first duodenum and second duodenoscopy. KOH negative. esophageal bx c/w GERD   ESOPHAGOGASTRODUODENOSCOPY N/A 08/18/2014   Procedure: ESOPHAGOGASTRODUODENOSCOPY (EGD);  Surgeon: Daneil Dolin, MD;  [facility];  Service: Endoscopy;  Laterality: N/A;   ESOPHAGOGASTRODUODENOSCOPY (EGD) WITH ESOPHAGEAL DILATION N/A 04/18/2013   Dr. Gala Romney- normal, patent appearing, tubular esophagus, normal gastric mucosa, paptent pylorus, normal first and second portion of the duodenum   EYE SURGERY     lasik   FOOT SURGERY Right 2014   d/t plantar fascitis   GIVENS CAPSULE STUDY N/A 07/18/2013   L.Lewis PAC- markedly abnormal appearing gastric mucosa, sugnificant bile reflux, questionable subucosal small bowel mass versus extrinsic compression in the distal small bowel.- CTE= no small bowel mass or tumor seen.   LEFT HEART CATH AND CORONARY ANGIOGRAPHY N/A 01/16/2020   Procedure: LEFT HEART CATH AND CORONARY ANGIOGRAPHY;  Surgeon: Burnell Blanks, MD;  Location: [facility];  Service: Cardiovascular;  Laterality: N/A;   MALONEY DILATION N/A 08/18/2014   Procedure: Venia Minks DILATION;  Surgeon: Daneil Dolin, MD;  [facility];  Service: Endoscopy;  Laterality: N/A;   MASTECTOMY     right side 12-13 years ago   MOUTH SURGERY     cyst removed in June 2022.   TOTAL HIP ARTHROPLASTY Left 02/29/2016   Procedure: TOTAL HIP ARTHROPLASTY ANTERIOR APPROACH;  Surgeon: Frederik Pear, MD;  Location: [facility];  Service: Orthopedics;  Laterality: Left;   TOTAL HIP ARTHROPLASTY Right 01/05/2017   Procedure: TOTAL HIP ARTHROPLASTY ANTERIOR APPROACH;  Surgeon:  Frederik Pear, MD;  Location: [place] [place];  Service: Orthopedics;  Laterality: Right;  REQUEST 90 MINS    Current Outpatient Medications  Medication Sig Dispense Refill   albuterol (VENTOLIN HFA) 108 (90 Base) MCG/ACT inhaler Inhale 1-2 puffs into the lungs every 6 (six) hours as needed for shortness of breath or wheezing.     amiodarone (PACERONE) 200 MG tablet 1 tablet by mouth (200 mg total) daily.     Camphor-Menthol-Methyl Sal (SALONPAS) 3.05-03-08 % PTCH Apply 1 patch topically at bedtime as needed (pain).     diphenhydrAMINE (BENADRYL) 25 MG tablet Take 25 mg by mouth daily as needed for allergies.     docusate sodium (COLACE) 100 MG capsule Take 100 mg by mouth daily as needed for mild constipation.     HYDROcodone-acetaminophen (NORCO/VICODIN) 5-325 MG tablet Take 1 tablet by mouth every 8 (eight) hours as needed for moderate pain (back pain.).     levothyroxine (SYNTHROID) 25 MCG tablet Take 25 mcg by mouth daily before breakfast.     metoprolol succinate (TOPROL XL) 100 MG 24 hr tablet Take 0.5 daily with or immediately following a meal. (  Patient taking differently: 50 mg daily.  immediately following a meal.) 30 tablet 11   nitroGLYCERIN (NITROSTAT) 0.4 MG SL tablet Place 0.4 mg under the tongue every 5 (five) minutes as needed for chest pain.     pantoprazole (PROTONIX) 40 MG tablet Take 1 tablet (40 mg total) by mouth daily. 45 tablet 0   rivaroxaban (XARELTO) 20 MG TABS tablet Take 1 tablet (20 mg total) by mouth daily with supper. 30 tablet 3   rosuvastatin (CRESTOR) 5 MG tablet TAKE ONE TABLET BY MOUTH DAILY 90 tablet 1   No current facility-administered medications for this encounter.    Allergies  Allergen Reactions   Penicillins Anaphylaxis, Swelling and Other (See Comments)    Blisters Has patient had a PCN reaction causing immediate rash, facial/tongue/throat swelling, SOB or lightheadedness with hypotension: Yes Has patient had a PCN reaction causing severe rash involving  mucus membranes or skin necrosis: No Has patient had a PCN reaction that required hospitalization No Has patient had a PCN reaction occurring within the last 10 years: No If all of the above answers are "NO", then may proceed with Cephalosporin use.    Eliquis [Apixaban] Hives    Social History   Socioeconomic History   Marital status: Widowed    Spouse name: [name]   Number of children: 4   Years of education: 12th   Highest education level: Not on file  Occupational History    Employer: RETIRED  Tobacco Use   Smoking status: Former    Packs/day: 1.50    Years: 30.00    Total pack years: 45.00    Types: Cigarettes    Quit date: 04/28/2000    Years since quitting: 21.5   Smokeless tobacco: Never   Tobacco comments:    Former smoker 07/30/21  Vaping Use   Vaping Use: Never used  Substance and Sexual Activity   Alcohol use: No   Drug use: No   Sexual activity: Not on file    Comment: hyst  Other Topics Concern   Not on file  Social History Narrative   Patient lives at home with spouse.   Caffeine Use: 2 cups of coffee daily   Social Determinants of Health   Financial Resource Strain: Not on file  Food Insecurity: Not on file  Transportation Needs: Not on file  Physical Activity: Not on file  Stress: Not on file  Social Connections: Not on file  Intimate Partner Violence: Not on file    Family History  Problem Relation Age of Onset   Coronary artery disease Mother    Coronary artery disease Father    Cancer Sister        lung cancer, two sisters   Lung cancer Sister    Coronary artery disease Brother    Colon cancer Brother        less than age 26   Cancer Brother    Emphysema Brother    Diabetes Son    Healthy Son    Healthy Son    Healthy Son    Healthy Son     ROS- All systems are reviewed and negative except as per the HPI above  Physical Exam: Vitals:   11/11/21 1016  BP: (!) 164/76  Pulse: (!) 102  Weight: 70.4 kg  Height: '5\' 5"'$  (1.651  m)    Wt Readings from Last 3 Encounters:  11/11/21 70.4 kg  11/01/21 69.1 kg  10/28/21 71.7 kg    Labs: Lab Results  Component  Value Date   NA 134 (L) 11/01/2021   K 3.1 (L) 11/01/2021   CL 100 11/01/2021   CO2 24 11/01/2021   GLUCOSE 111 (H) 11/01/2021   BUN 22 11/01/2021   CREATININE 1.07 (H) 11/01/2021   CALCIUM 9.2 11/01/2021   MG 1.7 11/01/2021   Lab Results  Component Value Date   INR 0.93 12/24/2016   Lab Results  Component Value Date   CHOL 128 10/31/2021   HDL 61 10/31/2021   LDLCALC 56 10/31/2021   TRIG 57 10/31/2021     GEN- The patient is a well appearing elderly female, alert and oriented x 3 today.   HEENT-head normocephalic, atraumatic, sclera clear, conjunctiva pink, hearing intact, trachea midline. Lungs- Clear to ausculation bilaterally, normal work of breathing Heart- Regular rate and rhythm, no murmurs, rubs or gallops  GI- soft, NT, ND, + BS Extremities- no clubbing, cyanosis, or edema MS- no significant deformity or atrophy Skin- no rash or lesion Psych- euthymic mood, full affect Neuro- strength and sensation are intact   EKG-  Coarse afib vs atypical atrial flutter with variable block Vent. rate 102 BPM PR interval 178 ms QRS duration 78 ms QT/QTcB 308/401 ms   Echo- 10/31/21  1. No significant pericardial effusion.   2. Left ventricular ejection fraction, by estimation, is 60 to 65%. The left ventricle has normal function. The left ventricle has no regional wall motion abnormalities. Left ventricular diastolic parameters are indeterminate.   3. Right ventricular systolic function is normal. The right ventricular  size is normal.   4. Left atrial size was mildly dilated.   5. The mitral valve is normal in structure. Mild mitral valve  regurgitation. No evidence of mitral stenosis.   6. The aortic valve is normal in structure. Aortic valve regurgitation is  not visualized. No aortic stenosis is present.   7. The inferior vena cava  is normal in size with greater than 50% respiratory variability, suggesting right atrial pressure of 3 mmHg.    Assessment and Plan:  1. Persistent Afib Failed Multaq, now on amiodarone S/p afib ablation 10/28/21 Patient back in afib today, question if she is paroxysmal. Instructed patient to keep check on heart rate at home, if persistently elevated may need DCCV.   Continue Toprol 50 mg daily Continue amiodarone 200 mg daily. Continue Xarelto 20 mg with no missed doses for 3 months post ablation. Recheck bmet/cbc today.  2. CHA2DS2VASc  score of 4 Continue with xarelto 20 mg daily   3. HTN Elevated today, will reassess in SR.  4. CAD No anginal symptoms.   Follow up in the AF clinic as scheduled.    [facility] [address] [place], [place] [address] [phone]

## 2021-11-14 ENCOUNTER — Telehealth (HOSPITAL_COMMUNITY): Payer: Self-pay | Admitting: *Deleted

## 2021-11-14 NOTE — Telephone Encounter (Signed)
Patient called in stating since appt HR in the 60s at home. Does complain of sore throat/chest tightness - encouraged follow up with PCP as WBC was elevated on recent blood work. Pt will contact PCP

## 2021-11-25 ENCOUNTER — Ambulatory Visit (HOSPITAL_COMMUNITY)
Admission: RE | Admit: 2021-11-25 | Discharge: 2021-11-25 | Disposition: A | Discharge status: Home / Self Care | Payer: Medicare Other | Source: Ambulatory Visit | Attending: Physician Assistant | Admitting: Physician Assistant

## 2021-11-25 ENCOUNTER — Encounter (HOSPITAL_COMMUNITY): Payer: Self-pay | Admitting: Physician Assistant

## 2021-11-25 VITALS — BP 150/80 | HR 55 | Ht 65.0 in | Wt 156.4 lb

## 2021-11-25 DIAGNOSIS — I2510 Atherosclerotic heart disease of native coronary artery without angina pectoris: Secondary | ICD-10-CM | POA: Diagnosis not present

## 2021-11-25 DIAGNOSIS — D6869 Other thrombophilia: Secondary | ICD-10-CM

## 2021-11-25 DIAGNOSIS — I10 Essential (primary) hypertension: Secondary | ICD-10-CM | POA: Diagnosis not present

## 2021-11-25 DIAGNOSIS — I4819 Other persistent atrial fibrillation: Secondary | ICD-10-CM | POA: Insufficient documentation

## 2021-11-25 NOTE — Progress Notes (Signed)
Primary Care Physician: Pablo Lawrence, NP Referring Physician: Dr. Domenic Polite Primary EP: Dr Rocky Crafts is a 81 y.o. female with a h/o CAD, HTN,  persistent afib, most recent cardioversion with ERAF after starting Multaq. She also had successful  cardioversion earlier in January with ERAF as well. Dr, Domenic Polite referred her to the afib clinic to discuss Tikosyn. She has some lung issues at baseline, including pulmonary HTN so he did not want to use amiodarone. She feels tired in afib but is rate controlled and tolerating ok.  Patient seen by Dr Quentin Ore 07/24/21 and started on amiodarone as a bridge to ablation. She is scheduled for ablation on 10/28/21.   Patient is s/p DCCV 08/13/21. Her Toprol was held for bradycardia afterwards. Unfortunately, she was back in rapid afib the next morning with palpitations and fatigue. She is in afib today. No bleeding issues with anticoagulation.   Follow up in the AF clinic 11/11/21. Patient is s/p afib ablation 10/28/21 with Dr Quentin Ore. She presented to the ED with CP, SOB, and fluid overload, felt to be 2/2 intraprocedral IVF. She was diuresed with improvement in her SOB. She was noted to be going in and out of afib on telemetry. She was given ibuprofen and colchicine for her CP. Patient reports that she felt well post discharge until this past weekend when she had heart racing. She is out of rhythm today.   Follow up in the AF clinic 11/25/21. Patient reports that she feels improved since her last visit. She is staying in SR more and is less SOB with more energy. She does have some upper respiratory congestion and has a visit next week with her PCP. No bleeding issues on anticoagulation.   Today, she denies symptoms of palpitations, chest pain, shortness of breath, orthopnea, PND, lower extremity edema,  presyncope, syncope, or neurologic sequela. The patient is tolerating medications without difficulties and is otherwise without complaint today.    Past Medical History:  Diagnosis Date   Anemia    Arthritis    Breast cancer ([place])    Right mastectomy   Chronic back pain    Coronary atherosclerosis of native coronary artery    a. 12/2019 Cath: LM nl, LAD 20m LCX 50p, 80d, RCA 20p, 447m>med rx.   Demand ischemia ([place]   a. 02/2021 HsTrop to 361 in setting of rapid afib; b. 02/2021 Echo: EF 65-70%, no rwma, mild LVH, RVSP 28.22m18m. Mildly to mod dil LA. Triv MR. Ao sclerosis w/o stenosis.   Environmental allergies    Essential hypertension    GERD (gastroesophageal reflux disease)    History of bronchitis    History of colon polyps    History of diverticulosis    History of migraine    Panic attacks    Persistent [place] ([place]   a. CHA2DS2VASc = 5-->xarelto.   Prolapse of vaginal vault after hysterectomy    Seasonal allergies    Urinary frequency    Past Surgical History:  Procedure Laterality Date   ABDOMINAL HYSTERECTOMY     ATRIAL FIBRILLATION ABLATION N/A 10/28/2021   Procedure: ATRIAL FIBRILLATION ABLATION;  Surgeon: LamVickie EpleyD;  [facility];  Service: Cardiovascular;  Laterality: N/A;   BACK SURGERY  1981/2011/2014   BLADDER SUSPENSION     BREAST RECONSTRUCTION  2004   with abd tissue   BREAST SURGERY Right    CARDIOVERSION N/A 04/30/2021   Procedure: CARDIOVERSION;  Surgeon: BraArnoldo Lenis  MD;  [facility]: [facility];  Service: Endoscopy;  Laterality: N/A;   CARDIOVERSION N/A 05/16/2021   Procedure: CARDIOVERSION;  Surgeon: Arnoldo Lenis, MD;  [facility]: [facility];  Service: Endoscopy;  Laterality: N/A;   CARDIOVERSION N/A 08/13/2021   Procedure: CARDIOVERSION;  Surgeon: Donato Heinz, MD;  [facility];  Service: Cardiovascular;  Laterality: N/A;   CHOLECYSTECTOMY     COLONOSCOPY  12/2009   RMR: few pancolonic diverticula. next TCS 12/2014   COLONOSCOPY  06/14/2007   PTW:[id number] rectal polyp, status post cold biopsy removal/ Anal canal  hemorrhoids/Left-sided diverticula Colonic mucosa appeared normal.Hyperplastic polyp.    COLONOSCOPY  01/18/2004   GYF:VCBSWHQ diverticula/Internal hemorrhoids.  Otherwise normal rectum   COLONOSCOPY N/A 04/18/2013   Dr. Gala Romney- normal rectum, scattered left sided diverticula the remainder of the colonic mucosa appeared normal   COLONOSCOPY N/A 08/18/2014   Procedure: COLONOSCOPY;  Surgeon: Daneil Dolin, MD;  [facility];  Service: Endoscopy;  Laterality: N/A;  1000   ESOPHAGOGASTRODUODENOSCOPY  06/14/2007   PRF:[id number] plaques in esophageal mucosa of uncertain significance brushed and biopsied/Normal stomach, normal first duodenum and second duodenoscopy. KOH negative. esophageal bx c/w GERD   ESOPHAGOGASTRODUODENOSCOPY N/A 08/18/2014   Procedure: ESOPHAGOGASTRODUODENOSCOPY (EGD);  Surgeon: Daneil Dolin, MD;  [facility];  Service: Endoscopy;  Laterality: N/A;   ESOPHAGOGASTRODUODENOSCOPY (EGD) WITH ESOPHAGEAL DILATION N/A 04/18/2013   Dr. Gala Romney- normal, patent appearing, tubular esophagus, normal gastric mucosa, paptent pylorus, normal first and second portion of the duodenum   EYE SURGERY     lasik   FOOT SURGERY Right 2014   d/t plantar fascitis   GIVENS CAPSULE STUDY N/A 07/18/2013   L.Lewis PAC- markedly abnormal appearing gastric mucosa, sugnificant bile reflux, questionable subucosal small bowel mass versus extrinsic compression in the distal small bowel.- CTE= no small bowel mass or tumor seen.   LEFT HEART CATH AND CORONARY ANGIOGRAPHY N/A 01/16/2020   Procedure: LEFT HEART CATH AND CORONARY ANGIOGRAPHY;  Surgeon: Burnell Blanks, MD;  Location: [facility];  Service: Cardiovascular;  Laterality: N/A;   MALONEY DILATION N/A 08/18/2014   Procedure: Venia Minks DILATION;  Surgeon: Daneil Dolin, MD;  [facility];  Service: Endoscopy;  Laterality: N/A;   MASTECTOMY     right side 12-13 years ago   MOUTH SURGERY     cyst removed  in June 2022.   TOTAL HIP ARTHROPLASTY Left 02/29/2016   Procedure: TOTAL HIP ARTHROPLASTY ANTERIOR APPROACH;  Surgeon: Frederik Pear, MD;  Location: [place];  Service: Orthopedics;  Laterality: Left;   TOTAL HIP ARTHROPLASTY Right 01/05/2017   Procedure: TOTAL HIP ARTHROPLASTY ANTERIOR APPROACH;  Surgeon: Frederik Pear, MD;  Location: [facility];  Service: Orthopedics;  Laterality: Right;  REQUEST 90 MINS    Current Outpatient Medications  Medication Sig Dispense Refill   albuterol (VENTOLIN HFA) 108 (90 Base) MCG/ACT inhaler Inhale 1-2 puffs into the lungs every 6 (six) hours as needed for shortness of breath or wheezing.     amiodarone (PACERONE) 200 MG tablet 1 tablet by mouth (200 mg total) daily.     Camphor-Menthol-Methyl Sal (SALONPAS) 3.05-03-08 % PTCH Apply 1 patch topically at bedtime as needed (pain).     diphenhydrAMINE (BENADRYL) 25 MG tablet Take 25 mg by mouth daily as needed for allergies.     docusate sodium (COLACE) 100 MG capsule Take 100 mg by mouth daily as needed for mild constipation.     HYDROcodone-acetaminophen (NORCO/VICODIN) 5-325 MG tablet Take  1 tablet by mouth every 8 (eight) hours as needed for moderate pain (back pain.).     levothyroxine (SYNTHROID) 25 MCG tablet Take 25 mcg by mouth daily before breakfast.     metoprolol succinate (TOPROL XL) 100 MG 24 hr tablet Take 0.5 daily with or immediately following a meal. 30 tablet 11   nitroGLYCERIN (NITROSTAT) 0.4 MG SL tablet Place 0.4 mg under the tongue every 5 (five) minutes as needed for chest pain.     pantoprazole (PROTONIX) 40 MG tablet Take 1 tablet (40 mg total) by mouth daily. 45 tablet 0   rivaroxaban (XARELTO) 20 MG TABS tablet Take 1 tablet (20 mg total) by mouth daily with supper. 30 tablet 3   rosuvastatin (CRESTOR) 5 MG tablet TAKE ONE TABLET BY MOUTH DAILY 90 tablet 1   No current facility-administered medications for this encounter.    Allergies  Allergen Reactions   Penicillins Anaphylaxis, Swelling  and Other (See Comments)    Blisters Has patient had a PCN reaction causing immediate rash, facial/tongue/throat swelling, SOB or lightheadedness with hypotension: Yes Has patient had a PCN reaction causing severe rash involving mucus membranes or skin necrosis: No Has patient had a PCN reaction that required hospitalization No Has patient had a PCN reaction occurring within the last 10 years: No If all of the above answers are "NO", then may proceed with Cephalosporin use.    Eliquis [Apixaban] Hives    Social History   Socioeconomic History   Marital status: Widowed    Spouse name: [name]   Number of children: 4   Years of education: 12th   Highest education level: Not on file  Occupational History    Employer: RETIRED  Tobacco Use   Smoking status: Former    Packs/day: 1.50    Years: 30.00    Total pack years: 45.00    Types: Cigarettes    Quit date: 04/28/2000    Years since quitting: 21.5   Smokeless tobacco: Never   Tobacco comments:    Former smoker 07/30/21  Vaping Use   Vaping Use: Never used  Substance and Sexual Activity   Alcohol use: No   Drug use: No   Sexual activity: Not on file    Comment: hyst  Other Topics Concern   Not on file  Social History Narrative   Patient lives at home with spouse.   Caffeine Use: 2 cups of coffee daily   Social Determinants of Health   Financial Resource Strain: Not on file  Food Insecurity: Not on file  Transportation Needs: Not on file  Physical Activity: Not on file  Stress: Not on file  Social Connections: Not on file  Intimate Partner Violence: Not on file    Family History  Problem Relation Age of Onset   Coronary artery disease Mother    Coronary artery disease Father    Cancer Sister        lung cancer, two sisters   Lung cancer Sister    Coronary artery disease Brother    Colon cancer Brother        less than age 55   Cancer Brother    Emphysema Brother    Diabetes Son    Healthy Son    Healthy  Son    Healthy Son    Healthy Son     ROS- All systems are reviewed and negative except as per the HPI above  Physical Exam: Vitals:   11/25/21 1042  BP: (!) 150/80  Pulse: (!) 55  Weight: 70.9 kg  Height: '5\' 5"'$  (1.651 m)     Wt Readings from Last 3 Encounters:  11/25/21 70.9 kg  11/11/21 70.4 kg  11/01/21 69.1 kg    Labs: Lab Results  Component Value Date   NA 138 11/11/2021   K 4.7 11/11/2021   CL 101 11/11/2021   CO2 26 11/11/2021   GLUCOSE 104 (H) 11/11/2021   BUN 21 11/11/2021   CREATININE 1.34 (H) 11/11/2021   CALCIUM 10.1 11/11/2021   MG 1.7 11/01/2021   Lab Results  Component Value Date   INR 0.93 12/24/2016   Lab Results  Component Value Date   CHOL 128 10/31/2021   HDL 61 10/31/2021   LDLCALC 56 10/31/2021   TRIG 57 10/31/2021     GEN- The patient is a well appearing elderly female, alert and oriented x 3 today.   HEENT-head normocephalic, atraumatic, sclera clear, conjunctiva pink, hearing intact, trachea midline. Lungs- Clear to ausculation bilaterally, normal work of breathing Heart- Regular rate and rhythm, no murmurs, rubs or gallops  GI- soft, NT, ND, + BS Extremities- no clubbing, cyanosis, or edema MS- no significant deformity or atrophy Skin- no rash or lesion Psych- euthymic mood, full affect Neuro- strength and sensation are intact   EKG-  SB Vent. rate 55 BPM PR interval 196 ms QRS duration 82 ms QT/QTcB 446/426 ms   Echo- 10/31/21  1. No significant pericardial effusion.   2. Left ventricular ejection fraction, by estimation, is 60 to 65%. The left ventricle has normal function. The left ventricle has no regional wall motion abnormalities. Left ventricular diastolic parameters are indeterminate.   3. Right ventricular systolic function is normal. The right ventricular  size is normal.   4. Left atrial size was mildly dilated.   5. The mitral valve is normal in structure. Mild mitral valve  regurgitation. No evidence of  mitral stenosis.   6. The aortic valve is normal in structure. Aortic valve regurgitation is  not visualized. No aortic stenosis is present.   7. The inferior vena cava is normal in size with greater than 50% respiratory variability, suggesting right atrial pressure of 3 mmHg.    Assessment and Plan:  1. Persistent Afib Failed Multaq, now on amiodarone S/p afib ablation 10/28/21 Patient in [place] today.  Continue Toprol 50 mg daily Continue amiodarone 200 mg daily. Continue Xarelto 20 mg with no missed doses for 3 months post ablation.  2. CHA2DS2VASc  score of 5 Continue with xarelto 20 mg daily   3. HTN Improved since last visit No changes today.  4. CAD No anginal symptoms.   Follow up with Dr Domenic Polite and Dr Quentin Ore as scheduled.    [facility] [address] [place], [place] [address] [phone]

## 2021-12-15 NOTE — Progress Notes (Unsigned)
Cardiology Office Note  Date: 12/16/2021   ID: Aava, Deland [birth year], MRN [id number]  PCP:  Pablo Lawrence, NP  Cardiologist:  Rozann Lesches, MD Electrophysiologist:  Vickie Epley, MD   Chief Complaint  Patient presents with   Cardiac follow-up    History of Present Illness: [name] is an 81 y.o. female last seen in May and more recently in the atrial fibrillation clinic in July, I reviewed the note.  She is status post atrial fibrillation ablation on July 3 with Dr. Quentin Ore.  She has remained on amiodarone following the procedure for the time being, hopefully as breakthrough atrial fibrillation settles down.  She was in sinus rhythm as of her last tracing on July 31 and is in sinus rhythm today as well.  She tells me that she does feel better in terms of stamina, generally NYHA class II dyspnea and no angina.  Blood pressure was elevated today, rechecked by me at 150/82.  She had previously been on Norvasc, this was switched to Cardizem CD along the way in treatment for her atrial fibrillation, she is now just on Toprol-XL with heart rate in the 50s in sinus rhythm.  We discussed initiation of low-dose Cozaar for now.  I personally reviewed her ECG today which shows sinus bradycardia, normal QTc.  Past Medical History:  Diagnosis Date   Anemia    Arthritis    Breast cancer ( [place])    Right mastectomy   Chronic back pain    Coronary atherosclerosis of native coronary artery    a. 12/2019 Cath: LM nl, LAD 80m LCX 50p, 80d, RCA 20p, 430m>med rx.   Demand [place] ([facility]   a. 02/2021 HsTrop to 361 in setting of rapid afib; b. 02/2021 Echo: EF 65-70%, no rwma, mild LVH, RVSP 28.97m32m. Mildly to mod dil LA. Triv MR. Ao sclerosis w/o stenosis.   Environmental allergies    Essential hypertension    GERD (gastroesophageal reflux disease)    History of bronchitis    History of colon polyps    History of diverticulosis    History of migraine    Panic attacks     Persistent [place] ([place]   a. CHA2DS2VASc = 5-->xarelto.   Prolapse of vaginal vault after hysterectomy    Seasonal allergies    Urinary frequency     Past Surgical History:  Procedure Laterality Date   ABDOMINAL HYSTERECTOMY     ATRIAL FIBRILLATION ABLATION N/A 10/28/2021   Procedure: ATRIAL FIBRILLATION ABLATION;  Surgeon: LamVickie EpleyD;  [facility];  Service: Cardiovascular;  Laterality: N/A;   BACK SURGERY  1981/2011/2014   BLADDER SUSPENSION     BREAST RECONSTRUCTION  2004   with abd tissue   BREAST SURGERY Right    CARDIOVERSION N/A 04/30/2021   Procedure: CARDIOVERSION;  Surgeon: BraArnoldo LenisD;  [facility];  Service: Endoscopy;  Laterality: N/A;   CARDIOVERSION N/A 05/16/2021   Procedure: CARDIOVERSION;  Surgeon: BraArnoldo LenisD;  [facility];  Service: Endoscopy;  Laterality: N/A;   CARDIOVERSION N/A 08/13/2021   Procedure: CARDIOVERSION;  Surgeon: SchDonato HeinzD;  [facility];  Service: Cardiovascular;  Laterality: N/A;   CHOLECYSTECTOMY     COLONOSCOPY  12/2009   RMR: few pancolonic diverticula. next TCS 12/2014   COLONOSCOPY  06/14/2007   RMROFB:PZWCHENIDPctal polyp, status post cold biopsy removal/ Anal canal hemorrhoids/Left-sided diverticula Colonic mucosa appeared normal.Hyperplastic polyp.  COLONOSCOPY  01/18/2004   RDE:YCXKGYJ diverticula/Internal hemorrhoids.  Otherwise normal rectum   COLONOSCOPY N/A 04/18/2013   Dr. Gala Romney- normal rectum, scattered left sided diverticula the remainder of the colonic mucosa appeared normal   COLONOSCOPY N/A 08/18/2014   Procedure: COLONOSCOPY;  Surgeon: Daneil Dolin, MD;  [facility];  Service: Endoscopy;  Laterality: N/A;  1000   ESOPHAGOGASTRODUODENOSCOPY  06/14/2007   EHU:[id number] plaques in esophageal mucosa of uncertain significance brushed and biopsied/Normal stomach, normal first duodenum and second  duodenoscopy. KOH negative. esophageal bx c/w GERD   ESOPHAGOGASTRODUODENOSCOPY N/A 08/18/2014   Procedure: ESOPHAGOGASTRODUODENOSCOPY (EGD);  Surgeon: Daneil Dolin, MD;  [facility];  Service: Endoscopy;  Laterality: N/A;   ESOPHAGOGASTRODUODENOSCOPY (EGD) WITH ESOPHAGEAL DILATION N/A 04/18/2013   Dr. Gala Romney- normal, patent appearing, tubular esophagus, normal gastric mucosa, paptent pylorus, normal first and second portion of the duodenum   EYE SURGERY     lasik   FOOT SURGERY Right 2014   d/t plantar fascitis   GIVENS CAPSULE STUDY N/A 07/18/2013   L.Lewis PAC- markedly abnormal appearing gastric mucosa, sugnificant bile reflux, questionable subucosal small bowel mass versus extrinsic compression in the distal small bowel.- CTE= no small bowel mass or tumor seen.   LEFT HEART CATH AND CORONARY ANGIOGRAPHY N/A 01/16/2020   Procedure: LEFT HEART CATH AND CORONARY ANGIOGRAPHY;  Surgeon: Burnell Blanks, MD;  Location: [place] [facility];  Service: Cardiovascular;  Laterality: N/A;   MALONEY DILATION N/A 08/18/2014   Procedure: Venia Minks DILATION;  Surgeon: Daneil Dolin, MD;  [facility];  Service: Endoscopy;  Laterality: N/A;   MASTECTOMY     right side 12-13 years ago   MOUTH SURGERY     cyst removed in June 2022.   TOTAL HIP ARTHROPLASTY Left 02/29/2016   Procedure: TOTAL HIP ARTHROPLASTY ANTERIOR APPROACH;  Surgeon: Frederik Pear, MD;  Location: [place];  Service: Orthopedics;  Laterality: Left;   TOTAL HIP ARTHROPLASTY Right 01/05/2017   Procedure: TOTAL HIP ARTHROPLASTY ANTERIOR APPROACH;  Surgeon: Frederik Pear, MD;  Location: [place];  Service: Orthopedics;  Laterality: Right;  REQUEST 90 MINS    Current Outpatient Medications  Medication Sig Dispense Refill   albuterol (VENTOLIN HFA) 108 (90 Base) MCG/ACT inhaler Inhale 1-2 puffs into the lungs every 6 (six) hours as needed for shortness of breath or wheezing.     amiodarone (PACERONE) 200 MG tablet 1  tablet by mouth (200 mg total) daily.     Camphor-Menthol-Methyl Sal (SALONPAS) 3.05-03-08 % PTCH Apply 1 patch topically at bedtime as needed (pain).     diphenhydrAMINE (BENADRYL) 25 MG tablet Take 25 mg by mouth daily as needed for allergies.     docusate sodium (COLACE) 100 MG capsule Take 100 mg by mouth daily as needed for mild constipation.     HYDROcodone-acetaminophen (NORCO/VICODIN) 5-325 MG tablet Take 1 tablet by mouth every 8 (eight) hours as needed for moderate pain (back pain.).     levothyroxine (SYNTHROID) 25 MCG tablet Take 25 mcg by mouth daily before breakfast.     losartan (COZAAR) 25 MG tablet Take 0.5 tablets (12.5 mg total) by mouth daily. 45 tablet 1   metoprolol succinate (TOPROL XL) 100 MG 24 hr tablet Take 0.5 daily with or immediately following a meal. 30 tablet 11   nitroGLYCERIN (NITROSTAT) 0.4 MG SL tablet Place 0.4 mg under the tongue every 5 (five) minutes as needed for chest pain.     omeprazole (PRILOSEC) 20 MG  capsule Take 20 mg by mouth daily.     rivaroxaban (XARELTO) 20 MG TABS tablet Take 1 tablet (20 mg total) by mouth daily with supper. 30 tablet 3   rosuvastatin (CRESTOR) 5 MG tablet TAKE ONE TABLET BY MOUTH DAILY 90 tablet 1   No current facility-administered medications for this visit.   Allergies:  Penicillins and Eliquis [apixaban]   ROS: No syncope.  Physical Exam: VS:  BP (!) 150/82 (BP Location: Left Arm, Cuff Size: Normal)   Pulse (!) 54   Ht 5' 5.5" (1.664 m)   Wt 156 lb 9.6 oz (71 kg)   SpO2 96%   BMI 25.66 kg/m , BMI Body mass index is 25.66 kg/m.  Wt Readings from Last 3 Encounters:  12/16/21 156 lb 9.6 oz (71 kg)  11/25/21 156 lb 6.4 oz (70.9 kg)  11/11/21 155 lb 3.2 oz (70.4 kg)    General: Patient appears comfortable at rest. HEENT: Conjunctiva and lids normal. Neck: Supple, no elevated JVP or carotid bruits, no thyromegaly. Lungs: Clear to auscultation, nonlabored breathing at rest. Cardiac: Regular rate and rhythm, no  S3, 1/6 systolic murmur. Extremities: No pitting edema.  ECG:  An ECG dated 11/25/2021 was personally reviewed today and demonstrated:  Sinus bradycardia with poor R wave progression rule out old anterior infarct pattern.  Recent Labwork: 01/11/2021: ALT 10; AST 24 10/30/2021: B Natriuretic Peptide 435.0 10/31/2021: TSH 1.718 11/01/2021: Magnesium 1.7 11/11/2021: BUN 21; Creatinine, Ser 1.34; Hemoglobin 11.9; Platelets 321; Potassium 4.7; Sodium 138     Component Value Date/Time   CHOL 128 10/31/2021 0100   TRIG 57 10/31/2021 0100   HDL 61 10/31/2021 0100   CHOLHDL 2.1 10/31/2021 0100   VLDL 11 10/31/2021 0100   LDLCALC 56 10/31/2021 0100    Other Studies Reviewed Today:  Echocardiogram 10/31/2021:  1. No significant pericardial effusion.   2. Left ventricular ejection fraction, by estimation, is 60 to 65%. The  left ventricle has normal function. The left ventricle has no regional  wall motion abnormalities. Left ventricular diastolic parameters are  indeterminate.   3. Right ventricular systolic function is normal. The right ventricular  size is normal.   4. Left atrial size was mildly dilated.   5. The mitral valve is normal in structure. Mild mitral valve  regurgitation. No evidence of mitral stenosis.   6. The aortic valve is normal in structure. Aortic valve regurgitation is  not visualized. No aortic stenosis is present.   7. The inferior vena cava is normal in size with greater than 50%  respiratory variability, suggesting right atrial pressure of 3 mmHg.   Assessment and Plan:  1.  Persistent atrial fibrillation with CHA2DS2-VASc score of 4.  She is status post atrial fibrillation ablation in July and has follow-up with Dr. Quentin Ore pending.  She is in sinus rhythm today with heart rate in the low 50s on Toprol-XL and amiodarone which continues at low-dose for now.  Continue Xarelto for stroke prophylaxis.  I reviewed her most recent lab work.  Anticipate discontinuation of  amiodarone when she sees Dr. Quentin Ore presuming rhythm remains stable.  2.  Essential hypertension, start Cozaar 12.5 mg daily, check BMET in 2 weeks.  3.  CAD, mild to moderate with exception of approximately 80% stenosis in the distal circumflex which is small caliber and managed medically.  Currently on Toprol-XL and Crestor.  Medication Adjustments/Labs and Tests Ordered: Current medicines are reviewed at length with the patient today.  Concerns regarding medicines are  outlined above.   Tests Ordered: Orders Placed This Encounter  Procedures   Basic metabolic panel    Medication Changes: Meds ordered this encounter  Medications   losartan (COZAAR) 25 MG tablet    Sig: Take 0.5 tablets (12.5 mg total) by mouth daily.    Dispense:  45 tablet    Refill:  1    12/16/2021 NEW    Disposition:  Follow up  6 months.  Signed, Satira Sark, MD, [facility] 12/16/2021 9:16 AM    [facility] [place], [place], [place] [address] Phone: [phone]; Fax: [phone]

## 2021-12-16 ENCOUNTER — Ambulatory Visit: Payer: Medicare Other | Admitting: Cardiology

## 2021-12-16 ENCOUNTER — Encounter: Payer: Self-pay | Admitting: Cardiology

## 2021-12-16 VITALS — BP 150/82 | HR 54 | Ht 65.5 in | Wt 156.6 lb

## 2021-12-16 DIAGNOSIS — I4819 Other persistent atrial fibrillation: Secondary | ICD-10-CM | POA: Diagnosis not present

## 2021-12-16 DIAGNOSIS — I25119 Atherosclerotic heart disease of native coronary artery with unspecified angina pectoris: Secondary | ICD-10-CM | POA: Diagnosis not present

## 2021-12-16 DIAGNOSIS — Z79899 Other long term (current) drug therapy: Secondary | ICD-10-CM

## 2021-12-16 DIAGNOSIS — I10 Essential (primary) hypertension: Secondary | ICD-10-CM | POA: Diagnosis not present

## 2021-12-16 MED ORDER — LOSARTAN POTASSIUM 25 MG PO TABS: 12.5000 mg | ORAL_TABLET | Freq: Every day | ORAL | 1 refills | Status: DC

## 2021-12-16 NOTE — Addendum Note (Signed)
Addended by: Merlene Laughter on: 12/16/2021 09:40 AM   Modules accepted: Orders

## 2021-12-16 NOTE — Patient Instructions (Addendum)
Medication Instructions:  Your physician has recommended you make the following change in your medication:  Start losartan 12.5 mg daily Continue other medications the same  Labwork: BMET in 2 weeks around 12/30/2021 Non-fasting [facility] or [employer] or [employer] in [place]  Testing/Procedures: none  Follow-Up: Your physician recommends that you schedule a follow-up appointment in: 6 months  Any Other Special Instructions Will Be Listed Below (If Applicable).  If you need a refill on your cardiac medications before your next appointment, please call your pharmacy.

## 2021-12-27 LAB — BASIC METABOLIC PANEL
BUN/Creatinine Ratio: 17 (ref 12–28)
BUN: 23 mg/dL (ref 8–27)
CO2: 23 mmol/L (ref 20–29)
Calcium: 9.7 mg/dL (ref 8.7–10.3)
Chloride: 100 mmol/L (ref 96–106)
Creatinine, Ser: 1.35 mg/dL — ABNORMAL HIGH (ref 0.57–1.00)
Glucose: 95 mg/dL (ref 70–99)
Potassium: 4.4 mmol/L (ref 3.5–5.2)
Sodium: 138 mmol/L (ref 134–144)
eGFR: 39 mL/min/{1.73_m2} — ABNORMAL LOW (ref >59)

## 2022-01-31 ENCOUNTER — Ambulatory Visit: Payer: Medicare Other | Admitting: Cardiology

## 2022-02-26 ENCOUNTER — Ambulatory Visit: Payer: Medicare Other | Admitting: Cardiology

## 2022-03-03 ENCOUNTER — Other Ambulatory Visit: Payer: Self-pay | Admitting: Cardiology

## 2022-03-26 ENCOUNTER — Ambulatory Visit: Payer: Medicare Other | Admitting: Cardiology

## 2022-03-31 ENCOUNTER — Other Ambulatory Visit: Payer: Self-pay | Admitting: Cardiology

## 2022-06-05 ENCOUNTER — Encounter (HOSPITAL_COMMUNITY): Payer: Self-pay | Admitting: *Deleted

## 2022-06-06 ENCOUNTER — Encounter: Payer: Self-pay | Admitting: Cardiology

## 2022-06-06 ENCOUNTER — Ambulatory Visit: Payer: Medicare Other | Attending: Cardiology | Admitting: Cardiology

## 2022-06-06 VITALS — BP 160/74 | HR 61 | Ht 65.5 in | Wt 160.8 lb

## 2022-06-06 DIAGNOSIS — I4819 Other persistent atrial fibrillation: Secondary | ICD-10-CM | POA: Diagnosis not present

## 2022-06-06 DIAGNOSIS — Z79899 Other long term (current) drug therapy: Secondary | ICD-10-CM

## 2022-06-06 DIAGNOSIS — I10 Essential (primary) hypertension: Secondary | ICD-10-CM | POA: Diagnosis not present

## 2022-06-06 MED ORDER — AMIODARONE HCL 100 MG PO TABS: 100.0000 mg | ORAL_TABLET | Freq: Every day | ORAL | 3 refills | Status: DC

## 2022-06-06 NOTE — Patient Instructions (Signed)
Medication Instructions:  Your physician has recommended you make the following change in your medication:  1) DECREASE amiodarone to 100 mg daily   *If you need a refill on your cardiac medications before your next appointment, please call your pharmacy*  Lab Work: TODAY: TSH, T4, CMET If you have labs (blood work) drawn today and your tests are completely normal, you will receive your results only by: [place] (if you have MyChart) OR A paper copy in the mail If you have any lab test that is abnormal or we need to change your treatment, we will call you to review the results.  Follow-Up: At [facility], you and your health needs are our priority.  As part of our continuing mission to provide you with exceptional heart care, we have created designated Provider Care Teams.  These Care Teams include your primary Cardiologist (physician) and Advanced Practice Providers (APPs -  Physician Assistants and Nurse Practitioners) who all work together to provide you with the care you need, when you need it.  Your next appointment:   6 month(s)  Provider:   You may see Vickie Epley, MD or one of the following Advanced Practice Providers on your designated Care Team:   Tommye Standard, [place] Beryle Beams" [place], [place], NP

## 2022-06-06 NOTE — Progress Notes (Deleted)
Electrophysiology Office Follow up Visit Note:    Date:  06/06/2022   ID:  [name], [name] 26-Jan-1941, MRN [id number]  PCP:  Pablo Lawrence, NP  [facility] Cardiologist:  Rozann Lesches, MD  [facility] Electrophysiologist:  Vickie Epley, MD    Interval History:    [name] is a 82 y.o. female who presents for a follow up visit.  She had an A-fib ablation on October 28, 2021.  During the ablation the veins and posterior wall were ablated.  She saw Dr. Domenic Polite in follow-up on December 16, 2021 and was maintaining normal rhythm.       Past Medical History:  Diagnosis Date   Anemia    Arthritis    Breast cancer ([place])    Right mastectomy   Chronic back pain    Coronary atherosclerosis of native coronary artery    a. 12/2019 Cath: LM nl, LAD 30m LCX 50p, 80d, RCA 20p, 448m>med rx.   Demand ischemia ([place]   a. 02/2021 HsTrop to 361 in setting of rapid afib; b. 02/2021 Echo: EF 65-70%, no rwma, mild LVH, RVSP 28.20m88m. Mildly to mod dil LA. Triv MR. Ao sclerosis w/o stenosis.   Environmental allergies    Essential hypertension    GERD (gastroesophageal reflux disease)    History of bronchitis    History of colon polyps    History of diverticulosis    History of migraine    Panic attacks    Persistent atrial fibrillation ([place]/2022   a. CHA2DS2VASc = 5-->xarelto.   Prolapse of vaginal vault after hysterectomy    Seasonal allergies    Urinary frequency     Past Surgical History:  Procedure Laterality Date   ABDOMINAL HYSTERECTOMY     ATRIAL FIBRILLATION ABLATION N/A 10/28/2021   Procedure: ATRIAL FIBRILLATION ABLATION;  Surgeon: LamVickie EpleyD;  [facility]: [facility] [place] [facility];  Service: Cardiovascular;  Laterality: N/A;   BACK SURGERY  1981/2011/2014   BLADDER SUSPENSION     BREAST RECONSTRUCTION  2004   with abd tissue   BREAST SURGERY Right    CARDIOVERSION N/A 04/30/2021   Procedure: CARDIOVERSION;  Surgeon: BraArnoldo LenisD;   [facility];  Service: Endoscopy;  Laterality: N/A;   CARDIOVERSION N/A 05/16/2021   Procedure: CARDIOVERSION;  Surgeon: BraArnoldo LenisD;  [facility];  Service: Endoscopy;  Laterality: N/A;   CARDIOVERSION N/A 08/13/2021   Procedure: CARDIOVERSION;  Surgeon: SchDonato HeinzD;  [facility];  Service: Cardiovascular;  Laterality: N/A;   CHOLECYSTECTOMY     COLONOSCOPY  12/2009   RMR: few pancolonic diverticula. next TCS 12/2014   COLONOSCOPY  06/14/2007   [id number]:[id number] polyp, status post cold biopsy removal/ Anal canal hemorrhoids/Left-sided diverticula Colonic mucosa appeared normal.Hyperplastic polyp.    COLONOSCOPY  01/18/2004   [id number]:[id number]/Internal hemorrhoids.  Otherwise normal rectum   COLONOSCOPY N/A 04/18/2013   Dr. RouGala Romneyormal rectum, scattered left sided diverticula the remainder of the colonic mucosa appeared normal   COLONOSCOPY N/A 08/18/2014   Procedure: COLONOSCOPY;  Surgeon: RobDaneil DolinD;  [facility];  Service: Endoscopy;  Laterality: N/A;  1000   ESOPHAGOGASTRODUODENOSCOPY  06/14/2007   RMRIN:[id number] in esophageal mucosa of uncertain significance brushed and biopsied/Normal stomach, normal first duodenum and second duodenoscopy. KOH negative. esophageal bx c/w GERD   ESOPHAGOGASTRODUODENOSCOPY N/A 08/18/2014   Procedure: ESOPHAGOGASTRODUODENOSCOPY (EGD);  Surgeon: RobDaneil DolinD;  [facility];  Service: Endoscopy;  Laterality: N/A;   ESOPHAGOGASTRODUODENOSCOPY (EGD) WITH ESOPHAGEAL DILATION N/A 04/18/2013   Dr. Gala Romney- normal, patent appearing, tubular esophagus, normal gastric mucosa, paptent pylorus, normal first and second portion of the duodenum   EYE SURGERY     lasik   FOOT SURGERY Right 2014   d/t plantar fascitis   GIVENS CAPSULE STUDY N/A 07/18/2013   L.Lewis PAC- markedly abnormal appearing gastric mucosa, sugnificant bile reflux, questionable subucosal small  bowel mass versus extrinsic compression in the distal small bowel.- CTE= no small bowel mass or tumor seen.   LEFT HEART CATH AND CORONARY ANGIOGRAPHY N/A 01/16/2020   Procedure: LEFT HEART CATH AND CORONARY ANGIOGRAPHY;  Surgeon: Burnell Blanks, MD;  Location: [place] [facility];  Service: Cardiovascular;  Laterality: N/A;   MALONEY DILATION N/A 08/18/2014   Procedure: Venia Minks DILATION;  Surgeon: Daneil Dolin, MD;  [facility];  Service: Endoscopy;  Laterality: N/A;   MASTECTOMY     right side 12-13 years ago   MOUTH SURGERY     cyst removed in June 2022.   TOTAL HIP ARTHROPLASTY Left 02/29/2016   Procedure: TOTAL HIP ARTHROPLASTY ANTERIOR APPROACH;  Surgeon: Frederik Pear, MD;  Location: [place];  Service: Orthopedics;  Laterality: Left;   TOTAL HIP ARTHROPLASTY Right 01/05/2017   Procedure: TOTAL HIP ARTHROPLASTY ANTERIOR APPROACH;  Surgeon: Frederik Pear, MD;  Location: [place];  Service: Orthopedics;  Laterality: Right;  REQUEST 90 MINS    Current Medications: No outpatient medications have been marked as taking for the 06/06/22 encounter (Appointment) with Vickie Epley, MD.     Allergies:   Penicillins and Eliquis [apixaban]   Social History   Socioeconomic History   Marital status: Widowed    Spouse name: [name]   Number of children: 4   Years of education: 12th   Highest education level: Not on file  Occupational History    Employer: RETIRED  Tobacco Use   Smoking status: Former    Packs/day: 1.50    Years: 30.00    Total pack years: 45.00    Types: Cigarettes    Quit date: 04/28/2000    Years since quitting: 22.1   Smokeless tobacco: Never   Tobacco comments:    Former smoker 07/30/21  Vaping Use   Vaping Use: Never used  Substance and Sexual Activity   Alcohol use: No   Drug use: No   Sexual activity: Not on file    Comment: hyst  Other Topics Concern   Not on file  Social History Narrative   Patient lives at home with spouse.    Caffeine Use: 2 cups of coffee daily   Social Determinants of Health   Financial Resource Strain: Not on file  Food Insecurity: Not on file  Transportation Needs: Not on file  Physical Activity: Not on file  Stress: Not on file  Social Connections: Not on file     Family History: The patient's family history includes Cancer in her brother and sister; Colon cancer in her brother; Coronary artery disease in her brother, father, and mother; Diabetes in her son; Emphysema in her brother; Healthy in her son, son, son, and son; Lung cancer in her sister.  ROS:   Please see the history of present illness.    All other systems reviewed and are negative.  EKGs/Labs/Other Studies Reviewed:    The following studies were reviewed today:   EKG:  The ekg ordered today demonstrates ***  Recent Labs: 10/30/2021:  B Natriuretic Peptide 435.0 10/31/2021: TSH 1.718 11/01/2021: Magnesium 1.7 11/11/2021: Hemoglobin 11.9; Platelets 321 12/26/2021: BUN 23; Creatinine, Ser 1.35; Potassium 4.4; Sodium 138  Recent Lipid Panel    Component Value Date/Time   CHOL 128 10/31/2021 0100   TRIG 57 10/31/2021 0100   HDL 61 10/31/2021 0100   CHOLHDL 2.1 10/31/2021 0100   VLDL 11 10/31/2021 0100   LDLCALC 56 10/31/2021 0100    Physical Exam:    VS:  There were no vitals taken for this visit.    Wt Readings from Last 3 Encounters:  12/16/21 156 lb 9.6 oz (71 kg)  11/25/21 156 lb 6.4 oz (70.9 kg)  11/11/21 155 lb 3.2 oz (70.4 kg)     GEN: *** Well nourished, well developed in no acute distress CARDIAC: ***RRR, no murmurs, rubs, gallops       ASSESSMENT:    No diagnosis found. PLAN:    In order of problems listed above:   #Persistent atrial fibrillation Maintaining sinus after her July catheter ablation. Continue metoprolol Reduce amiodarone to 100 mg by mouth once daily  Continue Xarelto  Check CMP, TSH and free T4 today   #Hypertension *** goal today.  Recommend checking blood  pressures 1-2 times per week at home and recording the values.  Recommend bringing these recordings to the primary care physician.   Follow-up with APP in 6 months.     Medication Adjustments/Labs and Tests Ordered: Current medicines are reviewed at length with the patient today.  Concerns regarding medicines are outlined above.  No orders of the defined types were placed in this encounter.  No orders of the defined types were placed in this encounter.    Signed, Lars Mage, MD, [facility], [facility] 06/06/2022 5:09 AM    Electrophysiology [facility]

## 2022-06-06 NOTE — Progress Notes (Signed)
Electrophysiology Office Follow up Visit Note:    Date:  06/06/2022   ID:  Melinda, Gould 05-17-1940, MRN [id number]  PCP:  Pablo Lawrence, NP  [facility] Cardiologist:  Rozann Lesches, MD  [facility] Electrophysiologist:  Vickie Epley, MD    Interval History:    [name] is a 82 y.o. female who presents for a follow up visit.  She had an A-fib ablation on October 28, 2021.  During the ablation the veins and posterior wall were ablated.  She saw Dr. Domenic Polite in follow-up on December 16, 2021 and was maintaining normal rhythm.  Today, she is feeling overall well. She has been compliant with xarelto. She denies any bleeding.   She has been monitoring her blood pressure and reports that her blood pressure is not usually high. She believes her high blood pressure today may be attributed to her high salt intake.   She denies any palpitations, chest pain, shortness of breath, or peripheral edema. No lightheadedness, headaches, syncope, orthopnea, or PND.  Past Medical History:  Diagnosis Date   Anemia    Arthritis    Breast cancer ([place])    Right mastectomy   Chronic back pain    Coronary atherosclerosis of native coronary artery    a. 12/2019 Cath: LM nl, LAD 274m LCX 50p, 80d, RCA 20p, 481m>med rx.   Demand ischemia    a. 02/2021 HsTrop to 361 in setting of rapid afib; b. 02/2021 Echo: EF 65-70%, no rwma, mild LVH, RVSP 28.74m88m. Mildly to mod dil LA. Triv MR. Ao sclerosis w/o stenosis.   Environmental allergies    Essential hypertension    GERD (gastroesophageal reflux disease)    History of bronchitis    History of colon polyps    History of diverticulosis    History of migraine    Panic attacks    Persistent atrial fibrillation ([place]CC[place]1/2022   a. CHA2DS2VASc = 5-->xarelto.   Prolapse of vaginal vault after hysterectomy    Seasonal allergies    Urinary frequency     Past Surgical History:  Procedure Laterality Date   ABDOMINAL HYSTERECTOMY     ATRIAL  FIBRILLATION ABLATION N/A 10/28/2021   Procedure: ATRIAL FIBRILLATION ABLATION;  Surgeon: LamVickie EpleyD;  [facility] [place] [facility];  Service: Cardiovascular;  Laterality: N/A;   BACK SURGERY  1981/2011/2014   BLADDER SUSPENSION     BREAST RECONSTRUCTION  2004   with abd tissue   BREAST SURGERY Right    CARDIOVERSION N/A 04/30/2021   Procedure: CARDIOVERSION;  Surgeon: BraArnoldo LenisD;  [facility];  Service: Endoscopy;  Laterality: N/A;   CARDIOVERSION N/A 05/16/2021   Procedure: CARDIOVERSION;  Surgeon: BraArnoldo LenisD;  [facility];  Service: Endoscopy;  Laterality: N/A;   CARDIOVERSION N/A 08/13/2021   Procedure: CARDIOVERSION;  Surgeon: SchDonato HeinzD;  [facility];  Service: Cardiovascular;  Laterality: N/A;   CHOLECYSTECTOMY     COLONOSCOPY  12/2009   RMR: few pancolonic diverticula. next TCS 12/2014   COLONOSCOPY  06/14/2007   [id number]:[id number] polyp, status post cold biopsy removal/ Anal canal hemorrhoids/Left-sided diverticula Colonic mucosa appeared normal.Hyperplastic polyp.    COLONOSCOPY  01/18/2004   [id number]:[id number]/Internal hemorrhoids.  Otherwise normal rectum   COLONOSCOPY N/A 04/18/2013   Dr. RouGala Romneyormal rectum, scattered left sided diverticula the remainder of the colonic mucosa appeared normal   COLONOSCOPY N/A 08/18/2014   Procedure: COLONOSCOPY;  Surgeon: RobDaneil DolinD;  [facility];  Service: Endoscopy;  Laterality: N/A;  1000   ESOPHAGOGASTRODUODENOSCOPY  06/14/2007   DA:[id number] plaques in esophageal mucosa of uncertain significance brushed and biopsied/Normal stomach, normal first duodenum and second duodenoscopy. KOH negative. esophageal bx c/w GERD   ESOPHAGOGASTRODUODENOSCOPY N/A 08/18/2014   Procedure: ESOPHAGOGASTRODUODENOSCOPY (EGD);  Surgeon: Daneil Dolin, MD;  [facility];  Service: Endoscopy;  Laterality: N/A;   ESOPHAGOGASTRODUODENOSCOPY (EGD) WITH  ESOPHAGEAL DILATION N/A 04/18/2013   Dr. Gala Romney- normal, patent appearing, tubular esophagus, normal gastric mucosa, paptent pylorus, normal first and second portion of the duodenum   EYE SURGERY     lasik   FOOT SURGERY Right 2014   d/t plantar fascitis   GIVENS CAPSULE STUDY N/A 07/18/2013   L.Lewis PAC- markedly abnormal appearing gastric mucosa, sugnificant bile reflux, questionable subucosal small bowel mass versus extrinsic compression in the distal small bowel.- CTE= no small bowel mass or tumor seen.   LEFT HEART CATH AND CORONARY ANGIOGRAPHY N/A 01/16/2020   Procedure: LEFT HEART CATH AND CORONARY ANGIOGRAPHY;  Surgeon: Burnell Blanks, MD;  Location: [place] [facility];  Service: Cardiovascular;  Laterality: N/A;   MALONEY DILATION N/A 08/18/2014   Procedure: Venia Minks DILATION;  Surgeon: Daneil Dolin, MD;  [facility];  Service: Endoscopy;  Laterality: N/A;   MASTECTOMY     right side 12-13 years ago   MOUTH SURGERY     cyst removed in June 2022.   TOTAL HIP ARTHROPLASTY Left 02/29/2016   Procedure: TOTAL HIP ARTHROPLASTY ANTERIOR APPROACH;  Surgeon: Frederik Pear, MD;  Location: [place];  Service: Orthopedics;  Laterality: Left;   TOTAL HIP ARTHROPLASTY Right 01/05/2017   Procedure: TOTAL HIP ARTHROPLASTY ANTERIOR APPROACH;  Surgeon: Frederik Pear, MD;  Location: [place];  Service: Orthopedics;  Laterality: Right;  REQUEST 90 MINS    Current Medications: No outpatient medications have been marked as taking for the 06/06/22 encounter (Office Visit) with Vickie Epley, MD.     Allergies:   Penicillins and Eliquis [apixaban]   Social History   Socioeconomic History   Marital status: Widowed    Spouse name: [name]   Number of children: 4   Years of education: 12th   Highest education level: Not on file  Occupational History    Employer: RETIRED  Tobacco Use   Smoking status: Former    Packs/day: 1.50    Years: 30.00    Total pack years: 45.00     Types: Cigarettes    Quit date: 04/28/2000    Years since quitting: 22.1   Smokeless tobacco: Never   Tobacco comments:    Former smoker 07/30/21  Vaping Use   Vaping Use: Never used  Substance and Sexual Activity   Alcohol use: No   Drug use: No   Sexual activity: Not on file    Comment: hyst  Other Topics Concern   Not on file  Social History Narrative   Patient lives at home with spouse.   Caffeine Use: 2 cups of coffee daily   Social Determinants of Health   Financial Resource Strain: Not on file  Food Insecurity: Not on file  Transportation Needs: Not on file  Physical Activity: Not on file  Stress: Not on file  Social Connections: Not on file     Family History: The patient's family history includes Cancer in her brother and sister; Colon cancer in her brother; Coronary artery disease in her brother, father, and mother; Diabetes in her  son; Emphysema in her brother; Healthy in her son, son, son, and son; Lung cancer in her sister.  ROS:   Please see the history of present illness.    All other systems reviewed and are negative.  EKGs/Labs/Other Studies Reviewed:    The following studies were reviewed today:   EKG:  The ekg ordered today demonstrates sinus rhythm  Recent Labs: 10/30/2021: B Natriuretic Peptide 435.0 10/31/2021: TSH 1.718 11/01/2021: Magnesium 1.7 11/11/2021: Hemoglobin 11.9; Platelets 321 12/26/2021: BUN 23; Creatinine, Ser 1.35; Potassium 4.4; Sodium 138  Recent Lipid Panel    Component Value Date/Time   CHOL 128 10/31/2021 0100   TRIG 57 10/31/2021 0100   HDL 61 10/31/2021 0100   CHOLHDL 2.1 10/31/2021 0100   VLDL 11 10/31/2021 0100   LDLCALC 56 10/31/2021 0100    Physical Exam:    VS:  BP (!) 160/74 (BP Location: Left Arm, Patient Position: Sitting)   Pulse 61   Ht 5' 5.5" (1.664 m)   Wt 160 lb 12.8 oz (72.9 kg)   SpO2 96%   BMI 26.35 kg/m     Wt Readings from Last 3 Encounters:  06/06/22 160 lb 12.8 oz (72.9 kg)  12/16/21 156 lb  9.6 oz (71 kg)  11/25/21 156 lb 6.4 oz (70.9 kg)     GEN:  Well nourished, well developed in no acute distress CARDIAC: RRR, no murmurs, rubs, gallops       ASSESSMENT:    1. Persistent atrial fibrillation ([place])   2. Primary hypertension   3. Encounter for long-term (current) use of high-risk medication    PLAN:    In order of problems listed above:   #Persistent atrial fibrillation Maintaining sinus after her July catheter ablation. Continue metoprolol Reduce amiodarone to 100 mg by mouth once daily  Continue Xarelto  Check CMP, TSH and free T4 today   #Hypertension Above goal today.  Recommend checking blood pressures 1-2 times per week at home and recording the values.  Recommend bringing these recordings to the primary care physician.  Discussed the DASH diet today.    Follow-up with APP in 6 months.    Medication Adjustments/Labs and Tests Ordered: Current medicines are reviewed at length with the patient today.  Concerns regarding medicines are outlined above.  No orders of the defined types were placed in this encounter.  No orders of the defined types were placed in this encounter.  I,Rachel Rivera,acting as a scribe for Vickie Epley, MD.,have documented all relevant documentation on the behalf of Vickie Epley, MD,as directed by  Vickie Epley, MD while in the presence of Vickie Epley, MD.  I, Vickie Epley, MD, have reviewed all documentation for this visit. The documentation on 06/06/22 for the exam, diagnosis, procedures, and orders are all accurate and complete.  Signed, Lars Mage, MD, [facility], [facility] 06/06/2022 12:22 PM    Electrophysiology [facility]

## 2022-06-25 NOTE — Progress Notes (Unsigned)
Cardiology Office Note  Date: 06/26/2022   ID: Janita, Croshaw [birth year], MRN [id number]  History of Present Illness: ZAILYN MINTZ is an 82 y.o. female last seen in October 2023.  She is here for a routine visit.  Reports no palpitations, no exertional chest pain or breathlessness beyond NYHA class II.  She has had no dizziness or syncope.  Interval follow-up noted with Dr. Quentin Ore recently in early February.  I reviewed her ECG as noted below.  Amiodarone was decreased to 200 mg daily and she was otherwise continued on metoprolol and Xarelto.  She does not report any spontaneous bleeding problems.  Blood pressure remains elevated today.  She tolerated addition of low-dose Cozaar which we will increase to 25 mg daily.  Physical Exam: VS:  BP (!) 164/70   Pulse 62   Ht '5\' 5"'$  (1.651 m)   Wt 161 lb 3.2 oz (73.1 kg)   SpO2 97%   BMI 26.83 kg/m , BMI Body mass index is 26.83 kg/m.  Wt Readings from Last 3 Encounters:  06/26/22 161 lb 3.2 oz (73.1 kg)  06/06/22 160 lb 12.8 oz (72.9 kg)  12/16/21 156 lb 9.6 oz (71 kg)    General: Patient appears comfortable at rest. HEENT: Conjunctiva and lids normal. Neck: Supple, no elevated JVP or carotid bruits. Lungs: Clear to auscultation, nonlabored breathing at rest. Cardiac: Regular rate and rhythm, no S3, 1/6 systolic murmur. Extremities: No pitting edema.  ECG:  An ECG dated 06/06/2022 was personally reviewed today and demonstrated:  Arrhythmia with poor R wave progression, rule out old anterior infarct pattern.  Labwork: 10/30/2021: B Natriuretic Peptide 435.0 10/31/2021: TSH 1.718 11/01/2021: Magnesium 1.7 11/11/2021: Hemoglobin 11.9; Platelets 321 12/26/2021: BUN 23; Creatinine, Ser 1.35; Potassium 4.4; Sodium 138     Component Value Date/Time   CHOL 128 10/31/2021 0100   TRIG 57 10/31/2021 0100   HDL 61 10/31/2021 0100   CHOLHDL 2.1 10/31/2021 0100   VLDL 11 10/31/2021 0100   LDLCALC 56 10/31/2021 0100  November 2023: TSH  3.41  Other Studies Reviewed Today:  Echocardiogram 10/31/2021:  1. No significant pericardial effusion.   2. Left ventricular ejection fraction, by estimation, is 60 to 65%. The  left ventricle has normal function. The left ventricle has no regional  wall motion abnormalities. Left ventricular diastolic parameters are  indeterminate.   3. Right ventricular systolic function is normal. The right ventricular  size is normal.   4. Left atrial size was mildly dilated.   5. The mitral valve is normal in structure. Mild mitral valve  regurgitation. No evidence of mitral stenosis.   6. The aortic valve is normal in structure. Aortic valve regurgitation is  not visualized. No aortic stenosis is present.   7. The inferior vena cava is normal in size with greater than 50%  respiratory variability, suggesting right atrial pressure of 3 mmHg.   Assessment and Plan:  1.  Persistent atrial fibrillation with CHA2DS2-VASc score of 4.  She is status post successful atrial fibrillation ablation and had recent follow-up with Dr. Quentin Ore at which time amiodarone was cut back to 100 mg daily.  She continues on Toprol-XL and Xarelto, no reported bleeding problems.  No changes were made today, keep follow-up with EP as scheduled.  2.  Essential hypertension.  Still not optimally controlled.  Increase Cozaar to 25 mg daily.  3.  CAD, overall mild to moderate with the exception of approximately 80% stenosis of the distal circumflex  in small caliber segment managed medically.  No progressive angina reported.  Continue Crestor, her most recent LDL was 56.  Disposition:  Follow up  6 months.  Signed, Satira Sark, M.D., F.A.C.C.

## 2022-06-26 ENCOUNTER — Encounter: Payer: Self-pay | Admitting: Cardiology

## 2022-06-26 ENCOUNTER — Ambulatory Visit: Payer: Medicare Other | Attending: Cardiology | Admitting: Cardiology

## 2022-06-26 VITALS — BP 164/70 | HR 62 | Ht 65.0 in | Wt 161.2 lb

## 2022-06-26 DIAGNOSIS — I10 Essential (primary) hypertension: Secondary | ICD-10-CM

## 2022-06-26 DIAGNOSIS — I25119 Atherosclerotic heart disease of native coronary artery with unspecified angina pectoris: Secondary | ICD-10-CM | POA: Diagnosis not present

## 2022-06-26 DIAGNOSIS — I4819 Other persistent atrial fibrillation: Secondary | ICD-10-CM | POA: Diagnosis not present

## 2022-06-26 MED ORDER — LOSARTAN POTASSIUM 25 MG PO TABS: 25.0000 mg | ORAL_TABLET | Freq: Every day | ORAL | 2 refills | Status: DC

## 2022-06-26 NOTE — Patient Instructions (Addendum)
Medication Instructions:  Your physician has recommended you make the following change in your medication:  Increase losartan to 25 daily Continue other medications the same  Labwork: none  Testing/Procedures: none  Follow-Up: Your physician recommends that you schedule a follow-up appointment in: 6 months  Any Other Special Instructions Will Be Listed Below (If Applicable).  If you need a refill on your cardiac medications before your next appointment, please call your pharmacy.

## 2022-07-21 DIAGNOSIS — M25561 Pain in right knee: Secondary | ICD-10-CM | POA: Insufficient documentation

## 2022-07-25 ENCOUNTER — Other Ambulatory Visit: Payer: Self-pay | Admitting: Orthopedic Surgery

## 2022-07-25 DIAGNOSIS — M5451 Vertebrogenic low back pain: Secondary | ICD-10-CM

## 2022-07-30 ENCOUNTER — Ambulatory Visit
Admission: RE | Admit: 2022-07-30 | Discharge: 2022-07-30 | Disposition: A | Discharge status: Home / Self Care | Payer: Medicare Other | Source: Ambulatory Visit | Attending: Orthopedic Surgery | Admitting: Orthopedic Surgery

## 2022-07-30 DIAGNOSIS — M5451 Vertebrogenic low back pain: Secondary | ICD-10-CM

## 2022-07-30 MED ORDER — MEPERIDINE HCL 50 MG/ML IJ SOLN: 50.0000 mg | Freq: Once | INTRAMUSCULAR | Status: DC | PRN

## 2022-07-30 MED ORDER — DIAZEPAM 5 MG PO TABS
5.0000 mg | ORAL_TABLET | Freq: Once | ORAL | Status: AC
Start: 2022-07-30 — End: 2022-07-30
  Administered 2022-07-30: 5 mg via ORAL

## 2022-07-30 MED ORDER — ONDANSETRON HCL 4 MG/2ML IJ SOLN: 4.0000 mg | Freq: Once | INTRAMUSCULAR | Status: DC | PRN

## 2022-07-30 MED ORDER — IOPAMIDOL (ISOVUE-M 200) INJECTION 41%
20.0000 mL | Freq: Once | INTRAMUSCULAR | Status: AC
  Administered 2022-07-30: 20 mL via INTRATHECAL

## 2022-07-30 NOTE — Discharge Instructions (Signed)

## 2022-07-31 ENCOUNTER — Other Ambulatory Visit: Payer: Self-pay

## 2022-07-31 ENCOUNTER — Emergency Department (HOSPITAL_COMMUNITY): Payer: Medicare Other

## 2022-07-31 ENCOUNTER — Emergency Department (HOSPITAL_COMMUNITY)
Admission: EM | Admit: 2022-07-31 | Discharge: 2022-07-31 | Disposition: A | Discharge status: Home / Self Care | Payer: Medicare Other | Attending: Emergency Medicine | Admitting: Emergency Medicine

## 2022-07-31 ENCOUNTER — Encounter (HOSPITAL_COMMUNITY): Payer: Self-pay

## 2022-07-31 DIAGNOSIS — Z79899 Other long term (current) drug therapy: Secondary | ICD-10-CM | POA: Insufficient documentation

## 2022-07-31 DIAGNOSIS — M545 Low back pain, unspecified: Secondary | ICD-10-CM | POA: Insufficient documentation

## 2022-07-31 DIAGNOSIS — Y92410 Unspecified street and highway as the place of occurrence of the external cause: Secondary | ICD-10-CM | POA: Diagnosis not present
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
## 2022-07-31 NOTE — ED Notes (Signed)
Patient transported to X-ray 

## 2022-07-31 NOTE — Discharge Instructions (Signed)
If you develop worsening, recurrent, or continued back pain, numbness or weakness in the legs, incontinence of your bowels or bladders, numbness of your buttocks, fever, abdominal pain, or any other new/concerning symptoms then return to the ER for evaluation.  
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
## 2022-08-06 ENCOUNTER — Other Ambulatory Visit (HOSPITAL_COMMUNITY): Payer: Self-pay | Admitting: Adult Health Nurse Practitioner

## 2022-08-06 DIAGNOSIS — Z1231 Encounter for screening mammogram for malignant neoplasm of breast: Secondary | ICD-10-CM

## 2022-08-07 ENCOUNTER — Telehealth: Payer: Self-pay | Admitting: Cardiology

## 2022-08-07 NOTE — Telephone Encounter (Signed)
Reports little SOB but improved from several months ago. Did not sound SOB on phone. Spoke with full sentences without pauses. Reports swelling in lower legs and feet that started 3-4 days ago. Denies redness or tenderness in legs or feet. Reports she is able to wear her shoes. Says she's never had problems with swelling in legs or feet in the past. Denies change in diet or travel. Reports not using a lot of salt. Reports up all night voiding. Denies palpitations, dizziness or chest pain. Denies cough, congestion or fever. Does not weigh at home regularly. Last weight was 142 lbs last week. Has not checked BP or HR due to malfunction with monitor. Gave first available appointment 08/14/2022 @2 :30 pm with Philis Nettle. Advised to contact PCP regarding frequent bathroom trips during the night. Verbalized understanding of plan.

## 2022-08-07 NOTE — Telephone Encounter (Signed)
Pt c/o swelling: STAT is pt has developed SOB within 24 hours  If swelling, where is the swelling located? Feet and legs  How much weight have you gained and in what time span? NA  Have you gained 3 pounds in a day or 5 pounds in a week? No  Do you have a log of your daily weights (if so, list)? No  Are you currently taking a fluid pill? No  Are you currently SOB? Yes  Have you traveled recently? No   Pt states that she had a medication change at last visit. She thinks that that may have something to do with swelling as well as SOB, elevated BP and kidney issues.  Call transferred

## 2022-08-14 ENCOUNTER — Ambulatory Visit: Payer: Medicare Other | Admitting: Nurse Practitioner

## 2022-09-08 ENCOUNTER — Other Ambulatory Visit: Payer: Self-pay | Admitting: Cardiology

## 2022-10-13 DIAGNOSIS — I129 Hypertensive chronic kidney disease with stage 1 through stage 4 chronic kidney disease, or unspecified chronic kidney disease: Secondary | ICD-10-CM | POA: Insufficient documentation

## 2022-10-13 DIAGNOSIS — E559 Vitamin D deficiency, unspecified: Secondary | ICD-10-CM | POA: Insufficient documentation

## 2022-10-13 DIAGNOSIS — G629 Polyneuropathy, unspecified: Secondary | ICD-10-CM | POA: Insufficient documentation

## 2022-10-13 DIAGNOSIS — N1831 Chronic kidney disease, stage 3a: Secondary | ICD-10-CM | POA: Insufficient documentation

## 2022-11-10 ENCOUNTER — Ambulatory Visit (HOSPITAL_COMMUNITY): Payer: Medicare Other

## 2022-11-13 ENCOUNTER — Encounter (HOSPITAL_COMMUNITY): Payer: Self-pay

## 2022-11-13 ENCOUNTER — Ambulatory Visit (HOSPITAL_COMMUNITY)
Admission: RE | Admit: 2022-11-13 | Discharge: 2022-11-13 | Disposition: A | Discharge status: Home / Self Care | Payer: Medicare Other | Source: Ambulatory Visit | Attending: Adult Health Nurse Practitioner | Admitting: Adult Health Nurse Practitioner

## 2022-11-13 DIAGNOSIS — Z1231 Encounter for screening mammogram for malignant neoplasm of breast: Secondary | ICD-10-CM | POA: Diagnosis not present

## 2022-11-27 ENCOUNTER — Ambulatory Visit: Payer: Medicare Other | Attending: Cardiology | Admitting: Cardiology

## 2022-11-27 ENCOUNTER — Encounter: Payer: Self-pay | Admitting: Cardiology

## 2022-11-27 VITALS — BP 144/70 | HR 65 | Ht 65.0 in | Wt 161.0 lb

## 2022-11-27 DIAGNOSIS — I10 Essential (primary) hypertension: Secondary | ICD-10-CM

## 2022-11-27 DIAGNOSIS — I4819 Other persistent atrial fibrillation: Secondary | ICD-10-CM | POA: Diagnosis not present

## 2022-11-27 DIAGNOSIS — Z79899 Other long term (current) drug therapy: Secondary | ICD-10-CM | POA: Diagnosis not present

## 2022-11-27 MED ORDER — LOSARTAN POTASSIUM 50 MG PO TABS: 50.0000 mg | ORAL_TABLET | Freq: Every day | ORAL | 3 refills | Status: DC

## 2022-11-27 NOTE — Progress Notes (Signed)
  Electrophysiology Office Follow up Visit Note:    Date:  11/27/2022   ID:  Drexel, Alavez [birth year], MRN [id number]  PCP:  Roe Rutherford, NP  [facility] Cardiologist:  Nona Dell, MD  [facility] Electrophysiologist:  Lanier Prude, MD    Interval History:    [name] is a 82 y.o. female who presents for a follow up visit.   Last seen June 06, 2022 by me.  She had a catheter ablation October 28, 2021 during which the veins and posterior wall were isolated.  At the last appointment with me I reduced her amiodarone 200 mg by mouth once daily.  She takes Xarelto for stroke prophylaxis.  She tells me that her heart rhythm has been well-controlled at home.  She does check her blood pressures regularly and they have been running greater than 140 mmHg systolic.     Past medical, surgical, social and family history were reviewed.  ROS:   Please see the history of present illness.    All other systems reviewed and are negative.  EKGs/Labs/Other Studies Reviewed:    The following studies were reviewed today:    EKG Interpretation Date/Time:  Thursday November 27 2022 10:25:56 EDT Ventricular Rate:  65 PR Interval:  178 QRS Duration:  90 QT Interval:  416 QTC Calculation: 432 R Axis:   56  Text Interpretation: Normal sinus rhythm with sinus arrhythmia Confirmed by Steffanie Dunn [phone]) on 11/27/2022 10:45:37 AM    Physical Exam:    VS:  BP (!) 144/70   Pulse 65   Ht 5\' 5"  (1.651 m)   Wt 161 lb (73 kg)   SpO2 97%   BMI 26.79 kg/m     Wt Readings from Last 3 Encounters:  11/27/22 161 lb (73 kg)  07/31/22 161 lb 2.5 oz (73.1 kg)  06/26/22 161 lb 3.2 oz (73.1 kg)     GEN:  Well nourished, well developed in no acute distress CARDIAC: RRR, no murmurs, rubs, gallops RESPIRATORY:  Clear to auscultation without rales, wheezing or rhonchi       ASSESSMENT:    1. Persistent atrial fibrillation (HCC)   2. Primary hypertension   3. Encounter  for long-term (current) use of high-risk medication    PLAN:    In order of problems listed above:  #Persistent atrial fibrillation  #High risk drug monitoring-amiodarone Maintaining sinus rhythm after her July 2023 catheter ablation.  Continue metoprolol.  Stop amiodarone.  Continue Xarelto.   #Hypertension Above goal today.  Recommend checking blood pressures 1-2 times per week at home and recording the values.  Recommend bringing these recordings to the primary care physician. Continue losartan but increase dose to 50 mg by mouth once daily Continue metoprolol   Follow-up 6 months with APP.   Signed, Steffanie Dunn, MD, [facility], [facility] 11/27/2022 10:49 AM    Electrophysiology [facility]

## 2022-11-27 NOTE — Patient Instructions (Signed)
Medication Instructions:  Your physician has recommended you make the following change in your medication:  1) INCREASE losartan to 50 mg daily  2) STOP taking amiodarone  *If you need a refill on your cardiac medications before your next appointment, please call your pharmacy*  Follow-Up: At [facility], you and your health needs are our priority.  As part of our continuing mission to provide you with exceptional heart care, we have created designated Provider Care Teams.  These Care Teams include your primary Cardiologist (physician) and Advanced Practice Providers (APPs -  Physician Assistants and Nurse Practitioners) who all work together to provide you with the care you need, when you need it.  Your next appointment:   6 months  Provider:   You will see one of the following Advanced Practice Providers on your designated Care Team:   [name], [name] [address] [place], [place] Melinda Don, NP Melinda Brim, NP

## 2022-12-29 NOTE — Progress Notes (Unsigned)
Cardiology Office Note  Date: 12/30/2022   ID: Aria, Mattis 1940-05-29, MRN [id number]  History of Present Illness: [name] is an 82 y.o. female last seen in February.  She is here for a follow-up visit.  States that she has been doing very well, NYHA class I dyspnea, no exertional chest pain or palpitations.  Recent follow-up visit noted with Dr. Lalla Brothers in late August, I reviewed the note.  She was taken off amiodarone at that time.  I reviewed the remainder of her medications which are stable from a cardiac perspective.  She is tolerating losartan at 50 mg daily with better blood pressure control.  Also on Toprol-XL, Xarelto, and Crestor as before.  Physical Exam: VS:  BP 126/70   Pulse 60   Ht 5' 5.5" (1.664 m)   Wt 159 lb (72.1 kg)   SpO2 98%   BMI 26.06 kg/m , BMI Body mass index is 26.06 kg/m.  Wt Readings from Last 3 Encounters:  12/30/22 159 lb (72.1 kg)  11/27/22 161 lb (73 kg)  07/31/22 161 lb 2.5 oz (73.1 kg)    General: Patient appears comfortable at rest. HEENT: Conjunctiva and lids normal. Neck: Supple, no elevated JVP or carotid bruits. Lungs: Clear to auscultation, nonlabored breathing at rest. Cardiac: Regular rate and rhythm, no S3, 1/6 systolic murmur. Extremities: No pitting edema.  ECG:  An ECG dated 11/27/2022 was personally reviewed today and demonstrated:  Sinus arrhythmia.  Labwork:    Component Value Date/Time   CHOL 128 10/31/2021 0100   TRIG 57 10/31/2021 0100   HDL 61 10/31/2021 0100   CHOLHDL 2.1 10/31/2021 0100   VLDL 11 10/31/2021 0100   LDLCALC 56 10/31/2021 0100  June 2024: Hemoglobin 12.3, platelets 299, TSH 2.96, BUN 22, creatinine 1.13, potassium 4.6, AST 32, ALT 16, cholesterol 155, triglycerides 76, HDL 82, LDL 59  Other Studies Reviewed Today:  Echocardiogram 10/31/2021:  1. No significant pericardial effusion.   2. Left ventricular ejection fraction, by estimation, is 60 to 65%. The  left ventricle has normal  function. The left ventricle has no regional  wall motion abnormalities. Left ventricular diastolic parameters are  indeterminate.   3. Right ventricular systolic function is normal. The right ventricular  size is normal.   4. Left atrial size was mildly dilated.   5. The mitral valve is normal in structure. Mild mitral valve  regurgitation. No evidence of mitral stenosis.   6. The aortic valve is normal in structure. Aortic valve regurgitation is  not visualized. No aortic stenosis is present.   7. The inferior vena cava is normal in size with greater than 50%  respiratory variability, suggesting right atrial pressure of 3 mmHg.   Assessment and Plan:  1.  Persistent atrial fibrillation with CHA2DS2-VASc score of 4.  She is status post successful atrial fibrillation ablation in August 2023.  Continues to do well and recently taken off amiodarone by Dr. Lalla Brothers.  Continue observation on Toprol-XL and Xarelto.  She reports no spontaneous bleeding problems.   2.  Essential hypertension.  Blood pressure better controlled today, continue Cozaar 50 mg daily.  3.  CAD, overall mild to moderate with the exception of approximately 80% stenosis of the distal circumflex in small caliber segment managed medically.  She does not report any active angina.  Not on aspirin given use of Xarelto.  Continue Crestor and as needed nitroglycerin.  4.  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  LDL 59 in June.  Disposition:  Follow up  6 months.  Signed, Jonelle Sidle, M.D., F.A.C.C. [facility]

## 2022-12-30 ENCOUNTER — Ambulatory Visit: Payer: Medicare Other | Attending: Cardiology | Admitting: Cardiology

## 2022-12-30 ENCOUNTER — Encounter: Payer: Self-pay | Admitting: Cardiology

## 2022-12-30 VITALS — BP 126/70 | HR 60 | Ht 65.5 in | Wt 159.0 lb

## 2022-12-30 DIAGNOSIS — I4819 Other persistent atrial fibrillation: Secondary | ICD-10-CM | POA: Diagnosis not present

## 2022-12-30 DIAGNOSIS — E782 Mixed hyperlipidemia: Secondary | ICD-10-CM | POA: Diagnosis not present

## 2022-12-30 DIAGNOSIS — I25119 Atherosclerotic heart disease of native coronary artery with unspecified angina pectoris: Secondary | ICD-10-CM

## 2022-12-30 DIAGNOSIS — I10 Essential (primary) hypertension: Secondary | ICD-10-CM

## 2022-12-30 NOTE — Patient Instructions (Signed)

## 2023-04-13 DIAGNOSIS — D164 Benign neoplasm of bones of skull and face: Secondary | ICD-10-CM | POA: Insufficient documentation

## 2023-04-13 DIAGNOSIS — L989 Disorder of the skin and subcutaneous tissue, unspecified: Secondary | ICD-10-CM | POA: Insufficient documentation

## 2023-04-13 DIAGNOSIS — J3089 Other allergic rhinitis: Secondary | ICD-10-CM | POA: Insufficient documentation

## 2023-04-13 DIAGNOSIS — J342 Deviated nasal septum: Secondary | ICD-10-CM | POA: Insufficient documentation

## 2023-05-14 ENCOUNTER — Telehealth: Payer: Self-pay | Admitting: Nurse Practitioner

## 2023-05-14 ENCOUNTER — Ambulatory Visit: Payer: Medicare Other | Attending: Nurse Practitioner | Admitting: Nurse Practitioner

## 2023-05-14 ENCOUNTER — Other Ambulatory Visit: Payer: Self-pay | Admitting: *Deleted

## 2023-05-14 ENCOUNTER — Encounter: Payer: Self-pay | Admitting: Nurse Practitioner

## 2023-05-14 ENCOUNTER — Ambulatory Visit: Payer: Medicare Other | Attending: Nurse Practitioner

## 2023-05-14 ENCOUNTER — Other Ambulatory Visit: Payer: Self-pay | Admitting: Nurse Practitioner

## 2023-05-14 ENCOUNTER — Encounter: Payer: Self-pay | Admitting: *Deleted

## 2023-05-14 VITALS — BP 142/71 | HR 80 | Ht 65.0 in | Wt 152.0 lb

## 2023-05-14 DIAGNOSIS — I2510 Atherosclerotic heart disease of native coronary artery without angina pectoris: Secondary | ICD-10-CM | POA: Diagnosis not present

## 2023-05-14 DIAGNOSIS — I4891 Unspecified atrial fibrillation: Secondary | ICD-10-CM | POA: Diagnosis not present

## 2023-05-14 DIAGNOSIS — I480 Paroxysmal atrial fibrillation: Secondary | ICD-10-CM | POA: Diagnosis not present

## 2023-05-14 DIAGNOSIS — I10 Essential (primary) hypertension: Secondary | ICD-10-CM | POA: Diagnosis not present

## 2023-05-14 DIAGNOSIS — R600 Localized edema: Secondary | ICD-10-CM

## 2023-05-14 DIAGNOSIS — Z7901 Long term (current) use of anticoagulants: Secondary | ICD-10-CM | POA: Diagnosis not present

## 2023-05-14 DIAGNOSIS — J449 Chronic obstructive pulmonary disease, unspecified: Secondary | ICD-10-CM

## 2023-05-14 DIAGNOSIS — R0609 Other forms of dyspnea: Secondary | ICD-10-CM

## 2023-05-14 DIAGNOSIS — R0602 Shortness of breath: Secondary | ICD-10-CM

## 2023-05-14 MED ORDER — WARFARIN SODIUM 5 MG PO TABS: 5.0000 mg | ORAL_TABLET | Freq: Every day | ORAL | 3 refills | Status: DC

## 2023-05-14 NOTE — Patient Instructions (Signed)
Pt saw Melinda Dapper NP today for OV and wants to change from Xarelto to warfarin due to cost.  She has 2 days of Xarelto left.  Rx sent in for warfarin 5mg  daily.  Pt will take Xarelto and warfarin on 1/16 and 1/17.  On 1/18 she will continue warfarin 5mg  daily only.  Rx for warfarin was sent to pharmacy.  INR appt was made for 05/20/23.  Pt verbalized understanding of above.

## 2023-05-14 NOTE — Patient Instructions (Addendum)
Medication Instructions:  Your physician recommends that you continue on your current medications as directed. Please refer to the Current Medication list given to you today.  Labwork: None   Testing/Procedures: Your physician has requested that you have an echocardiogram. Echocardiography is a painless test that uses sound waves to create images of your heart. It provides your doctor with information about the size and shape of your heart and how well your heart's chambers and valves are working. This procedure takes approximately one hour. There are no restrictions for this procedure. Please do NOT wear cologne, perfume, aftershave, or lotions (deodorant is allowed). Please arrive 15 minutes prior to your appointment time.  Please note: We ask at that you not bring children with you during ultrasound (echo/ vascular) testing. Due to room size and safety concerns, children are not allowed in the ultrasound rooms during exams. Our front office staff cannot provide observation of children in our lobby area while testing is being conducted. An adult accompanying a patient to their appointment will only be allowed in the ultrasound room at the discretion of the ultrasound technician under special circumstances. We apologize for any inconvenience. ZIO- Long Term Monitor Instructions   Your physician has requested you wear your ZIO patch monitor 14 days.   This is a single patch monitor.  [employer] supplies one patch monitor per enrollment.  Additional stickers are not available.   Please do not apply patch if you will be having a Nuclear Stress Test, Echocardiogram, Cardiac CT, MRI, or Chest Xray during the time frame you would be wearing the monitor. The patch cannot be worn during these tests.  You cannot remove and re-apply the ZIO XT patch monitor.   Your ZIO patch monitor will be sent USPS Priority mail from [facility] directly to your home address. The monitor may also be mailed to a PO  BOX if home delivery is not available.   It may take 3-5 days to receive your monitor after you have been enrolled.   Once you have received you monitor, please review enclosed instructions.  Your monitor has already been registered assigning a specific monitor serial # to you.   Applying the monitor   Shave hair from upper left chest.   Hold abrader disc by orange tab.  Rub abrader in 40 strokes over left upper chest as indicated in your monitor instructions.   Clean area with 4 enclosed alcohol pads .  Use all pads to assure are is cleaned thoroughly.  Let dry.   Apply patch as indicated in monitor instructions.  Patch will be place under collarbone on left side of chest with arrow pointing upward.   Rub patch adhesive wings for 2 minutes.Remove white label marked "1".  Remove white label marked "2".  Rub patch adhesive wings for 2 additional minutes.   While looking in a mirror, press and release button in center of patch.  A small green light will flash 3-4 times .  This will be your only indicator the monitor has been turned on.     Do not shower for the first 24 hours.  You may shower after the first 24 hours.   Press button if you feel a symptom. You will hear a small click.  Record Date, Time and Symptom in the Patient Log Book.   When you are ready to remove patch, follow instructions on last 2 pages of Patient Log Book.  Stick patch monitor onto last page of Patient Log Book.  Place Patient Log Book in [place] box.  Use locking tab on box and tape box closed securely.  The [employer] and [employer] has [employer] on it.  Please place in mailbox as soon as possible.  Your physician should have your test results approximately 7 days after the monitor has been mailed back to [facility].   Call [facility] Customer Care at [phone] if you have questions regarding your ZIO XT patch monitor.  Call them immediately if you see an orange light blinking on your monitor.   If  your monitor falls off in less than 4 days contact our Monitor department at [phone].  If your monitor becomes loose or falls off after 4 days call [employer] at [phone] for suggestions on securing your monitor.  Follow-Up: Your physician recommends that you schedule a follow-up appointment in: as scheduled   Any Other Special Instructions Will Be Listed Below (If Applicable).  If you need a refill on your cardiac medications before your next appointment, please call your pharmacy.

## 2023-05-14 NOTE — Progress Notes (Addendum)
Cardiology Office Note:  .   Date:  05/14/2023 ID:  [name], DOB [birth year], MRN [id number] PCP: Roe Rutherford, NP  [facility] Providers Cardiologist:  Nona Dell, MD Electrophysiologist:  Lanier Prude, MD    History of Present Illness: .   [name] is a 83 y.o. female with a PMH of CAD, persistent A-fib, status post ablation in August 2023 (followed by EP), hypertension, asthma, COPD, GERD, hypothyroidism, rheumatoid arthritis/osteoarthritis, history of breast cancer, CKD, chronic pain syndrome, lumbar radiculopathy, who presents today for scheduled follow-up.  Last seen by Dr. Diona Browner on December 30, 2022.  She was doing very well at that time.  Today she presents for follow-up.  She states she has not been doing well recently. Admits to recent DOE for the past 2 weeks, believes she is having issues with A-fib. She admits to recent swelling noted along her lower extremities bilaterally. BP is well-controlled at home even though BP is up today in the office.  She also admits to difficulty affording Xarelto, wants to know if she can have a more affordable medication option. Denies any chest pain, syncope, presyncope, dizziness, orthopnea, PND, significant weight changes, acute bleeding, or claudication.  ROS: Negative.  See HPI.  Studies Reviewed: Marland Kitchen    EKG:  EKG Interpretation Date/Time:  Thursday May 14 2023 11:27:04 EST Ventricular Rate:  79 PR Interval:  160 QRS Duration:  86 QT Interval:  396 QTC Calculation: 454 R Axis:   92  Text Interpretation: Sinus rhythm with Premature atrial complexes Rightward axis Septal infarct (cited on or before 14-May-2023) When compared with ECG of 27-Nov-2022 10:25, Premature atrial complexes are now Present Questionable change in initial forces of Anterior leads Confirmed by Sharlene Dory [phone]) on 05/14/2023 11:36:25 AM   Echocardiogram 10/2021:  1. No significant pericardial effusion.   2. Left  ventricular ejection fraction, by estimation, is 60 to 65%. The  left ventricle has normal function. The left ventricle has no regional  wall motion abnormalities. Left ventricular diastolic parameters are  indeterminate.   3. Right ventricular systolic function is normal. The right ventricular  size is normal.   4. Left atrial size was mildly dilated.   5. The mitral valve is normal in structure. Mild mitral valve  regurgitation. No evidence of mitral stenosis.   6. The aortic valve is normal in structure. Aortic valve regurgitation is  not visualized. No aortic stenosis is present.   7. The inferior vena cava is normal in size with greater than 50%  respiratory variability, suggesting right atrial pressure of 3 mmHg.   CT cardiac 09/2021:  IMPRESSION: 1. There is normal pulmonary vein drainage into the left atrium with ostial measurements above.   2. There is no thrombus in the left atrial appendage.   3. The esophagus runs in the left atrial midline and is not in proximity to any of the pulmonary vein ostia.   4. No PFO/ASD.   5. Normal coronary origin. Right dominance.   6. CAC score of 1362 Agatston units which is 93rd percentile for age-, race-, and sex-matched controls.  IMPRESSION: 1. Signs signs of pulmonary emphysema with potential interstitial lung disease. Correlate with respiratory symptoms and consider pulmonology follow-up with high-resolution chest CT as warranted. 2. Coronary artery disease and aortic atherosclerosis. 3. Post RIGHT mastectomy and breast reconstruction also with LEFT breast implant.  LHC 12/2019:  Mid RCA lesion is 40% stenosed. Prox RCA lesion is 20% stenosed. Dist Cx lesion is  80% stenosed. Prox Cx lesion is 50% stenosed. Mid LAD lesion is 20% stenosed.   1. Moderate stenosis mid Circumflex. This lesion is not flow limiting by functional assessment (DFR 0.96). The small caliber distal Circumflex has a severe stenosis but this vessel  appears to be too small for PCI (1.75 mm vessel).  2. Mild plaque in the LAD 3. The RCA is a large dominant vessel with mild to moderate calcified plaque in the mid and distal vessel.    Recommendations: Medical management of CAD. The stenosis in the mid Circumflex is moderate angiographically but by functional flow wire assessment is not flow limiting (DFR 0.96). The small caliber distal Circumflex has a severe stenosis but this vessel appears to be too small for PCI (1.75 mm vessel).   Lexiscan 12/2019:  No diagnostic ST segment changes to indicate ischemia. Occasional PACs. Small, mild intensity, apical anteroseptal defect that is partially reversible in the setting of breast attenuation, mild ischemia not excluded. TID ratio increased at 1.31 so cannot exclude the possibility of more diffuse subendocardial ischemia. This is a high risk study due to increased TID ratio. Nuclear stress EF: 66%.  Physical Exam:   VS:  BP (!) 142/71 (BP Location: Left Arm, Patient Position: Sitting, Cuff Size: Normal)   Pulse 80   Ht 5\' 5"  (1.651 m)   Wt 152 lb (68.9 kg)   SpO2 98%   BMI 25.29 kg/m    Wt Readings from Last 3 Encounters:  05/14/23 152 lb (68.9 kg)  12/30/22 159 lb (72.1 kg)  11/27/22 161 lb (73 kg)    GEN: Well nourished, well developed in no acute distress NECK: No JVD; No carotid bruits CARDIAC: S1/S2, RRR, no murmurs, rubs, gallops RESPIRATORY:  Clear to auscultation without rales, wheezing or rhonchi  ABDOMEN: Soft, non-tender, non-distended EXTREMITIES:  No edema; No deformity   ASSESSMENT AND PLAN: .    A-fib, s/p ablation in 11/2021, long term use of OAC Does admit to some dyspnea on exertion-wonders if she has been having A-fib for the past 2 weeks.  EKG today confirms normal sinus rhythm with PACs.  Will arrange 2-week ZIO XT monitor to further evaluate her A-fib.  Heart rate is well-controlled at this time.  Continue metoprolol succinate.  She admits to some affordability  issues regarding Xarelto.  Discussed Coumadin and she is agreeable to proceed.  Will get her established with the Coumadin clinic.  CAD Stable with no anginal symptoms. No indication for ischemic evaluation.  Not on aspirin due to being on OAC.  Switching Xarelto to Coumadin as mentioned above.  No other medication changes at this time. Heart healthy diet and regular cardiovascular exercise encouraged.   Hypertension Blood pressure elevated on arrival, second BP on exam improved. Discussed goal SBP < 140. BP well-controlled at home.  Will continue monitor at this time. Discussed to monitor BP at home at least 2 hours after medications and sitting for 5-10 minutes. Heart healthy diet and regular cardiovascular exercise encouraged.  No medication changes at this time.  DOE, COPD Does admit to some DOE over the past 2 weeks.  Last echocardiogram in 2023 showed normal EF.  Etiology multifactorial for DOE.  Will update echocardiogram at this time.  Does have a past history of pulmonary emphysema with potential interstitial lung disease noted on CT in 2023. Heart healthy diet and regular cardiovascular exercise encouraged. Continue to follow with PCP.  Care and ED precautions discussed.  Leg edema Trace, minimal nonpitting edema  noted to lower bilateral ankle area.  No indication for diuretic therapy at this time.  Recommended/encouraged compression stockings and leg elevation PRN.  Low-salt, heart healthy diet and regular cardiovascular exercise encouraged.    Dispo: Patient is requesting to follow-up with Dr. Diona Browner as scheduled.  Follow-up with me/APP sooner if anything changes.  Signed, Sharlene Dory, NP

## 2023-05-14 NOTE — Telephone Encounter (Signed)
Checking percert on the following   LONG TERM MONITOR (3-14 DAYS

## 2023-05-20 ENCOUNTER — Ambulatory Visit: Payer: Medicare Other | Attending: Cardiology | Admitting: *Deleted

## 2023-05-20 DIAGNOSIS — Z7901 Long term (current) use of anticoagulants: Secondary | ICD-10-CM | POA: Diagnosis not present

## 2023-05-20 DIAGNOSIS — I482 Chronic atrial fibrillation, unspecified: Secondary | ICD-10-CM | POA: Diagnosis not present

## 2023-05-20 MED ORDER — RIVAROXABAN 15 MG PO TABS: 15.0000 mg | ORAL_TABLET | Freq: Every day | ORAL | 5 refills | Status: DC

## 2023-05-20 NOTE — Patient Instructions (Addendum)
Has been on warfarin 5mg  daily x 6 days. Take warfarin 1 1/2 tablets tonight then increase dose to 1 tablet daily except 1 1/2 tablets on Tuesdays, Thursdays and Saturdays. Recheck in 1 week AFTER GOING THRU ALL THIS AND PATIENT TEACHING SHE HAS DECIDED TO GO BACK ON XARELTO.  IS NOW WILLING TO PAY THE CO-PAY.

## 2023-05-27 ENCOUNTER — Ambulatory Visit: Payer: Medicare Other

## 2023-06-02 ENCOUNTER — Ambulatory Visit: Payer: Medicare Other | Attending: Nurse Practitioner

## 2023-06-02 DIAGNOSIS — R0609 Other forms of dyspnea: Secondary | ICD-10-CM

## 2023-06-02 DIAGNOSIS — I480 Paroxysmal atrial fibrillation: Secondary | ICD-10-CM | POA: Diagnosis not present

## 2023-06-02 LAB — ECHOCARDIOGRAM COMPLETE
AR max vel: 2.72 cm2
AV Area VTI: 2.42 cm2
AV Area mean vel: 2.43 cm2
AV Mean grad: 5 mm[Hg]
AV Peak grad: 7.7 mm[Hg]
Ao pk vel: 1.39 m/s
Area-P 1/2: 2.58 cm2
Calc EF: 66.4 %
MV VTI: 2.69 cm2
S' Lateral: 2.6 cm
Single Plane A2C EF: 69.1 %
Single Plane A4C EF: 60.1 %

## 2023-06-08 ENCOUNTER — Telehealth: Payer: Self-pay | Admitting: Cardiology

## 2023-06-08 NOTE — Telephone Encounter (Signed)
Patient would like a call back to discuss echo results. 

## 2023-06-21 ENCOUNTER — Telehealth: Payer: Self-pay | Admitting: Student

## 2023-06-21 DIAGNOSIS — R600 Localized edema: Secondary | ICD-10-CM

## 2023-06-21 MED ORDER — FUROSEMIDE 20 MG PO TABS
20.0000 mg | ORAL_TABLET | Freq: Every day | ORAL | 0 refills | Status: DC
Start: 2023-06-21 — End: 2023-09-19

## 2023-06-21 NOTE — Telephone Encounter (Signed)
   Patient called Answering Service with concerns about lower extremity edema. Called and spoke with patient. She states she has significant bilateral lower extremity edema (left > right). She was reported some lower extremity edema at last visit in 05/14/2023 but states she noticed it was significantly worse last night. No erythema. No recent surgery or travel. She is also on Xarelto and has not been missed any doses so discussed that chance of DVT is low. She has some chronic shortness of breath but states this is stable. No orthopnea or PND. No chest pain. She does states he BP has been quite high since last night. Systolic BP was in the 190s last night and BP 183/91 this afternoon. Her systolic BP is usually in the 332R. She states she is Losartan 50mg  and Toprol-XL 50mg  at home. Advised her to take an additional dose of Losartan 50mg  this afternoon. Asked her to let us know if systolic BP remains >160. Will provide her Lasix 20mg  daily and instruct her to take this for 3 days and notify us if no improvement in her edema (renal function and potassium normal at last check in 04/2023 in Care Everywhere). Also recommended limiting salt intake, elevating legs as much as possible, and wearing compression stockings.   Will send message to triage and ask them to call patient tomorrow morning to scheduled a follow-up appointment so that we can assess her lower extremity edema. Advised patient to let us know if she has any new or worsening symptoms. She voiced understanding and thanked me for calling.  Corrin Parker, PA-C 06/21/2023 2:42 PM

## 2023-06-22 NOTE — Telephone Encounter (Signed)
 Spoke with patient - already has visit scheduled with Dr. Diona Browner for 07/08/2023.  Suggested she contact her pcp to see if they possibly could see her sooner & patient will be placed on wait list here as well.  She verbalized understanding.

## 2023-06-23 ENCOUNTER — Encounter: Payer: Self-pay | Admitting: Nurse Practitioner

## 2023-06-23 ENCOUNTER — Ambulatory Visit: Payer: Medicare Other | Attending: Nurse Practitioner | Admitting: Nurse Practitioner

## 2023-06-23 ENCOUNTER — Telehealth: Payer: Self-pay | Admitting: Cardiology

## 2023-06-23 VITALS — BP 155/100 | HR 75 | Ht 65.5 in | Wt 166.0 lb

## 2023-06-23 DIAGNOSIS — R110 Nausea: Secondary | ICD-10-CM

## 2023-06-23 DIAGNOSIS — I4891 Unspecified atrial fibrillation: Secondary | ICD-10-CM | POA: Diagnosis not present

## 2023-06-23 DIAGNOSIS — I10 Essential (primary) hypertension: Secondary | ICD-10-CM | POA: Diagnosis not present

## 2023-06-23 DIAGNOSIS — R600 Localized edema: Secondary | ICD-10-CM | POA: Diagnosis not present

## 2023-06-23 DIAGNOSIS — I2510 Atherosclerotic heart disease of native coronary artery without angina pectoris: Secondary | ICD-10-CM

## 2023-06-23 MED ORDER — FUROSEMIDE 20 MG PO TABS
40.0000 mg | ORAL_TABLET | Freq: Every day | ORAL | 0 refills | Status: DC | PRN
Start: 2023-06-23 — End: 2023-10-16

## 2023-06-23 MED ORDER — METOPROLOL SUCCINATE ER 50 MG PO TB24: 50.0000 mg | ORAL_TABLET | Freq: Two times a day (BID) | ORAL | 1 refills | Status: AC

## 2023-06-23 MED ORDER — BLOOD PRESSURE MONITOR DEVI: 1.0000 | Freq: Every day | 0 refills | Status: DC

## 2023-06-23 NOTE — Telephone Encounter (Signed)
 Advised patient referral was sent to [facility] in [place]

## 2023-06-23 NOTE — Telephone Encounter (Signed)
 Patient called to say her referral needs to go to the [employer] office. Please advise

## 2023-06-23 NOTE — Progress Notes (Signed)
 Cardiology Office Note:  .   Date:  06/23/2023 ID:  [name], [name] 1940/05/11, MRN [id number] PCP: Roe Rutherford, NP  [facility] Providers Cardiologist:  Nona Dell, MD Electrophysiologist:  Lanier Prude, MD    History of Present Illness: .   [name] is a 83 y.o. female with a PMH of CAD, persistent A-fib, status post ablation in August 2023 (followed by EP), hypertension, asthma, COPD, GERD, hypothyroidism, rheumatoid arthritis/osteoarthritis, history of breast cancer, CKD, chronic pain syndrome, lumbar radiculopathy, who presents today for scheduled follow-up.  Last seen by Dr. Diona Browner on December 30, 2022.  She was doing very well at that time.  05/14/2023 - Today she presents for follow-up.  She states she has not been doing well recently. Admits to recent DOE for the past 2 weeks, believes she is having issues with A-fib. She admits to recent swelling noted along her lower extremities bilaterally. BP is well-controlled at home even though BP is up today in the office.  She also admits to difficulty affording Xarelto, wants to know if she can have a more affordable medication option. Denies any chest pain, syncope, presyncope, dizziness, orthopnea, PND, significant weight changes, acute bleeding, or claudication.  06/23/2023 -presents today for follow-up.  Tells me she has not been doing well recently.  Her chief concerns are leg swelling that she noticed over the past weekend as well as nausea (denies any vomiting), and increased blood pressure.  She is unsure what is causing her high blood pressure as she has been reducing her salt intake.  Unsure what is causing her nausea as she denies any fever, chills, or any recent intolerance to certain meals. Denies any chest pain, shortness of breath, palpitations, syncope, presyncope, dizziness, orthopnea, PND, significant weight changes, acute bleeding, or claudication.  ROS: Negative.  See HPI.  Studies Reviewed:  Marland Kitchen    EKG: EKG is not ordered today.   Cardiac monitor 05/2023: ZIO monitor reviewed.  11 days, 2 hours analyzed.   Predominant rhythm is sinus with heart rate ranging from 45 bpm up to 114 bpm and average heart rate 66 bpm. There were frequent PACs representing 6.8% total beats with otherwise occasional atrial couplets and triplets. There were rare PVCs including ventricular couplets representing less than 1% total beats. Multiple (2425) very brief episodes of PSVT were noted, the longest of which was only 17 beats.  There were no sustained arrhythmias.  Some of these episodes occurred with IVCD/aberrancy. There were no pauses or high degree heart block.  Echocardiogram 05/2023: 1. Left ventricular ejection fraction, by estimation, is 60 to 65%. The  left ventricle has normal function. The left ventricle has no regional  wall motion abnormalities. There is mild left ventricular hypertrophy.  Left ventricular diastolic parameters  are consistent with Grade I diastolic dysfunction (impaired relaxation).  Elevated left ventricular end-diastolic pressure. The average left  ventricular global longitudinal strain is -21.1 %. The global longitudinal  strain is normal.   2. Right ventricular systolic function is normal. The right ventricular  size is normal.   3. The mitral valve is normal in structure. Trivial mitral valve  regurgitation. No evidence of mitral stenosis.   4. The aortic valve is tricuspid. Aortic valve regurgitation is not  visualized. No aortic stenosis is present.   5. The inferior vena cava is normal in size with greater than 50%  respiratory variability, suggesting right atrial pressure of 3 mmHg.   6. Cannot exclude a small PFO.  Comparison(s): No significant change from prior study.    Echocardiogram 10/2021:  1. No significant pericardial effusion.   2. Left ventricular ejection fraction, by estimation, is 60 to 65%. The  left ventricle has normal function. The  left ventricle has no regional  wall motion abnormalities. Left ventricular diastolic parameters are  indeterminate.   3. Right ventricular systolic function is normal. The right ventricular  size is normal.   4. Left atrial size was mildly dilated.   5. The mitral valve is normal in structure. Mild mitral valve  regurgitation. No evidence of mitral stenosis.   6. The aortic valve is normal in structure. Aortic valve regurgitation is  not visualized. No aortic stenosis is present.   7. The inferior vena cava is normal in size with greater than 50%  respiratory variability, suggesting right atrial pressure of 3 mmHg.   CT cardiac 09/2021:  IMPRESSION: 1. There is normal pulmonary vein drainage into the left atrium with ostial measurements above.   2. There is no thrombus in the left atrial appendage.   3. The esophagus runs in the left atrial midline and is not in proximity to any of the pulmonary vein ostia.   4. No PFO/ASD.   5. Normal coronary origin. Right dominance.   6. CAC score of 1362 Agatston units which is 93rd percentile for age-, race-, and sex-matched controls.  IMPRESSION: 1. Signs signs of pulmonary emphysema with potential interstitial lung disease. Correlate with respiratory symptoms and consider pulmonology follow-up with high-resolution chest CT as warranted. 2. Coronary artery disease and aortic atherosclerosis. 3. Post RIGHT mastectomy and breast reconstruction also with LEFT breast implant.  LHC 12/2019:  Mid RCA lesion is 40% stenosed. Prox RCA lesion is 20% stenosed. Dist Cx lesion is 80% stenosed. Prox Cx lesion is 50% stenosed. Mid LAD lesion is 20% stenosed.   1. Moderate stenosis mid Circumflex. This lesion is not flow limiting by functional assessment (DFR 0.96). The small caliber distal Circumflex has a severe stenosis but this vessel appears to be too small for PCI (1.75 mm vessel).  2. Mild plaque in the LAD 3. The RCA is a large dominant  vessel with mild to moderate calcified plaque in the mid and distal vessel.    Recommendations: Medical management of CAD. The stenosis in the mid Circumflex is moderate angiographically but by functional flow wire assessment is not flow limiting (DFR 0.96). The small caliber distal Circumflex has a severe stenosis but this vessel appears to be too small for PCI (1.75 mm vessel).   Lexiscan 12/2019:  No diagnostic ST segment changes to indicate ischemia. Occasional PACs. Small, mild intensity, apical anteroseptal defect that is partially reversible in the setting of breast attenuation, mild ischemia not excluded. TID ratio increased at 1.31 so cannot exclude the possibility of more diffuse subendocardial ischemia. This is a high risk study due to increased TID ratio. Nuclear stress EF: 66%.  Physical Exam:   VS:  BP (!) 155/100 (BP Location: Left Arm, Patient Position: Sitting, Cuff Size: Normal)   Pulse 75   Ht 5' 5.5" (1.664 m)   Wt 166 lb (75.3 kg)   SpO2 98%   BMI 27.20 kg/m    Wt Readings from Last 3 Encounters:  06/23/23 166 lb (75.3 kg)  05/14/23 152 lb (68.9 kg)  12/30/22 159 lb (72.1 kg)    Blood pressure on arrival-164/90, repeat BP: 155/100  GEN: Well nourished, well developed in no acute distress NECK: No JVD; No carotid  bruits CARDIAC: S1/S2, RRR, no murmurs, rubs, gallops RESPIRATORY:  Clear to auscultation without rales, wheezing or rhonchi  ABDOMEN: Soft, non-tender, non-distended EXTREMITIES:  No edema; No deformity   ASSESSMENT AND PLAN: .    A-fib, s/p ablation in 11/2021, long term use of OAC Denies any tachycardia or palpitations.  Heart rate is well-controlled today. See recent monitor report noted above.  Increasing metoprolol to 50 mg twice daily.  She has returned to taking Xarelto.  Compliant and tolerating well and denies any bleeding issues.  She is on appropriate dosage.  CAD Stable with no anginal symptoms. No indication for ischemic evaluation.  Not  on aspirin due to being on OAC.  Increasing metoprolol as mentioned above.  No other medication changes at this time. Heart healthy diet and regular cardiovascular exercise encouraged.   Hypertension Blood pressure elevated. Discussed goal SBP < 140. BP well-controlled at home.  Increasing metoprolol succinate to 50 mg twice daily.   Will provide Rx for BP cuff.  Discussed to monitor BP at home at least 2 hours after medications and sitting for 5-10 minutes. Heart healthy diet and regular cardiovascular exercise encouraged.  No other medication changes at this time.  Lower extremity edema Trace, nonpitting edema noted to lower ankle area, particularly to left lateral ankle.  She was on low-dose Lasix for 3 days and says she has not noticed much of a difference.  No significant edema noted on exam.  Will prescribe Lasix 40 mg daily as needed for leg edema.  Will obtain proBNP, BMET, and magnesium level in 1 week.  Previously recommended/encouraged compression stockings and leg elevation PRN.  Low-salt, heart healthy diet and regular cardiovascular exercise encouraged.   5.  Nausea Etiology unclear.  Patient tells me that she was told she need to follow-up and be referred to gastroenterologist for further evaluation.  Will provide this for her today. Continue to follow with PCP.  Dispo: Follow-up with me/APP in 6 to 8 weeks or sooner if anything changes.  Signed, Sharlene Dory, NP

## 2023-06-23 NOTE — Patient Instructions (Addendum)
 Medication Instructions:  Your physician has recommended you make the following change in your medication:  Please Increase Metoprolol to 50 Mg twice daily  Please Change Lasix to 40 Mg daily as needed for leg swelling   Labwork: In 1-2 weeks at [facility]   Testing/Procedures: None   Follow-Up: Your physician recommends that you schedule a follow-up appointment in: 6-8 weeks    Any Other Special Instructions Will Be Listed Below (If Applicable).  If you need a refill on your cardiac medications before your next appointment, please call your pharmacy.

## 2023-06-24 LAB — BASIC METABOLIC PANEL
BUN/Creatinine Ratio: 24 (ref 12–28)
BUN: 25 mg/dL (ref 8–27)
CO2: 23 mmol/L (ref 20–29)
Calcium: 10.3 mg/dL (ref 8.7–10.3)
Chloride: 102 mmol/L (ref 96–106)
Creatinine, Ser: 1.05 mg/dL — ABNORMAL HIGH (ref 0.57–1.00)
Glucose: 100 mg/dL — ABNORMAL HIGH (ref 70–99)
Potassium: 4.3 mmol/L (ref 3.5–5.2)
Sodium: 141 mmol/L (ref 134–144)
eGFR: 53 mL/min/{1.73_m2} — ABNORMAL LOW (ref >59)

## 2023-06-24 LAB — MAGNESIUM: Magnesium: 2.2 mg/dL (ref 1.6–2.3)

## 2023-06-24 LAB — BRAIN NATRIURETIC PEPTIDE: BNP: 82.7 pg/mL (ref 0.0–100.0)

## 2023-06-29 NOTE — Telephone Encounter (Signed)
Referral sent to Dr. Luvenia Starchourk's office.

## 2023-06-29 NOTE — Telephone Encounter (Signed)
 Pt called in stating she had not heard from gastro office.

## 2023-06-29 NOTE — Addendum Note (Signed)
 Addended by: Sharen Hones on: 06/29/2023 04:30 PM   Modules accepted: Orders

## 2023-07-01 ENCOUNTER — Other Ambulatory Visit (HOSPITAL_COMMUNITY): Payer: Self-pay | Admitting: Adult Health Nurse Practitioner

## 2023-07-01 DIAGNOSIS — R2242 Localized swelling, mass and lump, left lower limb: Secondary | ICD-10-CM

## 2023-07-02 ENCOUNTER — Ambulatory Visit (HOSPITAL_COMMUNITY)
Admission: RE | Admit: 2023-07-02 | Discharge: 2023-07-02 | Disposition: A | Discharge status: Home / Self Care | Source: Ambulatory Visit | Attending: Adult Health Nurse Practitioner | Admitting: Adult Health Nurse Practitioner

## 2023-07-02 DIAGNOSIS — R2242 Localized swelling, mass and lump, left lower limb: Secondary | ICD-10-CM | POA: Diagnosis present

## 2023-07-03 ENCOUNTER — Other Ambulatory Visit: Payer: Self-pay | Admitting: Nurse Practitioner

## 2023-07-03 DIAGNOSIS — R600 Localized edema: Secondary | ICD-10-CM

## 2023-07-08 ENCOUNTER — Ambulatory Visit: Payer: Medicare Other | Admitting: Cardiology

## 2023-07-20 ENCOUNTER — Ambulatory Visit: Admitting: Podiatry

## 2023-07-20 ENCOUNTER — Encounter: Payer: Self-pay | Admitting: Podiatry

## 2023-07-20 ENCOUNTER — Ambulatory Visit (INDEPENDENT_AMBULATORY_CARE_PROVIDER_SITE_OTHER)

## 2023-07-20 DIAGNOSIS — M7752 Other enthesopathy of left foot: Secondary | ICD-10-CM

## 2023-07-20 DIAGNOSIS — M25472 Effusion, left ankle: Secondary | ICD-10-CM | POA: Diagnosis not present

## 2023-07-20 DIAGNOSIS — M79672 Pain in left foot: Secondary | ICD-10-CM

## 2023-07-20 NOTE — Progress Notes (Signed)
  Subjective:  Patient ID: [name], female    DOB: [birth year],  MRN: [id number]  Chief Complaint  Patient presents with   Foot Pain    RM#11 Left foot pain started two weeks ago when experienced pain and swelling no injury to the foot has gotten better patient states still some swelling. Antibiotic prescribed by pcp and has completed.    Discussed the use of AI scribe software for clinical note transcription with the patient, who gave verbal consent to proceed.  History of Present Illness The patient presents with persistent left foot swelling for the past two weeks. The swelling has been improving daily but is still present. She denies any known injury or insect bite to the foot. She has a history of a bone infection in the same foot many years ago, which was managed with immobilization in a boot for six to eight months. She has been on Lasix and a course of doxycycline, which she has just completed. She denies any recent fevers or chills. She also reports a long-standing issue of burning feet at night, which affects both feet and is managed with a small dose of gabapentin. She is also on Xarelto, a blood thinner.      Objective:    Physical Exam CARDIOVASCULAR: DP and PT pulses palpable. Edema present at the ankle without erythema or warmth. MUSCULOSKELETAL: Flexor, extensor tendons intact. No pinpoint tenderness in the leg. No pain with calf compression, no erythema or warmth. NEUROLOGICAL: Sensation intact with light touch DERMATOLOGICAL:  No skin breakdown, erythema or warmth.    No images are attached to the encounter.    Results    Assessment:   1. Left ankle swelling   2. Capsulitis of ankle, left      Plan:  Patient was evaluated and treated and all questions answered.  Assessment and Plan Assessment & Plan Left foot swelling Swelling in the left foot for two weeks without pain, erythema, or warmth. Previous bone infection considered, but normal WBC, sed  rate, and CRP suggest no active infection. Swelling improved with antibiotics, indicating possible infectious component. Differential includes venous insufficiency, DVT, or gout, though gout is less likely due to absence of pain. Further imaging and blood work warranted. - X-rays of the left foot, ankle were obtained.  Multiple views obtained.  There is no evidence of acute fracture or any cortical changes suggest osteomyelitis. - Repeat CBC, sed rate, CRP, and will check a uric acid. - Order venous duplex ultrasound. - If workup is negative, proceed with MRI of the ankle.  Burning sensation in feet Chronic burning sensation in both feet managed with low-dose gabapentin.   Return in about 2 weeks (around 08/03/2023).   Vivi Barrack DPM

## 2023-07-21 LAB — CBC WITH DIFFERENTIAL/PLATELET
Absolute Lymphocytes: 3109 {cells}/uL (ref 850–3900)
Absolute Monocytes: 449 {cells}/uL (ref 200–950)
Basophils Absolute: 47 {cells}/uL (ref 0–200)
Basophils Relative: 0.7 %
Eosinophils Absolute: 281 {cells}/uL (ref 15–500)
Eosinophils Relative: 4.2 %
HCT: 33.5 % — ABNORMAL LOW (ref 35.0–45.0)
Hemoglobin: 11.5 g/dL — ABNORMAL LOW (ref 11.7–15.5)
MCH: 31.9 pg (ref 27.0–33.0)
MCHC: 34.3 g/dL (ref 32.0–36.0)
MCV: 92.8 fL (ref 80.0–100.0)
MPV: 10.4 fL (ref 7.5–12.5)
Monocytes Relative: 6.7 %
Neutro Abs: 2814 {cells}/uL (ref 1500–7800)
Neutrophils Relative %: 42 %
Platelets: 259 10*3/uL (ref 140–400)
RBC: 3.61 10*6/uL — ABNORMAL LOW (ref 3.80–5.10)
RDW: 12.2 % (ref 11.0–15.0)
Total Lymphocyte: 46.4 %
WBC: 6.7 10*3/uL (ref 3.8–10.8)

## 2023-07-21 LAB — C-REACTIVE PROTEIN: CRP: <3 mg/L (ref <8.0)

## 2023-07-21 LAB — SEDIMENTATION RATE: Sed Rate: 14 mm/h (ref 0–30)

## 2023-07-21 LAB — URIC ACID: Uric Acid, Serum: 5.6 mg/dL (ref 2.5–7.0)

## 2023-07-22 ENCOUNTER — Ambulatory Visit (HOSPITAL_COMMUNITY)
Admission: RE | Admit: 2023-07-22 | Discharge: 2023-07-22 | Disposition: A | Discharge status: Home / Self Care | Source: Ambulatory Visit | Attending: Podiatry | Admitting: Podiatry

## 2023-07-22 DIAGNOSIS — M25472 Effusion, left ankle: Secondary | ICD-10-CM | POA: Diagnosis present

## 2023-07-27 ENCOUNTER — Other Ambulatory Visit: Payer: Self-pay | Admitting: Podiatry

## 2023-08-04 DIAGNOSIS — M25551 Pain in right hip: Secondary | ICD-10-CM | POA: Insufficient documentation

## 2023-08-06 ENCOUNTER — Encounter: Payer: Self-pay | Admitting: Nurse Practitioner

## 2023-08-06 ENCOUNTER — Ambulatory Visit: Payer: Medicare Other | Attending: Nurse Practitioner | Admitting: Nurse Practitioner

## 2023-08-06 VITALS — BP 138/70 | HR 66 | Ht 65.5 in | Wt 162.4 lb

## 2023-08-06 DIAGNOSIS — R079 Chest pain, unspecified: Secondary | ICD-10-CM

## 2023-08-06 DIAGNOSIS — R0609 Other forms of dyspnea: Secondary | ICD-10-CM | POA: Diagnosis not present

## 2023-08-06 DIAGNOSIS — Z8679 Personal history of other diseases of the circulatory system: Secondary | ICD-10-CM | POA: Diagnosis not present

## 2023-08-06 DIAGNOSIS — I10 Essential (primary) hypertension: Secondary | ICD-10-CM

## 2023-08-06 DIAGNOSIS — I491 Atrial premature depolarization: Secondary | ICD-10-CM

## 2023-08-06 DIAGNOSIS — I25118 Atherosclerotic heart disease of native coronary artery with other forms of angina pectoris: Secondary | ICD-10-CM | POA: Diagnosis not present

## 2023-08-06 MED ORDER — NITROGLYCERIN 0.4 MG SL SUBL: 0.4000 mg | SUBLINGUAL_TABLET | SUBLINGUAL | 3 refills | Status: AC | PRN

## 2023-08-06 NOTE — Patient Instructions (Addendum)
 Medication Instructions:  Your physician recommends that you continue on your current medications as directed. Please refer to the Current Medication list given to you today.  Labwork: None   Testing/Procedures: Your physician has requested that you have a lexiscan myoview. For further information please visit [email or web address] Please follow instruction sheet, as given.  Follow-Up: Your physician recommends that you schedule a follow-up appointment in: 4-6 weeks   Any Other Special Instructions Will Be Listed Below (If Applicable).  If you need a refill on your cardiac medications before your next appointment, please call your pharmacy.

## 2023-08-06 NOTE — Progress Notes (Unsigned)
 Cardiology Office Note:  .   Date: 08/06/2023 ID:  [name], DOB [birth year], MRN [id number] PCP: Melinda Rutherford, NP   HeartCare Providers Cardiologist:  Nona Dell, MD Electrophysiologist:  Lanier Prude, MD    History of Present Illness: .   [name] is a 83 y.o. female with a PMH of CAD, persistent A-fib, status post ablation in August 2023 (followed by EP), hypertension, asthma, COPD, GERD, hypothyroidism, rheumatoid arthritis/osteoarthritis, history of breast cancer, CKD, chronic pain syndrome, lumbar radiculopathy, and dependent leg edema, who presents today for scheduled follow-up.  Last seen by Dr. Diona Browner on December 30, 2022.  She was doing very well at that time.  05/14/2023 - Today she presents for follow-up.  She states she has not been doing well recently. Admits to recent DOE for the past 2 weeks, believes she is having issues with A-fib. She admits to recent swelling noted along her lower extremities bilaterally. BP is well-controlled at home even though BP is up today in the office.  She also admits to difficulty affording Xarelto, wants to know if she can have a more affordable medication option. Denies any chest pain, syncope, presyncope, dizziness, orthopnea, PND, significant weight changes, acute bleeding, or claudication.  06/23/2023 -presents today for follow-up.  Tells me she has not been doing well recently.  Her chief concerns are leg swelling that she noticed over the past weekend as well as nausea (denies any vomiting), and increased blood pressure.  She is unsure what is causing her high blood pressure as she has been reducing her salt intake.  Unsure what is causing her nausea as she denies any fever, chills, or any recent intolerance to certain meals. Denies any chest pain, shortness of breath, palpitations, syncope, presyncope, dizziness, orthopnea, PND, significant weight changes, acute bleeding, or claudication.  08/06/2023 -today she  presents for follow-up.  Doing some better in regards to leg swelling and denies any more nausea -she says her leg edema was noticed when legs were in dependent position, has had recent workup that showed lower extremity Doppler was negative for DVT.  Blood pressure has improved since last office visit, however she does admit to exertional chest tightness, noticed while going to get her mail.  She says when this happens, she experiences tightness where she has to sit down and rest, brief in duration, located along the center of her chest and denies any radiation.  She does admit to dyspnea on exertion with walking short distances.  Says she loves to exercise, but cannot do this like she wants to as she says she gives out easily and experiences shortness of breath, says has been going on for a while and seems to be a little worse recently.  ROS: Negative.  See HPI.  Studies Reviewed: Marland Kitchen    EKG: EKG Interpretation Date/Time:  Thursday August 06 2023 10:27:56 EDT Ventricular Rate:  64 PR Interval:  214 QRS Duration:  68 QT Interval:  400 QTC Calculation: 412 R Axis:   87  Text Interpretation: Sinus rhythm with 1st degree A-V block with Premature atrial complexes in a pattern of bigeminy When compared with ECG of 14-May-2023 11:27, PR interval has increased Confirmed by Sharlene Dory [phone]) on 08/07/2023 1:39:05 PM   Cardiac monitor 05/2023: ZIO monitor reviewed.  11 days, 2 hours analyzed.   Predominant rhythm is sinus with heart rate ranging from 45 bpm up to 114 bpm and average heart rate 66 bpm. There were frequent PACs representing 6.8%  total beats with otherwise occasional atrial couplets and triplets. There were rare PVCs including ventricular couplets representing less than 1% total beats. Multiple (2425) very brief episodes of PSVT were noted, the longest of which was only 17 beats.  There were no sustained arrhythmias.  Some of these episodes occurred with IVCD/aberrancy. There were no  pauses or high degree heart block.  Echocardiogram 05/2023: 1. Left ventricular ejection fraction, by estimation, is 60 to 65%. The  left ventricle has normal function. The left ventricle has no regional  wall motion abnormalities. There is mild left ventricular hypertrophy.  Left ventricular diastolic parameters  are consistent with Grade I diastolic dysfunction (impaired relaxation).  Elevated left ventricular end-diastolic pressure. The average left  ventricular global longitudinal strain is -21.1 %. The global longitudinal  strain is normal.   2. Right ventricular systolic function is normal. The right ventricular  size is normal.   3. The mitral valve is normal in structure. Trivial mitral valve  regurgitation. No evidence of mitral stenosis.   4. The aortic valve is tricuspid. Aortic valve regurgitation is not  visualized. No aortic stenosis is present.   5. The inferior vena cava is normal in size with greater than 50%  respiratory variability, suggesting right atrial pressure of 3 mmHg.   6. Cannot exclude a small PFO.   Comparison(s): No significant change from prior study.    Echocardiogram 10/2021:  1. No significant pericardial effusion.   2. Left ventricular ejection fraction, by estimation, is 60 to 65%. The  left ventricle has normal function. The left ventricle has no regional  wall motion abnormalities. Left ventricular diastolic parameters are  indeterminate.   3. Right ventricular systolic function is normal. The right ventricular  size is normal.   4. Left atrial size was mildly dilated.   5. The mitral valve is normal in structure. Mild mitral valve  regurgitation. No evidence of mitral stenosis.   6. The aortic valve is normal in structure. Aortic valve regurgitation is  not visualized. No aortic stenosis is present.   7. The inferior vena cava is normal in size with greater than 50%  respiratory variability, suggesting right atrial pressure of 3 mmHg.   CT  cardiac 09/2021:  IMPRESSION: 1. There is normal pulmonary vein drainage into the left atrium with ostial measurements above.   2. There is no thrombus in the left atrial appendage.   3. The esophagus runs in the left atrial midline and is not in proximity to any of the pulmonary vein ostia.   4. No PFO/ASD.   5. Normal coronary origin. Right dominance.   6. CAC score of 1362 Agatston units which is 93rd percentile for age-, race-, and sex-matched controls.  IMPRESSION: 1. Signs signs of pulmonary emphysema with potential interstitial lung disease. Correlate with respiratory symptoms and consider pulmonology follow-up with high-resolution chest CT as warranted. 2. Coronary artery disease and aortic atherosclerosis. 3. Post RIGHT mastectomy and breast reconstruction also with LEFT breast implant.  LHC 12/2019:  Mid RCA lesion is 40% stenosed. Prox RCA lesion is 20% stenosed. Dist Cx lesion is 80% stenosed. Prox Cx lesion is 50% stenosed. Mid LAD lesion is 20% stenosed.   1. Moderate stenosis mid Circumflex. This lesion is not flow limiting by functional assessment (DFR 0.96). The small caliber distal Circumflex has a severe stenosis but this vessel appears to be too small for PCI (1.75 mm vessel).  2. Mild plaque in the LAD 3. The RCA is a large  dominant vessel with mild to moderate calcified plaque in the mid and distal vessel.    Recommendations: Medical management of CAD. The stenosis in the mid Circumflex is moderate angiographically but by functional flow wire assessment is not flow limiting (DFR 0.96). The small caliber distal Circumflex has a severe stenosis but this vessel appears to be too small for PCI (1.75 mm vessel).   Lexiscan 12/2019:  No diagnostic ST segment changes to indicate ischemia. Occasional PACs. Small, mild intensity, apical anteroseptal defect that is partially reversible in the setting of breast attenuation, mild ischemia not excluded. TID ratio  increased at 1.31 so cannot exclude the possibility of more diffuse subendocardial ischemia. This is a high risk study due to increased TID ratio. Nuclear stress EF: 66%.  Physical Exam:   VS:  BP 138/70 (BP Location: Left Arm, Cuff Size: Normal)   Pulse 66   Ht 5' 5.5" (1.664 m)   Wt 162 lb 6.4 oz (73.7 kg)   SpO2 97%   BMI 26.61 kg/m    Wt Readings from Last 3 Encounters:  08/06/23 162 lb 6.4 oz (73.7 kg)  06/23/23 166 lb (75.3 kg)  05/14/23 152 lb (68.9 kg)     GEN: Well nourished, well developed in no acute distress NECK: No JVD; No carotid bruits CARDIAC: S1/S2, irregular rhythm and regular rate, no murmurs, rubs, gallops RESPIRATORY:  Clear to auscultation without rales, wheezing or rhonchi  ABDOMEN: Soft, non-tender, non-distended EXTREMITIES:  No edema; No deformity   ASSESSMENT AND PLAN: .     Exertional chest pain, DOE, CAD Admits to exertional chest pain and DOE - see HPI. Wants to know if she has a blockage. No acute ischemic changes on EKG today. Discussed NST including risks and benefits, and she verbalized understanding and is agreeable to proceed.  Will arrange test.  Not on aspirin due to being on OAC.  Will refill nitroglycerin.  Did discuss medication adjustment including possibly adding Imdur, patient declines at this time.  Can consider this if needed in the future.  Continue current medication regimen.  Heart healthy diet encouraged.  Care and ED precautions discussed.   Informed Consent   Shared Decision Making/Informed Consent The risks [chest pain, shortness of breath, cardiac arrhythmias, dizziness, blood pressure fluctuations, myocardial infarction, stroke/transient ischemic attack, nausea, vomiting, allergic reaction, radiation exposure, metallic taste sensation and life-threatening complications (estimated to be 1 in 10,000)], benefits (risk stratification, diagnosing coronary artery disease, treatment guidance) and alternatives of a nuclear stress test  were discussed in detail with Ms. Staunton and she agrees to proceed.     A-fib, s/p ablation in 11/2021, long term use of OAC, frequent PAC's Denies any tachycardia or palpitations.  Heart rate is well-controlled today, however has frequent PAC's in form of atrial bigeminy as noted on EKG today. See recent monitor report noted above.  Continue current medication regimen as she is asymptomatic.  Compliant and tolerating well and denies any bleeding issues.  She is on appropriate dosage.  Hypertension Blood pressure stable and at goal. Discussed goal SBP < 140. BP well-controlled at home. Discussed to monitor BP at home at least 2 hours after medications and sitting for 5-10 minutes. Heart healthy diet and regular cardiovascular exercise encouraged.  No medication changes at this time.   Dispo: Follow-up with me/APP in 4-6 weeks or sooner if anything changes.  Signed, Sharlene Dory, NP

## 2023-08-12 ENCOUNTER — Ambulatory Visit: Admitting: Gastroenterology

## 2023-08-14 ENCOUNTER — Ambulatory Visit (HOSPITAL_COMMUNITY)
Admission: RE | Admit: 2023-08-14 | Discharge: 2023-08-14 | Disposition: A | Discharge status: Home / Self Care | Source: Ambulatory Visit | Attending: Adult Health Nurse Practitioner | Admitting: Adult Health Nurse Practitioner

## 2023-08-14 ENCOUNTER — Encounter (HOSPITAL_COMMUNITY): Payer: Self-pay

## 2023-08-14 ENCOUNTER — Encounter (HOSPITAL_COMMUNITY)
Admission: RE | Admit: 2023-08-14 | Discharge: 2023-08-14 | Disposition: A | Discharge status: Home / Self Care | Source: Ambulatory Visit | Attending: Nurse Practitioner | Admitting: Nurse Practitioner

## 2023-08-14 DIAGNOSIS — R079 Chest pain, unspecified: Secondary | ICD-10-CM | POA: Diagnosis present

## 2023-08-14 LAB — NM MYOCAR MULTI W/SPECT W/WALL MOTION / EF
Base ST Depression (mm): 0 mm
Estimated workload: 1
Exercise duration (min): 0 min
Exercise duration (sec): 0 s
LV dias vol: 72 mL (ref 46–106)
LV sys vol: 26 mL
MPHR: 137 {beats}/min
Nuc Stress EF: 64 %
Peak HR: 87 {beats}/min
Percent HR: 63 %
RATE: 0.6
Rest HR: 62 {beats}/min
Rest Nuclear Isotope Dose: 10.5 mCi
SDS: 0
SRS: 0
SSS: 0
ST Depression (mm): 0 mm
Stress Nuclear Isotope Dose: 30.2 mCi
TID: 1.09

## 2023-08-14 MED ORDER — SODIUM CHLORIDE FLUSH 0.9 % IV SOLN
INTRAVENOUS | Status: AC
Start: 2023-08-14 — End: 2023-08-14
  Filled 2023-08-14: qty 10

## 2023-08-14 MED ORDER — REGADENOSON 0.4 MG/5ML IV SOLN
INTRAVENOUS | Status: AC
Start: 2023-08-14 — End: 2023-08-14
  Filled 2023-08-14: qty 5

## 2023-08-14 MED ORDER — TECHNETIUM TC 99M TETROFOSMIN IV KIT
30.0000 | PACK | Freq: Once | INTRAVENOUS | Status: AC | PRN
Start: 2023-08-14 — End: 2023-08-14
  Administered 2023-08-14: 30.2 via INTRAVENOUS

## 2023-08-14 MED ORDER — TECHNETIUM TC 99M TETROFOSMIN IV KIT
10.0000 | PACK | Freq: Once | INTRAVENOUS | Status: AC | PRN
  Administered 2023-08-14: 10.5 via INTRAVENOUS

## 2023-09-07 ENCOUNTER — Ambulatory Visit: Admitting: Gastroenterology

## 2023-09-08 ENCOUNTER — Ambulatory Visit: Payer: Self-pay | Admitting: Cardiology

## 2023-09-09 ENCOUNTER — Other Ambulatory Visit: Payer: Self-pay | Admitting: Cardiology

## 2023-09-09 ENCOUNTER — Encounter: Payer: Self-pay | Admitting: Cardiology

## 2023-09-09 ENCOUNTER — Ambulatory Visit: Attending: Cardiology | Admitting: Cardiology

## 2023-09-09 VITALS — BP 142/82 | HR 65 | Ht 65.0 in | Wt 163.4 lb

## 2023-09-09 DIAGNOSIS — E782 Mixed hyperlipidemia: Secondary | ICD-10-CM | POA: Diagnosis not present

## 2023-09-09 DIAGNOSIS — I4819 Other persistent atrial fibrillation: Secondary | ICD-10-CM

## 2023-09-09 DIAGNOSIS — I200 Unstable angina: Secondary | ICD-10-CM

## 2023-09-09 DIAGNOSIS — I25119 Atherosclerotic heart disease of native coronary artery with unspecified angina pectoris: Secondary | ICD-10-CM | POA: Diagnosis not present

## 2023-09-09 DIAGNOSIS — I10 Essential (primary) hypertension: Secondary | ICD-10-CM

## 2023-09-09 DIAGNOSIS — Z01818 Encounter for other preprocedural examination: Secondary | ICD-10-CM

## 2023-09-09 LAB — CBC
Hematocrit: 34.8 % (ref 34.0–46.6)
Hemoglobin: 11.9 g/dL (ref 11.1–15.9)
MCH: 32.5 pg (ref 26.6–33.0)
MCHC: 34.2 g/dL (ref 31.5–35.7)
MCV: 95 fL (ref 79–97)
Platelets: 339 10*3/uL (ref 150–450)
RBC: 3.66 x10E6/uL — ABNORMAL LOW (ref 3.77–5.28)
RDW: 13.1 % (ref 11.7–15.4)
WBC: 9.8 10*3/uL (ref 3.4–10.8)

## 2023-09-09 LAB — BASIC METABOLIC PANEL WITH GFR
BUN/Creatinine Ratio: 20 (ref 12–28)
BUN: 21 mg/dL (ref 8–27)
CO2: 26 mmol/L (ref 20–29)
Calcium: 9.8 mg/dL (ref 8.7–10.3)
Chloride: 101 mmol/L (ref 96–106)
Creatinine, Ser: 1.04 mg/dL — ABNORMAL HIGH (ref 0.57–1.00)
Glucose: 91 mg/dL (ref 70–99)
Potassium: 4.4 mmol/L (ref 3.5–5.2)
Sodium: 138 mmol/L (ref 134–144)
eGFR: 53 mL/min/{1.73_m2} — ABNORMAL LOW (ref >59)

## 2023-09-09 MED ORDER — ROSUVASTATIN CALCIUM 5 MG PO TABS: 5.0000 mg | ORAL_TABLET | Freq: Every day | ORAL | 3 refills | Status: AC

## 2023-09-09 NOTE — Patient Instructions (Signed)
  [facility] A DEPT OF [place]. [facility] [place] [address] [place] [address] Dept: [phone] Loc: [phone]  [name]  09/09/2023  You are scheduled for a Cardiac Catheterization on Tuesday, May 20 with Dr. Antoinette Batman.  1. Please arrive at the [facility] (Main Entrance A) at [facility]: [address] [place], [place] [address] at 7:00 AM (This time is 2 hour(s) before your procedure to ensure your preparation).   Free valet parking service is available. You will check in at ADMITTING. The support person will be asked to wait in the waiting room.  It is OK to have someone drop you off and come back when you are ready to be discharged.    Special note: Every effort is made to have your procedure done on time. Please understand that emergencies sometimes delay scheduled procedures.  2. Diet: Do not eat solid foods after midnight.  The patient may have clear liquids until 5am upon the day of the procedure.  3. Labs: You will need to have blood drawn today at [facility] in [place]  4. Medication instructions in preparation for your procedure:   Contrast Allergy: No  HOLD Xarelto  48 hours before cath, HOLD starting Sunday 09/13/23  HOLD Lasix  the morning of cath  Re-start Crestor  5 mg daily at bedtime  On the morning of your procedure, take your Aspirin  81 mg and any morning medicines NOT listed above.  You may use sips of water .  5. Plan to go home the same day, you will only stay overnight if medically necessary. 6. Bring a current list of your medications and current insurance cards. 7. You MUST have a responsible person to drive you home. 8. Someone MUST be with you the first 24 hours after you arrive home or your discharge will be delayed. 9. Please wear clothes that are easy to get on and off and wear slip-on shoes.  Thank you for allowing us  to care for you!   -- [facility]

## 2023-09-09 NOTE — Progress Notes (Signed)
 Cardiology Office Note  Date: 09/09/2023   ID: [name] August 06, 1940, MRN [id number]  History of Present Illness: [name] is an 83 y.o. female last seen in February by Ms. Melinda Cutting NP, I reviewed her note.  Our last visit was in September 2024.  She is here today for follow-up to discuss interval coronary calcium  score obtained by her PCP.    Tells me that if she was seen recently by her PCP complaining of exertional shortness of breath and sense of palpitations, also fatigue.  Symptoms started sometime within the last few weeks.  She states that this feels different from prior atrial fibrillation, and so far she has not had any documented arrhythmia in the setting of her symptoms.  She was found to have a coronary calcium  score of 2254 with calcification involving all coronaries, predominantly LAD and RCA.  She already has a history of known CAD based on cardiac catheterization in 2021.  At that time she had mild to moderate multivessel disease with the exception of 80% distal circumflex stenosis that was managed medically as it was felt to be too small for PCI.  We went over her medications.  She tells that she was taken off Crestor  somewhere along the way, did not have any trouble tolerating it however and her LDL had been much better controlled previously in the 50s.  LDL was up to 102 in January of this year.  Today we discussed her symptoms.  The recent coronary calcium  score is not helpful in terms of determining whether she has obstructive CAD as an etiology however.  We discussed the risks and benefits of a follow-up diagnostic cardiac catheterization and she is in agreement to proceed.  Physical Exam: VS:  BP (!) 142/82 (BP Location: Left Arm)   Pulse 65   Ht 5\' 5"  (1.651 m)   Wt 163 lb 6.4 oz (74.1 kg)   SpO2 95%   BMI 27.19 kg/m , BMI Body mass index is 27.19 kg/m.  Wt Readings from Last 3 Encounters:  09/09/23 163 lb 6.4 oz (74.1 kg)  08/06/23 162 lb 6.4 oz  (73.7 kg)  06/23/23 166 lb (75.3 kg)    General: Patient appears comfortable at rest. HEENT: Conjunctiva and lids normal. Neck: Supple, no elevated JVP or carotid bruits. Lungs: Clear to auscultation, nonlabored breathing at rest. Cardiac: Regular rate and rhythm with ectopy, no S3, 1/6 systolic murmur, no pericardial rub. Abdomen: Soft, nontender, bowel sounds present. Extremities: No pitting edema.  ECG:  An ECG dated 08/06/2023 was personally reviewed today and demonstrated:  Sinus rhythm with prolonged PR interval and atrial bigeminy.  Labwork: January 2025: Cholesterol 184, triglycerides 112, HDL 62, LDL 102 06/23/2023: BNP 82.7; BUN 25; Creatinine, Ser 1.05; Magnesium 2.2; Potassium 4.3; Sodium 141 07/20/2023: Hemoglobin 11.5; Platelets 259   Other Studies Reviewed Today:  Echocardiogram 06/02/2023:  1. Left ventricular ejection fraction, by estimation, is 60 to 65%. The  left ventricle has normal function. The left ventricle has no regional  wall motion abnormalities. There is mild left ventricular hypertrophy.  Left ventricular diastolic parameters  are consistent with Grade I diastolic dysfunction (impaired relaxation).  Elevated left ventricular end-diastolic pressure. The average left  ventricular global longitudinal strain is -21.1 %. The global longitudinal  strain is normal.   2. Right ventricular systolic function is normal. The right ventricular  size is normal.   3. The mitral valve is normal in structure. Trivial mitral valve  regurgitation. No  evidence of mitral stenosis.   4. The aortic valve is tricuspid. Aortic valve regurgitation is not  visualized. No aortic stenosis is present.   5. The inferior vena cava is normal in size with greater than 50%  respiratory variability, suggesting right atrial pressure of 3 mmHg.   6. Cannot exclude a small PFO.   Assessment and Plan:  1.  Recent onset exertional shortness of breath and sense of palpitations and fatigue,  noted within the last few weeks.  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  PCP obtained a coronary calcium  score recently of 2254 with multivessel coronary artery calcification, predominantly LAD and RCA distribution.  Although consistent with high cardiovascular event risk, this does not provide any information regarding progressive obstructive disease that may be contributing to symptoms.  We discussed the risks and benefits of a diagnostic cardiac catheterization and she is in agreement to proceed.  Obtain baseline lab work, she will need to hold Xarelto  48 hours prior.  We are also starting her back on statin therapy.  2.  Persistent atrial fibrillation with CHA2DS2-VASc score of 4.  She is status post successful atrial fibrillation ablation in August 2023.  She states that her recent symptoms are not consistent with prior atrial fibrillation, she has had some atrial and ventricular ectopy but no definitive atrial fibrillation during recent symptoms.  At baseline she is on Xarelto  15 mg daily and Toprol -XL 50 mg daily.   3.  Primary hypertension.  Continue Cozaar  50 mg daily.   4.  Mixed hyperlipidemia.  LDL 102 in January.  Resume Crestor  5 mg daily, on that dose she had an LDL in the 50s previously.  Disposition:  Follow up after procedure.  Signed, [name], M.D., F.A.C.C. [place] [facility]

## 2023-09-09 NOTE — H&P (View-Only) (Signed)
 Cardiology Office Note  Date: 09/09/2023   ID: [name] [name], MRN [id number]  History of Present Illness: [name] is an 83 y.o. female last seen in February by Ms. Clementine Cutting NP, I reviewed her note.  Our last visit was in September 2024.  She is here today for follow-up to discuss interval coronary calcium  score obtained by her PCP.    Tells me that if she was seen recently by her PCP complaining of exertional shortness of breath and sense of palpitations, also fatigue.  Symptoms started sometime within the last few weeks.  She states that this feels different from prior atrial fibrillation, and so far she has not had any documented arrhythmia in the setting of her symptoms.  She was found to have a coronary calcium  score of 2254 with calcification involving all coronaries, predominantly LAD and RCA.  She already has a history of known CAD based on cardiac catheterization in 2021.  At that time she had mild to moderate multivessel disease with the exception of 80% distal circumflex stenosis that was managed medically as it was felt to be too small for PCI.  We went over her medications.  She tells that she was taken off Crestor  somewhere along the way, did not have any trouble tolerating it however and her LDL had been much better controlled previously in the 50s.  LDL was up to 102 in January of this year.  Today we discussed her symptoms.  The recent coronary calcium  score is not helpful in terms of determining whether she has obstructive CAD as an etiology however.  We discussed the risks and benefits of a follow-up diagnostic cardiac catheterization and she is in agreement to proceed.  Physical Exam: VS:  BP (!) 142/82 (BP Location: Left Arm)   Pulse 65   Ht 5\' 5"  (1.651 m)   Wt 163 lb 6.4 oz (74.1 kg)   SpO2 95%   BMI 27.19 kg/m , BMI Body mass index is 27.19 kg/m.  Wt Readings from Last 3 Encounters:  09/09/23 163 lb 6.4 oz (74.1 kg)  08/06/23 162 lb 6.4 oz  (73.7 kg)  06/23/23 166 lb (75.3 kg)    General: Patient appears comfortable at rest. HEENT: Conjunctiva and lids normal. Neck: Supple, no elevated JVP or carotid bruits. Lungs: Clear to auscultation, nonlabored breathing at rest. Cardiac: Regular rate and rhythm with ectopy, no S3, 1/6 systolic murmur, no pericardial rub. Abdomen: Soft, nontender, bowel sounds present. Extremities: No pitting edema.  ECG:  An ECG dated 08/06/2023 was personally reviewed today and demonstrated:  Sinus rhythm with prolonged PR interval and atrial bigeminy.  Labwork: January 2025: Cholesterol 184, triglycerides 112, HDL 62, LDL 102 06/23/2023: BNP 82.7; BUN 25; Creatinine, Ser 1.05; Magnesium 2.2; Potassium 4.3; Sodium 141 07/20/2023: Hemoglobin 11.5; Platelets 259   Other Studies Reviewed Today:  Echocardiogram 06/02/2023:  1. Left ventricular ejection fraction, by estimation, is 60 to 65%. The  left ventricle has normal function. The left ventricle has no regional  wall motion abnormalities. There is mild left ventricular hypertrophy.  Left ventricular diastolic parameters  are consistent with Grade I diastolic dysfunction (impaired relaxation).  Elevated left ventricular end-diastolic pressure. The average left  ventricular global longitudinal strain is -21.1 %. The global longitudinal  strain is normal.   2. Right ventricular systolic function is normal. The right ventricular  size is normal.   3. The mitral valve is normal in structure. Trivial mitral valve  regurgitation. No  evidence of mitral stenosis.   4. The aortic valve is tricuspid. Aortic valve regurgitation is not  visualized. No aortic stenosis is present.   5. The inferior vena cava is normal in size with greater than 50%  respiratory variability, suggesting right atrial pressure of 3 mmHg.   6. Cannot exclude a small PFO.   Assessment and Plan:  1.  Recent onset exertional shortness of breath and sense of palpitations and fatigue,  noted within the last few weeks.  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  PCP obtained a coronary calcium  score recently of 2254 with multivessel coronary artery calcification, predominantly LAD and RCA distribution.  Although consistent with high cardiovascular event risk, this does not provide any information regarding progressive obstructive disease that may be contributing to symptoms.  We discussed the risks and benefits of a diagnostic cardiac catheterization and she is in agreement to proceed.  Obtain baseline lab work, she will need to hold Xarelto  48 hours prior.  We are also starting her back on statin therapy.  2.  Persistent atrial fibrillation with CHA2DS2-VASc score of 4.  She is status post successful atrial fibrillation ablation in August 2023.  She states that her recent symptoms are not consistent with prior atrial fibrillation, she has had some atrial and ventricular ectopy but no definitive atrial fibrillation during recent symptoms.  At baseline she is on Xarelto  15 mg daily and Toprol -XL 50 mg daily.   3.  Primary hypertension.  Continue Cozaar  50 mg daily.   4.  Mixed hyperlipidemia.  LDL 102 in January.  Resume Crestor  5 mg daily, on that dose she had an LDL in the 50s previously.  Disposition:  Follow up after procedure.  Signed, Gerard Knight, M.D., F.A.C.C. [facility]

## 2023-09-14 ENCOUNTER — Telehealth: Payer: Self-pay | Admitting: *Deleted

## 2023-09-14 NOTE — Telephone Encounter (Signed)
 Cardiac Catheterization scheduled at [facility] for: Tuesday Sep 15, 2023 9 AM Arrival time [facility] Main Entrance A at: 7 AM  Nothing to eat after midnight prior to procedure, clear liquids until 5 AM day of procedure.  Medication instructions: -Hold:  Xarelto -none 09/13/23 until post procedure  Losartan /Lasix -day before and day of procedure-per protocol GFR < 60 (53) -Other usual morning medications can be taken with sips of water  including aspirin  81 mg.  Plan to go home the same day, you will only stay overnight if medically necessary.  You must have responsible adult to drive you home.  Someone must be with you the first 24 hours after you arrive home.  Reviewed procedure instructions with patient.

## 2023-09-15 ENCOUNTER — Ambulatory Visit (HOSPITAL_COMMUNITY)
Admission: RE | Admit: 2023-09-15 | Discharge: 2023-09-15 | Disposition: A | Discharge status: Home / Self Care | Source: Ambulatory Visit | Attending: Cardiology | Admitting: Cardiology

## 2023-09-15 ENCOUNTER — Other Ambulatory Visit: Payer: Self-pay

## 2023-09-15 ENCOUNTER — Encounter (HOSPITAL_COMMUNITY)
Admission: RE | Disposition: A | Discharge status: Home / Self Care | Payer: Self-pay | Source: Ambulatory Visit | Attending: Cardiology

## 2023-09-15 ENCOUNTER — Other Ambulatory Visit: Payer: Self-pay | Admitting: Orthopedic Surgery

## 2023-09-15 ENCOUNTER — Encounter (HOSPITAL_COMMUNITY): Payer: Self-pay | Admitting: Cardiology

## 2023-09-15 DIAGNOSIS — Z7901 Long term (current) use of anticoagulants: Secondary | ICD-10-CM | POA: Diagnosis not present

## 2023-09-15 DIAGNOSIS — Z79899 Other long term (current) drug therapy: Secondary | ICD-10-CM | POA: Diagnosis not present

## 2023-09-15 DIAGNOSIS — Z7982 Long term (current) use of aspirin: Secondary | ICD-10-CM | POA: Insufficient documentation

## 2023-09-15 DIAGNOSIS — I2584 Coronary atherosclerosis due to calcified coronary lesion: Secondary | ICD-10-CM | POA: Insufficient documentation

## 2023-09-15 DIAGNOSIS — M542 Cervicalgia: Secondary | ICD-10-CM

## 2023-09-15 DIAGNOSIS — I25110 Atherosclerotic heart disease of native coronary artery with unstable angina pectoris: Secondary | ICD-10-CM | POA: Diagnosis present

## 2023-09-15 DIAGNOSIS — I10 Essential (primary) hypertension: Secondary | ICD-10-CM | POA: Diagnosis not present

## 2023-09-15 DIAGNOSIS — I4819 Other persistent atrial fibrillation: Secondary | ICD-10-CM | POA: Insufficient documentation

## 2023-09-15 DIAGNOSIS — I200 Unstable angina: Secondary | ICD-10-CM

## 2023-09-15 DIAGNOSIS — E782 Mixed hyperlipidemia: Secondary | ICD-10-CM | POA: Insufficient documentation

## 2023-09-15 HISTORY — PX: LEFT HEART CATH AND CORONARY ANGIOGRAPHY: CATH118249

## 2023-09-15 SURGERY — LEFT HEART CATH AND CORONARY ANGIOGRAPHY
Anesthesia: LOCAL

## 2023-09-15 MED ORDER — ACETAMINOPHEN 325 MG PO TABS: 650.0000 mg | ORAL_TABLET | ORAL | Status: DC | PRN

## 2023-09-15 MED ORDER — HYDRALAZINE HCL 20 MG/ML IJ SOLN: 10.0000 mg | INTRAMUSCULAR | Status: DC | PRN

## 2023-09-15 MED ORDER — HEPARIN (PORCINE) IN NACL 1000-0.9 UT/500ML-% IV SOLN
INTRAVENOUS | Status: DC | PRN
  Administered 2023-09-15 (×2): 500 mL

## 2023-09-15 MED ORDER — SODIUM CHLORIDE 0.9% FLUSH: 3.0000 mL | Freq: Two times a day (BID) | INTRAVENOUS | Status: DC

## 2023-09-15 MED ORDER — MIDAZOLAM HCL 2 MG/2ML IJ SOLN
INTRAMUSCULAR | Status: DC | PRN
  Administered 2023-09-15: 1 mg via INTRAVENOUS

## 2023-09-15 MED ORDER — FENTANYL CITRATE (PF) 100 MCG/2ML IJ SOLN
INTRAMUSCULAR | Status: AC
Start: 2023-09-15 — End: ?
  Filled 2023-09-15: qty 2

## 2023-09-15 MED ORDER — SODIUM CHLORIDE 0.9 % WEIGHT BASED INFUSION
3.0000 mL/kg/h | INTRAVENOUS | Status: AC
Start: 2023-09-15 — End: 2023-09-15
  Administered 2023-09-15: 3 mL/kg/h via INTRAVENOUS

## 2023-09-15 MED ORDER — MIDAZOLAM HCL 2 MG/2ML IJ SOLN
INTRAMUSCULAR | Status: AC
Start: 2023-09-15 — End: ?
  Filled 2023-09-15: qty 2

## 2023-09-15 MED ORDER — ONDANSETRON HCL 4 MG/2ML IJ SOLN: 4.0000 mg | Freq: Four times a day (QID) | INTRAMUSCULAR | Status: DC | PRN

## 2023-09-15 MED ORDER — SODIUM CHLORIDE 0.9% FLUSH: 3.0000 mL | INTRAVENOUS | Status: DC | PRN

## 2023-09-15 MED ORDER — LIDOCAINE HCL (PF) 1 % IJ SOLN
INTRAMUSCULAR | Status: AC
  Filled 2023-09-15: qty 30

## 2023-09-15 MED ORDER — IOHEXOL 350 MG/ML SOLN
INTRAVENOUS | Status: DC | PRN
  Administered 2023-09-15: 46 mL

## 2023-09-15 MED ORDER — SODIUM CHLORIDE 0.9 % WEIGHT BASED INFUSION: 1.0000 mL/kg/h | INTRAVENOUS | Status: DC

## 2023-09-15 MED ORDER — VERAPAMIL HCL 2.5 MG/ML IV SOLN
INTRAVENOUS | Status: AC
  Filled 2023-09-15: qty 2

## 2023-09-15 MED ORDER — HEPARIN SODIUM (PORCINE) 1000 UNIT/ML IJ SOLN
INTRAMUSCULAR | Status: DC | PRN
  Administered 2023-09-15: 3500 [IU] via INTRAVENOUS

## 2023-09-15 MED ORDER — VERAPAMIL HCL 2.5 MG/ML IV SOLN
INTRAVENOUS | Status: DC | PRN
  Administered 2023-09-15: 10 mL via INTRA_ARTERIAL

## 2023-09-15 MED ORDER — LIDOCAINE HCL (PF) 1 % IJ SOLN
INTRAMUSCULAR | Status: DC | PRN
Start: 2023-09-15 — End: 2023-09-15
  Administered 2023-09-15 (×2): 5 mL

## 2023-09-15 MED ORDER — ASPIRIN 81 MG PO CHEW: 81.0000 mg | CHEWABLE_TABLET | ORAL | Status: DC

## 2023-09-15 MED ORDER — FENTANYL CITRATE (PF) 100 MCG/2ML IJ SOLN
INTRAMUSCULAR | Status: DC | PRN
  Administered 2023-09-15 (×2): 25 ug via INTRAVENOUS

## 2023-09-15 MED ORDER — HEPARIN SODIUM (PORCINE) 1000 UNIT/ML IJ SOLN
INTRAMUSCULAR | Status: AC
  Filled 2023-09-15: qty 10

## 2023-09-15 MED ORDER — LABETALOL HCL 5 MG/ML IV SOLN
10.0000 mg | INTRAVENOUS | Status: DC | PRN
  Administered 2023-09-15: 10 mg via INTRAVENOUS
  Filled 2023-09-15: qty 4

## 2023-09-15 SURGICAL SUPPLY — 9 items
CATH INFINITI AMBI 5FR TG (CATHETERS) IMPLANT
COVER PRB 48X5XTLSCP FOLD TPE (BAG) IMPLANT
DEVICE RAD COMP TR BAND LRG (VASCULAR PRODUCTS) IMPLANT
DEVICE RAD TR BAND REGULAR (VASCULAR PRODUCTS) IMPLANT
GLIDESHEATH SLEND SS 6F .021 (SHEATH) IMPLANT
GUIDEWIRE INQWIRE 1.5J.035X260 (WIRE) IMPLANT
PACK CARDIAC CATHETERIZATION (CUSTOM PROCEDURE TRAY) ×1 IMPLANT
SET ATX-X65L (MISCELLANEOUS) IMPLANT
WIRE MICROINTRODUCER 60CM (WIRE) IMPLANT

## 2023-09-15 NOTE — Interval H&P Note (Signed)
 History and Physical Interval Note:  09/15/2023 8:29 AM  [name]  has presented today for surgery, with the diagnosis of Excellerating Angina.    The various methods of treatment have been discussed with the patient and family. After consideration of risks, benefits and other options for treatment, the patient has consented to  Procedure(s): LEFT HEART CATH AND CORONARY ANGIOGRAPHY (N/A)  PERCUTANEOUS CORONARY INTERVENTION  as a surgical intervention.  The patient's history has been reviewed, patient examined, no change in status, stable for surgery.  I have reviewed the patient's chart and labs.  Questions were answered to the patient's satisfaction.    Cath Lab Visit (complete for each Cath Lab visit)  Clinical Evaluation Leading to the Procedure:   ACS: No.  Non-ACS:    Anginal Classification: CCS III  Anti-ischemic medical therapy: Minimal Therapy (1 class of medications)  Non-Invasive Test Results: Equivocal test results  Prior CABG: No previous CABG     Randene Bustard

## 2023-09-15 NOTE — Discharge Instructions (Signed)
 Ulnar Site Care  This sheet gives you information about how to care for yourself after your procedure. Your health care provider may also give you more specific instructions. If you have problems or questions, contact your health care provider. What can I expect after the procedure? After the procedure, it is common to have: Bruising and tenderness at the catheter insertion area. Follow these instructions at home: Medicines Take over-the-counter and prescription medicines only as told by your health care provider. Insertion site care Follow instructions from your health care provider about how to take care of your insertion site. Make sure you: Wash your hands with soap and water  before you remove your bandage (dressing). If soap and water  are not available, use hand sanitizer. May remove dressing in 24 hours. Check your insertion site every day for signs of infection. Check for: Redness, swelling, or pain. Fluid or blood. Pus or a bad smell. Warmth. Do no take baths, swim, or use a hot tub for 5 days. You may shower 24-48 hours after the procedure. Remove the dressing and gently wash the site with plain soap and water . Pat the area dry with a clean towel. Do not rub the site. That could cause bleeding. Do not apply powder or lotion to the site. Activity  For 24 hours after the procedure, or as directed by your health care provider: Do not flex or bend the affected arm. Do not push or pull heavy objects with the affected arm. Do not drive yourself home from the hospital or clinic. You may drive 24 hours after the procedure. Do not operate machinery or power tools. KEEP ARM ELEVATED THE REMAINDER OF THE DAY. Do not push, pull or lift anything that is heavier than 10 lb for 5 days. Ask your health care provider when it is okay to: Return to work or school. Resume usual physical activities or sports. Resume sexual activity. General instructions If the catheter site starts to  bleed, raise your arm and put firm pressure on the site. If the bleeding does not stop, get help right away. This is a medical emergency. DRINK PLENTY OF FLUIDS FOR THE NEXT 2-3 DAYS. No alcohol consumption for 24 hours after receiving sedation. If you went home on the same day as your procedure, a responsible adult should be with you for the first 24 hours after you arrive home. Keep all follow-up visits as told by your health care provider. This is important. Contact a health care provider if: You have a fever. You have redness, swelling, or yellow drainage around your insertion site. Get help right away if: You have unusual pain at the radial site. The catheter insertion area swells very fast. The insertion area is bleeding, and the bleeding does not stop when you hold steady pressure on the area. Your arm or hand becomes pale, cool, tingly, or numb. These symptoms may represent a serious problem that is an emergency. Do not wait to see if the symptoms will go away. Get medical help right away. Call your local emergency services (911 in the [place] Do not drive yourself to the hospital. Summary After the procedure, it is common to have bruising and tenderness at the site. Follow instructions from your health care provider about how to take care of your radial site wound. Check the wound every day for signs of infection.  This information is not intended to replace advice given to you by your health care provider. Make sure you discuss any questions you have with  your health care provider. Document Revised: 05/20/2017 Document Reviewed: 05/20/2017 Elsevier Patient Education  2020 [employer].

## 2023-09-15 NOTE — Brief Op Note (Signed)
   Brief Cardiac Catheterization Report.  09/15/2023 9:40 AM  ID: [name], DOB 1940-12-25, MRN [id number]   PCP: Lorre Rosin, NP Primary Cardiologist: Teddie Favre, MD   SURGEON:  Surgeons and Role:     * Arleen Lacer, MD - Primary  PROCEDURE:  Procedure(s): LEFT HEART CATH AND CORONARY ANGIOGRAPHY (N/A)  PATIENT:  Melinda MACLELLAN  83 y.o. female with history of moderate CAD by cardiac catheterization in September 2021 (with significant elevated Coronary Calcium  Score of 2250), persistent A-fib, HTN HLD referred for cardiac catheterization for symptoms concerning for progressive angina with worsening symptoms of exertional dyspnea and chest discomfort over the last several months.  PRE-OPERATIVE DIAGNOSIS:  Excellerating Angina  POST-OPERATIVE DIAGNOSIS:   Nonobstructive coronary disease-relatively stable compared to 2021 with exception of progression of disease in the mid to distal RCA from 20% to 50%, stable proximal LCx 50% stenosis with ostium of AV groove circumflex 60% (previously read as 80%). Normal LVEDP  PROCEDURE PERFORMED Time Out: Verified patient identification, verified procedure, site/side was marked, verified correct patient position, special equipment/implants available, medications/allergies/relevent history reviewed, required imaging and test results available. Performed.  Access:  RIGHT ULNAR Artery: 6 Fr sheath -- Seldinger technique using Micropuncture Kit -- Direct ultrasound guidance used.  Permanent image obtained and placed on chart.  The right Radial artery was diminutive. -- 10 mL radial cocktail IA; 3500 Units IV Heparin   Left Heart Catheterization: 5Fr TIG 4.0 catheter was advanced or exchanged over a J-wire under direct fluoroscopic guidance into the ascending aorta; the catheter was used to engage first the Left Coronary Artery followed by the Right Coronary Artery for selective coronary cineangiography.  Then with the assistance of the  guidewire the catheter was advanced across the aortic valve for measurement of left ventricular hemodynamics.  LV gram not performed as she has had a recent echocardiogram.  The catheter was pulled back across aortic valve for measurement of aortic valve gradient.  Review of initial angiography revealed: Relatively stable coronary disease with only modest progression of disease in the RCA but otherwise stable findings from 2021.  No culprit lesion to explain symptoms.  Preparations are made for medical management and assessment of non-macrovascular coronary ischemia.  Upon completion of Angiogaphy, the catheter was removed completely out of the body over a wire, without complication.  Radial sheath removed in the Cardiac Catheterization lab with TR Band placed for hemostasis.  TR Band: 0930  Hours; 11 mL air 70: Reverse Barbeau C  MEDICATIONS SQ Lidocaine  8 mL Radial Cocktail: 3 mg Verapmil in 10 mL NS Heparin : 3500 units  ANESTHESIA:   local and IV sedation; 8 mL SQ lidocaine ; 1 mg IV Versed  and 50 mg IV fentanyl   EBL:  <50 mL  COUNTS:  YES  PATIENT DISPOSITION:  PACU - hemodynamically stable.  DICTATION: .Note written in EPIC  PLAN OF CARE: Discharge to home after PACU    Randene Bustard, MD

## 2023-09-17 ENCOUNTER — Ambulatory Visit: Admitting: Nurse Practitioner

## 2023-09-25 ENCOUNTER — Ambulatory Visit
Admission: RE | Admit: 2023-09-25 | Discharge: 2023-09-25 | Disposition: A | Discharge status: Home / Self Care | Source: Ambulatory Visit | Attending: Orthopedic Surgery | Admitting: Orthopedic Surgery

## 2023-09-25 DIAGNOSIS — M542 Cervicalgia: Secondary | ICD-10-CM

## 2023-10-08 ENCOUNTER — Other Ambulatory Visit: Payer: Self-pay | Admitting: Orthopedic Surgery

## 2023-10-12 ENCOUNTER — Other Ambulatory Visit: Payer: Self-pay | Admitting: Nurse Practitioner

## 2023-10-12 NOTE — Progress Notes (Addendum)
 Cardiology Office Note    Date:  10/13/2023  ID:  Saydi, Kobel 06/13/1940, MRN [id number] Cardiologist: Teddie Favre, MD    History of Present Illness:    [name] is a 83 y.o. female with past medical history of CAD (s/p cath in 12/2019 showing nonobstructive CAD and medical management recommended), HTN, HLD, persistent atrial fibrillation and history of breast cancer (s/p right mastectomy) who presents to the office today for follow-up from her recent cardiac catheterization.   She was last examined by Dr. Londa Rival in 08/2023 and reported worsening dyspnea on exertion and fatigue. Recent echo had showed a preserved EF of 60-65% with no regional WMA and Grade 1 DD. She only had trivial MR. NST in 07/2023 had also been low-risk. Given her continued symptoms, a cardiac catheterization was recommended for definitive evaluation. She had also been off Crestor  and this was resumed at 5 mg daily.  Her cardiac catheterization was performed by Dr. Addie Holstein on 09/15/2023 and showed nonobstructive disease which was relatively stable compared to prior cardiac catheterization in 2021 with only progression of disease along the mid to distal RCA from 20 to 50%, stable 50% proximal LCx stenosis and stenosis along the AV groove appeared reduced as this was previously at 80% and now at 60%. Continued medical management was recommended.  In talking with the patient today, she reports still having dyspnea with minimal activity which has been occurring for several months. Says that her activity has been more limited given her worsening pain along her neck and arms and she questions if this is due to the way she has been walking. She also had a Chest CT in 08/2023 by review of Care Everywhere which was consistent with emphysema. She has been on Symbicort by her PCP but reports this has not helped with her symptoms thus far. She denies any specific orthopnea, PND or pitting edema. No recent exertional chest  pain. Does have occasional palpitations which occur at rest and can last for a few minutes and then resolve.   Studies Reviewed:   EKG: EKG is not ordered today.  Echocardiogram: 05/2023 IMPRESSIONS     1. Left ventricular ejection fraction, by estimation, is 60 to 65%. The  left ventricle has normal function. The left ventricle has no regional  wall motion abnormalities. There is mild left ventricular hypertrophy.  Left ventricular diastolic parameters  are consistent with Grade I diastolic dysfunction (impaired relaxation).  Elevated left ventricular end-diastolic pressure. The average left  ventricular global longitudinal strain is -21.1 %. The global longitudinal  strain is normal.   2. Right ventricular systolic function is normal. The right ventricular  size is normal.   3. The mitral valve is normal in structure. Trivial mitral valve  regurgitation. No evidence of mitral stenosis.   4. The aortic valve is tricuspid. Aortic valve regurgitation is not  visualized. No aortic stenosis is present.   5. The inferior vena cava is normal in size with greater than 50%  respiratory variability, suggesting right atrial pressure of 3 mmHg.   6. Cannot exclude a small PFO.   Comparison(s): No significant change from prior study.   Event Monitor: 05/2023 ZIO monitor reviewed.  11 days, 2 hours analyzed.   Predominant rhythm is sinus with heart rate ranging from 45 bpm up to 114 bpm and average heart rate 66 bpm. There were frequent PACs representing 6.8% total beats with otherwise occasional atrial couplets and triplets. There were rare PVCs including ventricular  couplets representing less than 1% total beats. Multiple (2425) very brief episodes of PSVT were noted, the longest of which was only 17 beats.  There were no sustained arrhythmias.  Some of these episodes occurred with IVCD/aberrancy. There were no pauses or high degree heart block.    Cardiac Catheterization: 08/2023    Mid RCA lesion is 60% stenosed. -Mild progression of disease; Prox RCA lesion is 20% stenosed.   Mid LAD lesion is 20% stenosed.   Prox Cx lesion is 50% stenosed. LPAV lesion is 60% stenosed (stable upon improved flow)   LV end diastolic pressure is normal.   There is no aortic valve stenosis.  POST-OPERATIVE DIAGNOSIS:   Nonobstructive coronary disease-relatively stable compared to 2021 with exception of progression of disease in the mid to distal RCA from 20% to 50%, stable proximal LCx 50% stenosis with ostium of AV groove circumflex 60% (previously read as 80%). Normal LVEDP   RECOMMENDATIONS   In the absence of any other complications or medical issues, we expect the patient to be ready for discharge from a cath perspective on 09/15/2023.   Recommend Aspirin  81mg  daily for moderate CAD.    Risk Assessment/Calculations:    CHA2DS2-VASc Score = 5   This indicates a 7.2% annual risk of stroke. The patient's score is based upon: CHF History: 0 HTN History: 1 Diabetes History: 0 Stroke History: 0 Vascular Disease History: 1 Age Score: 2 Gender Score: 1    HYPERTENSION CONTROL Vitals:   10/13/23 0906 10/13/23 0929  BP: (!) 162/82 (!) 142/80    The patient's blood pressure is elevated above target today.  In order to address the patient's elevated BP: A current anti-hypertensive medication was adjusted today.     Physical Exam:   VS:  BP (!) 142/80   Pulse 75   Ht 5' 5.5 (1.664 m)   Wt 163 lb 3.2 oz (74 kg)   SpO2 96%   BMI 26.74 kg/m    Wt Readings from Last 3 Encounters:  10/13/23 163 lb 3.2 oz (74 kg)  09/15/23 160 lb (72.6 kg)  09/09/23 163 lb 6.4 oz (74.1 kg)     GEN: Well nourished, well developed female appearing in no acute distress NECK: No JVD; No carotid bruits CARDIAC: RRR, no murmurs, rubs, gallops RESPIRATORY:  Clear to auscultation without rales, wheezing or rhonchi  ABDOMEN: Appears non-distended. No obvious abdominal masses. EXTREMITIES: No  clubbing or cyanosis. No pitting edema.  Distal pedal pulses are 2+ bilaterally. Radial site without ecchymosis or evidence of a hematoma.    Assessment and Plan:   1. Coronary artery disease involving native coronary artery of native heart with angina pectoris [facility]) - Recent cardiac catheterization last month showed nonobstructive disease as outlined above which is overall stable compared to prior imaging from 2021. She does have baseline dyspnea on exertion which is possibly due to deconditioning or emphysema as noted on recent Chest CT. No recent chest pain. - She is not on ASA given the need for anticoagulation. Continue Toprol -XL and Crestor  5 mg daily.  2. Persistent atrial fibrillation (HCC) - She reports still having frequent palpitations and recent monitor in 05/2023 showed predominantly normal sinus rhythm but she did have very frequent episodes of SVT but the longest only lasted 17 beats.  Reviewed options with the patient and will titrate Toprol -XL from 50 mg twice daily to 75 mg in AM/50 mg in PM. Pending reassessment of symptoms at her next visit, could titrate to 50 mg  twice daily. - She remains on Xarelto  15mg  daily for anticoagulation which is the correct dose given her CrCl at 48 mL/min.   3. Essential hypertension - BP was initially elevated at 162/82, rechecked and improved to 142/80. Continue Losartan  50 mg daily and will titrate Toprol -XL to 75 mg in AM/50 mg in PM as discussed above. If BP remains above goal, could further titrate Losartan  at her next visit.  4. Hyperlipidemia LDL goal <70 - LDL was at 829 when checked in 04/2023. She was restarted on Crestor  at the time of her last office visit and is overall tolerating well. Will recheck an FLP and LFT's within the next month for reassessment.  5. Preoperative Cardiac Clearance - She is planning to undergo cervical decompression/discectomy next month. Formal clearance has not been requested yet but at this time, she would  not require further cardiac testing given her recent reassuring workup over the past month. - Suspect she can hold Xarelto  for 2 days prior to her surgery but will route today's note to pharmacy to verify.  Disposition: Will increase Toprol -XL as discussed above. She has previously scheduled follow-up on 11/05/2023 and will keep that for now for reassessment.  Signed, Dorma Gash, PA-C

## 2023-10-13 ENCOUNTER — Encounter: Payer: Self-pay | Admitting: Student

## 2023-10-13 ENCOUNTER — Ambulatory Visit: Attending: Student | Admitting: Student

## 2023-10-13 VITALS — BP 142/80 | HR 75 | Ht 65.5 in | Wt 163.2 lb

## 2023-10-13 DIAGNOSIS — I25119 Atherosclerotic heart disease of native coronary artery with unspecified angina pectoris: Secondary | ICD-10-CM

## 2023-10-13 DIAGNOSIS — I2510 Atherosclerotic heart disease of native coronary artery without angina pectoris: Secondary | ICD-10-CM | POA: Diagnosis not present

## 2023-10-13 DIAGNOSIS — I4819 Other persistent atrial fibrillation: Secondary | ICD-10-CM

## 2023-10-13 DIAGNOSIS — Z01810 Encounter for preprocedural cardiovascular examination: Secondary | ICD-10-CM | POA: Diagnosis not present

## 2023-10-13 DIAGNOSIS — E785 Hyperlipidemia, unspecified: Secondary | ICD-10-CM

## 2023-10-13 DIAGNOSIS — I10 Essential (primary) hypertension: Secondary | ICD-10-CM | POA: Diagnosis not present

## 2023-10-13 MED ORDER — METOPROLOL SUCCINATE ER 25 MG PO TB24: 25.0000 mg | ORAL_TABLET | Freq: Every day | ORAL | 3 refills | Status: DC

## 2023-10-13 NOTE — Patient Instructions (Signed)
 Medication Instructions:   Increase Toprol  XL to 75 mg in the AM and 50 mg in the PM   *If you need a refill on your cardiac medications before your next appointment, please call your pharmacy*  Lab Work: Your physician recommends that you return for lab work in: 1 Month at [facility].    If you have labs (blood work) drawn today and your tests are completely normal, you will receive your results only by: [employer] (if you have MyChart) OR A paper copy in the mail If you have any lab test that is abnormal or we need to change your treatment, we will call you to review the results.  Testing/Procedures: NONE   Follow-Up: At [facility], you and your health needs are our priority.  As part of our continuing mission to provide you with exceptional heart care, our providers are all part of one team.  This team includes your primary Cardiologist (physician) and Advanced Practice Providers or APPs (Physician Assistants and Nurse Practitioners) who all work together to provide you with the care you need, when you need it.  Your next appointment:    July 10   Provider:   Lasalle Pointer, NP     We recommend signing up for the patient portal called MyChart.  Sign up information is provided on this After Visit Summary.  MyChart is used to connect with patients for Virtual Visits (Telemedicine).  Patients are able to view lab/test results, encounter notes, upcoming appointments, etc.  Non-urgent messages can be sent to your provider as well.   To learn more about what you can do with MyChart, go to [email or web address].   Other Instructions Thank you for choosing [facility]!

## 2023-10-19 ENCOUNTER — Encounter (HOSPITAL_COMMUNITY): Payer: Self-pay

## 2023-10-19 NOTE — Pre-Procedure Instructions (Signed)
 Surgical Instructions   Your procedure is scheduled on October 28, 2023. Report to [facility] Main Entrance A at 6:30 A.M., then check in with the Admitting office. Any questions or running late day of surgery: call [phone]  Questions prior to your surgery date: call [phone], Monday-Friday, 8am-4pm. If you experience any cold or flu symptoms such as cough, fever, chills, shortness of breath, etc. between now and your scheduled surgery, please notify us  at the above number.     Remember:  Do not eat after midnight the night before your surgery  You may drink clear liquids until 5:30 AM the morning of your surgery.   Clear liquids allowed are: Water , Non-Citrus Juices (without pulp), Carbonated Beverages, Clear Tea (no milk, honey, etc.), Black Coffee Only (NO MILK, CREAM OR POWDERED CREAMER of any kind), and Gatorade.  Patient Instructions  The night before surgery:  No food after midnight. ONLY clear liquids after midnight  The day of surgery (if you do NOT have diabetes):  Drink ONE (1) Pre-Surgery Clear Ensure by 5:30 AM the morning of surgery. Drink in one sitting. Do not sip.  This drink was given to you during your hospital  pre-op  appointment visit.  Nothing else to drink after completing the  Pre-Surgery Clear Ensure.         If you have questions, please contact your surgeon's office.    Take these medicines the morning of surgery with A SIP OF WATER : metoprolol  succinate (TOPROL  XL)    May take these medicines IF NEEDED: albuterol  (VENTOLIN  HFA) inhaler - please bring inhaler with you morning of surgery budesonide-formoterol (SYMBICORT) inhaler  diphenhydrAMINE  (BENADRYL )  gabapentin  (NEURONTIN )  HYDROcodone -acetaminophen  (NORCO)  nitroGLYCERIN  (NITROSTAT ) - if dose taken prior to surgery, please call either of the above phone numbers omeprazole  (PRILOSEC)    Follow your surgeon's instructions on when to stop Rivaroxaban  (XARELTO ).  If no instructions  were given by your surgeon then you will need to call the office to get those instructions.     One week prior to surgery, STOP taking any Aspirin  (unless otherwise instructed by your surgeon) Aleve, Naproxen, Ibuprofen , Motrin , Advil , Goody's, BC's, all herbal medications, fish oil, and non-prescription vitamins.                     Do NOT Smoke (Tobacco/Vaping) for 24 hours prior to your procedure.  If you use a CPAP at night, you may bring your mask/headgear for your overnight stay.   You will be asked to remove any contacts, glasses, piercing's, hearing aid's, dentures/partials prior to surgery. Please bring cases for these items if needed.    Patients discharged the day of surgery will not be allowed to drive home, and someone needs to stay with them for 24 hours.  SURGICAL WAITING ROOM VISITATION Patients may have no more than 2 support people in the waiting area - these visitors may rotate.   Pre-op  nurse will coordinate an appropriate time for 1 ADULT support person, who may not rotate, to accompany patient in pre-op .  Children under the age of 93 must have an adult with them who is not the patient and must remain in the main waiting area with an adult.  If the patient needs to stay at the hospital during part of their recovery, the visitor guidelines for inpatient rooms apply.  Please refer to the [facility] website for the visitor guidelines for any additional information.   If you received a COVID test during  your pre-op  visit  it is requested that you wear a mask when out in public, stay away from anyone that may not be feeling well and notify your surgeon if you develop symptoms. If you have been in contact with anyone that has tested positive in the last 10 days please notify you surgeon.      Pre-operative 5 CHG Bathing Instructions   You can play a key role in reducing the risk of infection after surgery. Your skin needs to be as free of germs as possible. You can  reduce the number of germs on your skin by washing with CHG (chlorhexidine  gluconate) soap before surgery. CHG is an antiseptic soap that kills germs and continues to kill germs even after washing.   DO NOT use if you have an allergy to chlorhexidine /CHG or antibacterial soaps. If your skin becomes reddened or irritated, stop using the CHG and notify one of our RNs at [phone].   Please shower with the CHG soap starting 4 days before surgery using the following schedule:     Please keep in mind the following:  DO NOT shave, including legs and underarms, starting the day of your first shower.   You may shave your face at any point before/day of surgery.  Place clean sheets on your bed the day you start using CHG soap. Use a clean washcloth (not used since being washed) for each shower. DO NOT sleep with pets once you start using the CHG.   CHG Shower Instructions:  Wash your face and private area with normal soap. If you choose to wash your hair, wash first with your normal shampoo.  After you use shampoo/soap, rinse your hair and body thoroughly to remove shampoo/soap residue.  Turn the water  OFF and apply about 3 tablespoons (45 ml) of CHG soap to a CLEAN washcloth.  Apply CHG soap ONLY FROM YOUR NECK DOWN TO YOUR TOES (washing for 3-5 minutes)  DO NOT use CHG soap on face, private areas, open wounds, or sores.  Pay special attention to the area where your surgery is being performed.  If you are having back surgery, having someone wash your back for you may be helpful. Wait 2 minutes after CHG soap is applied, then you may rinse off the CHG soap.  Pat dry with a clean towel  Put on clean clothes/pajamas   If you choose to wear lotion, please use ONLY the CHG-compatible lotions that are listed below.  Additional instructions for the day of surgery: DO NOT APPLY any lotions, deodorants, cologne, or perfumes.   Do not bring valuables to the hospital. [facility] is not responsible for  any belongings/valuables. Do not wear nail polish, gel polish, artificial nails, or any other type of covering on natural nails (fingers and toes) Do not wear jewelry or makeup Put on clean/comfortable clothes.  Please brush your teeth.  Ask your nurse before applying any prescription medications to the skin.     CHG Compatible Lotions   Aveeno Moisturizing lotion  Cetaphil Moisturizing Cream  Cetaphil Moisturizing Lotion  Clairol Herbal Essence Moisturizing Lotion, Dry Skin  Clairol Herbal Essence Moisturizing Lotion, Extra Dry Skin  Clairol Herbal Essence Moisturizing Lotion, Normal Skin  Curel Age Defying Therapeutic Moisturizing Lotion with Alpha Hydroxy  Curel Extreme Care Body Lotion  Curel Soothing Hands Moisturizing Hand Lotion  Curel Therapeutic Moisturizing Cream, Fragrance-Free  Curel Therapeutic Moisturizing Lotion, Fragrance-Free  Curel Therapeutic Moisturizing Lotion, Original Formula  Eucerin Daily Replenishing Lotion  Eucerin Dry  Skin Therapy Plus Alpha Hydroxy Crme  Eucerin Dry Skin Therapy Plus Alpha Hydroxy Lotion  Eucerin Original Crme  Eucerin Original Lotion  Eucerin Plus Crme Eucerin Plus Lotion  Eucerin TriLipid Replenishing Lotion  Keri Anti-Bacterial Hand Lotion  Keri Deep Conditioning Original Lotion Dry Skin Formula Softly Scented  Keri Deep Conditioning Original Lotion, Fragrance Free Sensitive Skin Formula  Keri Lotion Fast Absorbing Fragrance Free Sensitive Skin Formula  Keri Lotion Fast Absorbing Softly Scented Dry Skin Formula  Keri Original Lotion  Keri Skin Renewal Lotion Keri Silky Smooth Lotion  Keri Silky Smooth Sensitive Skin Lotion  Nivea Body Creamy Conditioning Oil  Nivea Body Extra Enriched Lotion  Nivea Body Original Lotion  Nivea Body Sheer Moisturizing Lotion Nivea Crme  Nivea Skin Firming Lotion  NutraDerm 30 Skin Lotion  NutraDerm Skin Lotion  NutraDerm Therapeutic Skin Cream  NutraDerm Therapeutic Skin Lotion   ProShield Protective Hand Cream  Provon moisturizing lotion  Please read over the following fact sheets that you were given.

## 2023-10-20 ENCOUNTER — Encounter (HOSPITAL_COMMUNITY)
Admission: RE | Admit: 2023-10-20 | Discharge: 2023-10-20 | Disposition: A | Discharge status: Home / Self Care | Source: Ambulatory Visit | Attending: Orthopedic Surgery | Admitting: Orthopedic Surgery

## 2023-10-20 ENCOUNTER — Other Ambulatory Visit: Payer: Self-pay

## 2023-10-20 ENCOUNTER — Encounter (HOSPITAL_COMMUNITY): Payer: Self-pay

## 2023-10-20 VITALS — BP 190/63 | HR 56 | Temp 98.0°F | Resp 17 | Ht 65.0 in | Wt 161.8 lb

## 2023-10-20 DIAGNOSIS — Z9011 Acquired absence of right breast and nipple: Secondary | ICD-10-CM | POA: Insufficient documentation

## 2023-10-20 DIAGNOSIS — Z01812 Encounter for preprocedural laboratory examination: Secondary | ICD-10-CM | POA: Diagnosis present

## 2023-10-20 DIAGNOSIS — Z853 Personal history of malignant neoplasm of breast: Secondary | ICD-10-CM | POA: Diagnosis not present

## 2023-10-20 DIAGNOSIS — K219 Gastro-esophageal reflux disease without esophagitis: Secondary | ICD-10-CM | POA: Diagnosis not present

## 2023-10-20 DIAGNOSIS — Z87891 Personal history of nicotine dependence: Secondary | ICD-10-CM | POA: Diagnosis not present

## 2023-10-20 DIAGNOSIS — I119 Hypertensive heart disease without heart failure: Secondary | ICD-10-CM | POA: Diagnosis not present

## 2023-10-20 DIAGNOSIS — J449 Chronic obstructive pulmonary disease, unspecified: Secondary | ICD-10-CM | POA: Diagnosis not present

## 2023-10-20 DIAGNOSIS — Z7901 Long term (current) use of anticoagulants: Secondary | ICD-10-CM | POA: Diagnosis not present

## 2023-10-20 DIAGNOSIS — Z01818 Encounter for other preprocedural examination: Secondary | ICD-10-CM

## 2023-10-20 DIAGNOSIS — I2510 Atherosclerotic heart disease of native coronary artery without angina pectoris: Secondary | ICD-10-CM | POA: Insufficient documentation

## 2023-10-20 DIAGNOSIS — I4819 Other persistent atrial fibrillation: Secondary | ICD-10-CM | POA: Insufficient documentation

## 2023-10-20 HISTORY — DX: Cardiac arrhythmia, unspecified: I49.9

## 2023-10-20 LAB — BASIC METABOLIC PANEL WITH GFR
Anion gap: 10 (ref 5–15)
BUN: 17 mg/dL (ref 8–23)
CO2: 27 mmol/L (ref 22–32)
Calcium: 9.9 mg/dL (ref 8.9–10.3)
Chloride: 103 mmol/L (ref 98–111)
Creatinine, Ser: 1.07 mg/dL — ABNORMAL HIGH (ref 0.44–1.00)
GFR, Estimated: 52 mL/min — ABNORMAL LOW (ref >60)
Glucose, Bld: 120 mg/dL — ABNORMAL HIGH (ref 70–99)
Potassium: 4.6 mmol/L (ref 3.5–5.1)
Sodium: 140 mmol/L (ref 135–145)

## 2023-10-20 LAB — CBC
HCT: 33.4 % — ABNORMAL LOW (ref 36.0–46.0)
Hemoglobin: 10.9 g/dL — ABNORMAL LOW (ref 12.0–15.0)
MCH: 31.1 pg (ref 26.0–34.0)
MCHC: 32.6 g/dL (ref 30.0–36.0)
MCV: 95.4 fL (ref 80.0–100.0)
Platelets: 276 10*3/uL (ref 150–400)
RBC: 3.5 MIL/uL — ABNORMAL LOW (ref 3.87–5.11)
RDW: 13.4 % (ref 11.5–15.5)
WBC: 8.5 10*3/uL (ref 4.0–10.5)
nRBC: 0 % (ref 0.0–0.2)

## 2023-10-20 LAB — SURGICAL PCR SCREEN
MRSA, PCR: NEGATIVE
Staphylococcus aureus: NEGATIVE

## 2023-10-20 NOTE — Pre-Procedure Instructions (Addendum)
 Surgical Instructions   Your procedure is scheduled on October 28, 2023. Report to [facility] Main Entrance A at 6:30 A.M., then check in with the Admitting office. Any questions or running late day of surgery: call [phone]  Questions prior to your surgery date: call [phone], Monday-Friday, 8am-4pm. If you experience any cold or flu symptoms such as cough, fever, chills, shortness of breath, etc. between now and your scheduled surgery, please notify us  at the above number.     Remember:  Do not eat after midnight the night before your surgery  You may drink clear liquids until 5:30 AM the morning of your surgery.   Clear liquids allowed are: Water , Non-Citrus Juices (without pulp), Carbonated Beverages, Clear Tea (no milk, honey, etc.), Black Coffee Only (NO MILK, CREAM OR POWDERED CREAMER of any kind), and Gatorade.  Patient Instructions  The night before surgery:  No food after midnight. ONLY clear liquids after midnight  The day of surgery (if you do NOT have diabetes):  Drink ONE (1) Pre-Surgery Clear Ensure by 5:30 AM the morning of surgery. Drink in one sitting. Do not sip.  This drink was given to you during your hospital  pre-op  appointment visit.  Nothing else to drink after completing the  Pre-Surgery Clear Ensure.         If you have questions, please contact your surgeon's office.    Take these medicines the morning of surgery with A SIP OF WATER : metoprolol  succinate (TOPROL  XL)    May take these medicines IF NEEDED: albuterol  (VENTOLIN  HFA) inhaler - please bring inhaler with you morning of surgery budesonide-formoterol (SYMBICORT) inhaler  diphenhydrAMINE  (BENADRYL )  gabapentin  (NEURONTIN )  HYDROcodone -acetaminophen  (NORCO)  nitroGLYCERIN  (NITROSTAT ) - if dose taken prior to surgery, please call either of the above phone numbers omeprazole  (PRILOSEC)   Per your cardiologist's instructions, you can hold your Rivaroxaban  (XARELTO ) for 3 days prior to  surgery. Your last dose will be taken on Saturday 10/24/23.  One week prior to surgery, STOP taking any Aspirin  (unless otherwise instructed by your surgeon) Aleve, Naproxen, Ibuprofen , Motrin , Advil , Goody's, BC's, all herbal medications, fish oil, and non-prescription vitamins.                     Do NOT Smoke (Tobacco/Vaping) for 24 hours prior to your procedure.  If you use a CPAP at night, you may bring your mask/headgear for your overnight stay.   You will be asked to remove any contacts, glasses, piercing's, hearing aid's, dentures/partials prior to surgery. Please bring cases for these items if needed.    Patients discharged the day of surgery will not be allowed to drive home, and someone needs to stay with them for 24 hours.  SURGICAL WAITING ROOM VISITATION Patients may have no more than 2 support people in the waiting area - these visitors may rotate.   Pre-op  nurse will coordinate an appropriate time for 1 ADULT support person, who may not rotate, to accompany patient in pre-op .  Children under the age of 11 must have an adult with them who is not the patient and must remain in the main waiting area with an adult.  If the patient needs to stay at the hospital during part of their recovery, the visitor guidelines for inpatient rooms apply.  Please refer to the [facility] website for the visitor guidelines for any additional information.   If you received a COVID test during your pre-op  visit  it is requested that you wear  a mask when out in public, stay away from anyone that may not be feeling well and notify your surgeon if you develop symptoms. If you have been in contact with anyone that has tested positive in the last 10 days please notify you surgeon.      Pre-operative 5 CHG Bathing Instructions   You can play a key role in reducing the risk of infection after surgery. Your skin needs to be as free of germs as possible. You can reduce the number of germs on your skin  by washing with CHG (chlorhexidine  gluconate) soap before surgery. CHG is an antiseptic soap that kills germs and continues to kill germs even after washing.   DO NOT use if you have an allergy to chlorhexidine /CHG or antibacterial soaps. If your skin becomes reddened or irritated, stop using the CHG and notify one of our RNs at [phone].   Please shower with the CHG soap starting 4 days before surgery using the following schedule:     Please keep in mind the following:  DO NOT shave, including legs and underarms, starting the day of your first shower.   You may shave your face at any point before/day of surgery.  Place clean sheets on your bed the day you start using CHG soap. Use a clean washcloth (not used since being washed) for each shower. DO NOT sleep with pets once you start using the CHG.   CHG Shower Instructions:  Wash your face and private area with normal soap. If you choose to wash your hair, wash first with your normal shampoo.  After you use shampoo/soap, rinse your hair and body thoroughly to remove shampoo/soap residue.  Turn the water  OFF and apply about 3 tablespoons (45 ml) of CHG soap to a CLEAN washcloth.  Apply CHG soap ONLY FROM YOUR NECK DOWN TO YOUR TOES (washing for 3-5 minutes)  DO NOT use CHG soap on face, private areas, open wounds, or sores.  Pay special attention to the area where your surgery is being performed.  If you are having back surgery, having someone wash your back for you may be helpful. Wait 2 minutes after CHG soap is applied, then you may rinse off the CHG soap.  Pat dry with a clean towel  Put on clean clothes/pajamas   If you choose to wear lotion, please use ONLY the CHG-compatible lotions that are listed below.  Additional instructions for the day of surgery: DO NOT APPLY any lotions, deodorants, cologne, or perfumes.   Do not bring valuables to the hospital. [facility] is not responsible for any belongings/valuables. Do not wear  nail polish, gel polish, artificial nails, or any other type of covering on natural nails (fingers and toes) Do not wear jewelry or makeup Put on clean/comfortable clothes.  Please brush your teeth.  Ask your nurse before applying any prescription medications to the skin.     CHG Compatible Lotions   Aveeno Moisturizing lotion  Cetaphil Moisturizing Cream  Cetaphil Moisturizing Lotion  Clairol Herbal Essence Moisturizing Lotion, Dry Skin  Clairol Herbal Essence Moisturizing Lotion, Extra Dry Skin  Clairol Herbal Essence Moisturizing Lotion, Normal Skin  Curel Age Defying Therapeutic Moisturizing Lotion with Alpha Hydroxy  Curel Extreme Care Body Lotion  Curel Soothing Hands Moisturizing Hand Lotion  Curel Therapeutic Moisturizing Cream, Fragrance-Free  Curel Therapeutic Moisturizing Lotion, Fragrance-Free  Curel Therapeutic Moisturizing Lotion, Original Formula  Eucerin Daily Replenishing Lotion  Eucerin Dry Skin Therapy Plus Alpha Hydroxy Crme  Eucerin Dry Skin  Therapy Plus Alpha Hydroxy Lotion  Eucerin Original Crme  Eucerin Original Lotion  Eucerin Plus Crme Eucerin Plus Lotion  Eucerin TriLipid Replenishing Lotion  Keri Anti-Bacterial Hand Lotion  Keri Deep Conditioning Original Lotion Dry Skin Formula Softly Scented  Keri Deep Conditioning Original Lotion, Fragrance Free Sensitive Skin Formula  Keri Lotion Fast Absorbing Fragrance Free Sensitive Skin Formula  Keri Lotion Fast Absorbing Softly Scented Dry Skin Formula  Keri Original Lotion  Keri Skin Renewal Lotion Keri Silky Smooth Lotion  Keri Silky Smooth Sensitive Skin Lotion  Nivea Body Creamy Conditioning Oil  Nivea Body Extra Enriched Lotion  Nivea Body Original Lotion  Nivea Body Sheer Moisturizing Lotion Nivea Crme  Nivea Skin Firming Lotion  NutraDerm 30 Skin Lotion  NutraDerm Skin Lotion  NutraDerm Therapeutic Skin Cream  NutraDerm Therapeutic Skin Lotion  ProShield Protective Hand Cream  Provon  moisturizing lotion  Please read over the following fact sheets that you were given.

## 2023-10-20 NOTE — Progress Notes (Signed)
 PCP - Charmaine Suanne PIETY Cardiologist - Jayson Debera COME  PPM/ICD - denies Device Orders -  Rep Notified -   Chest x-ray - na EKG - 09/15/23 Stress Test - 08/14/23 ECHO - 06/02/23 Cardiac Cath - 09/15/23  Sleep Study - denies CPAP -   Fasting Blood Sugar - na Checks Blood Sugar _____ times a day  Last dose of GLP1 agonist-  na GLP1 instructions:   Blood Thinner Instructions: hold Xarelto  3 days prior to surgery per Dr. Debera. Last dose will be 10/24/23. Aspirin  Instructions:na  ERAS Protcol - clears until 0530 PRE-SURGERY Ensure or G2- Ensure  COVID TEST- na   Anesthesia review: yes- cardiac cath 08/2023  Patient denies shortness of breath, fever, cough and chest pain at PAT appointment   All instructions explained to the patient, with a verbal understanding of the material. Patient agrees to go over the instructions while at home for a better understanding. The opportunity to ask questions was provided.

## 2023-10-21 NOTE — Progress Notes (Signed)
 Anesthesia Chart Review:  83 year old female follows with cardiology for history of CAD (s/p cath in 12/2019 showing nonobstructive CAD and medical management recommended), HTN, HLD, persistent atrial fibrillation on Xarelto .  Recent echo showed preserved EF 65% with normal wall motion, grade 1 DD.  NST 07/2023 was also low risk.  However, due to persistent complaint of exertional dyspnea, cardiac catheterization was recommended for definitive evaluation.  Cath 09/15/2023 showed nonobstructive disease which was relatively stable compared to prior cath in 2001.  Continue medical management was recommended.  She was subsequently seen by Laymon Qua, PA-C on 10/13/2023 for preop evaluation.  Per note,  Preoperative Cardiac Clearance - She is planning to undergo cervical decompression/discectomy next month. Formal clearance has not been requested yet but at this time, she would not require further cardiac testing given her recent reassuring workup over the past month. - Suspect she can hold Xarelto  for 2 days prior to her surgery but will route today's note to pharmacy to verify. Addendum: 10/15/2023: Verified with Pharmacy that she should hold Xarelto  3 days prior to surgery. Called and spoke to the patient in regards to this and she voiced understanding.    Patient reports last dose of Xarelto  10/24/2023.  Other pertinent history includes former smoker with associated COPD, GERD on PPI, history of breast cancer s/p right mastectomy.  Preop labs reviewed, mild anemia hemoglobin 10.9, otherwise unremarkable.  EKG 09/15/2023: Sinus rhythm with marked sinus arrhythmia.  Rate 64. Anteroseptal infarct , age undetermined  Cath 09/15/2023:   Mid RCA lesion is 60% stenosed. -Mild progression of disease; Prox RCA lesion is 20% stenosed.   Mid LAD lesion is 20% stenosed.   Prox Cx lesion is 50% stenosed. LPAV lesion is 60% stenosed (stable upon improved flow)   LV end diastolic pressure is normal.   There is no  aortic valve stenosis.  TTE 06/02/2023: 1. Left ventricular ejection fraction, by estimation, is 60 to 65%. The  left ventricle has normal function. The left ventricle has no regional  wall motion abnormalities. There is mild left ventricular hypertrophy.  Left ventricular diastolic parameters  are consistent with Grade I diastolic dysfunction (impaired relaxation).  Elevated left ventricular end-diastolic pressure. The average left  ventricular global longitudinal strain is -21.1 %. The global longitudinal  strain is normal.   2. Right ventricular systolic function is normal. The right ventricular  size is normal.   3. The mitral valve is normal in structure. Trivial mitral valve  regurgitation. No evidence of mitral stenosis.   4. The aortic valve is tricuspid. Aortic valve regurgitation is not  visualized. No aortic stenosis is present.   5. The inferior vena cava is normal in size with greater than 50%  respiratory variability, suggesting right atrial pressure of 3 mmHg.   6. Cannot exclude a small PFO.   Comparison(s): No significant change from prior study.     Lynwood Geofm RIGGERS [facility] Short Stay Center/Anesthesiology Phone [phone] 10/21/2023 2:31 PM

## 2023-10-21 NOTE — Anesthesia Preprocedure Evaluation (Addendum)
 Anesthesia Evaluation  Patient identified by MRN, date of birth, ID band Patient awake    Reviewed: Allergy & Precautions, NPO status , Patient's Chart, lab work & pertinent test results  Airway Mallampati: I  TM Distance: >3 FB Neck ROM: Limited    Dental  (+) Edentulous Upper, Edentulous Lower   Pulmonary asthma , former smoker   breath sounds clear to auscultation       Cardiovascular hypertension, Pt. on home beta blockers and Pt. on medications + CAD and + Past MI  + dysrhythmias Atrial Fibrillation  Rhythm:Regular Rate:Normal  Echo TTE 06/02/2023: 1. Left ventricular ejection fraction, by estimation, is 60 to 65%. The  left ventricle has normal function. The left ventricle has no regional  wall motion abnormalities. There is mild left ventricular hypertrophy.  Left ventricular diastolic parameters  are consistent with Grade I diastolic dysfunction (impaired relaxation).  Elevated left ventricular end-diastolic pressure. The average left  ventricular global longitudinal strain is -21.1 %. The global longitudinal  strain is normal.  2. Right ventricular systolic function is normal. The right ventricular  size is normal.  3. The mitral valve is normal in structure. Trivial mitral valve  regurgitation. No evidence of mitral stenosis.  4. The aortic valve is tricuspid. Aortic valve regurgitation is not  visualized. No aortic stenosis is present.  5. The inferior vena cava is normal in size with greater than 50%  respiratory variability, suggesting right atrial pressure of 3 mmHg.  6. Cannot exclude a small PFO.     Neuro/Psych   Anxiety     negative neurological ROS     GI/Hepatic Neg liver ROS,GERD  Medicated,,  Endo/Other  Hypothyroidism    Renal/GU negative Renal ROS     Musculoskeletal  (+) Arthritis ,    Abdominal   Peds  Hematology  (+) Blood dyscrasia, anemia   Anesthesia Other Findings    Reproductive/Obstetrics                              Anesthesia Physical Anesthesia Plan  ASA: 3  Anesthesia Plan: General   Post-op Pain Management: Tylenol  PO (pre-op )*   Induction: Intravenous  PONV Risk Score and Plan: 4 or greater and Ondansetron , Dexamethasone  and Treatment may vary due to age or medical condition  Airway Management Planned: Oral ETT and Video Laryngoscope Planned  Additional Equipment: None  Intra-op Plan:   Post-operative Plan: Extubation in OR  Informed Consent: I have reviewed the patients History and Physical, chart, labs and discussed the procedure including the risks, benefits and alternatives for the proposed anesthesia with the patient or authorized representative who has indicated his/her understanding and acceptance.       Plan Discussed with: CRNA  Anesthesia Plan Comments: (PAT note by Lynwood Hope, PA-C:  83 year old female follows with cardiology for history of CAD (s/p cath in 12/2019 showing nonobstructive CAD and medical management recommended), HTN, HLD, persistent atrial fibrillation on Xarelto .  Recent echo showed preserved EF 65% with normal wall motion, grade 1 DD.  NST 07/2023 was also low risk.  However, due to persistent complaint of exertional dyspnea, cardiac catheterization was recommended for definitive evaluation.  Cath 09/15/2023 showed nonobstructive disease which was relatively stable compared to prior cath in 2001.  Continue medical management was recommended.  She was subsequently seen by Laymon Qua, PA-C on 10/13/2023 for preop evaluation.  Per note, Preoperative Cardiac Clearance - She is planning to undergo cervical  decompression/discectomy next month. Formal clearance has not been requested yet but at this time, she would not require further cardiac testing given her recent reassuring workup over the past month. - Suspect she can hold Xarelto  for 2 days prior to her surgery but will route  today's note to pharmacy to verify. Addendum: 10/15/2023: Verified with Pharmacy that she should hold Xarelto  3 days prior to surgery. Called and spoke to the patient in regards to this and she voiced understanding.    Patient reports last dose of Xarelto  10/24/2023.  Other pertinent history includes former smoker with associated COPD, GERD on PPI, history of breast cancer s/p right mastectomy.  Preop labs reviewed, mild anemia hemoglobin 10.9, otherwise unremarkable.  EKG 09/15/2023: Sinus rhythm with marked sinus arrhythmia.  Rate 64. Anteroseptal infarct , age undetermined  Cath 09/15/2023:   Mid RCA lesion is 60% stenosed. -Mild progression of disease; Prox RCA lesion is 20% stenosed.   Mid LAD lesion is 20% stenosed.   Prox Cx lesion is 50% stenosed. LPAV lesion is 60% stenosed (stable upon improved flow)   LV end diastolic pressure is normal.   There is no aortic valve stenosis.  TTE 06/02/2023: 1. Left ventricular ejection fraction, by estimation, is 60 to 65%. The  left ventricle has normal function. The left ventricle has no regional  wall motion abnormalities. There is mild left ventricular hypertrophy.  Left ventricular diastolic parameters  are consistent with Grade I diastolic dysfunction (impaired relaxation).  Elevated left ventricular end-diastolic pressure. The average left  ventricular global longitudinal strain is -21.1 %. The global longitudinal  strain is normal.  2. Right ventricular systolic function is normal. The right ventricular  size is normal.  3. The mitral valve is normal in structure. Trivial mitral valve  regurgitation. No evidence of mitral stenosis.  4. The aortic valve is tricuspid. Aortic valve regurgitation is not  visualized. No aortic stenosis is present.  5. The inferior vena cava is normal in size with greater than 50%  respiratory variability, suggesting right atrial pressure of 3 mmHg.  6. Cannot exclude a small PFO.    Comparison(s): No significant change from prior study.   )         Anesthesia Quick Evaluation

## 2023-10-28 ENCOUNTER — Other Ambulatory Visit: Payer: Self-pay

## 2023-10-28 ENCOUNTER — Ambulatory Visit (HOSPITAL_COMMUNITY): Admitting: Certified Registered Nurse Anesthetist

## 2023-10-28 ENCOUNTER — Ambulatory Visit (HOSPITAL_COMMUNITY)

## 2023-10-28 ENCOUNTER — Telehealth: Payer: Self-pay | Admitting: Cardiology

## 2023-10-28 ENCOUNTER — Ambulatory Visit (HOSPITAL_COMMUNITY)
Admission: RE | Admit: 2023-10-28 | Discharge: 2023-10-28 | Disposition: A | Discharge status: Home / Self Care | Attending: Orthopedic Surgery | Admitting: Orthopedic Surgery

## 2023-10-28 ENCOUNTER — Encounter (HOSPITAL_COMMUNITY): Payer: Self-pay | Admitting: Orthopedic Surgery

## 2023-10-28 ENCOUNTER — Encounter (HOSPITAL_COMMUNITY)
Admission: RE | Disposition: A | Discharge status: Home / Self Care | Payer: Self-pay | Source: Home / Self Care | Attending: Orthopedic Surgery

## 2023-10-28 ENCOUNTER — Ambulatory Visit (HOSPITAL_COMMUNITY): Payer: Self-pay | Admitting: Physician Assistant

## 2023-10-28 DIAGNOSIS — Z87891 Personal history of nicotine dependence: Secondary | ICD-10-CM | POA: Diagnosis not present

## 2023-10-28 DIAGNOSIS — I4891 Unspecified atrial fibrillation: Secondary | ICD-10-CM | POA: Diagnosis not present

## 2023-10-28 DIAGNOSIS — M549 Dorsalgia, unspecified: Secondary | ICD-10-CM | POA: Insufficient documentation

## 2023-10-28 DIAGNOSIS — K219 Gastro-esophageal reflux disease without esophagitis: Secondary | ICD-10-CM | POA: Diagnosis not present

## 2023-10-28 DIAGNOSIS — I10 Essential (primary) hypertension: Secondary | ICD-10-CM | POA: Insufficient documentation

## 2023-10-28 DIAGNOSIS — I2510 Atherosclerotic heart disease of native coronary artery without angina pectoris: Secondary | ICD-10-CM

## 2023-10-28 DIAGNOSIS — M4802 Spinal stenosis, cervical region: Secondary | ICD-10-CM | POA: Diagnosis present

## 2023-10-28 DIAGNOSIS — J45909 Unspecified asthma, uncomplicated: Secondary | ICD-10-CM | POA: Diagnosis not present

## 2023-10-28 DIAGNOSIS — Z853 Personal history of malignant neoplasm of breast: Secondary | ICD-10-CM | POA: Insufficient documentation

## 2023-10-28 DIAGNOSIS — G992 Myelopathy in diseases classified elsewhere: Secondary | ICD-10-CM | POA: Insufficient documentation

## 2023-10-28 DIAGNOSIS — G8929 Other chronic pain: Secondary | ICD-10-CM | POA: Diagnosis not present

## 2023-10-28 HISTORY — PX: ANTERIOR CERVICAL DECOMP/DISCECTOMY FUSION: SHX1161

## 2023-10-28 SURGERY — ANTERIOR CERVICAL DECOMPRESSION/DISCECTOMY FUSION 2 LEVELS
Anesthesia: General

## 2023-10-28 MED ORDER — HYDROCODONE-ACETAMINOPHEN 7.5-325 MG PO TABS: 1.0000 | ORAL_TABLET | Freq: Four times a day (QID) | ORAL | 0 refills | Status: AC | PRN

## 2023-10-28 MED ORDER — METHOCARBAMOL 500 MG PO TABS
ORAL_TABLET | ORAL | Status: AC
Start: 2023-10-28 — End: 2023-10-28
  Filled 2023-10-28: qty 1

## 2023-10-28 MED ORDER — METHOCARBAMOL 500 MG PO TABS: 500.0000 mg | ORAL_TABLET | Freq: Four times a day (QID) | ORAL | 0 refills | Status: DC

## 2023-10-28 MED ORDER — ONDANSETRON HCL 4 MG/2ML IJ SOLN
INTRAMUSCULAR | Status: DC | PRN
  Administered 2023-10-28: 4 mg via INTRAVENOUS

## 2023-10-28 MED ORDER — BUPIVACAINE-EPINEPHRINE 0.25% -1:200000 IJ SOLN
INTRAMUSCULAR | Status: DC | PRN
Start: 2023-10-28 — End: 2023-10-28
  Administered 2023-10-28: 5 mL

## 2023-10-28 MED ORDER — VASOPRESSIN 20 UNIT/ML IV SOLN
INTRAVENOUS | Status: DC | PRN
  Administered 2023-10-28: 1.5 [IU] via INTRAVENOUS

## 2023-10-28 MED ORDER — FENTANYL CITRATE (PF) 100 MCG/2ML IJ SOLN
25.0000 ug | INTRAMUSCULAR | Status: DC | PRN
  Administered 2023-10-28 (×2): 50 ug via INTRAVENOUS

## 2023-10-28 MED ORDER — OXYCODONE HCL 5 MG/5ML PO SOLN: 5.0000 mg | Freq: Once | ORAL | Status: AC | PRN

## 2023-10-28 MED ORDER — 0.9 % SODIUM CHLORIDE (POUR BTL) OPTIME
TOPICAL | Status: DC | PRN
  Administered 2023-10-28: 1000 mL

## 2023-10-28 MED ORDER — LIDOCAINE 2% (20 MG/ML) 5 ML SYRINGE
INTRAMUSCULAR | Status: AC
  Filled 2023-10-28: qty 5

## 2023-10-28 MED ORDER — THROMBIN 20000 UNITS EX SOLR
CUTANEOUS | Status: AC
  Filled 2023-10-28: qty 20000

## 2023-10-28 MED ORDER — ACETAMINOPHEN 10 MG/ML IV SOLN
1000.0000 mg | Freq: Once | INTRAVENOUS | Status: DC | PRN
  Administered 2023-10-28: 1000 mg via INTRAVENOUS

## 2023-10-28 MED ORDER — LIDOCAINE 2% (20 MG/ML) 5 ML SYRINGE
INTRAMUSCULAR | Status: DC | PRN
  Administered 2023-10-28: 40 mg via INTRAVENOUS

## 2023-10-28 MED ORDER — METHOCARBAMOL 500 MG PO TABS
500.0000 mg | ORAL_TABLET | Freq: Once | ORAL | Status: AC
  Administered 2023-10-28: 500 mg via ORAL

## 2023-10-28 MED ORDER — EPHEDRINE SULFATE-NACL 50-0.9 MG/10ML-% IV SOSY
PREFILLED_SYRINGE | INTRAVENOUS | Status: DC | PRN
  Administered 2023-10-28 (×7): 5 mg via INTRAVENOUS

## 2023-10-28 MED ORDER — POVIDONE-IODINE 7.5 % EX SOLN: Freq: Once | CUTANEOUS | Status: DC

## 2023-10-28 MED ORDER — ROCURONIUM BROMIDE 10 MG/ML (PF) SYRINGE
PREFILLED_SYRINGE | INTRAVENOUS | Status: DC | PRN
  Administered 2023-10-28: 50 mg via INTRAVENOUS
  Administered 2023-10-28: 10 mg via INTRAVENOUS

## 2023-10-28 MED ORDER — DEXAMETHASONE SODIUM PHOSPHATE 10 MG/ML IJ SOLN
INTRAMUSCULAR | Status: AC
  Filled 2023-10-28: qty 1

## 2023-10-28 MED ORDER — THROMBIN 20000 UNITS EX SOLR
CUTANEOUS | Status: DC | PRN
  Administered 2023-10-28: 20 mL

## 2023-10-28 MED ORDER — EPHEDRINE 5 MG/ML INJ
INTRAVENOUS | Status: AC
  Filled 2023-10-28: qty 5

## 2023-10-28 MED ORDER — DEXAMETHASONE SODIUM PHOSPHATE 10 MG/ML IJ SOLN
INTRAMUSCULAR | Status: DC | PRN
  Administered 2023-10-28: 5 mg via INTRAVENOUS

## 2023-10-28 MED ORDER — ACETAMINOPHEN 10 MG/ML IV SOLN
INTRAVENOUS | Status: AC
  Filled 2023-10-28: qty 100

## 2023-10-28 MED ORDER — FENTANYL CITRATE (PF) 250 MCG/5ML IJ SOLN
INTRAMUSCULAR | Status: DC | PRN
  Administered 2023-10-28: 75 ug via INTRAVENOUS
  Administered 2023-10-28: 25 ug via INTRAVENOUS

## 2023-10-28 MED ORDER — PROPOFOL 10 MG/ML IV BOLUS
INTRAVENOUS | Status: DC | PRN
  Administered 2023-10-28: 30 mg via INTRAVENOUS
  Administered 2023-10-28: 90 mg via INTRAVENOUS
  Administered 2023-10-28: 30 mg via INTRAVENOUS

## 2023-10-28 MED ORDER — PROPOFOL 10 MG/ML IV BOLUS
INTRAVENOUS | Status: AC
  Filled 2023-10-28: qty 20

## 2023-10-28 MED ORDER — OXYCODONE HCL 5 MG PO TABS
ORAL_TABLET | ORAL | Status: AC
Start: 2023-10-28 — End: 2023-10-28
  Filled 2023-10-28: qty 1

## 2023-10-28 MED ORDER — PHENYLEPHRINE 80 MCG/ML (10ML) SYRINGE FOR IV PUSH (FOR BLOOD PRESSURE SUPPORT)
PREFILLED_SYRINGE | INTRAVENOUS | Status: DC | PRN
  Administered 2023-10-28: 160 ug via INTRAVENOUS
  Administered 2023-10-28: 80 ug via INTRAVENOUS
  Administered 2023-10-28 (×2): 160 ug via INTRAVENOUS
  Administered 2023-10-28 (×3): 80 ug via INTRAVENOUS

## 2023-10-28 MED ORDER — ONDANSETRON HCL 4 MG/2ML IJ SOLN
INTRAMUSCULAR | Status: AC
  Filled 2023-10-28: qty 2

## 2023-10-28 MED ORDER — SUGAMMADEX SODIUM 200 MG/2ML IV SOLN
INTRAVENOUS | Status: DC | PRN
  Administered 2023-10-28: 200 mg via INTRAVENOUS

## 2023-10-28 MED ORDER — ALBUMIN HUMAN 5 % IV SOLN: INTRAVENOUS | Status: DC | PRN

## 2023-10-28 MED ORDER — ROCURONIUM BROMIDE 10 MG/ML (PF) SYRINGE
PREFILLED_SYRINGE | INTRAVENOUS | Status: AC
  Filled 2023-10-28: qty 10

## 2023-10-28 MED ORDER — BUPIVACAINE-EPINEPHRINE (PF) 0.25% -1:200000 IJ SOLN
INTRAMUSCULAR | Status: AC
  Filled 2023-10-28: qty 30

## 2023-10-28 MED ORDER — ORAL CARE MOUTH RINSE: 15.0000 mL | Freq: Once | OROMUCOSAL | Status: AC

## 2023-10-28 MED ORDER — CHLORHEXIDINE GLUCONATE 0.12 % MT SOLN
15.0000 mL | Freq: Once | OROMUCOSAL | Status: AC
  Administered 2023-10-28: 15 mL via OROMUCOSAL
  Filled 2023-10-28: qty 15

## 2023-10-28 MED ORDER — FENTANYL CITRATE (PF) 100 MCG/2ML IJ SOLN
INTRAMUSCULAR | Status: AC
  Filled 2023-10-28: qty 2

## 2023-10-28 MED ORDER — CEFAZOLIN SODIUM-DEXTROSE 2-4 GM/100ML-% IV SOLN
2.0000 g | INTRAVENOUS | Status: AC
  Administered 2023-10-28: 2 g via INTRAVENOUS
  Filled 2023-10-28: qty 100

## 2023-10-28 MED ORDER — MIDAZOLAM HCL 2 MG/2ML IJ SOLN
INTRAMUSCULAR | Status: AC
  Filled 2023-10-28: qty 2

## 2023-10-28 MED ORDER — FENTANYL CITRATE (PF) 250 MCG/5ML IJ SOLN
INTRAMUSCULAR | Status: AC
  Filled 2023-10-28: qty 5

## 2023-10-28 MED ORDER — PHENYLEPHRINE HCL-NACL 20-0.9 MG/250ML-% IV SOLN
INTRAVENOUS | Status: DC | PRN
  Administered 2023-10-28: 40 ug/min via INTRAVENOUS

## 2023-10-28 MED ORDER — DROPERIDOL 2.5 MG/ML IJ SOLN: 0.6250 mg | Freq: Once | INTRAMUSCULAR | Status: DC | PRN

## 2023-10-28 MED ORDER — OXYCODONE HCL 5 MG PO TABS
5.0000 mg | ORAL_TABLET | Freq: Once | ORAL | Status: AC | PRN
  Administered 2023-10-28: 5 mg via ORAL

## 2023-10-28 MED ORDER — PHENYLEPHRINE 80 MCG/ML (10ML) SYRINGE FOR IV PUSH (FOR BLOOD PRESSURE SUPPORT)
PREFILLED_SYRINGE | INTRAVENOUS | Status: AC
  Filled 2023-10-28: qty 10

## 2023-10-28 MED ORDER — VASOPRESSIN 20 UNIT/ML IV SOLN
INTRAVENOUS | Status: AC
  Filled 2023-10-28: qty 1

## 2023-10-28 MED ORDER — LACTATED RINGERS IV SOLN: INTRAVENOUS | Status: DC

## 2023-10-28 SURGICAL SUPPLY — 63 items
BAG COUNTER SPONGE SURGICOUNT (BAG) ×1 IMPLANT
BENZOIN TINCTURE PRP APPL 2/3 (GAUZE/BANDAGES/DRESSINGS) ×1 IMPLANT
BIT DRILL NEURO 2X3.1 SFT TUCH (MISCELLANEOUS) ×1 IMPLANT
BIT DRILL SRG 14X2.2XFLT CHK (BIT) IMPLANT
BLADE CLIPPER SURG (BLADE) ×1 IMPLANT
BLADE SURG 15 STRL LF DISP TIS (BLADE) ×1 IMPLANT
BONE VIVIGEN FORMABLE 1.3CC (Bone Implant) ×2 IMPLANT
CAGE 16X6X14 ENDOSKEL MED (Orthopedic Implant) IMPLANT
CAGE LORDOTIC 6 SM (Cage) IMPLANT
CANISTER SUCTION 3000ML PPV (SUCTIONS) ×1 IMPLANT
COLLAR CERV LO CONTOUR FIRM DE (SOFTGOODS) IMPLANT
CORD BIPOLAR FORCEPS 12FT (ELECTRODE) ×1 IMPLANT
COVER SURGICAL LIGHT HANDLE (MISCELLANEOUS) ×1 IMPLANT
DRAPE C-ARM 42X72 X-RAY (DRAPES) ×1 IMPLANT
DRAPE POUCH INSTRU U-SHP 10X18 (DRAPES) ×1 IMPLANT
DRAPE SURG 17X23 STRL (DRAPES) ×4 IMPLANT
DURAPREP 26ML APPLICATOR (WOUND CARE) ×1 IMPLANT
ELECT COATED BLADE 2.86 ST (ELECTRODE) ×1 IMPLANT
ELECTRODE REM PT RTRN 9FT ADLT (ELECTROSURGICAL) ×1 IMPLANT
GAUZE 4X4 16PLY ~~LOC~~+RFID DBL (SPONGE) ×1 IMPLANT
GAUZE SPONGE 4X4 12PLY STRL (GAUZE/BANDAGES/DRESSINGS) ×1 IMPLANT
GLOVE BIO SURGEON STRL SZ 6.5 (GLOVE) ×1 IMPLANT
GLOVE BIO SURGEON STRL SZ8 (GLOVE) ×1 IMPLANT
GLOVE BIOGEL PI IND STRL 7.0 (GLOVE) ×2 IMPLANT
GLOVE BIOGEL PI IND STRL 8 (GLOVE) ×1 IMPLANT
GLOVE SURG ENC MOIS LTX SZ6.5 (GLOVE) ×1 IMPLANT
GOWN STRL REUS W/ TWL LRG LVL3 (GOWN DISPOSABLE) ×1 IMPLANT
GOWN STRL REUS W/ TWL XL LVL3 (GOWN DISPOSABLE) ×1 IMPLANT
GRAFT BNE MATRIX VG FRMBL SM 1 (Bone Implant) IMPLANT
IV CATH 14GX2 1/4 (CATHETERS) ×1 IMPLANT
KIT BASIN OR (CUSTOM PROCEDURE TRAY) ×1 IMPLANT
KIT TURNOVER KIT B (KITS) ×1 IMPLANT
MANIFOLD NEPTUNE II (INSTRUMENTS) ×1 IMPLANT
NDL PRECISIONGLIDE 27X1.5 (NEEDLE) ×1 IMPLANT
NDL SPNL 18GX3.5 QUINCKE PK (NEEDLE) ×1 IMPLANT
NEEDLE PRECISIONGLIDE 27X1.5 (NEEDLE) ×1 IMPLANT
NEEDLE SPNL 18GX3.5 QUINCKE PK (NEEDLE) ×1 IMPLANT
NS IRRIG 1000ML POUR BTL (IV SOLUTION) ×1 IMPLANT
PACK ORTHO CERVICAL (CUSTOM PROCEDURE TRAY) ×1 IMPLANT
PAD ARMBOARD POSITIONER FOAM (MISCELLANEOUS) ×2 IMPLANT
PATTIES SURGICAL .5 X.5 (GAUZE/BANDAGES/DRESSINGS) ×1 IMPLANT
PATTIES SURGICAL .5 X1 (DISPOSABLE) IMPLANT
PIN DISTRACTION 14 (PIN) IMPLANT
PLATE TWO LEVEL SKYLINE 30MM (Plate) IMPLANT
POSITIONER HEAD DONUT 9IN (MISCELLANEOUS) ×1 IMPLANT
SCREW SKYLINE VAR OS 14MM (Screw) IMPLANT
SCREW VAR SELF TAP SKYLINE 14M (Screw) IMPLANT
SPIKE FLUID TRANSFER (MISCELLANEOUS) ×1 IMPLANT
SPONGE INTESTINAL PEANUT (DISPOSABLE) ×1 IMPLANT
SPONGE SURGIFOAM ABS GEL 100 (HEMOSTASIS) ×1 IMPLANT
STRIP CLOSURE SKIN 1/2X4 (GAUZE/BANDAGES/DRESSINGS) ×1 IMPLANT
SURGIFLO W/THROMBIN 8M KIT (HEMOSTASIS) IMPLANT
SUT MNCRL AB 4-0 PS2 18 (SUTURE) ×1 IMPLANT
SUT SILK 4-0 18XBRD TIE 12 (SUTURE) IMPLANT
SUT VIC AB 2-0 CT2 18 VCP726D (SUTURE) ×1 IMPLANT
SYR BULB IRRIG 60ML STRL (SYRINGE) ×1 IMPLANT
SYR CONTROL 10ML LL (SYRINGE) ×3 IMPLANT
TAPE CLOTH 4X10 WHT NS (GAUZE/BANDAGES/DRESSINGS) ×1 IMPLANT
TAPE UMBILICAL 1/8X30 (MISCELLANEOUS) ×2 IMPLANT
TOWEL GREEN STERILE (TOWEL DISPOSABLE) ×1 IMPLANT
TOWEL GREEN STERILE FF (TOWEL DISPOSABLE) ×1 IMPLANT
WATER STERILE IRR 1000ML POUR (IV SOLUTION) ×1 IMPLANT
YANKAUER SUCT BULB TIP NO VENT (SUCTIONS) ×1 IMPLANT

## 2023-10-28 NOTE — H&P (Signed)
 PREOPERATIVE H&P  Chief Complaint: Bilateral hand numbness and tingling, weakness  HPI: [name] is a 83 y.o. female who presents with progressive weakness and hand numbness  MRI reveals cord compression at C6/7 and C7/T1  Patient has failed multiple forms of conservative care and continues to have pain (see office notes for additional details regarding the patient's full course of treatment)  Past Medical History:  Diagnosis Date   Anemia    Arthritis    Breast cancer (HCC)    Right mastectomy   Chronic back pain    Coronary atherosclerosis of native coronary artery    a. 12/2019 Cath: LM nl, LAD 38m, LCX 50p, 80d, RCA 20p, 48m-->med rx.   Demand ischemia (HCC)    a. 02/2021 HsTrop to 361 in setting of rapid afib; b. 02/2021 Echo: EF 65-70%, no rwma, mild LVH, RVSP 28.68mmHg. Mildly to mod dil LA. Triv MR. Ao sclerosis w/o stenosis.   Dysrhythmia    A. Fib   Environmental allergies    Essential hypertension    GERD (gastroesophageal reflux disease)    History of bronchitis    History of colon polyps    History of diverticulosis    History of migraine    Panic attacks    Persistent atrial fibrillation (HCC) 02/2021   a. CHA2DS2VASc = 5-->xarelto .   Prolapse of vaginal vault after hysterectomy    Seasonal allergies    Urinary frequency    Past Surgical History:  Procedure Laterality Date   ABDOMINAL HYSTERECTOMY     ATRIAL FIBRILLATION ABLATION N/A 10/28/2021   Procedure: ATRIAL FIBRILLATION ABLATION;  Surgeon: Cindie Ole DASEN, MD;  Location: [facility];  Service: Cardiovascular;  Laterality: N/A;   BACK SURGERY  1981/2011/2014   BLADDER SUSPENSION     BREAST RECONSTRUCTION  2004   with abd tissue   BREAST SURGERY Right    CARDIOVERSION N/A 04/30/2021   Procedure: CARDIOVERSION;  Surgeon: Alvan Dorn FALCON, MD;  Location: [facility];  Service: Endoscopy;  Laterality: N/A;   CARDIOVERSION N/A 05/16/2021   Procedure: CARDIOVERSION;  Surgeon: Alvan Dorn FALCON, MD;  Location: [facility];  Service: Endoscopy;  Laterality: N/A;   CARDIOVERSION N/A 08/13/2021   Procedure: CARDIOVERSION;  Surgeon: Kate Lonni CROME, MD;  Location: [facility] ENDOSCOPY;  Service: Cardiovascular;  Laterality: N/A;   CHOLECYSTECTOMY     COLONOSCOPY  12/2009   RMR: few pancolonic diverticula. next TCS 12/2014   COLONOSCOPY  06/14/2007   MFM:Ipfpwlupcz rectal polyp, status post cold biopsy removal/ Anal canal hemorrhoids/Left-sided diverticula Colonic mucosa appeared normal.Hyperplastic polyp.    COLONOSCOPY  01/18/2004   MFM:Dphfnpi diverticula/Internal hemorrhoids.  Otherwise normal rectum   COLONOSCOPY N/A 04/18/2013   Dr. Shaaron- normal rectum, scattered left sided diverticula the remainder of the colonic mucosa appeared normal   COLONOSCOPY N/A 08/18/2014   Procedure: COLONOSCOPY;  Surgeon: Lamar CHRISTELLA Shaaron, MD;  Location: [facility];  Service: Endoscopy;  Laterality: N/A;  1000   ESOPHAGOGASTRODUODENOSCOPY  06/14/2007   MFM:Mpaanw-opxz plaques in esophageal mucosa of uncertain significance brushed and biopsied/Normal stomach, normal first duodenum and second duodenoscopy. KOH negative. esophageal bx c/w GERD   ESOPHAGOGASTRODUODENOSCOPY N/A 08/18/2014   Procedure: ESOPHAGOGASTRODUODENOSCOPY (EGD);  Surgeon: Lamar CHRISTELLA Shaaron, MD;  Location: [facility];  Service: Endoscopy;  Laterality: N/A;   ESOPHAGOGASTRODUODENOSCOPY (EGD) WITH ESOPHAGEAL DILATION N/A 04/18/2013   Dr. Shaaron- normal, patent appearing, tubular esophagus, normal gastric mucosa, paptent pylorus, normal first and second portion of the duodenum  EYE SURGERY     lasik   FOOT SURGERY Right 2014   d/t plantar fascitis   GIVENS CAPSULE STUDY N/A 07/18/2013   L.Lewis PAC- markedly abnormal appearing gastric mucosa, sugnificant bile reflux, questionable subucosal small bowel mass versus extrinsic compression in the distal small bowel.- CTE= no small bowel mass or tumor seen.   LEFT HEART CATH AND CORONARY  ANGIOGRAPHY N/A 01/16/2020   Procedure: LEFT HEART CATH AND CORONARY ANGIOGRAPHY;  Surgeon: Verlin Lonni BIRCH, MD;  Location: [facility];  Service: Cardiovascular;  Laterality: N/A;   LEFT HEART CATH AND CORONARY ANGIOGRAPHY N/A 09/15/2023   Procedure: LEFT HEART CATH AND CORONARY ANGIOGRAPHY;  Surgeon: Anner Alm ORN, MD;  Location: [facility] IN[facility]AS[facility] CV LAB;  Service: Cardiovascular;  Laterality: N/A;   MALONEY DILATION N/A 08/18/2014   Procedure: AGAPITO DILATION;  Surgeon: Lamar CHRISTELLA Hollingshead, MD;  Location: [facility];  Service: Endoscopy;  Laterality: N/A;   MASTECTOMY     right side 12-13 years ago   MOUTH SURGERY     cyst removed in June 2022.   TOTAL HIP ARTHROPLASTY Left 02/29/2016   Procedure: TOTAL HIP ARTHROPLASTY ANTERIOR APPROACH;  Surgeon: Dempsey Sensor, MD;  Location: [facility] OR;  Service: Orthopedics;  Laterality: Left;   TOTAL HIP ARTHROPLASTY Right 01/05/2017   Procedure: TOTAL HIP ARTHROPLASTY ANTERIOR APPROACH;  Surgeon: Sensor Dempsey, MD;  Location: [facility] OR;  Service: Orthopedics;  Laterality: Right;  REQUEST 90 MINS   Social History   Socioeconomic History   Marital status: Widowed    Spouse name: Elsie   Number of children: 4   Years of education: 12th   Highest education level: Not on file  Occupational History    Employer: RETIRED  Tobacco Use   Smoking status: Former    Current packs/day: 0.00    Average packs/day: 1.5 packs/day for 30.0 years (45.0 ttl pk-yrs)    Types: Cigarettes    Start date: 04/28/1970    Quit date: 04/28/2000    Years since quitting: 23.5   Smokeless tobacco: Never   Tobacco comments:    Former smoker 07/30/21  Vaping Use   Vaping status: Never Used  Substance and Sexual Activity   Alcohol use: No   Drug use: No   Sexual activity: Not on file    Comment: hyst  Other Topics Concern   Not on file  Social History Narrative   Patient lives at home with spouse.   Caffeine Use: 2 cups of coffee daily   Social Drivers of Manufacturing engineer Strain: Low Risk  (05/27/2023)   Received from [facility]   Overall Financial Resource Strain (CARDIA)    Difficulty of Paying Living Expenses: Not hard at all  Food Insecurity: No Food Insecurity (05/27/2023)   Received from [facility]   Hunger Vital Sign    Within the past 12 months, you worried that your food would run out before you got the money to buy more.: Never true    Within the past 12 months, the food you bought just didn't last and you didn't have money to get more.: Never true  Transportation Needs: No Transportation Needs (05/27/2023)   Received from [facility] - Transportation    Lack of Transportation (Medical): No    Lack of Transportation (Non-Medical): No  Physical Activity: Sufficiently Active (10/13/2022)   [facility]   Exercise Vital Sign    On average, how many days per week  do you engage in moderate to strenuous exercise (like a brisk walk)?: 5 days    On average, how many minutes do you engage in exercise at this level?: 30 min  Stress: No Stress Concern Present (10/13/2022)   Received from [facility] - Occupational Stress Questionnaire    Feeling of Stress : Not at all  Social Connections: Moderately Integrated (10/13/2022)   Received from [facility]   Social Network    How would you rate your social network (family, work, friends)?: Adequate participation with social networks   Family History  Problem Relation Age of Onset   Coronary artery disease Mother    Coronary artery disease Father    Cancer Sister        lung cancer, two sisters   Lung cancer Sister    Coronary artery disease Brother    Colon cancer Brother        less than age 68   Cancer Brother    Emphysema Brother    Diabetes Son    Healthy Son    Healthy Son    Healthy Son    Healthy Son    Allergies  Allergen Reactions   Penicillins Anaphylaxis, Swelling and Other (See  Comments)    Blisters Has patient had a PCN reaction causing immediate rash, facial/tongue/throat swelling, SOB or lightheadedness with hypotension: Yes Has patient had a PCN reaction causing severe rash involving mucus membranes or skin necrosis: No Has patient had a PCN reaction that required hospitalization No Has patient had a PCN reaction occurring within the last 10 years: No If all of the above answers are NO, then may proceed with Cephalosporin use.    Eliquis  [Apixaban ] Hives   Prior to Admission medications   Medication Sig Start Date End Date Taking? Authorizing Provider  albuterol  (VENTOLIN  HFA) 108 (90 Base) MCG/ACT inhaler Inhale 1-2 puffs into the lungs every 6 (six) hours as needed for shortness of breath or wheezing.   Yes Debera Jayson MATSU, MD  budesonide-formoterol [facility]) 160-4.5 MCG/ACT inhaler Inhale 2 puffs into the lungs daily as needed (respiratory issues.). 09/08/23  Yes [provider]  diphenhydrAMINE  (BENADRYL ) 25 MG tablet Take 25 mg by mouth daily as needed for allergies.   Yes [provider]  gabapentin  (NEURONTIN ) 300 MG capsule Take 300 mg by mouth daily as needed (nerve pain). 09/22/23  Yes [provider]  HYDROcodone -acetaminophen  (NORCO) 7.5-325 MG tablet Take 1 tablet by mouth every 6 (six) hours as needed (pain.).   Yes [provider]  losartan  (COZAAR ) 50 MG tablet Take 1 tablet (50 mg total) by mouth daily. 11/27/22  Yes Cindie Ole DASEN, MD  metoprolol  succinate (TOPROL  XL) 25 MG 24 hr tablet Take 1 tablet (25 mg total) by mouth daily. Take in addition to 50 mg in the AM 10/13/23  Yes Strader, [place] M, PA-C  metoprolol  succinate (TOPROL -XL) 50 MG 24 hr tablet Take 1 tablet (50 mg total) by mouth in the morning and at bedtime. Take with or immediately following a meal. 06/23/23  Yes Miriam Norris, NP  nitroGLYCERIN  (NITROSTAT ) 0.4 MG SL tablet Place 1 tablet (0.4 mg total) under the tongue every 5 (five)  minutes as needed for chest pain. 08/06/23  Yes Miriam Norris, NP  omeprazole  (PRILOSEC) 20 MG capsule Take 20 mg by mouth daily as needed (indigestion/heartburn).   Yes [provider]  Rivaroxaban  (XARELTO ) 15 MG TABS tablet Take 1 tablet (15 mg total)  by mouth daily with supper. 05/20/23  Yes Miriam Norris, NP  rosuvastatin  (CRESTOR ) 5 MG tablet Take 1 tablet (5 mg total) by mouth at bedtime. 09/09/23 12/08/23 Yes Debera Jayson MATSU, MD  Blood Pressure Monitor DEVI 1 each by Does not apply route daily. 06/23/23   Miriam Norris, NP  docusate sodium  (COLACE) 100 MG capsule Take 100 mg by mouth daily as needed for mild constipation.    [provider]     All other systems have been reviewed and were otherwise negative with the exception of those mentioned in the HPI and as above.  Physical Exam: Vitals:   10/28/23 0650  BP: (!) (P) 159/69  Pulse: (P) 64  Resp: (P) 17  Temp: (P) 98.3 F (36.8 C)  SpO2: (P) 95%    Body mass index is 26.63 kg/m (pended).  General: Alert, no acute distress Cardiovascular: No pedal edema Respiratory: No cyanosis, no use of accessory musculature Skin: No lesions in the area of chief complaint Neurologic: Sensation intact distally Psychiatric: Patient is competent for consent with normal mood and affect Lymphatic: No axillary or cervical lymphadenopathy   Assessment/Plan: CERVICAL MYELOPATHY Plan for Procedure(s): ANTERIOR CERVICAL DECOMPRESSION/DISCECTOMY FUSION 2 LEVELS   Oneil LITTIE Priestly, MD 10/28/2023 8:15 AM

## 2023-10-28 NOTE — Telephone Encounter (Signed)
 Pt c/o medication issue:  1. Name of Medication:   Rivaroxaban  (XARELTO ) 15 MG TABS tablet   2. How are you currently taking this medication (dosage and times per day)?   Currently not taking  3. Are you having a reaction (difficulty breathing--STAT)?   4. What is your medication issue?    Patient stated she had surgery this morning and had stopped her Xarelto .  Patient wants to know when she should re-start this medication.  Patient noted she had been off this medication for the last 3 days.

## 2023-10-28 NOTE — Anesthesia Postprocedure Evaluation (Addendum)
 Anesthesia Post Note  Patient: [name]  Procedure(s) Performed: ANTERIOR CERVICAL DECOMPRESSION/DISCECTOMY FUSION 2 LEVELS     Patient location during evaluation: PACU Anesthesia Type: General Level of consciousness: awake and alert Pain management: pain level controlled Vital Signs Assessment: post-procedure vital signs reviewed and stable Respiratory status: spontaneous breathing, nonlabored ventilation, respiratory function stable and patient connected to nasal cannula oxygen Cardiovascular status: blood pressure returned to baseline and stable Postop Assessment: no apparent nausea or vomiting Anesthetic complications: no   No notable events documented.  Last Vitals:  Vitals:   10/28/23 1130 10/28/23 1145  BP: (!) 175/88 (!) 156/65  Pulse: 68 70  Resp: 16 17  Temp: 36.7 C   SpO2: 100% 91%             [name]

## 2023-10-28 NOTE — Anesthesia Procedure Notes (Signed)
 Procedure Name: Intubation Date/Time: 10/28/2023 8:49 AM  Performed by: Jolynn Mage, CRNAPre-anesthesia Checklist: Patient identified, Patient being monitored, Timeout performed, Emergency Drugs available and Suction available Patient Re-evaluated:Patient Re-evaluated prior to induction Oxygen Delivery Method: Circle system utilized Preoxygenation: Pre-oxygenation with 100% oxygen Induction Type: IV induction Ventilation: Mask ventilation without difficulty Laryngoscope Size: Mac, 3 and Glidescope Grade View: Grade I Tube type: Oral Tube size: 7.0 mm Number of attempts: 1 Airway Equipment and Method: Rigid stylet and Video-laryngoscopy Placement Confirmation: ETT inserted through vocal cords under direct vision, positive ETCO2 and breath sounds checked- equal and bilateral Secured at: 21 cm Tube secured with: Tape Dental Injury: Teeth and Oropharynx as per pre-operative assessment  Difficulty Due To: Difficult Airway- due to reduced neck mobility

## 2023-10-28 NOTE — Op Note (Signed)
 PATIENT NAME: [name]   MEDICAL RECORD NO.:   [id number]    DATE OF BIRTH: Dec 25, 1940   DATE OF PROCEDURE: 10/28/2023                               OPERATIVE REPORT     PREOPERATIVE DIAGNOSES: 1.  Progressive cervical myelopathy 2.  Severe spinal stenosis spanning C6/7, C7/T1    POSTOPERATIVE DIAGNOSES: 1.  Progressive cervical myelopathy 2.  Severe spinal stenosis spanning C6/7, C7/T1    PROCEDURE: 1. Anterior cervical decompression and fusion C6/7,C7/T1 2. Placement of anterior instrumentation, C6/7,C7/T1 (Of note, the anterior plate and screws were separate from, and not integral to, the intervertebral spacers) 3. Insertion of interbody device x 2 (Titan intervertebral spacers). 4. Intraoperative use of fluoroscopy. 5. Use of morselized allograft - ViviGen.   SURGEON:  Oneil Priestly, MD   ASSISTANT:  Ileana Clara, PA-C.   ANESTHESIA:  General endotracheal anesthesia.   COMPLICATIONS:  None.   DISPOSITION:  Stable.   ESTIMATED BLOOD LOSS:  Minimal.   INDICATIONS FOR SURGERY:  Briefly, Ms. Bibby is a pleasant 83  -year- old female, who did present to me with progressive cervical myelopathy.  The patient's MRI did reveal the findings noted above.  Given the patient's progressive symptoms, we did discuss proceeding with the procedure noted above.  The patient was fully aware of the risks and limitations of surgery as outlined in my preoperative note.   OPERATIVE DETAILS:  On 10/28/2023  , the patient was brought to surgery and general endotracheal anesthesia was administered.  The patient was placed supine on the hospital bed. The neck was gently extended.  All bony prominences were meticulously padded.  The neck was prepped and draped in the usual sterile fashion.  At this point, I did make a left-sided transverse incision.  The platysma was incised.  A Smith-Robinson approach was used and the anterior spine was identified. A self-retaining retractor was placed.   I then subperiosteally exposed the vertebral bodies from C6-T1.  Caspar pins were then placed into the C7 and T1 vertebral bodies and distraction was applied.  A thorough and complete C7-T1 intervertebral diskectomy was performed.  The posterior longitudinal ligament was identified and entered using a nerve hook.  I then used #1 followed by #2 Kerrison to perform a thorough and complete intervertebral diskectomy.  The spinal canal was thoroughly decompressed.  The endplates were then prepared and the appropriate-sized intervertebral spacer was then packed with ViviGen and tamped into position in the usual fashion (6 mm implant).  The lower Caspar pin was then removed and placed into the C6 vertebral body and once again, distraction was applied across the C6-7 intervertebral space.  I then again performed a thorough and complete diskectomy, thoroughly decompressing the spinal canal.  After preparing the endplates, the appropriate-sized intervertebral spacer was packed with ViviGen and tamped into position (5 mm implant).  The Caspar pins then were removed and bone wax was placed in their place.  The appropriate-sized anterior cervical plate was placed over the anterior spine.  14 mm variable angle screws were placed, 2 in each vertebral body from C6-T1 for a total of 6 vertebral body screws.  The screws were then locked to the plate using the Cam locking mechanism.  I was very pleased with the final fluoroscopic images.  The wound was then irrigated.  The wound was then explored for any undue bleeding  and there was no bleeding noted. The wound was then closed in layers using 2-0 Vicryl, followed by 4-0 Monocryl.  Benzoin and Steri-Strips were applied, followed by sterile dressing.  All instrument counts were correct at the termination of the procedure.   Of note, Ileana Clara, PA-C, was my assistant throughout surgery, and did aid in retraction, suctioning, and closure from start to  finish.     Oneil Priestly, MD

## 2023-10-28 NOTE — Transfer of Care (Signed)
 Immediate Anesthesia Transfer of Care Note  Patient: [name]  Procedure(s) Performed: ANTERIOR CERVICAL DECOMPRESSION/DISCECTOMY FUSION 2 LEVELS  Patient Location: PACU  Anesthesia Type:General  Level of Consciousness: awake, alert , patient cooperative, and responds to stimulation  Airway & Oxygen Therapy: Patient Spontanous Breathing and Patient connected to face mask oxygen  Post-op Assessment: Report given to RN, Post -op Vital signs reviewed and stable, and Patient moving all extremities X 4  Post vital signs: Reviewed and stable  Last Vitals:  Vitals Value Taken Time  BP 175/88 10/28/23 11:30  Temp    Pulse 71 10/28/23 11:35  Resp 19 10/28/23 11:35  SpO2 96 % 10/28/23 11:35  Vitals shown include unfiled device data.  Last Pain:  Vitals:   10/28/23 0656  TempSrc:   PainSc: 0-No pain         Complications: No notable events documented.

## 2023-10-29 ENCOUNTER — Encounter (HOSPITAL_COMMUNITY): Payer: Self-pay | Admitting: Orthopedic Surgery

## 2023-10-29 NOTE — Telephone Encounter (Signed)
Patient is calling follow up. Please advise.

## 2023-10-29 NOTE — Telephone Encounter (Signed)
 Patient states she will call the surgeon.

## 2023-10-29 NOTE — Telephone Encounter (Signed)
 Whoever did the surgery will need to advise on when it is safe to resume Xarelto .

## 2023-11-05 ENCOUNTER — Ambulatory Visit: Admitting: Nurse Practitioner

## 2023-11-17 ENCOUNTER — Telehealth: Payer: Self-pay | Admitting: Nurse Practitioner

## 2023-11-17 MED ORDER — RIVAROXABAN 15 MG PO TABS: 15.0000 mg | ORAL_TABLET | Freq: Every day | ORAL | 1 refills | Status: AC

## 2023-11-17 NOTE — Telephone Encounter (Signed)
 Prescription refill request for Xarelto  received.  Indication: AF Last office visit: 10/13/23  B Strader PA-C Weight: 74kg Age: 83 Scr: 1.07 on 10/20/23 CrCl: 46.54  Based on above findings Xarelto  15mg  daily is the appropriate dose.  Refill approved.

## 2023-11-17 NOTE — Telephone Encounter (Signed)
*  STAT* If patient is at the pharmacy, call can be transferred to refill team.   1. Which medications need to be refilled? (please list name of each medication and dose if known)  Rivaroxaban  (XARELTO ) 15 MG TABS tablet  2. Which pharmacy/location (including street and city if local pharmacy) is medication to be sent to? [facility] - [place], [place] - [address] Phone: [phone]  Fax: [phone]     3. Do they need a 30 day or 90 day supply? 90 Pt has 1 pill left

## 2023-11-19 ENCOUNTER — Ambulatory Visit: Attending: Nurse Practitioner | Admitting: Nurse Practitioner

## 2023-11-19 ENCOUNTER — Encounter: Payer: Self-pay | Admitting: Nurse Practitioner

## 2023-11-19 VITALS — BP 128/60 | HR 75 | Ht 65.5 in | Wt 155.2 lb

## 2023-11-19 DIAGNOSIS — I2510 Atherosclerotic heart disease of native coronary artery without angina pectoris: Secondary | ICD-10-CM

## 2023-11-19 DIAGNOSIS — I4891 Unspecified atrial fibrillation: Secondary | ICD-10-CM | POA: Diagnosis not present

## 2023-11-19 DIAGNOSIS — R0789 Other chest pain: Secondary | ICD-10-CM

## 2023-11-19 DIAGNOSIS — I10 Essential (primary) hypertension: Secondary | ICD-10-CM

## 2023-11-19 DIAGNOSIS — I491 Atrial premature depolarization: Secondary | ICD-10-CM

## 2023-11-19 NOTE — Progress Notes (Signed)
 Cardiology Office Note:  .   Date: 11/19/2023 ID:  [name], DOB May 31, 1940, MRN [id number] PCP: Suanne Pfeiffer, NP  [facility] Providers Cardiologist:  Jayson Sierras, MD Electrophysiologist:  OLE ONEIDA HOLTS, MD    History of Present Illness: .   [name] is a 83 y.o. female with a PMH of CAD, persistent A-fib, status post ablation in August 2023 (followed by EP), hypertension, asthma, COPD, GERD, hypothyroidism, rheumatoid arthritis/osteoarthritis, history of breast cancer, CKD, chronic pain syndrome, lumbar radiculopathy, and dependent leg edema, who presents today for post cardiac cath follow-up.  Last seen by Dr. Sierras on December 30, 2022.  She was doing very well at that time.  05/14/2023 - Today she presents for follow-up.  She states she has not been doing well recently. Admits to recent DOE for the past 2 weeks, believes she is having issues with A-fib. She admits to recent swelling noted along her lower extremities bilaterally. BP is well-controlled at home even though BP is up today in the office.  She also admits to difficulty affording Xarelto , wants to know if she can have a more affordable medication option. Denies any chest pain, syncope, presyncope, dizziness, orthopnea, PND, significant weight changes, acute bleeding, or claudication.  06/23/2023 -presents today for follow-up.  Tells me she has not been doing well recently.  Her chief concerns are leg swelling that she noticed over the past weekend as well as nausea (denies any vomiting), and increased blood pressure.  She is unsure what is causing her high blood pressure as she has been reducing her salt intake.  Unsure what is causing her nausea as she denies any fever, chills, or any recent intolerance to certain meals. Denies any chest pain, shortness of breath, palpitations, syncope, presyncope, dizziness, orthopnea, PND, significant weight changes, acute bleeding, or claudication.  08/06/2023  -today she presents for follow-up.  Doing some better in regards to leg swelling and denies any more nausea -she says her leg edema was noticed when legs were in dependent position, has had recent workup that showed lower extremity Doppler was negative for DVT.  Blood pressure has improved since last office visit, however she does admit to exertional chest tightness, noticed while going to get her mail.  She says when this happens, she experiences tightness where she has to sit down and rest, brief in duration, located along the center of her chest and denies any radiation.  She does admit to dyspnea on exertion with walking short distances.  Says she loves to exercise, but cannot do this like she wants to as she says she gives out easily and experiences shortness of breath, says has been going on for a while and seems to be a little worse recently.  She underwent left heart cath on Sep 15, 2023. See full report below.   11/19/2023 -  Here for follow-up. She admits to occasional right sided chest pain behind right breast tissue. Says it feels like indigestion, had a significant episode a while ago. Denies any specific triggers. Episodes last typically 2-3 hours over time, said last episode she had never had before. Left knee has been giving out, she is scheduled for an MRI next Tuesday. Denies any shortness of breath, palpitations, syncope, presyncope, dizziness, orthopnea, PND, swelling or significant weight changes, acute bleeding, or claudication.  ROS: Negative.  See HPI.  Studies Reviewed: SABRA    EKG: EKG is not ordered today.   LHC 08/2023:    Mid RCA lesion  is 60% stenosed. -Mild progression of disease; Prox RCA lesion is 20% stenosed.   Mid LAD lesion is 20% stenosed.   Prox Cx lesion is 50% stenosed. LPAV lesion is 60% stenosed (stable upon improved flow)   LV end diastolic pressure is normal.   There is no aortic valve stenosis.   Dominance: Right  POST-OPERATIVE DIAGNOSIS:    Nonobstructive coronary disease-relatively stable compared to 2021 with exception of progression of disease in the mid to distal RCA from 20% to 50%, stable proximal LCx 50% stenosis with ostium of AV groove circumflex 60% (previously read as 80%). Normal LVEDP     RECOMMENDATIONS   In the absence of any other complications or medical issues, we expect the patient to be ready for discharge from a cath perspective on 09/15/2023.   Recommend Aspirin  81mg  daily for moderate CAD.  Lexiscan  07/2023:      Stress ECG is negative for ischemia and arrhythmias   LV perfusion is normal. There is no evidence of ischemia. There is no evidence of infarction.   Left ventricular function is normal. Nuclear stress EF: 64%.   Findings are consistent with no ischemia and no infarction. The study is low risk.  Cardiac monitor 05/2023: ZIO monitor reviewed.  11 days, 2 hours analyzed.   Predominant rhythm is sinus with heart rate ranging from 45 bpm up to 114 bpm and average heart rate 66 bpm. There were frequent PACs representing 6.8% total beats with otherwise occasional atrial couplets and triplets. There were rare PVCs including ventricular couplets representing less than 1% total beats. Multiple (2425) very brief episodes of PSVT were noted, the longest of which was only 17 beats.  There were no sustained arrhythmias.  Some of these episodes occurred with IVCD/aberrancy. There were no pauses or high degree heart block.  Echocardiogram 05/2023: 1. Left ventricular ejection fraction, by estimation, is 60 to 65%. The  left ventricle has normal function. The left ventricle has no regional  wall motion abnormalities. There is mild left ventricular hypertrophy.  Left ventricular diastolic parameters  are consistent with Grade I diastolic dysfunction (impaired relaxation).  Elevated left ventricular end-diastolic pressure. The average left  ventricular global longitudinal strain is -21.1 %. The global  longitudinal  strain is normal.   2. Right ventricular systolic function is normal. The right ventricular  size is normal.   3. The mitral valve is normal in structure. Trivial mitral valve  regurgitation. No evidence of mitral stenosis.   4. The aortic valve is tricuspid. Aortic valve regurgitation is not  visualized. No aortic stenosis is present.   5. The inferior vena cava is normal in size with greater than 50%  respiratory variability, suggesting right atrial pressure of 3 mmHg.   6. Cannot exclude a small PFO.   Comparison(s): No significant change from prior study.    Echocardiogram 10/2021:  1. No significant pericardial effusion.   2. Left ventricular ejection fraction, by estimation, is 60 to 65%. The  left ventricle has normal function. The left ventricle has no regional  wall motion abnormalities. Left ventricular diastolic parameters are  indeterminate.   3. Right ventricular systolic function is normal. The right ventricular  size is normal.   4. Left atrial size was mildly dilated.   5. The mitral valve is normal in structure. Mild mitral valve  regurgitation. No evidence of mitral stenosis.   6. The aortic valve is normal in structure. Aortic valve regurgitation is  not visualized. No aortic stenosis is  present.   7. The inferior vena cava is normal in size with greater than 50%  respiratory variability, suggesting right atrial pressure of 3 mmHg.   CT cardiac 09/2021:  IMPRESSION: 1. There is normal pulmonary vein drainage into the left atrium with ostial measurements above.   2. There is no thrombus in the left atrial appendage.   3. The esophagus runs in the left atrial midline and is not in proximity to any of the pulmonary vein ostia.   4. No PFO/ASD.   5. Normal coronary origin. Right dominance.   6. CAC score of 1362 Agatston units which is 93rd percentile for age-, race-, and sex-matched controls.  IMPRESSION: 1. Signs signs of pulmonary  emphysema with potential interstitial lung disease. Correlate with respiratory symptoms and consider pulmonology follow-up with high-resolution chest CT as warranted. 2. Coronary artery disease and aortic atherosclerosis. 3. Post RIGHT mastectomy and breast reconstruction also with LEFT breast implant.  LHC 12/2019:  Mid RCA lesion is 40% stenosed. Prox RCA lesion is 20% stenosed. Dist Cx lesion is 80% stenosed. Prox Cx lesion is 50% stenosed. Mid LAD lesion is 20% stenosed.   1. Moderate stenosis mid Circumflex. This lesion is not flow limiting by functional assessment (DFR 0.96). The small caliber distal Circumflex has a severe stenosis but this vessel appears to be too small for PCI (1.75 mm vessel).  2. Mild plaque in the LAD 3. The RCA is a large dominant vessel with mild to moderate calcified plaque in the mid and distal vessel.    Recommendations: Medical management of CAD. The stenosis in the mid Circumflex is moderate angiographically but by functional flow wire assessment is not flow limiting (DFR 0.96). The small caliber distal Circumflex has a severe stenosis but this vessel appears to be too small for PCI (1.75 mm vessel).   Lexiscan  12/2019:  No diagnostic ST segment changes to indicate ischemia. Occasional PACs. Small, mild intensity, apical anteroseptal defect that is partially reversible in the setting of breast attenuation, mild ischemia not excluded. TID ratio increased at 1.31 so cannot exclude the possibility of more diffuse subendocardial ischemia. This is a high risk study due to increased TID ratio. Nuclear stress EF: 66%.  Physical Exam:   VS:  BP 128/60   Pulse 75   Ht 5' 5.5 (1.664 m)   Wt 155 lb 3.2 oz (70.4 kg)   SpO2 97%   BMI 25.43 kg/m    Wt Readings from Last 3 Encounters:  11/19/23 155 lb 3.2 oz (70.4 kg)  10/28/23 (P) 160 lb (72.6 kg)  10/20/23 161 lb 12.8 oz (73.4 kg)     GEN: Well nourished, well developed in no acute distress NECK: No JVD;  No carotid bruits CARDIAC: S1/S2, irregular rhythm and regular rate, no murmurs, rubs, gallops RESPIRATORY:  Clear to auscultation without rales, wheezing or rhonchi  ABDOMEN: Soft, non-tender, non-distended EXTREMITIES:  No edema; No deformity   ASSESSMENT AND PLAN: .     Atypical chest pain, CAD Admits to recent atpycial CP - see HPI. Denies any specific triggers.  See most recent results from [facility] noted above. Did discuss medication adjustment including possibly adding Imdur, patient declines at this time.  Can consider this if needed in the future.  Continue current medication regimen.  Heart healthy diet encouraged.  Care and ED precautions discussed.  A-fib, s/p ablation in 11/2021, long term use of OAC, frequent PAC's Denies any tachycardia or palpitations.  Heart rate is well-controlled today, however has  frequent PAC's in form of atrial bigeminy as noted on past EKG. See recent monitor report noted above.  Continue current medication regimen as she is asymptomatic.  Compliant and tolerating well and denies any bleeding issues.  She is on appropriate dosage.  Hypertension Blood pressure stable and at goal. Discussed goal SBP < 140. BP well-controlled at home. Discussed to monitor BP at home at least 2 hours after medications and sitting for 5-10 minutes. Heart healthy diet and regular cardiovascular exercise encouraged.  No medication changes at this time.  I spent a total duration of 35 minutes reviewing prior notes, reviewing outside records including  labs, face-to-face counseling of medical condition, pathophysiology, evaluation, management, and documenting the findings in the note.   Dispo: Follow-up with me/APP in 4-6 weeks or sooner if anything changes.  Signed, Almarie Crate, NP

## 2023-11-19 NOTE — Patient Instructions (Addendum)
 Medication Instructions:  Your physician recommends that you continue on your current medications as directed. Please refer to the Current Medication list given to you today.  Labwork: None   Testing/Procedures: None   Follow-Up: Your physician recommends that you schedule a follow-up appointment in: 4-6 weeks   Any Other Special Instructions Will Be Listed Below (If Applicable).  If you need a refill on your cardiac medications before your next appointment, please call your pharmacy.

## 2023-11-30 ENCOUNTER — Telehealth: Payer: Self-pay | Admitting: Cardiology

## 2023-11-30 NOTE — Telephone Encounter (Signed)
   Pre-operative Risk Assessment    Patient Name: [name]  DOB: 05-Jun-1940 MRN: [id number]      Request for Surgical Clearance    Procedure:  Right Total Knee Replacement  Date of Surgery:  Clearance 12/16/23                                 Surgeon:  Dr. Norleen Gavel Surgeon's Group or Practice Name:  [facility] Phone number:  [phone] Fax number:  [phone]   Type of Clearance Requested:   - Medical    Type of Anesthesia:  Spinal   Additional requests/questions:    SignedRojelio Kays   11/30/2023, 12:33 PM

## 2023-12-01 NOTE — Telephone Encounter (Signed)
 [name],  [name] is requesting preoperative cardiac evaluation for right total knee replacement.  Procedure has been scheduled for 12/16/2023.  She was recently seen by you in clinic on 11/19/2023.  During that time she was having occasional right-sided chest pain.  This was located behind her right breast.  She felt the pain was related to indigestion.  She otherwise remained stable from a cardiac standpoint.  Would you be able to comment on cardiac risk for upcoming procedure?  Thank you for your help.  Please direct your response to CV DIV preop pool.  [name]. [name] NP-C     12/01/2023, 12:38 PM [facility] [address] Office [phone] Fax [phone]

## 2023-12-01 NOTE — Telephone Encounter (Signed)
 Patient with diagnosis of A  on Xarelto  for anticoagulation.    Procedure: Right Total Knee Replacement  Date of procedure: 12/16/23   CHA2DS2-VASc Score = 5  This indicates a 7.2% annual risk of stroke. The patient's score is based upon: CHF History: 0 HTN History: 1 Diabetes History: 0 Stroke History: 0 Vascular Disease History: 1 Age Score: 2 Gender Score: 1    CrCl 44 ml/min Platelet count 276K   Per office protocol, patient can hold Xarelto  for 3 days prior to procedure.     **This guidance is not considered finalized until pre-operative APP has relayed final recommendations.**

## 2023-12-03 ENCOUNTER — Ambulatory Visit: Admitting: Nurse Practitioner

## 2023-12-09 ENCOUNTER — Other Ambulatory Visit: Payer: Self-pay | Admitting: Orthopedic Surgery

## 2023-12-14 NOTE — Progress Notes (Signed)
  Medicare AWV  [name] is a 83 y.o. female who presents for her subsequent annual wellness visit for Medicare.  Clinical documentation was reviewed and is accessible via encounter-level attachments.    Any physical exam components or additional concerns beyond the scope of the Annual Wellness Visit may be documented in a separate note within this encounter.  Having upcoming total L knee arthroplasty with [facility] in [place], schedule TBD pending this pre-op  eval. Chronic afib on xarelto  - will defer to CPP referral to safely ensure transition off temporarily for safe surgical procedure. EKG afib with rate control  Medicare Required Components     Reviewed and updated this visit by provider: Tobacco  Allergies  Meds  Problems  Med Hx  Surg Hx  Fam Hx  PDMP       Medicare required assessment: Future risk of substance use disorder / overdose risk of 79% is higher (>=20%).    Patient Care Team: Charmaine Heller, NP as PCP - General (Nurse Practitioner)  Vitals   Vitals:   12/03/23 0856  BP: 120/62  Pulse: 82  Temp: 97.8 F (36.6 C)  TempSrc: Temporal  Resp: 20  Height: 5' 5 (1.651 m)  Weight: 151 lb 3.2 oz (68.6 kg)  SpO2: 98%  BMI (Calculated): 25.2  PainSc:   9  PainLoc: Knee    Disposition   1. Chronic obstructive pulmonary disease, unspecified COPD type (*) (Primary) 2. Preop examination -     POCT ACR URINE -     Anemia Profile B; Future -     Comprehensive Metabolic Panel; Future -     POCT A1C -     ECG 12 lead -     Ambulatory Referral to CPP Medication Management 3. Mixed hyperlipidemia -     Lipid Panel With LDL/HDL Ratio; Future 4. Hypothyroidism due to medication -     TSH Reflex T4Free/T3 Hormone; Future 5. Abnormal serum enzyme level, unspecified -     Anemia Profile B; Future 6. Encounter for screening for diabetes mellitus -     POCT A1C 7. Anticoagulated -     Ambulatory Referral to CPP Medication Management 8.  Localized swelling of toe of left foot -     gabapentin  (NEURONTIN ) 300 mg capsule; Take one capsule (300 mg dose) by mouth at bedtime., Starting Thu 12/03/2023, Normal 9. Primary hypertension -     losartan  potassium (COZAAR ) 50 mg tablet; Take one tablet (50 mg dose) by mouth daily., Starting Thu 12/03/2023, Normal   Follow up in about 1 year (around 12/05/2024) for Medicare annual wellness visit.   Health maintenance issues were discussed with the patient.  A written plan was provided to the patient in the form of patient instructions in the After Visit Summary document.

## 2023-12-14 NOTE — Patient Instructions (Signed)
 SURGICAL WAITING ROOM VISITATION  Patients having surgery or a procedure may have no more than 2 support people in the waiting area - these visitors may rotate.    Children under the age of 9 must have an adult with them who is not the patient.  Visitors with respiratory illnesses are discouraged from visiting and should remain at home.  If the patient needs to stay at the hospital during part of their recovery, the visitor guidelines for inpatient rooms apply. Pre-op  nurse will coordinate an appropriate time for 1 support person to accompany patient in pre-op .  This support person may not rotate.    Please refer to the [facility] website for the visitor guidelines for Inpatients (after your surgery is over and you are in a regular room).       Your procedure is scheduled on: 12/21/23   Report to [facility] Main Entrance    Report to admitting at [facility]   Call this number if you have problems the morning of surgery [phone]   Do not eat food :After Midnight.   After Midnight you may have the following liquids until 7:30 AM DAY OF SURGERY  Water  Non-Citrus Juices (without pulp, NO RED-Apple, White grape, White cranberry) Black Coffee (NO MILK/CREAM OR CREAMERS, sugar ok)  Clear Tea (NO MILK/CREAM OR CREAMERS, sugar ok) regular and decaf                             Plain Jell-O (NO RED)                                           Fruit ices (not with fruit pulp, NO RED)                                     Popsicles (NO RED)                                                               Sports drinks like Gatorade (NO RED)                  The day of surgery:  Drink ONE (1) Pre-Surgery Clear Ensure at 7:30 AM the morning of surgery. Drink in one sitting. Do not sip.  This drink was given to you during your hospital  pre-op  appointment visit. Nothing else to drink after completing the  Pre-Surgery Clear Ensure.            Oral Hygiene is also important to reduce  your risk of infection.                                    Remember - BRUSH YOUR TEETH THE MORNING OF SURGERY WITH YOUR REGULAR TOOTHPASTE  DENTURES WILL BE REMOVED PRIOR TO SURGERY PLEASE DO NOT APPLY Poly grip OR ADHESIVES!!!   Stop all vitamins and herbal supplements 7 days before surgery.   Take these medicines the morning of surgery with A SIP  OF WATER : Gabapentin , Metoprolol , omeprazole (prilosec), Rosuvastatin , Inhalers             You may not have any metal on your body including hair pins, jewelry, and body piercing             Do not wear make-up, lotions, powders, perfumes/cologne, or deodorant  Do not wear nail polish including gel and S&S, artificial/acrylic nails, or any other type of covering on natural nails including finger and toenails. If you have artificial nails, gel coating, etc. that needs to be removed by a nail salon please have this removed prior to surgery or surgery may need to be canceled/ delayed if the surgeon/ anesthesia feels like they are unable to be safely monitored.   Do not shave  48 hours prior to surgery.    Do not bring valuables to the hospital. [place] IS NOT             RESPONSIBLE   FOR VALUABLES.   Contacts, glasses, dentures or bridgework may not be worn into surgery.  DO NOT BRING YOUR HOME MEDICATIONS TO THE HOSPITAL. PHARMACY WILL DISPENSE MEDICATIONS LISTED ON YOUR MEDICATION LIST TO YOU DURING YOUR ADMISSION IN THE HOSPITAL!    Patients discharged on the day of surgery will not be allowed to drive home.  Someone NEEDS to stay with you for the first 24 hours after anesthesia.   Special Instructions: Bring a copy of your healthcare power of attorney and living will documents the day of surgery if you haven't scanned them before.              Please read over the following fact sheets you were given: IF YOU HAVE QUESTIONS ABOUT YOUR PRE-OP  INSTRUCTIONS PLEASE CALL [phone] Verneita   If you received a COVID test during your pre-op   visit  it is requested that you wear a mask when out in public, stay away from anyone that may not be feeling well and notify your surgeon if you develop symptoms. If you test positive for Covid or have been in contact with anyone that has tested positive in the last 10 days please notify you surgeon.      Pre-operative 5 CHG Bath Instructions   You can play a key role in reducing the risk of infection after surgery. Your skin needs to be as free of germs as possible. You can reduce the number of germs on your skin by washing with CHG (chlorhexidine  gluconate) soap before surgery. CHG is an antiseptic soap that kills germs and continues to kill germs even after washing.   DO NOT use if you have an allergy to chlorhexidine /CHG or antibacterial soaps. If your skin becomes reddened or irritated, stop using the CHG and notify one of our RNs at [phone].   Please shower with the CHG soap starting 4 days before surgery using the following schedule:     Please keep in mind the following:  DO NOT shave, including legs and underarms, starting the day of your first shower.   You may shave your face at any point before/day of surgery.  Place clean sheets on your bed the day you start using CHG soap. Use a clean washcloth (not used since being washed) for each shower. DO NOT sleep with pets once you start using the CHG.   CHG Shower Instructions:  If you choose to wash your hair and private area, wash first with your normal shampoo/soap.  After you use shampoo/soap, rinse your hair and  body thoroughly to remove shampoo/soap residue.  Turn the water  OFF and apply about 3 tablespoons (45 ml) of CHG soap to a CLEAN washcloth.  Apply CHG soap ONLY FROM YOUR NECK DOWN TO YOUR TOES (washing for 3-5 minutes)  DO NOT use CHG soap on face, private areas, open wounds, or sores.  Pay special attention to the area where your surgery is being performed.  If you are having back surgery, having someone wash  your back for you may be helpful. Wait 2 minutes after CHG soap is applied, then you may rinse off the CHG soap.  Pat dry with a clean towel  Put on clean clothes/pajamas   If you choose to wear lotion, please use ONLY the CHG-compatible lotions on the back of this paper.     Additional instructions for the day of surgery: DO NOT APPLY any lotions, deodorants, cologne, or perfumes.   Put on clean/comfortable clothes.  Brush your teeth.  Ask your nurse before applying any prescription medications to the skin.      CHG Compatible Lotions   Aveeno Moisturizing lotion  Cetaphil Moisturizing Cream  Cetaphil Moisturizing Lotion  Clairol Herbal Essence Moisturizing Lotion, Dry Skin  Clairol Herbal Essence Moisturizing Lotion, Extra Dry Skin  Clairol Herbal Essence Moisturizing Lotion, Normal Skin  Curel Age Defying Therapeutic Moisturizing Lotion with Alpha Hydroxy  Curel Extreme Care Body Lotion  Curel Soothing Hands Moisturizing Hand Lotion  Curel Therapeutic Moisturizing Cream, Fragrance-Free  Curel Therapeutic Moisturizing Lotion, Fragrance-Free  Curel Therapeutic Moisturizing Lotion, Original Formula  Eucerin Daily Replenishing Lotion  Eucerin Dry Skin Therapy Plus Alpha Hydroxy Crme  Eucerin Dry Skin Therapy Plus Alpha Hydroxy Lotion  Eucerin Original Crme  Eucerin Original Lotion  Eucerin Plus Crme Eucerin Plus Lotion  Eucerin TriLipid Replenishing Lotion  Keri Anti-Bacterial Hand Lotion  Keri Deep Conditioning Original Lotion Dry Skin Formula Softly Scented  Keri Deep Conditioning Original Lotion, Fragrance Free Sensitive Skin Formula  Keri Lotion Fast Absorbing Fragrance Free Sensitive Skin Formula  Keri Lotion Fast Absorbing Softly Scented Dry Skin Formula  Keri Original Lotion  Keri Skin Renewal Lotion Keri Silky Smooth Lotion  Keri Silky Smooth Sensitive Skin Lotion  Nivea Body Creamy Conditioning Oil  Nivea Body Extra Enriched Lotion  Nivea Body Original  Lotion  Nivea Body Sheer Moisturizing Lotion Nivea Crme  Nivea Skin Firming Lotion  NutraDerm 30 Skin Lotion  NutraDerm Skin Lotion  NutraDerm Therapeutic Skin Cream  NutraDerm Therapeutic Skin Lotion  ProShield Protective Hand Cream    Incentive Spirometer  An incentive spirometer is a tool that can help keep your lungs clear and active. This tool measures how well you are filling your lungs with each breath. Taking long deep breaths may help reverse or decrease the chance of developing breathing (pulmonary) problems (especially infection) following: A long period of time when you are unable to move or be active. BEFORE THE PROCEDURE  If the spirometer includes an indicator to show your best effort, your nurse or respiratory therapist will set it to a desired goal. If possible, sit up straight or lean slightly forward. Try not to slouch. Hold the incentive spirometer in an upright position. INSTRUCTIONS FOR USE  Sit on the edge of your bed if possible, or sit up as far as you can in bed or on a chair. Hold the incentive spirometer in an upright position. Breathe out normally. Place the mouthpiece in your mouth and seal your lips tightly around it. Breathe in slowly and as  deeply as possible, raising the piston or the ball toward the top of the column. Hold your breath for 3-5 seconds or for as long as possible. Allow the piston or ball to fall to the bottom of the column. Remove the mouthpiece from your mouth and breathe out normally. Rest for a few seconds and repeat Steps 1 through 7 at least 10 times every 1-2 hours when you are awake. Take your time and take a few normal breaths between deep breaths. The spirometer may include an indicator to show your best effort. Use the indicator as a goal to work toward during each repetition. After each set of 10 deep breaths, practice coughing to be sure your lungs are clear. If you have an incision (the cut made at the time of surgery),  support your incision when coughing by placing a pillow or rolled up towels firmly against it. Once you are able to get out of bed, walk around indoors and cough well. You may stop using the incentive spirometer when instructed by your caregiver.  RISKS AND COMPLICATIONS Take your time so you do not get dizzy or light-headed. If you are in pain, you may need to take or ask for pain medication before doing incentive spirometry. It is harder to take a deep breath if you are having pain. AFTER USE Rest and breathe slowly and easily. It can be helpful to keep track of a log of your progress. Your caregiver can provide you with a simple table to help with this. If you are using the spirometer at home, follow these instructions: SEEK MEDICAL CARE IF:  You are having difficultly using the spirometer. You have trouble using the spirometer as often as instructed. Your pain medication is not giving enough relief while using the spirometer. You develop fever of 100.5 F (38.1 C) or higher. SEEK IMMEDIATE MEDICAL CARE IF:  You cough up bloody sputum that had not been present before. You develop fever of 102 F (38.9 C) or greater. You develop worsening pain at or near the incision site. MAKE SURE YOU:  Understand these instructions. Will watch your condition. Will get help right away if you are not doing well or get worse. Document Released: 08/25/2006 Document Revised: 07/07/2011 Document Reviewed: 10/26/2006 [facility] Patient Information 2014 [place], [place].

## 2023-12-14 NOTE — Progress Notes (Signed)
 COVID Vaccine received:  []  No [x]  Yes Date of any COVID positive Test in last 90 days: no PCP - Charmaine Heller NP Cardiologist - Dr. Jayson Sierras Electrophys.[place] Ole Holts MD  Chest x-ray -  EKG -  09/15/23 Epic Stress Test - 08/14/23 Epic ECHO - 06/02/23 Epic Cardiac Cath - 09/15/23 Epic  Bowel Prep - [x]  No  []   Yes ______  Pacemaker / ICD device [x]  No []  Yes   Spinal Cord Stimulator:[x]  No []  Yes       History of Sleep Apnea? [x]  No []  Yes   CPAP used?- [x]  No []  Yes    Does the patient monitor blood sugar?          [x]  No []  Yes  []  N/A  Patient has: [x]  NO Hx DM   []  Pre-DM                 []  DM1  []   DM2 Does patient have a [employer] or Dexacom? []  No []  Yes   Fasting Blood Sugar Ranges-  Checks Blood Sugar _____ times a day  GLP1 agonist / usual dose - no GLP1 instructions:  SGLT-2 inhibitors / usual dose - no SGLT-2 instructions:   Blood Thinner / Instructions:Xarelto  last dose to be 8/21 8PM Aspirin  Instructions:none  Comments:   Activity level: Patient is able to climb a flight of stairs without difficulty; [x]  No CP  [x]  No SOB,   Patient can  perform ADLs without assistance.   Anesthesia review: CAD, MI, HTN, Emphysema, A-fib, On Xarelto   Patient denies shortness of breath, fever, cough and chest pain at PAT appointment.  Patient verbalized understanding and agreement to the Pre-Surgical Instructions that were given to them at this PAT appointment. Patient was also educated of the need to review these PAT instructions again prior to his/her surgery.I reviewed the appropriate phone numbers to call if they have any and questions or concerns.

## 2023-12-15 ENCOUNTER — Other Ambulatory Visit: Payer: Self-pay

## 2023-12-15 ENCOUNTER — Ambulatory Visit (HOSPITAL_COMMUNITY)
Admission: RE | Admit: 2023-12-15 | Discharge: 2023-12-15 | Disposition: A | Discharge status: Home / Self Care | Source: Ambulatory Visit | Attending: Orthopedic Surgery | Admitting: Orthopedic Surgery

## 2023-12-15 ENCOUNTER — Telehealth: Payer: Self-pay | Admitting: Cardiology

## 2023-12-15 ENCOUNTER — Encounter (HOSPITAL_COMMUNITY)
Admission: RE | Admit: 2023-12-15 | Discharge: 2023-12-15 | Disposition: A | Discharge status: Home / Self Care | Source: Ambulatory Visit | Attending: Orthopedic Surgery | Admitting: Orthopedic Surgery

## 2023-12-15 VITALS — BP 190/77 | HR 55 | Temp 97.5°F | Resp 20 | Ht 65.5 in | Wt 150.0 lb

## 2023-12-15 DIAGNOSIS — I10 Essential (primary) hypertension: Secondary | ICD-10-CM | POA: Diagnosis present

## 2023-12-15 DIAGNOSIS — Z01818 Encounter for other preprocedural examination: Secondary | ICD-10-CM

## 2023-12-15 LAB — CBC
HCT: 34.7 % — ABNORMAL LOW (ref 36.0–46.0)
Hemoglobin: 11 g/dL — ABNORMAL LOW (ref 12.0–15.0)
MCH: 30.5 pg (ref 26.0–34.0)
MCHC: 31.7 g/dL (ref 30.0–36.0)
MCV: 96.1 fL (ref 80.0–100.0)
Platelets: 397 K/uL (ref 150–400)
RBC: 3.61 MIL/uL — ABNORMAL LOW (ref 3.87–5.11)
RDW: 13 % (ref 11.5–15.5)
WBC: 9.1 K/uL (ref 4.0–10.5)
nRBC: 0 % (ref 0.0–0.2)

## 2023-12-15 LAB — BASIC METABOLIC PANEL WITH GFR
Anion gap: 10 (ref 5–15)
BUN: 18 mg/dL (ref 8–23)
CO2: 25 mmol/L (ref 22–32)
Calcium: 9.8 mg/dL (ref 8.9–10.3)
Chloride: 104 mmol/L (ref 98–111)
Creatinine, Ser: 0.9 mg/dL (ref 0.44–1.00)
GFR, Estimated: >60 mL/min (ref >60)
Glucose, Bld: 106 mg/dL — ABNORMAL HIGH (ref 70–99)
Potassium: 4.6 mmol/L (ref 3.5–5.1)
Sodium: 139 mmol/L (ref 135–145)

## 2023-12-15 LAB — SURGICAL PCR SCREEN
MRSA, PCR: NEGATIVE
Staphylococcus aureus: NEGATIVE

## 2023-12-15 NOTE — Progress Notes (Signed)
 Pt. Here today for PST for Knee surgery. Her B/P at start of visit was 190/77. At end of visit it was 197/91. She said she feels fine. She denies a H/A or visual disturbances. When taken manually it was 214/88. Informed Jessica PA. She spoke with pt. And instructed her to monitor B/P today and if it stays up or she has H/a, blurred vision to call her MD. She verbalized understanding.

## 2023-12-15 NOTE — Telephone Encounter (Signed)
 igned      Pt. Here today for PST for Knee surgery. Her B/P at start of visit was 190/77. At end of visit it was 197/91. She said she feels fine. She denies a H/A or visual disturbances. When taken manually it was 214/88. Informed Jessica PA. She spoke with pt. And instructed her to monitor B/P today and if it stays up or she has H/a, blurred vision to call her MD. She verbalized understanding.        Electronically signed by Henry Verneita GAILS, RN at      Seen In pre-surgical testing today, see note above.  Please advise.

## 2023-12-15 NOTE — Telephone Encounter (Signed)
 Pt c/o BP issue: STAT if pt c/o blurred vision, one-sided weakness or slurred speech.  STAT if BP is GREATER than 180/120 TODAY.  STAT if BP is LESS than 90/60 and SYMPTOMATIC TODAY  1. What is your BP concern? BP too high   2. Have you taken any BP medication today? Yes    3. What are your last 5 BP readings? 190/80 200/80 214/80   4. Are you having any other symptoms (ex. Dizziness, headache, blurred vision, passed out)? No

## 2023-12-15 NOTE — Telephone Encounter (Signed)
 Spoke with patient at time of incoming call.  Had her pre-op  visit today for her upcoming left knee replacement surgery this Monday, 12/21/23.  Was told to let us  know her BP was elevated & she was in afib.  Please review EKG in epic - done this morning.  States since she has got home & settled down her BP is 157/83  64.  States she has been in a lot of pain as she had neck surgery in July & recently had fluid drawn off that knee.  Has also been taking pain medications for this.  No c/o chest pain or dizziness.  States that she typically has SOB when her afib flares up but states she can hardly tell when or not she is in it.  States she feels fine today.  Denies any racing, pounding or palpitations.

## 2023-12-16 NOTE — Progress Notes (Signed)
 Anesthesia Chart Review   Case: [id number] Date/Time: 12/21/23 0845   Procedure: ARTHROPLASTY, KNEE, TOTAL (Left: Knee)   Anesthesia type: Spinal   Pre-op  diagnosis: LEFT  KNEE OSTEOARTHRITIS   Location: [facility] ROOM 10 / [facility]   Surgeons: Yvone Rush, MD       DISCUSSION:82 y.o. former smoker with h/o GERD, HTN, a-fib, CAD s/p cath in 12/2019 showing nonobstructive CAD and medical management recommended, breast cancer s/p right mastectomy, left knee OA scheduled for above procedure 12/21/2023 with Dr. Rush Yvone.   Pt s/p cervical decompression/discectomy 10/28/23. Per notes, Difficult Airway- due to reduced neck mobility.   Recent echo showed preserved EF 65% with normal wall motion, grade 1 DD. NST 07/2023 was also low risk. However, due to persistent complaint of exertional dyspnea, cardiac catheterization was recommended for definitive evaluation. Cath 09/15/2023 showed nonobstructive disease which was relatively stable compared to prior cath in 2001. Continue medical management was recommended.   Pt last seen by cardiology 11/19/23. Per OV note, [name]'s perioperative risk of a major cardiac event is 0.9% according to the Revised Cardiac Risk Index (RCRI). Therefore, she is at low risk for perioperative complications. Her functional capacity is good at 5.93 METs according to the Duke Activity Status Index (DASI). Recommendations: According to ACC/AHA guidelines, no further cardiovascular testing needed. The patient may proceed to surgery at acceptable risk. Antiplatelet and/or Anticoagulation Recommendations: Xarelto  (Rivaroxaban ) can be held for 3 days prior to surgery. Please resume post op when felt to be safe.  Pt reports last dose of Eliquis  12/17/23 PM dose.  VS: BP (!) 190/77   Pulse (!) 55   Temp (!) 36.4 C (Oral)   Resp 20   Ht 5' 5.5 (1.664 m)   Wt 68 kg   SpO2 98%   BMI 24.58 kg/m   PROVIDERS: Suanne Pfeiffer, NP is PCP  Cardiologist - Dr. Jayson Sierras Electrophys.[place]WOOD Ole Holts MD LABS: Labs reviewed: Acceptable for surgery. (all labs ordered are listed, but only abnormal results are displayed)  Labs Reviewed  BASIC METABOLIC PANEL WITH GFR - Abnormal; Notable for the following components:      Result Value   Glucose, Bld 106 (*)    All other components within normal limits  CBC - Abnormal; Notable for the following components:   RBC 3.61 (*)    Hemoglobin 11.0 (*)    HCT 34.7 (*)    All other components within normal limits  SURGICAL PCR SCREEN     IMAGES:   EKG:   CV: LHC 08/2023:    Mid RCA lesion is 60% stenosed. -Mild progression of disease; Prox RCA lesion is 20% stenosed.   Mid LAD lesion is 20% stenosed.   Prox Cx lesion is 50% stenosed. LPAV lesion is 60% stenosed (stable upon improved flow)   LV end diastolic pressure is normal.   There is no aortic valve stenosis.   Dominance: Right  POST-OPERATIVE DIAGNOSIS:   Nonobstructive coronary disease-relatively stable compared to 2021 with exception of progression of disease in the mid to distal RCA from 20% to 50%, stable proximal LCx 50% stenosis with ostium of AV groove circumflex 60% (previously read as 80%). Normal LVEDP     RECOMMENDATIONS   In the absence of any other complications or medical issues, we expect the patient to be ready for discharge from a cath perspective on 09/15/2023.   Recommend Aspirin  81mg  daily for moderate CAD.   Lexiscan  07/2023:      Stress ECG  is negative for ischemia and arrhythmias   LV perfusion is normal. There is no evidence of ischemia. There is no evidence of infarction.   Left ventricular function is normal. Nuclear stress EF: 64%.   Findings are consistent with no ischemia and no infarction. The study is low risk.  Echo 06/02/23 1. Left ventricular ejection fraction, by estimation, is 60 to 65%. The  left ventricle has normal function. The left ventricle has no regional  wall motion abnormalities. There is  mild left ventricular hypertrophy.  Left ventricular diastolic parameters  are consistent with Grade I diastolic dysfunction (impaired relaxation).  Elevated left ventricular end-diastolic pressure. The average left  ventricular global longitudinal strain is -21.1 %. The global longitudinal  strain is normal.   2. Right ventricular systolic function is normal. The right ventricular  size is normal.   3. The mitral valve is normal in structure. Trivial mitral valve  regurgitation. No evidence of mitral stenosis.   4. The aortic valve is tricuspid. Aortic valve regurgitation is not  visualized. No aortic stenosis is present.   5. The inferior vena cava is normal in size with greater than 50%  respiratory variability, suggesting right atrial pressure of 3 mmHg.   6. Cannot exclude a small PFO.  Past Medical History:  Diagnosis Date   Anemia    Arthritis    Breast cancer (HCC)    Right mastectomy   Chronic back pain    Coronary atherosclerosis of native coronary artery    a. 12/2019 Cath: LM nl, LAD 71m, LCX 50p, 80d, RCA 20p, 54m-->med rx.   Demand ischemia (HCC)    a. 02/2021 HsTrop to 361 in setting of rapid afib; b. 02/2021 Echo: EF 65-70%, no rwma, mild LVH, RVSP 28.74mmHg. Mildly to mod dil LA. Triv MR. Ao sclerosis w/o stenosis.   Dysrhythmia    A. Fib   Environmental allergies    Essential hypertension    GERD (gastroesophageal reflux disease)    History of bronchitis    History of colon polyps    History of diverticulosis    History of migraine    Panic attacks    Persistent atrial fibrillation (HCC) 02/2021   a. CHA2DS2VASc = 5-->xarelto .   Prolapse of vaginal vault after hysterectomy    Seasonal allergies    Urinary frequency     Past Surgical History:  Procedure Laterality Date   ABDOMINAL HYSTERECTOMY     ANTERIOR CERVICAL DECOMP/DISCECTOMY FUSION N/A 10/28/2023   Procedure: ANTERIOR CERVICAL DECOMPRESSION/DISCECTOMY FUSION 2 LEVELS;  Surgeon: Beuford Anes, MD;   Location: [facility];  Service: Orthopedics;  Laterality: N/A;  ANTERIOR CERVICAL DECOMPRESSION AND FUSION CERVICAL 6- CERVICAL 7, CERVICAL 7- THORACIC 1 WITH INSTRUMENTATION AND ALLOGRAFT   ATRIAL FIBRILLATION ABLATION N/A 10/28/2021   Procedure: ATRIAL FIBRILLATION ABLATION;  Surgeon: Cindie Ole DASEN, MD;  Location: [facility];  Service: Cardiovascular;  Laterality: N/A;   BACK SURGERY  1981/2011/2014   BLADDER SUSPENSION     BREAST RECONSTRUCTION  2004   with abd tissue   BREAST SURGERY Right    CARDIOVERSION N/A 04/30/2021   Procedure: CARDIOVERSION;  Surgeon: Alvan Dorn FALCON, MD;  Location: [facility];  Service: Endoscopy;  Laterality: N/A;   CARDIOVERSION N/A 05/16/2021   Procedure: CARDIOVERSION;  Surgeon: Alvan Dorn FALCON, MD;  Location: [facility];  Service: Endoscopy;  Laterality: N/A;   CARDIOVERSION N/A 08/13/2021   Procedure: CARDIOVERSION;  Surgeon: Kate Lonni CROME, MD;  Location: [facility] ENDOSCOPY;  Service: Cardiovascular;  Laterality: N/A;   CHOLECYSTECTOMY     COLONOSCOPY  12/2009   RMR: few pancolonic diverticula. next TCS 12/2014   COLONOSCOPY  06/14/2007   MFM:Ipfpwlupcz rectal polyp, status post cold biopsy removal/ Anal canal hemorrhoids/Left-sided diverticula Colonic mucosa appeared normal.Hyperplastic polyp.    COLONOSCOPY  01/18/2004   MFM:Dphfnpi diverticula/Internal hemorrhoids.  Otherwise normal rectum   COLONOSCOPY N/A 04/18/2013   Dr. Shaaron- normal rectum, scattered left sided diverticula the remainder of the colonic mucosa appeared normal   COLONOSCOPY N/A 08/18/2014   Procedure: COLONOSCOPY;  Surgeon: Lamar CHRISTELLA Shaaron, MD;  Location[facility];  Service: Endoscopy;  Laterality: N/A;  1000   ESOPHAGOGASTRODUODENOSCOPY  06/14/2007   MFM:Mpaanw-opxz plaques in esophageal mucosa of uncertain significance brushed and biopsied/Normal stomach, normal first duodenum and second duodenoscopy. KOH negative. esophageal bx c/w GERD   ESOPHAGOGASTRODUODENOSCOPY N/A  08/18/2014   Procedure: ESOPHAGOGASTRODUODENOSCOPY (EGD);  Surgeon: Lamar CHRISTELLA Shaaron, MD;  [facility];  Service: Endoscopy;  Laterality: N/A;   ESOPHAGOGASTRODUODENOSCOPY (EGD) WITH ESOPHAGEAL DILATION N/A 04/18/2013   Dr. Shaaron- normal, patent appearing, tubular esophagus, normal gastric mucosa, paptent pylorus, normal first and second portion of the duodenum   EYE SURGERY     lasik   FOOT SURGERY Right 2014   d/t plantar fascitis   GIVENS CAPSULE STUDY N/A 07/18/2013   L.Lewis PAC- markedly abnormal appearing gastric mucosa, sugnificant bile reflux, questionable subucosal small bowel mass versus extrinsic compression in the distal small bowel.- CTE= no small bowel mass or tumor seen.   LEFT HEART CATH AND CORONARY ANGIOGRAPHY N/A 01/16/2020   Procedure: LEFT HEART CATH AND CORONARY ANGIOGRAPHY;  Surgeon: Verlin Lonni BIRCH, MD;  [facility];  Service: Cardiovascular;  Laterality: N/A;   LEFT HEART CATH AND CORONARY ANGIOGRAPHY N/A 09/15/2023   Procedure: LEFT HEART CATH AND CORONARY ANGIOGRAPHY;  Surgeon: Anner Alm ORN, MD;  Location: [facility] INVASIVE CV LAB;  Service: Cardiovascular;  Laterality: N/A;   MALONEY DILATION N/A 08/18/2014   Procedure: AGAPITO DILATION;  Surgeon: Lamar CHRISTELLA Shaaron, MD;  Location[facility];  Service: Endoscopy;  Laterality: N/A;   MASTECTOMY     right side 12-13 years ago   MOUTH SURGERY     cyst removed in June 2022.   TOTAL HIP ARTHROPLASTY Left 02/29/2016   Procedure: TOTAL HIP ARTHROPLASTY ANTERIOR APPROACH;  Surgeon: Dempsey Sensor, MD;  Location: [facility];  Service: Orthopedics;  Laterality: Left;   TOTAL HIP ARTHROPLASTY Right 01/05/2017   Procedure: TOTAL HIP ARTHROPLASTY ANTERIOR APPROACH;  Surgeon: Sensor Dempsey, MD;  Location: [facility];  Service: Orthopedics;  Laterality: Right;  REQUEST 90 MINS    MEDICATIONS:  albuterol  (VENTOLIN  HFA) 108 (90 Base) MCG/ACT inhaler   Blood Pressure Monitor DEVI   budesonide-formoterol  (SYMBICORT) 160-4.5 MCG/ACT inhaler   diphenhydrAMINE  (BENADRYL ) 25 MG tablet   docusate sodium  (COLACE) 100 MG capsule   gabapentin  (NEURONTIN ) 300 MG capsule   HYDROcodone -acetaminophen  (NORCO) 7.5-325 MG tablet   HYDROcodone -acetaminophen  (NORCO/VICODIN) 5-325 MG tablet   losartan  (COZAAR ) 50 MG tablet   metoprolol  succinate (TOPROL  XL) 25 MG 24 hr tablet   metoprolol  succinate (TOPROL -XL) 50 MG 24 hr tablet   nitroGLYCERIN  (NITROSTAT ) 0.4 MG SL tablet   omeprazole  (PRILOSEC) 20 MG capsule   Rivaroxaban  (XARELTO ) 15 MG TABS tablet   rosuvastatin  (CRESTOR ) 5 MG tablet   No current facility-administered medications for this encounter.    Harlene Hoots Ward, PA-C [facility]Surgical [facility] [phone]

## 2023-12-16 NOTE — Progress Notes (Signed)
  Medicare AWV  [name] is a 83 y.o. female who presents for her subsequent annual wellness visit for Medicare.  Clinical documentation was reviewed and is accessible via encounter-level attachments.    Any physical exam components or additional concerns beyond the scope of the Annual Wellness Visit may be documented in a separate note within this encounter.  Having upcoming total L knee arthroplasty with [facility] in [place], schedule TBD pending this pre-op  eval. Chronic afib on xarelto  - will defer to CPP referral to safely ensure transition off temporarily for safe surgical procedure. EKG afib with rate control  Medicare Required Components     Reviewed and updated this visit by provider: Tobacco  Allergies  Meds  Problems  Med Hx  Surg Hx  Fam Hx  PDMP       Medicare required assessment: Future risk of substance use disorder / overdose risk of 79% is higher (>=20%).    Patient Care Team: Charmaine Heller, NP as PCP - General (Nurse Practitioner)  Vitals   Vitals:   12/03/23 0856  BP: 120/62  Pulse: 82  Temp: 97.8 F (36.6 C)  TempSrc: Temporal  Resp: 20  Height: 5' 5 (1.651 m)  Weight: 151 lb 3.2 oz (68.6 kg)  SpO2: 98%  BMI (Calculated): 25.2  PainSc:   9  PainLoc: Knee    Disposition   1. Chronic obstructive pulmonary disease, unspecified COPD type (*) (Primary) 2. Preop examination -     POCT ACR URINE -     Anemia Profile B; Future -     Comprehensive Metabolic Panel; Future -     POCT A1C -     ECG 12 lead -     Ambulatory Referral to CPP Medication Management 3. Mixed hyperlipidemia -     Lipid Panel With LDL/HDL Ratio; Future 4. Hypothyroidism due to medication -     TSH Reflex T4Free/T3 Hormone; Future 5. Abnormal serum enzyme level, unspecified -     Anemia Profile B; Future 6. Encounter for screening for diabetes mellitus -     POCT A1C 7. Anticoagulated -     Ambulatory Referral to CPP Medication Management 8.  Localized swelling of toe of left foot -     gabapentin  (NEURONTIN ) 300 mg capsule; Take one capsule (300 mg dose) by mouth at bedtime., Starting Thu 12/03/2023, Normal 9. Primary hypertension -     losartan  potassium (COZAAR ) 50 mg tablet; Take one tablet (50 mg dose) by mouth daily., Starting Thu 12/03/2023, Normal   Follow up in about 1 year (around 12/05/2024) for Medicare annual wellness visit.   Health maintenance issues were discussed with the patient.  A written plan was provided to the patient in the form of patient instructions in the After Visit Summary document.

## 2023-12-17 NOTE — Anesthesia Preprocedure Evaluation (Addendum)
 Anesthesia Evaluation  Patient identified by MRN, date of birth, ID band Patient awake    Reviewed: Allergy & Precautions, NPO status , Patient's Chart, lab work & pertinent test results, reviewed documented beta blocker date and time   History of Anesthesia Complications Negative for: history of anesthetic complications (easy VideoGlide intubation)  Airway Mallampati: I  TM Distance: >3 FB Neck ROM: Full    Dental  (+) Edentulous Upper, Edentulous Lower, Lower Dentures, Upper Dentures, Dental Advisory Given   Pulmonary COPD,  COPD inhaler, former smoker   breath sounds clear to auscultation       Cardiovascular hypertension, Pt. on home beta blockers and Pt. on medications + CAD (non-obstructive)  + dysrhythmias Atrial Fibrillation  Rhythm:Irregular Rate:Normal  07/2023 Stress   Stress ECG is negative for ischemia and arrhythmias   LV perfusion is normal. There is no evidence of ischemia. There is no evidence of infarction.   Left ventricular function is normal. Nuclear stress EF: 64%.  '25 ECHO: EF 60 to 65%.  1. The LV has normal function, no regional wall motion abnormalities. There is mild LVH. Grade I diastolic dysfunction (impaired relaxation).  Elevated left ventricular end-diastolic pressure. The average left ventricular global longitudinal strain is -21.1 %. The global longitudinal  strain is normal.   2. RVF is normal. The right ventricular size is normal.   3. The mitral valve is normal in structure. Trivial mitral valve regurgitation. No evidence of mitral stenosis.   4. The aortic valve is tricuspid. Aortic valve regurgitation is not visualized. No aortic stenosis is present.     Neuro/Psych   Anxiety        GI/Hepatic Neg liver ROS,GERD  Medicated,,  Endo/Other  Hypothyroidism    Renal/GU Renal InsufficiencyRenal disease     Musculoskeletal  (+) Arthritis ,    Abdominal   Peds  Hematology Hb 11.0,  plt 397k Xarelto : last dose Thursday 12/17/2023   Anesthesia Other Findings Breast cancer  Reproductive/Obstetrics                              Anesthesia Physical Anesthesia Plan  ASA: 3  Anesthesia Plan: Spinal   Post-op Pain Management: Regional block* and Tylenol  PO (pre-op )*   Induction:   PONV Risk Score and Plan: 2 and Treatment may vary due to age or medical condition  Airway Management Planned: Natural Airway and Simple Face Mask  Additional Equipment: None  Intra-op Plan:   Post-operative Plan:   Informed Consent: I have reviewed the patients History and Physical, chart, labs and discussed the procedure including the risks, benefits and alternatives for the proposed anesthesia with the patient or authorized representative who has indicated his/her understanding and acceptance.     Dental advisory given  Plan Discussed with: CRNA and Surgeon  Anesthesia Plan Comments: (See PAT note 12/15/23 Plan routine monitors, SAB with adductor canal block for post op analgesia)         Anesthesia Quick Evaluation

## 2023-12-17 NOTE — Progress Notes (Signed)
  [name] perioperative risk of a major cardiac event is 0.9% according to the Revised Cardiac Risk Index (RCRI).  Therefore, she is at low risk for perioperative complications.   Her functional capacity is good at 5.93 METs according to the Duke Activity Status Index (DASI). Recommendations: According to ACC/AHA guidelines, no further cardiovascular testing needed.  The patient may proceed to surgery at acceptable risk.   Antiplatelet and/or Anticoagulation Recommendations: Xarelto  (Rivaroxaban ) can be held for  3 days prior to surgery.  Please resume post op when felt to be safe.    Called and s/w patient who admits to improved shortness of breath, denies any recent shortness of breath. Admits to occasional, intermittent right sided chest pain, thinks this is due to the way she is sitting. Not bothersome per her report and doesn't happen often. I discussed with her that this does not sound cardiac related. Went over and reviewed her most recent results from her heart cath from earlier this year, as well as her most recent Echo - reports were reassuring. Also recommended extra strength Tylenol  PRN for her symptoms. Care and ED precautions have been discussed with pt and she has verbalized understanding.   Will forward this note to requesting party.    Melinda Crate, NP

## 2023-12-18 ENCOUNTER — Encounter (HOSPITAL_COMMUNITY): Payer: Self-pay | Admitting: Orthopedic Surgery

## 2023-12-20 DIAGNOSIS — M1712 Unilateral primary osteoarthritis, left knee: Principal | ICD-10-CM | POA: Diagnosis present

## 2023-12-20 NOTE — H&P (Signed)
 TOTAL KNEE ADMISSION H&P  Patient is being admitted for left total knee arthroplasty.  Subjective:  Chief Complaint:left knee pain.  HPI: [name], 83 y.o. female, has a history of pain and functional disability in the left knee due to arthritis and has failed non-surgical conservative treatments for greater than 12 weeks to includecorticosteriod injections, flexibility and strengthening excercises, use of assistive devices, weight reduction as appropriate, and activity modification.  Onset of symptoms was gradual, starting 2 years ago with rapidlly worsening course since that time. The patient noted no past surgery on the left knee(s).  Patient currently rates pain in the left knee(s) at 9 out of 10 with activity. Patient has night pain, worsening of pain with activity and weight bearing, pain that interferes with activities of daily living, crepitus, and joint swelling.  Patient has evidence of subchondral sclerosis, periarticular osteophytes, and joint space narrowing by imaging studies.. There is no active infection.  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   Vitamin D insufficiency 10/13/2022   Neuropathy 10/13/2022   Chronic kidney disease, stage 3a (HCC) 10/13/2022   Benign hypertensive kidney disease with chronic kidney disease 10/13/2022   Arthralgia of both knees 07/21/2022   Hypertensive emergency 10/31/2021   Hypothyroidism 10/31/2021   NSTEMI (non-ST elevated myocardial infarction) (HCC) 10/30/2021   Persistent atrial fibrillation (HCC) 07/30/2021   Secondary hypercoagulable state (HCC) 07/30/2021   Chest pain    Demand ischemia (HCC)    Polyarthralgia 03/27/2021   Rheumatoid arthritis (HCC) 03/27/2021   Atrial fibrillation  with rapid ventricular response (HCC) 03/27/2021   Chronic pain syndrome 03/08/2021   Empyema lung (HCC) 03/08/2021   Seropositive rheumatoid arthritis (HCC) 01/27/2021   Primary osteoarthritis of both hands 01/27/2021   Chondrocalcinosis 01/27/2021   Status post bilateral total hip replacement 01/27/2021   Primary osteoarthritis of both feet 01/27/2021   Hyperlipidemia 09/05/2020   Irregular heart beat 07/25/2020   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   Gastroesophageal reflux disease without esophagitis 07/25/2020   Mucosal abnormality of stomach 07/24/2020   Body mass index (BMI) 26.0-26.9, adult 03/12/2020   Essential hypertension 03/12/2020   Cervical myelopathy (HCC) 03/12/2020   Cervical spondylosis 03/12/2020   Abnormal stress test    Dyspnea on exertion    Pain in left knee 10/27/2017   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/05/2017   Osteoarthritis of right hip 01/02/2017   Primary localized osteoarthritis of left hip 02/29/2016   Primary osteoarthritis of left hip 02/22/2016   Unilateral primary osteoarthritis, left hip 02/22/2016   Fitting and adjustment of pessary 05/29/2015   Rectal pain, chronic 05/18/2015   History of diverticulosis 05/04/2015   Pelvic pain in female 02/20/2015   Cystocele 02/20/2015   Prolapse of vaginal vault after hysterectomy 02/20/2015   Hematuria 02/20/2015   Abnormal findings on diagnostic imaging of liver and biliary tract 12/06/2014   Abnormal findings on imaging of biliary tract 12/06/2014   Dysphagia, pharyngoesophageal phase    Diverticulosis of colon without hemorrhage    Rectal bleeding 07/25/2014   Left sided abdominal pain 03/01/2014   Heme positive stool 03/01/2014   Esophageal dysphagia 04/04/2013   Anemia 04/04/2013   Abnormal LFTs 04/04/2013   Change in bowel habits 04/04/2013   Constipation 04/04/2013  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 01/16/2013   Degeneration of intervertebral disc, site unspecified 12/15/2012    Shortness of breath 01/14/2012   Hypertensive heart disease 01/14/2012   Coronary atherosclerosis of native coronary artery 12/14/2009   ASTHMA 12/14/2009   GERD 12/14/2009   BREAST CANCER, HX OF 12/14/2009   Asthma 12/14/2009   CAD (coronary artery disease), native coronary artery 12/14/2009   Past Medical History:  Diagnosis Date   Anemia    Arthritis    Breast cancer (HCC)    Right mastectomy   Chronic back pain    Coronary atherosclerosis of native coronary artery    a. 12/2019 Cath: LM nl, LAD 32m, LCX 50p, 80d, RCA 20p, 60m-->med rx.   Demand ischemia (HCC)    a. 02/2021 HsTrop to 361 in setting of rapid afib; b. 02/2021 Echo: EF 65-70%, no rwma, mild LVH, RVSP 28.28mmHg. Mildly to mod dil LA. Triv MR. Ao sclerosis w/o stenosis.   Dysrhythmia    A. Fib   Environmental allergies    Essential hypertension    GERD (gastroesophageal reflux disease)    History of bronchitis    History of colon polyps    History of diverticulosis    History of migraine    Panic attacks    Persistent atrial fibrillation (HCC) 02/2021   a. CHA2DS2VASc = 5-->xarelto .   Prolapse of vaginal vault after hysterectomy    Seasonal allergies    Urinary frequency     Past Surgical History:  Procedure Laterality Date   ABDOMINAL HYSTERECTOMY     ANTERIOR CERVICAL DECOMP/DISCECTOMY FUSION N/A 10/28/2023   Procedure: ANTERIOR CERVICAL DECOMPRESSION/DISCECTOMY FUSION 2 LEVELS;  Surgeon: Beuford Anes, MD;  Location: [facility];  Service: Orthopedics;  Laterality: N/A;  ANTERIOR CERVICAL DECOMPRESSION AND FUSION CERVICAL 6- CERVICAL 7, CERVICAL 7- THORACIC 1 WITH INSTRUMENTATION AND ALLOGRAFT   ATRIAL FIBRILLATION ABLATION N/A 10/28/2021   Procedure: ATRIAL FIBRILLATION ABLATION;  Surgeon: Cindie Ole DASEN, MD;  Location: [facility];  Service: Cardiovascular;  Laterality: N/A;   BACK SURGERY  1981/2011/2014   BLADDER SUSPENSION     BREAST RECONSTRUCTION  2004   with abd tissue   BREAST SURGERY Right     CARDIOVERSION N/A 04/30/2021   Procedure: CARDIOVERSION;  Surgeon: Alvan Dorn FALCON, MD;  Location: [facility];  Service: Endoscopy;  Laterality: N/A;   CARDIOVERSION N/A 05/16/2021   Procedure: CARDIOVERSION;  Surgeon: Alvan Dorn FALCON, MD;  Location: [facility];  Service: Endoscopy;  Laterality: N/A;   CARDIOVERSION N/A 08/13/2021   Procedure: CARDIOVERSION;  Surgeon: Kate Lonni CROME, MD;  Location: [facility] ENDOSCOPY;  Service: Cardiovascular;  Laterality: N/A;   CHOLECYSTECTOMY     COLONOSCOPY  12/2009   RMR: few pancolonic diverticula. next TCS 12/2014   COLONOSCOPY  06/14/2007   MFM:[id number] rectal polyp, status post cold biopsy removal/ Anal canal hemorrhoids/Left-sided diverticula Colonic mucosa appeared normal.Hyperplastic polyp.    COLONOSCOPY  01/18/2004   MFM:[id number] diverticula/Internal hemorrhoids.  Otherwise normal rectum   COLONOSCOPY N/A 04/18/2013   Dr. Shaaron- normal rectum, scattered left sided diverticula the remainder of the colonic mucosa appeared normal   COLONOSCOPY N/A 08/18/2014   Procedure: COLONOSCOPY;  Surgeon: Lamar CHRISTELLA Shaaron, MD;  Location: [facility];  Service: Endoscopy;  Laterality: N/A;  1000   ESOPHAGOGASTRODUODENOSCOPY  06/14/2007   MFM:[id number]-opxz plaques in esophageal mucosa of uncertain significance brushed and biopsied/Normal stomach, normal first duodenum and second duodenoscopy. KOH negative. esophageal bx c/w GERD   ESOPHAGOGASTRODUODENOSCOPY N/A 08/18/2014  Procedure: ESOPHAGOGASTRODUODENOSCOPY (EGD);  Surgeon: Lamar CHRISTELLA Hollingshead, MD;  Location: [facility];  Service: Endoscopy;  Laterality: N/A;   ESOPHAGOGASTRODUODENOSCOPY (EGD) WITH ESOPHAGEAL DILATION N/A 04/18/2013   Dr. Hollingshead- normal, patent appearing, tubular esophagus, normal gastric mucosa, paptent pylorus, normal first and second portion of the duodenum   EYE SURGERY     lasik   FOOT SURGERY Right 2014   d/t plantar fascitis   GIVENS CAPSULE STUDY N/A 07/18/2013   L.Lewis PAC-  markedly abnormal appearing gastric mucosa, sugnificant bile reflux, questionable subucosal small bowel mass versus extrinsic compression in the distal small bowel.- CTE= no small bowel mass or tumor seen.   LEFT HEART CATH AND CORONARY ANGIOGRAPHY N/A 01/16/2020   Procedure: LEFT HEART CATH AND CORONARY ANGIOGRAPHY;  Surgeon: Verlin Lonni BIRCH, MD;  [facility]: [facility];  Service: Cardiovascular;  Laterality: N/A;   LEFT HEART CATH AND CORONARY ANGIOGRAPHY N/A 09/15/2023   Procedure: LEFT HEART CATH AND CORONARY ANGIOGRAPHY;  Surgeon: Anner Alm ORN, MD;  Location: [facility] INVASIVE CV LAB;  Service: Cardiovascular;  Laterality: N/A;   MALONEY DILATION N/A 08/18/2014   Procedure: AGAPITO DILATION;  Surgeon: Lamar CHRISTELLA Hollingshead, MD;  Location: [facility];  Service: Endoscopy;  Laterality: N/A;   MASTECTOMY     right side 12-13 years ago   MOUTH SURGERY     cyst removed in June 2022.   TOTAL HIP ARTHROPLASTY Left 02/29/2016   Procedure: TOTAL HIP ARTHROPLASTY ANTERIOR APPROACH;  Surgeon: Dempsey Sensor, MD;  Location: [facility] OR;  Service: Orthopedics;  Laterality: Left;   TOTAL HIP ARTHROPLASTY Right 01/05/2017   Procedure: TOTAL HIP ARTHROPLASTY ANTERIOR APPROACH;  Surgeon: Sensor Dempsey, MD;  Location: [facility] OR;  Service: Orthopedics;  Laterality: Right;  REQUEST 90 MINS    No current facility-administered medications for this encounter.   Current Outpatient Medications  Medication Sig Dispense Refill Last Dose/Taking   albuterol  (VENTOLIN  HFA) 108 (90 Base) MCG/ACT inhaler Inhale 1-2 puffs into the lungs every 6 (six) hours as needed for shortness of breath or wheezing.   Taking As Needed   budesonide-formoterol (SYMBICORT) 160-4.5 MCG/ACT inhaler Inhale 2 puffs into the lungs daily as needed (respiratory issues.).   Taking As Needed   diphenhydrAMINE  (BENADRYL ) 25 MG tablet Take 25 mg by mouth daily as needed for allergies.   Taking As Needed   docusate sodium  (COLACE) 100 MG capsule Take 100  mg by mouth daily as needed for mild constipation.   Taking As Needed   gabapentin  (NEURONTIN ) 300 MG capsule Take 300 mg by mouth daily.   Taking   HYDROcodone -acetaminophen  (NORCO) 7.5-325 MG tablet Take 1 tablet by mouth 4 (four) times daily as needed for moderate pain (pain score 4-6).   Taking As Needed   losartan  (COZAAR ) 50 MG tablet Take 1 tablet (50 mg total) by mouth daily. 90 tablet 3 Taking   metoprolol  succinate (TOPROL  XL) 25 MG 24 hr tablet Take 1 tablet (25 mg total) by mouth daily. Take in addition to 50 mg in the AM (Patient taking differently: Take 25 mg by mouth at bedtime. Take in addition to 50 mg in the AM) 90 tablet 3 Taking Differently   metoprolol  succinate (TOPROL -XL) 50 MG 24 hr tablet Take 1 tablet (50 mg total) by mouth in the morning and at bedtime. Take with or immediately following a meal. 180 tablet 1 Taking   nitroGLYCERIN  (NITROSTAT ) 0.4 MG SL tablet Place 1 tablet (0.4 mg total) under the tongue every 5 (  five) minutes as needed for chest pain. 25 tablet 3 Taking As Needed   omeprazole  (PRILOSEC) 20 MG capsule Take 20 mg by mouth daily as needed (indigestion/heartburn).   Taking As Needed   Rivaroxaban  (XARELTO ) 15 MG TABS tablet Take 1 tablet (15 mg total) by mouth daily with supper. 90 tablet 1 Taking   rosuvastatin  (CRESTOR ) 5 MG tablet Take 1 tablet (5 mg total) by mouth at bedtime. 90 tablet 3 Taking   Blood Pressure Monitor DEVI 1 each by Does not apply route daily. 1 each 0    HYDROcodone -acetaminophen  (NORCO/VICODIN) 5-325 MG tablet Take 1 tablet by mouth 4 (four) times daily as needed. (Patient not taking: Reported on 12/10/2023)   Not Taking   Allergies  Allergen Reactions   Penicillins Anaphylaxis, Swelling and Other (See Comments)    Blisters   Eliquis  [Apixaban ] Hives    Social History   Tobacco Use   Smoking status: Former    Current packs/day: 0.00    Average packs/day: 1.5 packs/day for 30.0 years (45.0 ttl pk-yrs)    Types: Cigarettes     Start date: 04/28/1970    Quit date: 04/28/2000    Years since quitting: 23.6   Smokeless tobacco: Never   Tobacco comments:    Former smoker 07/30/21  Substance Use Topics   Alcohol use: No    Family History  Problem Relation Age of Onset   Coronary artery disease Mother    Coronary artery disease Father    Cancer Sister        lung cancer, two sisters   Lung cancer Sister    Coronary artery disease Brother    Colon cancer Brother        less than age 52   Cancer Brother    Emphysema Brother    Diabetes Son    Healthy Son    Healthy Son    Healthy Son    Healthy Son      Review of Systems neg as relates to hpi  Objective:  Physical Exam Well-developed well-nourished patient in no acute distress. Alert and oriented x3 HEENT:within normal limits Cardiac: Regular rate and rhythm Pulmonary: Lungs clear to auscultation Abdomen: Soft and nontender.  Normal active bowel sounds  Musculoskeletal: Left knee exam:  Positive effision. Tender to palpation. No instability. Pain with ROM  Vital signs in last 24 hours:    Labs: Recent Results (from the past 2160 hours)  Basic metabolic panel per protocol     Status: Abnormal   Collection Time: 10/20/23  9:35 AM  Result Value Ref Range   Sodium 140 135 - 145 mmol/L   Potassium 4.6 3.5 - 5.1 mmol/L   Chloride 103 98 - 111 mmol/L   CO2 27 22 - 32 mmol/L   Glucose, Bld 120 (H) 70 - 99 mg/dL    Comment: Glucose reference range applies only to samples taken after fasting for at least 8 hours.   BUN 17 8 - 23 mg/dL   Creatinine, Ser 8.92 (H) 0.44 - 1.00 mg/dL   Calcium  9.9 8.9 - 10.3 mg/dL   GFR, Estimated 52 (L) >60 mL/min    Comment: (NOTE) Calculated using the CKD-EPI Creatinine Equation (2021)    Anion gap 10 5 - 15    Comment: Performed at [facility], [address], [place], [place] [address]  CBC per protocol     Status: Abnormal   Collection Time: 10/20/23  9:35 AM  Result Value Ref Range  WBC 8.5 4.0 - 10.5  K/uL   RBC 3.50 (L) 3.87 - 5.11 MIL/uL   Hemoglobin 10.9 (L) 12.0 - 15.0 g/dL   HCT 66.5 (L) 63.9 - 53.9 %   MCV 95.4 80.0 - 100.0 fL   MCH 31.1 26.0 - 34.0 pg   MCHC 32.6 30.0 - 36.0 g/dL   RDW 86.5 88.4 - 84.4 %   Platelets 276 150 - 400 K/uL   nRBC 0.0 0.0 - 0.2 %    Comment: Performed at [facility], [address] [place], [place] [address]  Surgical pcr screen     Status: None   Collection Time: 10/20/23 10:08 AM   Specimen: Nasal Mucosa; Nasal Swab  Result Value Ref Range   MRSA, PCR NEGATIVE NEGATIVE   Staphylococcus aureus NEGATIVE NEGATIVE    Comment: (NOTE) The Xpert SA Assay (FDA approved for NASAL specimens in patients 79 years of age and older), is one component of a comprehensive surveillance program. It is not intended to diagnose infection nor to guide or monitor treatment. Performed at [facility], [address], [place], [place] [address]   Basic metabolic panel per protocol     Status: Abnormal   Collection Time: 12/15/23  9:37 AM  Result Value Ref Range   Sodium 139 135 - 145 mmol/L   Potassium 4.6 3.5 - 5.1 mmol/L   Chloride 104 98 - 111 mmol/L   CO2 25 22 - 32 mmol/L   Glucose, Bld 106 (H) 70 - 99 mg/dL    Comment: Glucose reference range applies only to samples taken after fasting for at least 8 hours.   BUN 18 8 - 23 mg/dL   Creatinine, Ser 9.09 0.44 - 1.00 mg/dL   Calcium  9.8 8.9 - 10.3 mg/dL   GFR, Estimated >39 >39 mL/min    Comment: (NOTE) Calculated using the CKD-EPI Creatinine Equation (2021)    Anion gap 10 5 - 15    Comment: Performed at [facility], [address] [place], [place] [address]  CBC per protocol     Status: Abnormal   Collection Time: 12/15/23  9:37 AM  Result Value Ref Range   WBC 9.1 4.0 - 10.5 K/uL   RBC 3.61 (L) 3.87 - 5.11 MIL/uL   Hemoglobin 11.0 (L) 12.0 - 15.0 g/dL   HCT 65.2 (L) 63.9 - 53.9 %   MCV 96.1 80.0 - 100.0 fL   MCH 30.5 26.0 - 34.0 pg   MCHC 31.7 30.0 - 36.0 g/dL    RDW 86.9 88.4 - 84.4 %   Platelets 397 150 - 400 K/uL   nRBC 0.0 0.0 - 0.2 %    Comment: Performed at [facility], [address] [place], [place] [address]  Surgical pcr screen     Status: None   Collection Time: 12/15/23  9:37 AM   Specimen: Nasal Mucosa; Nasal Swab  Result Value Ref Range   MRSA, PCR NEGATIVE NEGATIVE   Staphylococcus aureus NEGATIVE NEGATIVE    Comment: (NOTE) The Xpert SA Assay (FDA approved for NASAL specimens in patients 51 years of age and older), is one component of a comprehensive surveillance program. It is not intended to diagnose infection nor to guide or monitor treatment. Performed at [facility], [address], [place], [place] [address]      Estimated body mass index is 24.58 kg/m as calculated from the following:   Height as of 12/15/23: 5' 5.5 (  1.664 m).   Weight as of 12/15/23: 68 kg.   Imaging Review Plain radiographs demonstrate severe degenerative joint disease of the left knee(s). The overall alignment isneutral. The bone quality appears to be good for age and reported activity level.      Assessment/Plan:  End stage arthritis, left knee   The patient history, physical examination, clinical judgment of the provider and imaging studies are consistent with end stage degenerative joint disease of the left knee(s) and total knee arthroplasty is deemed medically necessary. The treatment options including medical management, injection therapy arthroscopy and arthroplasty were discussed at length. The risks and benefits of total knee arthroplasty were presented and reviewed. The risks due to aseptic loosening, infection, stiffness, patella tracking problems, thromboembolic complications and other imponderables were discussed. The patient acknowledged the explanation, agreed to proceed with the plan and consent was signed. Patient is being admitted for inpatient treatment for surgery, pain control, PT,  OT, prophylactic antibiotics, VTE prophylaxis, progressive ambulation and ADL's and discharge planning. The patient is planning to be discharged home with home health services     Patient's anticipated LOS is less than 2 midnights, meeting these requirements:  - Lives within 1 hour of care - Has a competent adult at home to recover with post-op recover - NO history of  - Chronic pain requiring opiods  - Diabetes  - Coronary Artery Disease  - Heart failure  - Heart attack  - Stroke  - DVT/VTE  - Cardiac arrhythmia  - Respiratory Failure/COPD  - Renal failure  - Anemia  - Advanced Liver disease

## 2023-12-21 ENCOUNTER — Ambulatory Visit (HOSPITAL_BASED_OUTPATIENT_CLINIC_OR_DEPARTMENT_OTHER): Payer: Self-pay | Admitting: Anesthesiology

## 2023-12-21 ENCOUNTER — Encounter (HOSPITAL_COMMUNITY): Payer: Self-pay | Admitting: Orthopedic Surgery

## 2023-12-21 ENCOUNTER — Other Ambulatory Visit: Payer: Self-pay

## 2023-12-21 ENCOUNTER — Observation Stay (HOSPITAL_COMMUNITY)
Admission: RE | Admit: 2023-12-21 | Discharge: 2023-12-22 | Disposition: A | Discharge status: Home / Self Care | Source: Home / Self Care | Attending: Orthopedic Surgery | Admitting: Orthopedic Surgery

## 2023-12-21 ENCOUNTER — Encounter (HOSPITAL_COMMUNITY)
Admission: RE | Disposition: A | Discharge status: Home / Self Care | Payer: Self-pay | Source: Home / Self Care | Attending: Orthopedic Surgery

## 2023-12-21 ENCOUNTER — Ambulatory Visit (HOSPITAL_COMMUNITY): Payer: Self-pay | Admitting: Physician Assistant

## 2023-12-21 DIAGNOSIS — Z87891 Personal history of nicotine dependence: Secondary | ICD-10-CM

## 2023-12-21 DIAGNOSIS — I2510 Atherosclerotic heart disease of native coronary artery without angina pectoris: Secondary | ICD-10-CM | POA: Insufficient documentation

## 2023-12-21 DIAGNOSIS — Z96643 Presence of artificial hip joint, bilateral: Secondary | ICD-10-CM | POA: Insufficient documentation

## 2023-12-21 DIAGNOSIS — J449 Chronic obstructive pulmonary disease, unspecified: Secondary | ICD-10-CM | POA: Insufficient documentation

## 2023-12-21 DIAGNOSIS — Z9889 Other specified postprocedural states: Secondary | ICD-10-CM

## 2023-12-21 DIAGNOSIS — E039 Hypothyroidism, unspecified: Secondary | ICD-10-CM | POA: Insufficient documentation

## 2023-12-21 DIAGNOSIS — M1712 Unilateral primary osteoarthritis, left knee: Secondary | ICD-10-CM

## 2023-12-21 DIAGNOSIS — Z79899 Other long term (current) drug therapy: Secondary | ICD-10-CM | POA: Insufficient documentation

## 2023-12-21 DIAGNOSIS — I10 Essential (primary) hypertension: Secondary | ICD-10-CM | POA: Insufficient documentation

## 2023-12-21 DIAGNOSIS — E669 Obesity, unspecified: Secondary | ICD-10-CM | POA: Insufficient documentation

## 2023-12-21 DIAGNOSIS — Z6824 Body mass index (BMI) 24.0-24.9, adult: Secondary | ICD-10-CM | POA: Insufficient documentation

## 2023-12-21 DIAGNOSIS — Z853 Personal history of malignant neoplasm of breast: Secondary | ICD-10-CM | POA: Insufficient documentation

## 2023-12-21 HISTORY — PX: TOTAL KNEE ARTHROPLASTY: SHX125

## 2023-12-21 SURGERY — ARTHROPLASTY, KNEE, TOTAL
Anesthesia: Spinal | Site: Knee | Laterality: Left

## 2023-12-21 MED ORDER — LIDOCAINE HCL (CARDIAC) PF 100 MG/5ML IV SOSY
PREFILLED_SYRINGE | INTRAVENOUS | Status: DC | PRN
  Administered 2023-12-21: 20 mg via INTRAVENOUS

## 2023-12-21 MED ORDER — OXYCODONE-ACETAMINOPHEN 5-325 MG PO TABS: 1.0000 | ORAL_TABLET | ORAL | Status: DC | PRN

## 2023-12-21 MED ORDER — HYDROMORPHONE HCL 1 MG/ML IJ SOLN
0.2500 mg | INTRAMUSCULAR | Status: DC | PRN
  Administered 2023-12-21 (×2): 0.5 mg via INTRAVENOUS
  Administered 2023-12-21: 0.25 mg via INTRAVENOUS
  Administered 2023-12-21: 0.5 mg via INTRAVENOUS
  Administered 2023-12-21: 0.25 mg via INTRAVENOUS

## 2023-12-21 MED ORDER — MIDAZOLAM HCL 2 MG/2ML IJ SOLN
INTRAMUSCULAR | Status: AC
  Filled 2023-12-21: qty 2

## 2023-12-21 MED ORDER — MEPERIDINE HCL 100 MG/ML IJ SOLN: 6.2500 mg | INTRAMUSCULAR | Status: DC | PRN

## 2023-12-21 MED ORDER — FENTANYL CITRATE PF 50 MCG/ML IJ SOSY
50.0000 ug | PREFILLED_SYRINGE | Freq: Once | INTRAMUSCULAR | Status: AC
  Administered 2023-12-21: 50 ug via INTRAVENOUS
  Filled 2023-12-21: qty 2

## 2023-12-21 MED ORDER — ROPIVACAINE HCL 7.5 MG/ML IJ SOLN
INTRAMUSCULAR | Status: DC | PRN
  Administered 2023-12-21: 20 mL via PERINEURAL

## 2023-12-21 MED ORDER — ONDANSETRON HCL 4 MG PO TABS: 4.0000 mg | ORAL_TABLET | Freq: Four times a day (QID) | ORAL | Status: DC | PRN

## 2023-12-21 MED ORDER — LACTATED RINGERS IV BOLUS
500.0000 mL | Freq: Once | INTRAVENOUS | Status: AC
  Administered 2023-12-21: 500 mL via INTRAVENOUS

## 2023-12-21 MED ORDER — TRANEXAMIC ACID-NACL 1000-0.7 MG/100ML-% IV SOLN
1000.0000 mg | Freq: Once | INTRAVENOUS | Status: AC
  Administered 2023-12-21: 1000 mg via INTRAVENOUS
  Filled 2023-12-21: qty 100

## 2023-12-21 MED ORDER — HYDROMORPHONE HCL 1 MG/ML IJ SOLN
INTRAMUSCULAR | Status: AC
  Filled 2023-12-21: qty 1

## 2023-12-21 MED ORDER — EPHEDRINE 5 MG/ML INJ
INTRAVENOUS | Status: AC
  Filled 2023-12-21: qty 5

## 2023-12-21 MED ORDER — LIDOCAINE HCL (PF) 2 % IJ SOLN
INTRAMUSCULAR | Status: AC
  Filled 2023-12-21: qty 10

## 2023-12-21 MED ORDER — ALBUTEROL SULFATE (2.5 MG/3ML) 0.083% IN NEBU: 2.5000 mg | INHALATION_SOLUTION | Freq: Four times a day (QID) | RESPIRATORY_TRACT | Status: DC | PRN

## 2023-12-21 MED ORDER — LACTATED RINGERS IV BOLUS: 250.0000 mL | Freq: Once | INTRAVENOUS | Status: DC

## 2023-12-21 MED ORDER — MIDAZOLAM HCL 2 MG/2ML IJ SOLN
1.0000 mg | Freq: Once | INTRAMUSCULAR | Status: DC
  Filled 2023-12-21: qty 2

## 2023-12-21 MED ORDER — BUPIVACAINE LIPOSOME 1.3 % IJ SUSP
INTRAMUSCULAR | Status: AC
  Filled 2023-12-21: qty 20

## 2023-12-21 MED ORDER — WATER FOR IRRIGATION, STERILE IR SOLN
Status: DC | PRN
  Administered 2023-12-21: 1000 mL

## 2023-12-21 MED ORDER — DEXAMETHASONE SODIUM PHOSPHATE 10 MG/ML IJ SOLN
10.0000 mg | Freq: Once | INTRAMUSCULAR | Status: AC
  Administered 2023-12-21: 10 mg via INTRAVENOUS
  Filled 2023-12-21: qty 1

## 2023-12-21 MED ORDER — LACTATED RINGERS IV SOLN: INTRAVENOUS | Status: DC

## 2023-12-21 MED ORDER — PROPOFOL 10 MG/ML IV BOLUS
INTRAVENOUS | Status: AC
  Filled 2023-12-21: qty 20

## 2023-12-21 MED ORDER — SODIUM CHLORIDE (PF) 0.9 % IJ SOLN
INTRAMUSCULAR | Status: AC
  Filled 2023-12-21: qty 50

## 2023-12-21 MED ORDER — EPHEDRINE SULFATE (PRESSORS) 50 MG/ML IJ SOLN
INTRAMUSCULAR | Status: DC | PRN
  Administered 2023-12-21 (×2): 5 mg via INTRAVENOUS
  Administered 2023-12-21 (×2): 7.5 mg via INTRAVENOUS

## 2023-12-21 MED ORDER — PHENYLEPHRINE HCL-NACL 20-0.9 MG/250ML-% IV SOLN
INTRAVENOUS | Status: DC | PRN
  Administered 2023-12-21 (×6): 80 ug via INTRAVENOUS

## 2023-12-21 MED ORDER — CEFAZOLIN SODIUM-DEXTROSE 2-4 GM/100ML-% IV SOLN
2.0000 g | Freq: Once | INTRAVENOUS | Status: AC
  Administered 2023-12-21: 2 g via INTRAVENOUS
  Filled 2023-12-21: qty 100

## 2023-12-21 MED ORDER — FLUTICASONE FUROATE-VILANTEROL 200-25 MCG/ACT IN AEPB
1.0000 | INHALATION_SPRAY | Freq: Every day | RESPIRATORY_TRACT | Status: DC
  Filled 2023-12-21: qty 28

## 2023-12-21 MED ORDER — PROPOFOL 1000 MG/100ML IV EMUL
INTRAVENOUS | Status: AC
  Filled 2023-12-21: qty 100

## 2023-12-21 MED ORDER — DEXAMETHASONE SODIUM PHOSPHATE 10 MG/ML IJ SOLN
INTRAMUSCULAR | Status: AC
  Filled 2023-12-21: qty 2

## 2023-12-21 MED ORDER — HYDROMORPHONE HCL 1 MG/ML IJ SOLN
0.2500 mg | INTRAMUSCULAR | Status: DC | PRN
  Administered 2023-12-21 (×2): 0.25 mg via INTRAVENOUS
  Administered 2023-12-21: 0.5 mg via INTRAVENOUS

## 2023-12-21 MED ORDER — ACETAMINOPHEN 500 MG PO TABS
1000.0000 mg | ORAL_TABLET | Freq: Once | ORAL | Status: AC
  Administered 2023-12-21: 1000 mg via ORAL
  Filled 2023-12-21: qty 2

## 2023-12-21 MED ORDER — METHOCARBAMOL 500 MG PO TABS
ORAL_TABLET | ORAL | Status: AC
  Filled 2023-12-21: qty 1

## 2023-12-21 MED ORDER — METOPROLOL SUCCINATE ER 50 MG PO TB24
50.0000 mg | ORAL_TABLET | Freq: Two times a day (BID) | ORAL | Status: DC
  Administered 2023-12-21 – 2023-12-22 (×2): 50 mg via ORAL
  Filled 2023-12-21 (×3): qty 1

## 2023-12-21 MED ORDER — GABAPENTIN 300 MG PO CAPS
300.0000 mg | ORAL_CAPSULE | Freq: Every day | ORAL | Status: DC
  Administered 2023-12-21 – 2023-12-22 (×2): 300 mg via ORAL
  Filled 2023-12-21 (×2): qty 1

## 2023-12-21 MED ORDER — BUPIVACAINE IN DEXTROSE 0.75-8.25 % IT SOLN
INTRATHECAL | Status: DC | PRN
  Administered 2023-12-21: 1.6 mL via INTRATHECAL

## 2023-12-21 MED ORDER — DIPHENHYDRAMINE HCL 12.5 MG/5ML PO ELIX: 12.5000 mg | ORAL_SOLUTION | ORAL | Status: DC | PRN

## 2023-12-21 MED ORDER — ONDANSETRON HCL 4 MG/2ML IJ SOLN
INTRAMUSCULAR | Status: AC
  Filled 2023-12-21: qty 4

## 2023-12-21 MED ORDER — ACETAMINOPHEN 500 MG PO TABS
1000.0000 mg | ORAL_TABLET | Freq: Four times a day (QID) | ORAL | Status: DC
  Administered 2023-12-21 – 2023-12-22 (×3): 1000 mg via ORAL
  Filled 2023-12-21 (×3): qty 2

## 2023-12-21 MED ORDER — HYDROMORPHONE HCL 1 MG/ML IJ SOLN
INTRAMUSCULAR | Status: AC
Start: 2023-12-21 — End: 2023-12-21
  Filled 2023-12-21: qty 2

## 2023-12-21 MED ORDER — PANTOPRAZOLE SODIUM 40 MG PO TBEC
40.0000 mg | DELAYED_RELEASE_TABLET | Freq: Every day | ORAL | Status: DC
  Administered 2023-12-21 – 2023-12-22 (×2): 40 mg via ORAL
  Filled 2023-12-21 (×2): qty 1

## 2023-12-21 MED ORDER — ACETAMINOPHEN 325 MG PO TABS: 325.0000 mg | ORAL_TABLET | Freq: Four times a day (QID) | ORAL | Status: DC | PRN

## 2023-12-21 MED ORDER — SODIUM CHLORIDE 0.9 % IV SOLN: INTRAVENOUS | Status: DC

## 2023-12-21 MED ORDER — METHOCARBAMOL 500 MG PO TABS
500.0000 mg | ORAL_TABLET | Freq: Four times a day (QID) | ORAL | Status: DC | PRN
  Administered 2023-12-21 – 2023-12-22 (×4): 500 mg via ORAL
  Filled 2023-12-21 (×3): qty 1

## 2023-12-21 MED ORDER — LOSARTAN POTASSIUM 50 MG PO TABS
50.0000 mg | ORAL_TABLET | Freq: Every day | ORAL | Status: DC
Start: 2023-12-21 — End: 2023-12-22
  Administered 2023-12-21 – 2023-12-22 (×2): 50 mg via ORAL
  Filled 2023-12-21 (×2): qty 1

## 2023-12-21 MED ORDER — OXYCODONE HCL 5 MG PO TABS
ORAL_TABLET | ORAL | Status: AC
  Filled 2023-12-21: qty 1

## 2023-12-21 MED ORDER — BUPIVACAINE LIPOSOME 1.3 % IJ SUSP: 20.0000 mL | Freq: Once | INTRAMUSCULAR | Status: DC

## 2023-12-21 MED ORDER — 0.9 % SODIUM CHLORIDE (POUR BTL) OPTIME
TOPICAL | Status: DC | PRN
  Administered 2023-12-21: 1000 mL

## 2023-12-21 MED ORDER — MIDAZOLAM HCL 2 MG/2ML IJ SOLN
0.5000 mg | Freq: Once | INTRAMUSCULAR | Status: AC | PRN
  Administered 2023-12-21: 1 mg via INTRAVENOUS

## 2023-12-21 MED ORDER — CEFAZOLIN SODIUM-DEXTROSE 2-4 GM/100ML-% IV SOLN
2.0000 g | INTRAVENOUS | Status: AC
  Administered 2023-12-21: 2 g via INTRAVENOUS
  Filled 2023-12-21: qty 100

## 2023-12-21 MED ORDER — TIZANIDINE HCL 2 MG PO TABS: 2.0000 mg | ORAL_TABLET | Freq: Three times a day (TID) | ORAL | Status: DC | PRN

## 2023-12-21 MED ORDER — TRANEXAMIC ACID-NACL 1000-0.7 MG/100ML-% IV SOLN
1000.0000 mg | INTRAVENOUS | Status: AC
  Administered 2023-12-21: 1000 mg via INTRAVENOUS
  Filled 2023-12-21: qty 100

## 2023-12-21 MED ORDER — ONDANSETRON HCL 4 MG/2ML IJ SOLN
INTRAMUSCULAR | Status: DC | PRN
  Administered 2023-12-21: 4 mg via INTRAVENOUS

## 2023-12-21 MED ORDER — DEXAMETHASONE SODIUM PHOSPHATE 10 MG/ML IJ SOLN
INTRAMUSCULAR | Status: DC | PRN
  Administered 2023-12-21: 8 mg via INTRAVENOUS

## 2023-12-21 MED ORDER — ONDANSETRON HCL 4 MG/2ML IJ SOLN: 4.0000 mg | Freq: Four times a day (QID) | INTRAMUSCULAR | Status: DC | PRN

## 2023-12-21 MED ORDER — OXYCODONE HCL 5 MG PO TABS
5.0000 mg | ORAL_TABLET | Freq: Once | ORAL | Status: AC | PRN
  Administered 2023-12-21: 5 mg via ORAL

## 2023-12-21 MED ORDER — SODIUM CHLORIDE 0.9 % IR SOLN
Status: DC | PRN
  Administered 2023-12-21: 1000 mL

## 2023-12-21 MED ORDER — PHENYLEPHRINE 80 MCG/ML (10ML) SYRINGE FOR IV PUSH (FOR BLOOD PRESSURE SUPPORT)
PREFILLED_SYRINGE | INTRAVENOUS | Status: AC
  Filled 2023-12-21: qty 10

## 2023-12-21 MED ORDER — HYDROMORPHONE HCL 1 MG/ML IJ SOLN
0.5000 mg | INTRAMUSCULAR | Status: DC | PRN
  Administered 2023-12-21: 1 mg via INTRAVENOUS
  Filled 2023-12-21: qty 1

## 2023-12-21 MED ORDER — CHLORHEXIDINE GLUCONATE 0.12 % MT SOLN
15.0000 mL | Freq: Once | OROMUCOSAL | Status: AC
  Administered 2023-12-21: 15 mL via OROMUCOSAL

## 2023-12-21 MED ORDER — ORAL CARE MOUTH RINSE: 15.0000 mL | Freq: Once | OROMUCOSAL | Status: AC

## 2023-12-21 MED ORDER — OXYCODONE HCL 5 MG/5ML PO SOLN: 5.0000 mg | Freq: Once | ORAL | Status: AC | PRN

## 2023-12-21 MED ORDER — SODIUM CHLORIDE (PF) 0.9 % IJ SOLN
INTRAMUSCULAR | Status: DC | PRN
  Administered 2023-12-21: 100 mL

## 2023-12-21 MED ORDER — PROPOFOL 500 MG/50ML IV EMUL
INTRAVENOUS | Status: DC | PRN
  Administered 2023-12-21: 75 ug/kg/min via INTRAVENOUS

## 2023-12-21 MED ORDER — DOCUSATE SODIUM 100 MG PO CAPS
100.0000 mg | ORAL_CAPSULE | Freq: Two times a day (BID) | ORAL | Status: DC
  Administered 2023-12-21 – 2023-12-22 (×3): 100 mg via ORAL
  Filled 2023-12-21 (×4): qty 1

## 2023-12-21 MED ORDER — BUPIVACAINE-EPINEPHRINE (PF) 0.5% -1:200000 IJ SOLN
INTRAMUSCULAR | Status: AC
  Filled 2023-12-21: qty 30

## 2023-12-21 MED ORDER — METHOCARBAMOL 1000 MG/10ML IJ SOLN: 500.0000 mg | Freq: Four times a day (QID) | INTRAMUSCULAR | Status: DC | PRN

## 2023-12-21 MED ORDER — NITROGLYCERIN 0.4 MG SL SUBL: 0.4000 mg | SUBLINGUAL_TABLET | SUBLINGUAL | Status: DC | PRN

## 2023-12-21 MED ORDER — PROPOFOL 10 MG/ML IV BOLUS
INTRAVENOUS | Status: DC | PRN
  Administered 2023-12-21: 20 mg via INTRAVENOUS

## 2023-12-21 MED ORDER — OXYCODONE HCL 5 MG PO TABS
5.0000 mg | ORAL_TABLET | ORAL | Status: DC | PRN
  Administered 2023-12-21 (×2): 5 mg via ORAL
  Administered 2023-12-22 (×2): 10 mg via ORAL
  Administered 2023-12-22: 5 mg via ORAL
  Filled 2023-12-21 (×2): qty 1
  Filled 2023-12-21: qty 2
  Filled 2023-12-21: qty 1
  Filled 2023-12-21: qty 2

## 2023-12-21 SURGICAL SUPPLY — 46 items
ATTUNE PSFEM LTSZ6 NARCEM KNEE (Femur) IMPLANT
ATTUNE PSRP INSR SZ6 5 KNEE (Insert) IMPLANT
BAG COUNTER SPONGE SURGICOUNT (BAG) IMPLANT
BAG ZIPLOCK 12X15 (MISCELLANEOUS) ×1 IMPLANT
BASE TIBIAL ROT PLAT SZ 5 KNEE (Knees) IMPLANT
BLADE SAGITTAL 25.0X1.19X90 (BLADE) ×1 IMPLANT
BLADE SAW SGTL 13.0X1.19X90.0M (BLADE) ×1 IMPLANT
BNDG ELASTIC 4X5.8 VLCR NS LF (GAUZE/BANDAGES/DRESSINGS) IMPLANT
BNDG ELASTIC 6INX 5YD STR LF (GAUZE/BANDAGES/DRESSINGS) ×1 IMPLANT
BOOTIES KNEE HIGH SLOAN (MISCELLANEOUS) ×1 IMPLANT
BOWL SMART MIX CTS (DISPOSABLE) ×1 IMPLANT
CEMENT HV SMART SET (Cement) ×2 IMPLANT
COVER SURGICAL LIGHT HANDLE (MISCELLANEOUS) ×1 IMPLANT
CUFF TRNQT CYL 34X4.125X (TOURNIQUET CUFF) ×1 IMPLANT
DERMABOND ADVANCED .7 DNX12 (GAUZE/BANDAGES/DRESSINGS) ×1 IMPLANT
DRAPE U-SHAPE 47X51 STRL (DRAPES) ×1 IMPLANT
DRESSING AQUACEL AG SP 3.5X10 (GAUZE/BANDAGES/DRESSINGS) IMPLANT
DRSG AQUACEL AG ADV 3.5X10 (GAUZE/BANDAGES/DRESSINGS) ×1 IMPLANT
DURAPREP 26ML APPLICATOR (WOUND CARE) ×1 IMPLANT
ELECT PENCIL ROCKER SW 15FT (MISCELLANEOUS) ×1 IMPLANT
ELECT REM PT RETURN 15FT ADLT (MISCELLANEOUS) ×1 IMPLANT
GLOVE BIOGEL PI IND STRL 8 (GLOVE) ×2 IMPLANT
GLOVE ECLIPSE 7.5 STRL STRAW (GLOVE) ×2 IMPLANT
GOWN STRL REUS W/ TWL XL LVL3 (GOWN DISPOSABLE) ×2 IMPLANT
HOOD PEEL AWAY T7 (MISCELLANEOUS) ×3 IMPLANT
KIT TURNOVER KIT A (KITS) ×1 IMPLANT
MANIFOLD NEPTUNE II (INSTRUMENTS) ×1 IMPLANT
NDL HYPO 22X1.5 SAFETY MO (MISCELLANEOUS) ×2 IMPLANT
NEEDLE HYPO 22X1.5 SAFETY MO (MISCELLANEOUS) ×2 IMPLANT
NS IRRIG 1000ML POUR BTL (IV SOLUTION) ×1 IMPLANT
PACK TOTAL KNEE CUSTOM (KITS) ×1 IMPLANT
PADDING CAST COTTON 6X4 STRL (CAST SUPPLIES) ×1 IMPLANT
PATELLA MEDIAL ATTUN 35MM KNEE (Knees) IMPLANT
PIN STEINMAN FIXATION KNEE (PIN) IMPLANT
PROTECTOR NERVE ULNAR (MISCELLANEOUS) ×1 IMPLANT
SET HNDPC FAN SPRY TIP SCT (DISPOSABLE) ×1 IMPLANT
SPIKE FLUID TRANSFER (MISCELLANEOUS) ×2 IMPLANT
SUT MNCRL AB 3-0 PS2 18 (SUTURE) ×1 IMPLANT
SUT VIC AB 0 CT1 36 (SUTURE) ×2 IMPLANT
SUT VIC AB 1 CT1 36 (SUTURE) ×2 IMPLANT
SUT VIC AB 2-0 CT1 TAPERPNT 27 (SUTURE) ×1 IMPLANT
SYR CONTROL 10ML LL (SYRINGE) ×2 IMPLANT
TRAY CATH INTERMITTENT SS 16FR (CATHETERS) IMPLANT
TUBE SUCTION HIGH CAP CLEAR NV (SUCTIONS) ×1 IMPLANT
WATER STERILE IRR 1000ML POUR (IV SOLUTION) ×2 IMPLANT
WRAP KNEE MAXI GEL POST OP (GAUZE/BANDAGES/DRESSINGS) ×1 IMPLANT

## 2023-12-21 NOTE — Discharge Instructions (Signed)

## 2023-12-21 NOTE — Transfer of Care (Signed)
 Immediate Anesthesia Transfer of Care Note  Patient: [name]  Procedure(s) Performed: Procedure(s): ARTHROPLASTY, KNEE, TOTAL (Left)  Patient Location: PACU  Anesthesia Type:Spinal  Level of Consciousness:  sedated, patient cooperative and responds to stimulation  Airway & Oxygen Therapy:Patient Spontanous Breathing and Patient connected to face mask oxgen  Post-op Assessment:  Report given to PACU RN and Post -op Vital signs reviewed and stable  Post vital signs:  Reviewed and stable  Last Vitals:  Vitals:   12/21/23 0840 12/21/23 0845  BP: (!) 168/62   Pulse: (!) 52 80  Resp: 12 11  Temp:    SpO2: 100% 99%    Complications: No apparent anesthesia complications

## 2023-12-21 NOTE — Anesthesia Procedure Notes (Signed)
 Anesthesia Regional Block: Adductor canal block   Pre-Anesthetic Checklist: , timeout performed,  Correct Patient, Correct Site, Correct Laterality,  Correct Procedure, Correct Position, site marked,  Risks and benefits discussed,  Surgical consent,  Pre-op  evaluation,  At surgeon's request and post-op pain management  Laterality: Left and Lower  Prep: chloraprep       Needles:  Injection technique: Single-shot  Needle Type: Echogenic Needle     Needle Length: 9cm  Needle Gauge: 21     Additional Needles:   Procedures:,,,, ultrasound used (permanent image in chart),,    Narrative:  Start time: 12/21/2023 8:31 AM End time: 12/21/2023 8:37 AM Injection made incrementally with aspirations every 5 mL.  Performed by: Personally  Anesthesiologist: Leonce Athens, MD  Additional Notes: Pt identified in Holding room.  Monitors applied. Working IV access confirmed. Sterile prep L thigh.  #21ga ECHOgenic Arrow block needle into adductor canal with US  guidance.  20cc 0.75% Ropivacaine  injected incrementally after negative test dose.  Patient asymptomatic, VSS, no heme aspirated, tolerated well.   JAYSON Leonce, MD

## 2023-12-21 NOTE — Plan of Care (Signed)
   Problem: Activity: Goal: Risk for activity intolerance will decrease Outcome: Progressing   Problem: Nutrition: Goal: Adequate nutrition will be maintained Outcome: Progressing   Problem: Pain Managment: Goal: General experience of comfort will improve and/or be controlled Outcome: Progressing   Problem: Safety: Goal: Ability to remain free from injury will improve Outcome: Progressing

## 2023-12-21 NOTE — Interval H&P Note (Signed)
 History and Physical Interval Note:  12/21/2023 7:36 AM  [name]  has presented today for surgery, with the diagnosis of LEFT  KNEE OSTEOARTHRITIS.  The various methods of treatment have been discussed with the patient and family. After consideration of risks, benefits and other options for treatment, the patient has consented to  Procedure(s): ARTHROPLASTY, KNEE, TOTAL (Left) as a surgical intervention.  The patient's history has been reviewed, patient examined, no change in status, stable for surgery.  I have reviewed the patient's chart and labs.  Questions were answered to the patient's satisfaction.     [name]

## 2023-12-21 NOTE — Evaluation (Signed)
 Physical Therapy Evaluation Patient Details Name: [name] MRN: [id number] DOB: Sep 18, 1940 Today's Date: 12/21/2023  History of Present Illness  83 yo female presents to therapy s/p L TKA on 12/21/2023 due to failure of conservative measures. Pt PMH includes but is not limited to: OA, neuropathy, CKD IIIa, NSTEMI, PAF, angina, RA, chronic pain, B THA, GERD, HTN, anemia, diverticulitis, asthma, ba ca s/p R mastectomy, CAD, ACDF, and cardiac surgeries.  Clinical Impression  Pt admitted with above diagnosis. Pt ambulated 9' with RW, no loss of balance, distance limited by pain/fatigue. Initiated TKA HEP. Good progress expected.  Pt currently with functional limitations due to the deficits listed below (see PT Problem List). Pt will benefit from acute skilled PT to increase their independence and safety with mobility to allow discharge.           If plan is discharge home, recommend the following: A little help with walking and/or transfers;A little help with bathing/dressing/bathroom;Assistance with cooking/housework;Assist for transportation;Help with stairs or ramp for entrance   Can travel by private vehicle        Equipment Recommendations Rolling walker (2 wheels)  Recommendations for Other Services       Functional Status Assessment Patient has had a recent decline in their functional status and demonstrates the ability to make significant improvements in function in a reasonable and predictable amount of time.     Precautions / Restrictions Precautions Precautions: Knee Recall of Precautions/Restrictions: Intact Precaution/Restrictions Comments: reviewed no pillow under knee Restrictions Weight Bearing Restrictions Per Provider Order: No Other Position/Activity Restrictions: WBAT      Mobility  Bed Mobility Overal bed mobility: Modified Independent             General bed mobility comments: HOB up, used rail    Transfers Overall transfer level: Needs  assistance Equipment used: Rolling walker (2 wheels) Transfers: Sit to/from Stand Sit to Stand: Min assist           General transfer comment: VCs hand placement, min A to power up    Ambulation/Gait Ambulation/Gait assistance: Contact guard assist Gait Distance (Feet): 45 Feet Assistive device: Rolling walker (2 wheels) Gait Pattern/deviations: Step-to pattern, Decreased step length - left, Decreased step length - right Gait velocity: decr     General Gait Details: VCs sequencing, no loss of balance, no buckling, distance limited by pain/fatigue  Stairs            Wheelchair Mobility     Tilt Bed    Modified Rankin (Stroke Patients Only)       Balance Overall balance assessment: Modified Independent                                           Pertinent Vitals/Pain Pain Assessment Pain Assessment: Faces Faces Pain Scale: Hurts a little bit Pain Location: L knee Pain Descriptors / Indicators: Sore Pain Intervention(s): Limited activity within patient's tolerance, Monitored during session, Premedicated before session    Home Living Family/patient expects to be discharged to:: Private residence Living Arrangements: Alone Available Help at Discharge: Neighbor Type of Home: House Home Access: Stairs to enter Entrance Stairs-Rails: None Entrance Stairs-Number of Steps: 1   Home Layout: One level Home Equipment: Grab bars - toilet;Grab bars - tub/shower;Cane - single point      Prior Function Prior Level of Function : Independent/Modified Independent  Mobility Comments: walks with SPC ADLs Comments: independent     Extremity/Trunk Assessment   Upper Extremity Assessment Upper Extremity Assessment: Overall WFL for tasks assessed    Lower Extremity Assessment Lower Extremity Assessment: LLE deficits/detail LLE Deficits / Details: 3/5 SLR, knee AAROM 5-60* LLE Sensation: decreased light touch (suspect spinal not  fully worn off, RLE intact to light touch) LLE Coordination: WNL    Cervical / Trunk Assessment Cervical / Trunk Assessment: Normal  Communication   Communication Communication: Impaired Factors Affecting Communication: Hearing impaired    Cognition Arousal: Alert Behavior During Therapy: WFL for tasks assessed/performed   PT - Cognitive impairments: No apparent impairments                         Following commands: Intact       Cueing Cueing Techniques: Verbal cues     General Comments      Exercises Total Joint Exercises Ankle Circles/Pumps: AROM, Both, 15 reps, Supine Heel Slides: AAROM, Left, 10 reps, Supine   Assessment/Plan    PT Assessment Patient needs continued PT services  PT Problem List Decreased strength;Decreased range of motion;Decreased activity tolerance;Decreased balance;Decreased mobility;Pain;Decreased knowledge of use of DME       PT Treatment Interventions Gait training;Therapeutic exercise;DME instruction;Therapeutic activities;Stair training;Balance training    PT Goals (Current goals can be found in the Care Plan section)  Acute Rehab PT Goals Patient Stated Goal: [place] PT Goal Formulation: With patient/family Time For Goal Achievement: 12/28/23 Potential to Achieve Goals: Good    Frequency 7X/week     Co-evaluation               AM-PAC PT 6 Clicks Mobility  Outcome Measure Help needed turning from your back to your side while in a flat bed without using bedrails?: A Little Help needed moving from lying on your back to sitting on the side of a flat bed without using bedrails?: A Little Help needed moving to and from a bed to a chair (including a wheelchair)?: A Little Help needed standing up from a chair using your arms (e.g., wheelchair or bedside chair)?: A Little Help needed to walk in hospital room?: A Little Help needed climbing 3-5 steps with a railing? : A Lot 6 Click Score:  17    End of Session Equipment Utilized During Treatment: Gait belt Activity Tolerance: Patient tolerated treatment well Patient left: in chair;with chair alarm set;with call bell/phone within reach;with family/visitor present Nurse Communication: Mobility status PT Visit Diagnosis: Difficulty in walking, not elsewhere classified (R26.2);Pain Pain - Right/Left: Left Pain - part of body: Knee    Time: 8546-8484 PT Time Calculation (min) (ACUTE ONLY): 22 min   Charges:   PT Evaluation $PT Eval Moderate Complexity: 1 Mod   PT General Charges $$ ACUTE PT VISIT: 1 Visit         Sylvan Delon Copp PT 12/21/2023  Acute Rehabilitation Services  Office [phone]

## 2023-12-21 NOTE — Anesthesia Postprocedure Evaluation (Signed)
 Anesthesia Post Note  Patient: [name]  Procedure(s) Performed: ARTHROPLASTY, KNEE, TOTAL (Left: Knee)     Patient location during evaluation: PACU Anesthesia Type: Spinal Level of consciousness: awake and alert, oriented and patient cooperative Pain management: pain level controlled Vital Signs Assessment: post-procedure vital signs reviewed and stable Respiratory status: spontaneous breathing, nonlabored ventilation and respiratory function stable Cardiovascular status: blood pressure returned to baseline and stable Postop Assessment: no apparent nausea or vomiting, patient able to bend at knees, spinal receding and adequate PO intake Anesthetic complications: no   No notable events documented.  Last Vitals:  Vitals:   12/21/23 1327 12/21/23 1330  BP:  (!) 165/59  Pulse: (!) 43 96  Resp: 18 17  Temp:    SpO2: 98% 98%    Last Pain:  Vitals:   12/21/23 1330  TempSrc:   PainSc: 4                  Marvena Tally,E. Shiv Shuey

## 2023-12-21 NOTE — Anesthesia Procedure Notes (Signed)
 Spinal  Patient location during procedure: OR End time: 12/21/2023 9:20 AM Reason for block: surgical anesthesia Staffing Performed: anesthesiologist  Anesthesiologist: Leonce Athens, MD Resident/CRNA: Vincenzo Show, CRNA Performed by: Leonce Athens, MD Authorized by: Leonce Athens, MD   Preanesthetic Checklist Completed: patient identified, IV checked, site marked, risks and benefits discussed, surgical consent, monitors and equipment checked, pre-op  evaluation and timeout performed Spinal Block Patient position: sitting Prep: DuraPrep Patient monitoring: heart rate, cardiac monitor, continuous pulse ox and blood pressure Approach: midline Location: L3-4 Injection technique: single-shot Needle Needle type: Pencan and Introducer  Needle gauge: 24 G Needle length: 9 cm Assessment Sensory level: T4 Events: CSF return and second provider Additional Notes Pt identified in Operating room.  Monitors applied. Working IV access confirmed. Sterile prep, drape lumbar spine.  1% lido local L 3,4.  CRNA all attempts os, MD #[id number] Pencan into clear CSF L 3,4.  12mg  0.75% Bupivacaine  with dextrose  injected with asp CSF beginning and end of injection.  Patient asymptomatic, VSS, no heme aspirated, tolerated well.  [name] Leonce, MD

## 2023-12-21 NOTE — Op Note (Signed)
 PATIENT ID:      [name]  MRN:     [id number] DOB/AGE:    1940-06-15 / 83 y.o.       OPERATIVE REPORT   DATE OF PROCEDURE:  12/21/2023      PREOPERATIVE DIAGNOSIS:   LEFT  KNEE OSTEOARTHRITIS      Estimated body mass index is 24.58 kg/m as calculated from the following:   Height as of this encounter: 5' 5.5 (1.664 m).   Weight as of this encounter: 68 kg.                                                       POSTOPERATIVE DIAGNOSIS:   Same                                                                  PROCEDURE:  Procedure(s): ARTHROPLASTY, KNEE, TOTAL Using DepuyAttune RP implants #6N Femur, #5Tibia, 5 mm Attune RP bearing, 35 Patella    SURGEON: [name]  ASSISTANT:   Signe Reining PA-C   (Present and scrubbed throughout the case, critical for assistance with exposure, retraction, instrumentation, and closure.)        ANESTHESIA: spinal, 20cc Exparel , 50cc 0.25% Marcaine  EBL: min cc FLUID REPLACEMENT: unk cc crystaloid TOURNIQUET: none DRAINS: None TRANEXAMIC ACID : 1gm IV, 2gm topical COMPLICATIONS:  None         INDICATIONS FOR PROCEDURE: The patient has  LEFT  KNEE OSTEOARTHRITIS, varus deformities, XR shows bone on bone arthritis, lateral subluxation of tibia. Patient has failed all conservative measures including anti-inflammatory medicines, narcotics, attempts at exercise and weight loss, cortisone injections and viscosupplementation.  Risks and benefits of surgery have been discussed, questions answered.   DESCRIPTION OF PROCEDURE: The patient identified by armband, received  IV antibiotics, in the holding area at [facility]. Patient taken to the operating room, appropriate anesthetic monitors were attached, and spinal anesthesia was  induced. IV Tranexamic acid  was given. Lateral post and 2 surefoot positioners applied to the table, the lower extremity was then prepped and draped in usual sterile fashion from the toes to the high thigh. Time-out  procedure was performed. Signe Reining PAC, was present and scrubbed throughout the case, critical for assistance with, positioning, exposure, retraction, instrumentation, and closure.The skin and subcutaneous tissue along the incision was injected with 20 cc of a mixture of 20cc Exparel  and 30cc Marcaine  50cc saline solution, using a 21-gauge by 1-1/2 inch needle. We began the operation, with the knee flexed 130 degrees, by making the anterior midline incision starting at handbreadth above the patella going over the patella 1 cm medial to and 4 cm distal to the tibial tubercle. Small bleeders in the skin and the subcutaneous tissue identified and cauterized. Transverse retinaculum was incised and reflected medially and a medial parapatellar arthrotomy was accomplished. the patella was everted and theprepatellar fat pad resected. The superficial medial collateral ligament was then elevated from anterior to posterior along the proximal flare of the tibia and anterior half of the menisci resected. The knee was hyperflexed exposing bone on bone arthritis.  Peripheral and notch osteophytes as well as the cruciate ligaments were then resected. We continued to work our way around posteriorly along the proximal tibia, and externally rotated the tibia subluxing it out from underneath the femur. A McHale PCL retractor was placed through the notch, a lateral Hohmann retractor, and anterolateral small homan retractor placed. We then entered the proximal tibia with the Depuy starter drill in line with the axis of the tibia followed by an intramedullary guide rod and 3 degree posterior slope cutting guide. The tibial cutting guide, was pinned into place allowing resection of 2 mm of bone medially and 10 mm of bone laterally. Satisfied with the tibial resection, we then entered the distal femur 2 mm anterior to the PCL origin with the starter drill, followed by the intramedullary guide rod and applied the distal femoral cutting  guide set at 9 mm, with 5 degrees of valgus. This was pinned along the epicondylar axis. At this point, the distal femoral cut was accomplished without difficulty. We then sized for a #6N femoral component and pinned the chamfer guide in 3 degrees of external rotation. The anterior, posterior, and chamfer cuts were accomplished without difficulty followed by the Attune RP box cutting guide and the box cut. We also removed posterior osteophytes from the posterior femoral condyles. The posterior capsule was injected with Exparel  solution. The knee was brought into full extension. We checked our extension gap and fit a 5 mm trial lollipop. Distracting in extension with a lamina spreader,  bleeders in the posterior capsule, Posterior medial and posterior lateral gutter were cauterized.  The transexamic acid-soaked sponge was then placed in the gap of the knee in extension. The knee was flexed 30. The posterior patella cut was accomplished with the 9.5 mm Attune cutting guide, sized for a 35 mm dome, and the fixation pegs drilled.The knee was then once again hyperflexed exposing the proximal tibia. We sized for a # 5 tibial base plate, applied the smokestack and the conical reamer followed by the the Delta fin keel punch. We then hammered into place the Attune RP trial femoral component, drilled the lugs, inserted a  5 mm trial bearing, trial patellar button, and took the knee through range of motion from 0-130 degrees. Medial and lateral ligamentous stability was checked. No thumb pressure was required for patellar Tracking.  All trial components were removed, mating surfaces irrigated with pulse lavage, and dried with suction and sponges. Exparel  solution was applied to the cancellus bone of the patella distal femur and proximal tibia.  After waiting 30 seconds, the bony surfaces were again, dried with sponges. A double batch of DePuy HV cement was mixed and applied to all bony metallic mating surfaces except for the  posterior condyles of the femur itself. In order, we hammered into place the tibial tray and removed excess cement, the femoral component and removed excess cement. The final Attune RP bearing was inserted, and the knee brought to full extension with compression. The patellar button was clamped into place, and excess cement removed. The knee was held at 30 flexion with compression using the second surefoot, while the cement cured. The wound was irrigated out with normal saline solution pulse lavage. The rest of the Exparel  was injected into the parapatellar arthrotomy, subcutaneous tissues, and periosteal tissues. The parapatellar arthrotomy was closed with running #1 Vicryl suture. The subcutaneous tissue with 3-0 undyed Vicryl suture, and the skin with running 3-0 SQ vicryl. An Aquacil dressing and Ace wrap were applied.  The patient was taken to recovery room without difficulty.   [name] 12/21/2023, 5:15 PM

## 2023-12-22 NOTE — Discharge Summary (Signed)
 Patient ID: [name] MRN: [id number] DOB/AGE: 1940/08/02 83 y.o.  Admit date: 12/21/2023 Discharge date: 12/22/2023  Admission Diagnoses:  Principal Problem:   Primary osteoarthritis of left knee Active Problems:   S/P knee surgery   Discharge Diagnoses:  Same  Past Medical History:  Diagnosis Date   Anemia    Arthritis    Breast cancer (HCC)    Right mastectomy   Chronic back pain    Coronary atherosclerosis of native coronary artery    a. 12/2019 Cath: LM nl, LAD 11m, LCX 50p, 80d, RCA 20p, 13m-->med rx.   Demand ischemia (HCC)    a. 02/2021 HsTrop to 361 in setting of rapid afib; b. 02/2021 Echo: EF 65-70%, no rwma, mild LVH, RVSP 28.60mmHg. Mildly to mod dil LA. Triv MR. Ao sclerosis w/o stenosis.   Dysrhythmia    A. Fib   Environmental allergies    Essential hypertension    GERD (gastroesophageal reflux disease)    History of bronchitis    History of colon polyps    History of diverticulosis    History of migraine    Panic attacks    Persistent atrial fibrillation (HCC) 02/2021   a. CHA2DS2VASc = 5-->xarelto .   Prolapse of vaginal vault after hysterectomy    Seasonal allergies    Urinary frequency     Surgeries: Procedure(s): Left ARTHROPLASTY, KNEE, TOTAL on 12/21/2023   Consultants:   Discharged Condition: Improved  Hospital Course: [name] is an 83 y.o. female who was admitted 12/21/2023 for operative treatment ofPrimary osteoarthritis of left knee. Patient has severe unremitting pain that affects sleep, daily activities, and work/hobbies. After pre-op  clearance the patient was taken to the operating room on 12/21/2023 and underwent  Procedure(s): Left ARTHROPLASTY, KNEE, TOTAL.    Patient was given perioperative antibiotics:  Anti-infectives (From admission, onward)    Start     Dose/Rate Route Frequency Ordered Stop   12/21/23 1130  ceFAZolin  (ANCEF ) IVPB 2g/100 mL premix        2 g 200 mL/hr over 30 Minutes Intravenous  Once 12/21/23 1116  12/21/23 1613   12/21/23 0700  ceFAZolin  (ANCEF ) IVPB 2g/100 mL premix        2 g 200 mL/hr over 30 Minutes Intravenous On call to O.R. 12/21/23 9353 12/21/23 9072        Patient was given sequential compression devices, early ambulation, and chemoprophylaxis to prevent DVT.  Inpatient Morphine  Milligram Equivalents Per Day 8/25 - 8/26   Values displayed are in units of MME/Day    Order Start / End Date Yesterday Today    oxyCODONE  (Oxy IR/ROXICODONE ) immediate release tablet 5 mg 8/25 - 8/25 7.5 of Unknown --    oxyCODONE  (ROXICODONE ) 5 MG/5ML solution 5 mg 8/25 - 8/25 0 of Unknown --      Group total: 7.5 of Unknown     fentaNYL  (SUBLIMAZE ) injection 50-100 mcg 8/25 - 8/25 15 of 15-30 --    HYDROmorphone  (DILAUDID ) injection 0.25-0.5 mg 8/25 - 8/25 40 of 40-80 --    meperidine  (DEMEROL ) injection 6.25-12.5 mg 8/25 - 8/25 0 of 7.5-15 --    HYDROmorphone  (DILAUDID ) injection 0.5-1 mg 8/25 - No end date 20 of 30-60 0 of 60-120    oxyCODONE  (Oxy IR/ROXICODONE ) immediate release tablet 5-10 mg 8/25 - No end date 15 of 22.5-45 22.5 of 45-90    HYDROmorphone  (DILAUDID ) injection 0.25-0.5 mg 8/25 - 8/25 20 of 40-80 --    Daily Totals  117.5 of Unknown (  at least 155-310) 22.5 of 105-210    Calculation Errors     Order Type Date Details   oxyCODONE  (Oxy IR/ROXICODONE ) immediate release tablet 5 mg Ordered Dose -- Insufficient frequency information   oxyCODONE  (ROXICODONE ) 5 MG/5ML solution 5 mg Ordered Dose -- Insufficient frequency information            Patient benefited maximally from hospital stay and there were no complications.    Recent vital signs: Patient Vitals for the past 24 hrs:  BP Temp Temp src Pulse Resp SpO2  12/22/23 0823 (!) 189/71 -- -- 68 -- 100 %  12/22/23 0518 (!) 173/89 97.7 F (36.5 C) -- 64 18 99 %  12/22/23 0206 (!) 175/67 -- -- (!) 59 18 100 %  12/22/23 0131 (!) 184/56 97.6 F (36.4 C) -- (!) 57 18 100 %  12/21/23 2135 (!) 187/62 98 F (36.7 C) -- 61  18 100 %  12/21/23 1711 (!) 154/68 97.8 F (36.6 C) Oral 60 16 96 %  12/21/23 1409 (!) 180/71 (!) 97.4 F (36.3 C) Oral (!) 50 16 98 %  12/21/23 1330 (!) 165/59 -- -- 96 17 98 %  12/21/23 1327 -- -- -- (!) 43 18 98 %  12/21/23 1315 (!) 163/70 -- -- 92 18 99 %  12/21/23 1310 -- -- -- (!) 58 13 96 %  12/21/23 1303 -- -- -- (!) 56 16 92 %  12/21/23 1300 (!) 166/70 -- -- (!) 42 16 98 %  12/21/23 1247 -- -- -- 60 13 94 %  12/21/23 1245 (!) 164/62 -- -- 93 20 98 %  12/21/23 1244 (!) 164/62 -- -- 96 14 (!) 89 %  12/21/23 1231 (!) 159/71 -- -- -- 14 --  12/21/23 1230 (!) 159/71 -- -- -- 14 98 %  12/21/23 1223 -- -- -- 67 18 93 %  12/21/23 1220 -- -- -- (!) 36 16 94 %  12/21/23 1215 (!) 177/60 -- -- (!) 48 13 90 %  12/21/23 1213 -- -- -- -- 19 91 %  12/21/23 1207 -- -- -- -- 16 --  12/21/23 1204 -- -- -- 63 16 97 %  12/21/23 1202 (!) 180/80 -- -- -- 16 --  12/21/23 1200 (!) 180/80 -- -- 60 12 98 %  12/21/23 1159 -- -- -- 60 16 98 %  12/21/23 1154 -- -- -- 95 (!) 21 96 %  12/21/23 1152 -- -- -- 99 (!) 22 100 %  12/21/23 1147 (!) 187/120 -- -- (!) 150 19 (!) 86 %  12/21/23 1145 (!) 187/120 -- -- 93 (!) 22 96 %  12/21/23 1141 -- -- -- 93 19 96 %  12/21/23 1136 -- -- -- (!) 39 18 96 %  12/21/23 1130 (!) 185/62 -- -- 93 14 97 %  12/21/23 1125 -- -- -- (!) 55 14 99 %  12/21/23 1115 (!) 157/64 (!) 97.3 F (36.3 C) -- (!) 43 12 100 %  12/21/23 0845 -- -- -- 80 11 99 %  12/21/23 0840 (!) 168/62 -- -- (!) 52 12 100 %  12/21/23 0835 (!) 167/58 -- -- (!) 47 13 99 %     Recent laboratory studies: No results for input(s): WBC, HGB, HCT, PLT, NA, K, CL, CO2, BUN, CREATININE, GLUCOSE, INR, CALCIUM  in the last 72 hours.  Invalid input(s): PT, 2   Discharge Medications:   Allergies as of 12/22/2023       Reactions   Penicillins Anaphylaxis, Swelling,  Other (See Comments)   Blisters   Eliquis  [apixaban ] Hives        Medication List     STOP taking these  medications    HYDROcodone -acetaminophen  5-325 MG tablet Commonly known as: NORCO/VICODIN   HYDROcodone -acetaminophen  7.5-325 MG tablet Commonly known as: NORCO       TAKE these medications    albuterol  108 (90 Base) MCG/ACT inhaler Commonly known as: VENTOLIN  HFA Inhale 1-2 puffs into the lungs every 6 (six) hours as needed for shortness of breath or wheezing.   Blood Pressure Monitor Devi 1 each by Does not apply route daily.   budesonide-formoterol 160-4.5 MCG/ACT inhaler Commonly known as: SYMBICORT Inhale 2 puffs into the lungs daily as needed (respiratory issues.).   diphenhydrAMINE  25 MG tablet Commonly known as: BENADRYL  Take 25 mg by mouth daily as needed for allergies.   docusate sodium  100 MG capsule Commonly known as: COLACE Take 100 mg by mouth daily as needed for mild constipation.   gabapentin  300 MG capsule Commonly known as: NEURONTIN  Take 300 mg by mouth daily.   losartan  50 MG tablet Commonly known as: COZAAR  Take 1 tablet (50 mg total) by mouth daily.   metoprolol  succinate 50 MG 24 hr tablet Commonly known as: TOPROL -XL Take 1 tablet (50 mg total) by mouth in the morning and at bedtime. Take with or immediately following a meal. What changed: Another medication with the same name was changed. Make sure you understand how and when to take each.   metoprolol  succinate 25 MG 24 hr tablet Commonly known as: Toprol  XL Take 1 tablet (25 mg total) by mouth daily. Take in addition to 50 mg in the AM What changed: when to take this   nitroGLYCERIN  0.4 MG SL tablet Commonly known as: NITROSTAT  Place 1 tablet (0.4 mg total) under the tongue every 5 (five) minutes as needed for chest pain.   omeprazole  20 MG capsule Commonly known as: PRILOSEC Take 20 mg by mouth daily as needed (indigestion/heartburn).   oxyCODONE -acetaminophen  5-325 MG tablet Commonly known as: PERCOCET/ROXICET Take 1 tablet by mouth every 4 (four) hours as needed for severe pain  (pain score 7-10).   Rivaroxaban  15 MG Tabs tablet Commonly known as: Xarelto  Take 1 tablet (15 mg total) by mouth daily with supper.   rosuvastatin  5 MG tablet Commonly known as: CRESTOR  Take 1 tablet (5 mg total) by mouth at bedtime.   tiZANidine  2 MG tablet Commonly known as: ZANAFLEX  Take 1 tablet (2 mg total) by mouth every 8 (eight) hours as needed for muscle spasms.               Durable Medical Equipment  (From admission, onward)           Start     Ordered   12/21/23 1404  DME Walker rolling  Once       Question:  Patient needs a walker to treat with the following condition  Answer:  Primary osteoarthritis of left knee   12/21/23 1403   12/21/23 1404  DME 3 n 1  Once        12/21/23 1403              Discharge Care Instructions  (From admission, onward)           Start     Ordered   12/22/23 0000  Weight bearing as tolerated       Question Answer Comment  Laterality left   Extremity Lower  12/22/23 0829   12/21/23 0000  Weight bearing as tolerated       Question Answer Comment  Laterality left   Extremity Lower      12/21/23 1115            Diagnostic Studies: DG Chest 2 View Result Date: 12/15/2023 CLINICAL DATA:  Preop chest exam.  Upcoming knee surgery. EXAM: CHEST - 2 VIEW COMPARISON:  Radiograph and CT 10/30/2021 FINDINGS: The heart is normal in size. Mediastinal contours are stable. Apical emphysema. Subpleural reticulation, greatest in the lung bases, similar to prior. No confluent consolidation. No pleural effusion or pneumothorax. No pulmonary edema. Right chest wall/breast surgical clips. IMPRESSION: Underlying chronic lung disease, emphysema versus interstitial lung disease. No acute findings. Electronically Signed   By: Andrea Gasman M.D.   On: 12/15/2023 16:11    Disposition: Discharge disposition: 01-Home or Self Care       Discharge Instructions     Call MD / Call 911   Complete by: As directed    If  you experience chest pain or shortness of breath, CALL 911 and be transported to the hospital emergency room.  If you develope a fever above 101 F, pus (white drainage) or increased drainage or redness at the wound, or calf pain, call your surgeon's office.   Call MD / Call 911   Complete by: As directed    If you experience chest pain or shortness of breath, CALL 911 and be transported to the hospital emergency room.  If you develope a fever above 101 F, pus (white drainage) or increased drainage or redness at the wound, or calf pain, call your surgeon's office.   Constipation Prevention   Complete by: As directed    Drink plenty of fluids.  Prune juice may be helpful.  You may use a stool softener, such as Colace (over the counter) 100 mg twice a day.  Use MiraLax  (over the counter) for constipation as needed.   Constipation Prevention   Complete by: As directed    Drink plenty of fluids.  Prune juice may be helpful.  You may use a stool softener, such as Colace (over the counter) 100 mg twice a day.  Use MiraLax  (over the counter) for constipation as needed.   Diet general   Complete by: As directed    Diet general   Complete by: As directed    Do not put a pillow under the knee. Place it under the heel.   Complete by: As directed    Do not put a pillow under the knee. Place it under the heel.   Complete by: As directed    Increase activity slowly as tolerated   Complete by: As directed    Increase activity slowly as tolerated   Complete by: As directed    Post-operative opioid taper instructions:   Complete by: As directed    POST-OPERATIVE OPIOID TAPER INSTRUCTIONS: It is important to wean off of your opioid medication as soon as possible. If you do not need pain medication after your surgery it is ok to stop day one. Opioids include: Codeine, Hydrocodone (Norco, Vicodin), Oxycodone (Percocet, oxycontin ) and hydromorphone  amongst others.  Long term and even short term use of opiods can  cause: Increased pain response Dependence Constipation Depression Respiratory depression And more.  Withdrawal symptoms can include Flu like symptoms Nausea, vomiting And more Techniques to manage these symptoms Hydrate well Eat regular healthy meals Stay active Use relaxation techniques(deep breathing, meditating, yoga) Do Not substitute  Alcohol to help with tapering If you have been on opioids for less than two weeks and do not have pain than it is ok to stop all together.  Plan to wean off of opioids This plan should start within one week post op of your joint replacement. Maintain the same interval or time between taking each dose and first decrease the dose.  Cut the total daily intake of opioids by one tablet each day Next start to increase the time between doses. The last dose that should be eliminated is the evening dose.      Post-operative opioid taper instructions:   Complete by: As directed    POST-OPERATIVE OPIOID TAPER INSTRUCTIONS: It is important to wean off of your opioid medication as soon as possible. If you do not need pain medication after your surgery it is ok to stop day one. Opioids include: Codeine, Hydrocodone (Norco, Vicodin), Oxycodone (Percocet, oxycontin ) and hydromorphone  amongst others.  Long term and even short term use of opiods can cause: Increased pain response Dependence Constipation Depression Respiratory depression And more.  Withdrawal symptoms can include Flu like symptoms Nausea, vomiting And more Techniques to manage these symptoms Hydrate well Eat regular healthy meals Stay active Use relaxation techniques(deep breathing, meditating, yoga) Do Not substitute Alcohol to help with tapering If you have been on opioids for less than two weeks and do not have pain than it is ok to stop all together.  Plan to wean off of opioids This plan should start within one week post op of your joint replacement. Maintain the same interval or  time between taking each dose and first decrease the dose.  Cut the total daily intake of opioids by one tablet each day Next start to increase the time between doses. The last dose that should be eliminated is the evening dose.      Weight bearing as tolerated   Complete by: As directed    Laterality: left   Extremity: Lower   Weight bearing as tolerated   Complete by: As directed    Laterality: left   Extremity: Lower        Follow-up Information     Yvone Rush, MD. Schedule an appointment as soon as possible for a visit in 2 week(s).   Specialty: Orthopedic Surgery Contact information: [address] [place] [address] [phone]                  Signed: Lynwood KANDICE Reining 12/22/2023, 8:30 AM

## 2023-12-22 NOTE — Progress Notes (Signed)
 Notified On Call physician per protocol re: hypertension--systolic >160 AT approximately 0220.  Message left with after hours answering service.

## 2023-12-22 NOTE — TOC Transition Note (Signed)
 Transition of Care [facility]) - Discharge Note   Patient Details  Name: [name] MRN: [id number] Date of Birth: November 18, 1940  Transition of Care [facility]) CM/SW Contact:  NORMAN ASPEN, LCSW Phone Number: 12/22/2023, 10:23 AM   Clinical Narrative:     Met with pt and confirming she has received RW to room via [employer].  OPPT already arranged for pt in [place] and she is aware. No further TOC needs.  Final next level of care: OP Rehab Barriers to Discharge: No Barriers Identified   Patient Goals and CMS Choice Patient states their goals for this hospitalization and ongoing recovery are:: return home          Discharge Placement                       Discharge Plan and Services Additional resources added to the After Visit Summary for                  DME Arranged: Walker rolling DME Agency: [employer]                  Social Drivers of Health (SDOH) Interventions SDOH Screenings   Food Insecurity: No Food Insecurity (12/21/2023)  Housing: Low Risk  (12/21/2023)  Transportation Needs: No Transportation Needs (12/21/2023)  Utilities: Not At Risk (12/21/2023)  Financial Resource Strain: Low Risk  (12/03/2023)   Received from [facility]  Physical Activity: Insufficiently Active (12/03/2023)   Received from [facility]  Social Connections: Socially Integrated (12/21/2023)  Stress: No Stress Concern Present (12/03/2023)   Received from [facility]  Tobacco Use: Medium Risk (12/21/2023)     Readmission Risk Interventions     No data to display

## 2023-12-22 NOTE — Plan of Care (Signed)
  Problem: Clinical Measurements: Goal: Will remain free from infection Outcome: Progressing   Problem: Nutrition: Goal: Adequate nutrition will be maintained Outcome: Progressing   Problem: Coping: Goal: Level of anxiety will decrease Outcome: Progressing   Problem: Elimination: Goal: Will not experience complications related to bowel motility Outcome: Progressing Goal: Will not experience complications related to urinary retention Outcome: Progressing   Problem: Safety: Goal: Ability to remain free from injury will improve Outcome: Progressing   Problem: Activity: Goal: Range of joint motion will improve Outcome: Progressing   Problem: Pain Management: Goal: Pain level will decrease with appropriate interventions Outcome: Progressing

## 2023-12-22 NOTE — Progress Notes (Signed)
 Physical Therapy Treatment Patient Details Name: [name] MRN: [id number] DOB: November 05, 1940 Today's Date: 12/22/2023   History of Present Illness 83 yo female presents to therapy s/p L TKA on 12/21/2023 due to failure of conservative measures. Pt PMH includes but is not limited to: OA, neuropathy, CKD IIIa, NSTEMI, PAF, angina, RA, chronic pain, B THA, GERD, HTN, anemia, diverticulitis, asthma, ba ca s/p R mastectomy, CAD, ACDF, and cardiac surgeries.    PT Comments  Pt is progressing well with mobility, she is ready to DC home from a PT standpoint. She ambulated 41' with RW, completed stair training, and demonstrates good understanding of HEP.     If plan is discharge home, recommend the following: A little help with bathing/dressing/bathroom;Assistance with cooking/housework;Assist for transportation;Help with stairs or ramp for entrance   Can travel by private vehicle        Equipment Recommendations  Rolling walker (2 wheels)    Recommendations for Other Services       Precautions / Restrictions Precautions Precautions: Knee Precaution Booklet Issued: Yes (comment) Recall of Precautions/Restrictions: Intact Precaution/Restrictions Comments: reviewed no pillow under knee Restrictions Weight Bearing Restrictions Per Provider Order: No Other Position/Activity Restrictions: WBAT     Mobility  Bed Mobility               General bed mobility comments: up in recliner    Transfers Overall transfer level: Modified independent Equipment used: Rolling walker (2 wheels) Transfers: Sit to/from Stand Sit to Stand: Modified independent (Device/Increase time)           General transfer comment: good recall of safe hand placement    Ambulation/Gait Ambulation/Gait assistance: Modified independent (Device/Increase time), Supervision Gait Distance (Feet): 55 Feet Assistive device: Rolling walker (2 wheels) Gait Pattern/deviations: Step-to pattern, Decreased step  length - left, Decreased step length - right Gait velocity: decr     General Gait Details: good recall of sequencing, no loss of balance, VCs to lift head, distance limited by pain   Stairs Stairs: Yes Stairs assistance: Min assist Stair Management: No rails, Backwards, Forwards, With walker Number of Stairs: 1 General stair comments: pt performed both backwards and forwards technique for 1 step with min A to steady RW, VCs sequencing   Wheelchair Mobility     Tilt Bed    Modified Rankin (Stroke Patients Only)       Balance Overall balance assessment: Modified Independent                                          Communication Communication Communication: Impaired Factors Affecting Communication: Hearing impaired  Cognition Arousal: Alert Behavior During Therapy: WFL for tasks assessed/performed   PT - Cognitive impairments: No apparent impairments                         Following commands: Intact      Cueing Cueing Techniques: Verbal cues  Exercises Total Joint Exercises Ankle Circles/Pumps: AROM, Both, 15 reps, Supine Quad Sets: AROM, Both, 5 reps, Supine Short Arc Quad: AROM, Left, 5 reps, Supine Heel Slides: AAROM, Left, Supine, 5 reps Hip ABduction/ADduction: AAROM, Left, 5 reps, Supine Straight Leg Raises: AROM, Left, 5 reps, Supine Long Arc Quad: AROM, Left, 5 reps, Seated Knee Flexion: AAROM, Left, 10 reps, Seated Goniometric ROM: 0-85* AAROM L knee    General Comments  Pertinent Vitals/Pain Pain Assessment Pain Score: 9  Pain Location: L knee and dorsum of L foot (pt reports this is chronic) Pain Descriptors / Indicators: Sore Pain Intervention(s): Limited activity within patient's tolerance, Monitored during session, Premedicated before session, Patient requesting pain meds-RN notified, Ice applied, Repositioned    Home Living                          Prior Function            PT Goals  (current goals can now be found in the care plan section) Acute Rehab PT Goals Patient Stated Goal: [place] beach and [place] PT Goal Formulation: With patient/family Time For Goal Achievement: 12/28/23 Potential to Achieve Goals: Good Progress towards PT goals: Progressing toward goals    Frequency    7X/week      PT Plan      Co-evaluation              AM-PAC PT 6 Clicks Mobility   Outcome Measure  Help needed turning from your back to your side while in a flat bed without using bedrails?: None Help needed moving from lying on your back to sitting on the side of a flat bed without using bedrails?: None Help needed moving to and from a bed to a chair (including a wheelchair)?: None Help needed standing up from a chair using your arms (e.g., wheelchair or bedside chair)?: None Help needed to walk in hospital room?: None Help needed climbing 3-5 steps with a railing? : A Little 6 Click Score: 23    End of Session Equipment Utilized During Treatment: Gait belt Activity Tolerance: Patient tolerated treatment well Patient left: in chair;with chair alarm set;with call bell/phone within reach Nurse Communication: Mobility status PT Visit Diagnosis: Difficulty in walking, not elsewhere classified (R26.2);Pain Pain - Right/Left: Left Pain - part of body: Knee     Time: 9152-9079 PT Time Calculation (min) (ACUTE ONLY): 33 min  Charges:    $Gait Training: 8-22 mins $Therapeutic Exercise: 8-22 mins PT General Charges $$ ACUTE PT VISIT: 1 Visit                    Sylvan Delon Copp PT 12/22/2023  Acute Rehabilitation Services  Office [phone]

## 2023-12-22 NOTE — Progress Notes (Signed)
 Subjective: 1 Day Post-Op Procedure(s) (LRB): ARTHROPLASTY, KNEE, TOTAL (Left) Patient reports pain as mild.  The patient is taking by mouth and voiding okay.  She wants to go home today after therapy.  Objective: Vital signs in last 24 hours: Temp:  [97.3 F (36.3 C)-98 F (36.7 C)] 97.7 F (36.5 C) (08/26 0518) Pulse Rate:  [36-150] 68 (08/26 0823) Resp:  [11-22] 18 (08/26 0518) BP: (154-189)/(56-120) 189/71 (08/26 0823) SpO2:  [86 %-100 %] 100 % (08/26 0823)  Intake/Output from previous day: 08/25 0701 - 08/26 0700 In: 2644.2 [P.O.:510; I.V.:1934.2; IV Piggyback:200] Out: 350 [Urine:300; Blood:50] Intake/Output this shift: No intake/output data recorded.  No results for input(s): HGB in the last 72 hours. No results for input(s): WBC, RBC, HCT, PLT in the last 72 hours. No results for input(s): NA, K, CL, CO2, BUN, CREATININE, GLUCOSE, CALCIUM  in the last 72 hours. No results for input(s): LABPT, INR in the last 72 hours. Left knee exam: Neurovascular intact Sensation intact distally Intact pulses distally Dorsiflexion/Plantar flexion intact No cellulitis present Compartment soft   Assessment/Plan: 1 Day Post-Op Procedure(s) (LRB): ARTHROPLASTY, KNEE, TOTAL (Left) Plan: Up with therapy Weight-bear as tolerated on the left. Discharge home today.  She will resume her Xarelto  once she gets home. She already has pain medication and muscle relaxers at home. Will need home health physical therapy x 2 weeks then will do outpatient therapy. Follow-up with Dr. Yvone in 2 weeks.   Anticipated LOS equal to or greater than 2 midnights due to - Age 84 and older with one or more of the following:  - Obesity  - Expected need for hospital services (PT, OT, Nursing) required for safe  discharge  - Anticipated need for postoperative skilled nursing care or inpatient rehab  - Active co-morbidities: None OR   - Unanticipated findings during/Post  Surgery: None  - Patient is a high risk of re-admission due to: None   Lynwood KANDICE Reining 12/22/2023, 8:24 AM

## 2023-12-23 ENCOUNTER — Encounter (HOSPITAL_COMMUNITY): Payer: Self-pay | Admitting: Orthopedic Surgery

## 2023-12-24 ENCOUNTER — Encounter (HOSPITAL_COMMUNITY): Payer: Self-pay

## 2023-12-24 ENCOUNTER — Inpatient Hospital Stay (HOSPITAL_COMMUNITY)
Admission: RE | Admit: 2023-12-24 | Discharge: 2023-12-28 | DRG: 981 | Disposition: A | Discharge status: Home / Self Care | Source: Other Acute Inpatient Hospital | Attending: Cardiology | Admitting: Cardiology

## 2023-12-24 DIAGNOSIS — E039 Hypothyroidism, unspecified: Secondary | ICD-10-CM | POA: Diagnosis present

## 2023-12-24 DIAGNOSIS — E876 Hypokalemia: Secondary | ICD-10-CM | POA: Diagnosis not present

## 2023-12-24 DIAGNOSIS — M19071 Primary osteoarthritis, right ankle and foot: Secondary | ICD-10-CM | POA: Diagnosis present

## 2023-12-24 DIAGNOSIS — Z7901 Long term (current) use of anticoagulants: Secondary | ICD-10-CM

## 2023-12-24 DIAGNOSIS — I2510 Atherosclerotic heart disease of native coronary artery without angina pectoris: Secondary | ICD-10-CM | POA: Diagnosis present

## 2023-12-24 DIAGNOSIS — K219 Gastro-esophageal reflux disease without esophagitis: Secondary | ICD-10-CM | POA: Diagnosis present

## 2023-12-24 DIAGNOSIS — R079 Chest pain, unspecified: Secondary | ICD-10-CM | POA: Diagnosis present

## 2023-12-24 DIAGNOSIS — Z853 Personal history of malignant neoplasm of breast: Secondary | ICD-10-CM

## 2023-12-24 DIAGNOSIS — E1122 Type 2 diabetes mellitus with diabetic chronic kidney disease: Secondary | ICD-10-CM | POA: Diagnosis present

## 2023-12-24 DIAGNOSIS — Z860100 Personal history of colon polyps, unspecified: Secondary | ICD-10-CM

## 2023-12-24 DIAGNOSIS — G894 Chronic pain syndrome: Secondary | ICD-10-CM | POA: Diagnosis present

## 2023-12-24 DIAGNOSIS — I4819 Other persistent atrial fibrillation: Secondary | ICD-10-CM | POA: Diagnosis present

## 2023-12-24 DIAGNOSIS — I5031 Acute diastolic (congestive) heart failure: Secondary | ICD-10-CM | POA: Diagnosis present

## 2023-12-24 DIAGNOSIS — R4182 Altered mental status, unspecified: Secondary | ICD-10-CM | POA: Diagnosis present

## 2023-12-24 DIAGNOSIS — I119 Hypertensive heart disease without heart failure: Secondary | ICD-10-CM | POA: Diagnosis present

## 2023-12-24 DIAGNOSIS — Z833 Family history of diabetes mellitus: Secondary | ICD-10-CM

## 2023-12-24 DIAGNOSIS — Z8249 Family history of ischemic heart disease and other diseases of the circulatory system: Secondary | ICD-10-CM

## 2023-12-24 DIAGNOSIS — I110 Hypertensive heart disease with heart failure: Secondary | ICD-10-CM | POA: Diagnosis present

## 2023-12-24 DIAGNOSIS — Z96643 Presence of artificial hip joint, bilateral: Secondary | ICD-10-CM | POA: Diagnosis present

## 2023-12-24 DIAGNOSIS — I471 Supraventricular tachycardia, unspecified: Secondary | ICD-10-CM | POA: Diagnosis not present

## 2023-12-24 DIAGNOSIS — I493 Ventricular premature depolarization: Secondary | ICD-10-CM | POA: Diagnosis present

## 2023-12-24 DIAGNOSIS — Z825 Family history of asthma and other chronic lower respiratory diseases: Secondary | ICD-10-CM

## 2023-12-24 DIAGNOSIS — I214 Non-ST elevation (NSTEMI) myocardial infarction: Principal | ICD-10-CM | POA: Diagnosis present

## 2023-12-24 DIAGNOSIS — M059 Rheumatoid arthritis with rheumatoid factor, unspecified: Secondary | ICD-10-CM | POA: Diagnosis present

## 2023-12-24 DIAGNOSIS — Z90710 Acquired absence of both cervix and uterus: Secondary | ICD-10-CM

## 2023-12-24 DIAGNOSIS — Z66 Do not resuscitate: Secondary | ICD-10-CM | POA: Diagnosis present

## 2023-12-24 DIAGNOSIS — I472 Ventricular tachycardia, unspecified: Secondary | ICD-10-CM | POA: Diagnosis not present

## 2023-12-24 DIAGNOSIS — M069 Rheumatoid arthritis, unspecified: Secondary | ICD-10-CM | POA: Diagnosis present

## 2023-12-24 DIAGNOSIS — E1142 Type 2 diabetes mellitus with diabetic polyneuropathy: Secondary | ICD-10-CM | POA: Diagnosis present

## 2023-12-24 DIAGNOSIS — N1831 Chronic kidney disease, stage 3a: Secondary | ICD-10-CM | POA: Diagnosis present

## 2023-12-24 DIAGNOSIS — D500 Iron deficiency anemia secondary to blood loss (chronic): Secondary | ICD-10-CM | POA: Diagnosis present

## 2023-12-24 DIAGNOSIS — M19041 Primary osteoarthritis, right hand: Secondary | ICD-10-CM | POA: Diagnosis present

## 2023-12-24 DIAGNOSIS — G8918 Other acute postprocedural pain: Secondary | ICD-10-CM | POA: Diagnosis present

## 2023-12-24 DIAGNOSIS — J4489 Other specified chronic obstructive pulmonary disease: Secondary | ICD-10-CM | POA: Diagnosis present

## 2023-12-24 DIAGNOSIS — I252 Old myocardial infarction: Secondary | ICD-10-CM

## 2023-12-24 DIAGNOSIS — J45909 Unspecified asthma, uncomplicated: Secondary | ICD-10-CM | POA: Diagnosis present

## 2023-12-24 DIAGNOSIS — Z96652 Presence of left artificial knee joint: Secondary | ICD-10-CM | POA: Diagnosis present

## 2023-12-24 DIAGNOSIS — Z880 Allergy status to penicillin: Secondary | ICD-10-CM

## 2023-12-24 DIAGNOSIS — Z7982 Long term (current) use of aspirin: Secondary | ICD-10-CM

## 2023-12-24 DIAGNOSIS — D72829 Elevated white blood cell count, unspecified: Secondary | ICD-10-CM | POA: Diagnosis present

## 2023-12-24 DIAGNOSIS — Z9049 Acquired absence of other specified parts of digestive tract: Secondary | ICD-10-CM

## 2023-12-24 DIAGNOSIS — Z79899 Other long term (current) drug therapy: Secondary | ICD-10-CM

## 2023-12-24 DIAGNOSIS — Z9011 Acquired absence of right breast and nipple: Secondary | ICD-10-CM

## 2023-12-24 DIAGNOSIS — Z981 Arthrodesis status: Secondary | ICD-10-CM

## 2023-12-24 DIAGNOSIS — M1712 Unilateral primary osteoarthritis, left knee: Secondary | ICD-10-CM | POA: Diagnosis present

## 2023-12-24 DIAGNOSIS — Z800 Family history of malignant neoplasm of digestive organs: Secondary | ICD-10-CM

## 2023-12-24 DIAGNOSIS — Z801 Family history of malignant neoplasm of trachea, bronchus and lung: Secondary | ICD-10-CM

## 2023-12-24 DIAGNOSIS — I130 Hypertensive heart and chronic kidney disease with heart failure and stage 1 through stage 4 chronic kidney disease, or unspecified chronic kidney disease: Secondary | ICD-10-CM | POA: Diagnosis present

## 2023-12-24 DIAGNOSIS — I4891 Unspecified atrial fibrillation: Secondary | ICD-10-CM | POA: Diagnosis present

## 2023-12-24 DIAGNOSIS — Z87891 Personal history of nicotine dependence: Secondary | ICD-10-CM

## 2023-12-24 DIAGNOSIS — Z888 Allergy status to other drugs, medicaments and biological substances status: Secondary | ICD-10-CM

## 2023-12-24 DIAGNOSIS — M19072 Primary osteoarthritis, left ankle and foot: Secondary | ICD-10-CM | POA: Diagnosis present

## 2023-12-24 DIAGNOSIS — M19042 Primary osteoarthritis, left hand: Secondary | ICD-10-CM | POA: Diagnosis present

## 2023-12-24 DIAGNOSIS — E785 Hyperlipidemia, unspecified: Secondary | ICD-10-CM | POA: Diagnosis present

## 2023-12-24 DIAGNOSIS — Z7951 Long term (current) use of inhaled steroids: Secondary | ICD-10-CM

## 2023-12-24 DIAGNOSIS — R9431 Abnormal electrocardiogram [ECG] [EKG]: Secondary | ICD-10-CM | POA: Diagnosis not present

## 2023-12-24 DIAGNOSIS — I480 Paroxysmal atrial fibrillation: Secondary | ICD-10-CM | POA: Diagnosis not present

## 2023-12-24 MED ORDER — ALBUTEROL SULFATE (2.5 MG/3ML) 0.083% IN NEBU: 2.5000 mg | INHALATION_SOLUTION | Freq: Four times a day (QID) | RESPIRATORY_TRACT | Status: DC | PRN

## 2023-12-24 MED ORDER — OXYCODONE-ACETAMINOPHEN 5-325 MG PO TABS
1.0000 | ORAL_TABLET | ORAL | Status: DC | PRN
  Administered 2023-12-24 – 2023-12-28 (×14): 1 via ORAL
  Filled 2023-12-24 (×14): qty 1

## 2023-12-24 MED ORDER — GABAPENTIN 300 MG PO CAPS
300.0000 mg | ORAL_CAPSULE | Freq: Every day | ORAL | Status: DC
Start: 2023-12-24 — End: 2023-12-28
  Administered 2023-12-24 – 2023-12-28 (×5): 300 mg via ORAL
  Filled 2023-12-24 (×5): qty 1

## 2023-12-24 MED ORDER — ONDANSETRON HCL 4 MG/2ML IJ SOLN: 4.0000 mg | Freq: Four times a day (QID) | INTRAMUSCULAR | Status: DC | PRN

## 2023-12-24 MED ORDER — FLUTICASONE FUROATE-VILANTEROL 200-25 MCG/ACT IN AEPB
1.0000 | INHALATION_SPRAY | Freq: Every day | RESPIRATORY_TRACT | Status: DC
  Administered 2023-12-25 – 2023-12-28 (×4): 1 via RESPIRATORY_TRACT
  Filled 2023-12-24: qty 28

## 2023-12-24 MED ORDER — METOPROLOL SUCCINATE ER 25 MG PO TB24
25.0000 mg | ORAL_TABLET | Freq: Every day | ORAL | Status: DC
  Administered 2023-12-24 – 2023-12-27 (×4): 25 mg via ORAL
  Filled 2023-12-24 (×4): qty 1

## 2023-12-24 MED ORDER — ASPIRIN 81 MG PO TBEC
81.0000 mg | DELAYED_RELEASE_TABLET | Freq: Every day | ORAL | Status: DC
  Administered 2023-12-25 – 2023-12-28 (×4): 81 mg via ORAL
  Filled 2023-12-24 (×4): qty 1

## 2023-12-24 MED ORDER — HEPARIN (PORCINE) 25000 UT/250ML-% IV SOLN
1150.0000 [IU]/h | INTRAVENOUS | Status: DC
  Administered 2023-12-24: 1150 [IU]/h via INTRAVENOUS
  Administered 2023-12-25 – 2023-12-26 (×2): 1250 [IU]/h via INTRAVENOUS
  Filled 2023-12-24 (×3): qty 250

## 2023-12-24 MED ORDER — DOCUSATE SODIUM 100 MG PO CAPS
100.0000 mg | ORAL_CAPSULE | Freq: Every day | ORAL | Status: DC | PRN
  Administered 2023-12-26: 100 mg via ORAL
  Filled 2023-12-24: qty 1

## 2023-12-24 MED ORDER — POLYETHYLENE GLYCOL 3350 17 G PO PACK
17.0000 g | PACK | Freq: Every day | ORAL | Status: DC
  Administered 2023-12-24 – 2023-12-28 (×4): 17 g via ORAL
  Filled 2023-12-24 (×4): qty 1

## 2023-12-24 MED ORDER — ROSUVASTATIN CALCIUM 5 MG PO TABS
5.0000 mg | ORAL_TABLET | Freq: Every day | ORAL | Status: DC
  Administered 2023-12-24 – 2023-12-27 (×4): 5 mg via ORAL
  Filled 2023-12-24 (×4): qty 1

## 2023-12-24 MED ORDER — NITROGLYCERIN 0.4 MG SL SUBL: 0.4000 mg | SUBLINGUAL_TABLET | SUBLINGUAL | Status: DC | PRN

## 2023-12-24 MED ORDER — ACETAMINOPHEN 325 MG PO TABS
650.0000 mg | ORAL_TABLET | ORAL | Status: DC | PRN
Start: 2023-12-24 — End: 2023-12-28
  Administered 2023-12-25 – 2023-12-28 (×12): 650 mg via ORAL
  Filled 2023-12-24 (×12): qty 2

## 2023-12-24 MED ORDER — PANTOPRAZOLE SODIUM 40 MG PO TBEC
40.0000 mg | DELAYED_RELEASE_TABLET | Freq: Every day | ORAL | Status: DC
  Administered 2023-12-24 – 2023-12-28 (×5): 40 mg via ORAL
  Filled 2023-12-24 (×5): qty 1

## 2023-12-24 NOTE — Significant Event (Signed)
 Patient was alert and oriented x 4, displayed capacity.  I ask regarding her wishes if she were to have cardiopulmonary arrest which she want physicians to do chest compressions, shocks, or put a breathing tube down.  She states that she would not want intubation or breathing tube nor chest compressions or shocks.  Reiterated that she has been full code in the past but she states that this is most consistent with what she wishes at this time.  DNR order placed.

## 2023-12-24 NOTE — Progress Notes (Signed)
 PHARMACY - ANTICOAGULATION CONSULT NOTE  Pharmacy Consult:  Heparin  Indication: atrial fibrillation  Allergies  Allergen Reactions   Penicillins Anaphylaxis, Swelling and Other (See Comments)    Blisters   Eliquis  [Apixaban ] Hives    Patient Measurements: Height: 5' 5 (165.1 cm) Weight: 108.1 kg (238 lb 5.1 oz) IBW/kg (Calculated) : 57 HEPARIN  DW (KG): 82.3  Vital Signs: Temp: 97.8 F (36.6 C) (08/28 1856) Temp Source: Oral (08/28 1856) BP: 134/47 (08/28 1856) Pulse Rate: 63 (08/28 1856)  Labs: No results for input(s): HGB, HCT, PLT, APTT, LABPROT, INR, HEPARINUNFRC, HEPRLOWMOCWT, CREATININE, CKTOTAL, CKMB, TROPONINIHS in the last 72 hours.  Estimated Creatinine Clearance: 57.9 mL/min (by C-G formula based on SCr of 0.9 mg/dL).   Medical History: Past Medical History:  Diagnosis Date   Anemia    Arthritis    Breast cancer (HCC)    Right mastectomy   Chronic back pain    Coronary atherosclerosis of native coronary artery    a. 12/2019 Cath: LM nl, LAD 24m, LCX 50p, 80d, RCA 20p, 58m-->med rx.   Demand ischemia (HCC)    a. 02/2021 HsTrop to 361 in setting of rapid afib; b. 02/2021 Echo: EF 65-70%, no rwma, mild LVH, RVSP 28.4mmHg. Mildly to mod dil LA. Triv MR. Ao sclerosis w/o stenosis.   Dysrhythmia    A. Fib   Environmental allergies    Essential hypertension    GERD (gastroesophageal reflux disease)    History of bronchitis    History of colon polyps    History of diverticulosis    History of migraine    Panic attacks    Persistent atrial fibrillation (HCC) 02/2021   a. CHA2DS2VASc = 5-->xarelto .   Prolapse of vaginal vault after hysterectomy    Seasonal allergies    Urinary frequency     Assessment: 68 YOF admitted with Afib RVR and Pharmacy consulted to dose IV heparin .  Patient was on Xarelto  PTA and last took med on 8/27 around 1900.  CBC stable from previous admission.  No bleeding per patient.  Goal of Therapy:  Heparin   level 0.3-0.7 units/ml aPTT 66-102 seconds Monitor platelets by anticoagulation protocol: Yes   Plan:  Start IV heparin  at 1150 units/hr Check 8 hr aPTT and heparin  level Daily heparin  level, aPTT and CBC  Mariadel Mruk D. Lendell, PharmD, BCPS, BCCCP 12/24/2023, 8:58 PM

## 2023-12-24 NOTE — ED Triage Notes (Signed)
 Patient picked up by EMS. Patient complaining of extreme pain in post op left knee. Patient on scene had AFIB with RVR. Patient does have history of AFIB. Patient reports not eating and drinking because she does not feel well and has not had an appetite. Patient reports last bowel movement was a week ago.

## 2023-12-24 NOTE — ED Notes (Signed)
 ED Procedure Note  Critical Care  Performed by: Rosamond Lyndy Beagle, MD Authorized by: Rosamond Lyndy Beagle, MD   Critical care provider statement:    Critical care time (minutes):  30   Critical care was necessary to treat or prevent imminent or life-threatening deterioration of the following conditions:  Circulatory failure   Critical care was time spent personally by me on the following activities:  Ordering and review of laboratory studies, ordering and review of radiographic studies, pulse oximetry, re-evaluation of patient's condition, examination of patient, evaluation of patient's response to treatment, review of old charts and discussions with consultants

## 2023-12-24 NOTE — ED Notes (Signed)
Bed: 05  Expected date:   Expected time:   Means of arrival:   Comments:  EMS

## 2023-12-24 NOTE — H&P (Signed)
 Cardiology Admission History and Physical   Patient ID: [name] MRN: [id number]; DOB: October 25, 1940   Admission date: 12/24/2023  PCP:  Suanne Pfeiffer, NP   [facility] Providers Cardiologist:  Jayson Sierras, MD  Electrophysiologist:  OLE ONEIDA HOLTS, MD      Chief Complaint:  AMS, chest pain  Patient Profile: [name] is a 83 y.o. female with nonobstructive coronary artery disease, persistent A-fib status post ablation (2023) on Xarelto , HTN, asthma/COPD, RA, hypothyroidism who is being seen 12/24/2023 for the evaluation of A-fib with RVR, elevated troponins.  History of Present Illness: [name] has a history of nonobstructive coronary artery disease with left heart cath in May of this year demonstrating mid RCA lesion 60% stenosed, proximal circumflex 50% stenosis, persistent A-fib on rivaroxaban , COPD, RA who presents with altered mental status and chest pain found to be in A-fib with RVR at outside hospital.  Briefly, patient states that she has been having intermittent chest pain starting about a day or 2 ago.  She states that this pain is similar to her A-fib for which she is used to and does not feel different.  She underwent a left knee replacement on 826 for which her anticoagulation had been held.  She also notes some more subacute shortness of breath on exertion over the last couple of weeks.  Denies any recent infection, cough, productive sputum, orthopnea, PND, or worsening lower extremity edema prior to her surgery.  Is not have a history of heart failure, most recent echo in February with normal biventricular function and some diastolic dysfunction.  She states that her son was over this morning and noticed she was not acting right and so called EMS.  At that time was found to be in A-fib with rapid ventricular rate.  She states that she has had minimal bowel movements and has not peed in 1 to 2 days since her procedure, she has been on narcotics,  she denies abdominal pain, dysuria, nausea or vomiting.  Has not eaten much due to lack of appetite but she has some now.  Denies current chest pain.  In outside hospital ED she was noted to be in A-fib with RVR with lower blood pressures.  Placed on a dill drip with conversion to sinus rhythm with frequent PACs.  No EKG available.  Troponin elevated at [phone].  proBNP 8364.  Creatinine 1.2.  TSH normal.  CBC with leukocytosis and anemia at 13.1 and 8.9 respectively.  Normal platelets.  CT a negative for PE.  Past Medical History:  Diagnosis Date   Anemia    Arthritis    Breast cancer (HCC)    Right mastectomy   Chronic back pain    Coronary atherosclerosis of native coronary artery    a. 12/2019 Cath: LM nl, LAD 29m, LCX 50p, 80d, RCA 20p, 29m-->med rx.   Demand ischemia (HCC)    a. 02/2021 HsTrop to 361 in setting of rapid afib; b. 02/2021 Echo: EF 65-70%, no rwma, mild LVH, RVSP 28.52mmHg. Mildly to mod dil LA. Triv MR. Ao sclerosis w/o stenosis.   Dysrhythmia    A. Fib   Environmental allergies    Essential hypertension    GERD (gastroesophageal reflux disease)    History of bronchitis    History of colon polyps    History of diverticulosis    History of migraine    Panic attacks    Persistent atrial fibrillation (HCC) 02/2021   a. CHA2DS2VASc = 5-->xarelto .  Prolapse of vaginal vault after hysterectomy    Seasonal allergies    Urinary frequency    Past Surgical History:  Procedure Laterality Date   ABDOMINAL HYSTERECTOMY     ANTERIOR CERVICAL DECOMP/DISCECTOMY FUSION N/A 10/28/2023   Procedure: ANTERIOR CERVICAL DECOMPRESSION/DISCECTOMY FUSION 2 LEVELS;  Surgeon: Beuford Anes, MD;  Location: [facility] OR;  Service: Orthopedics;  Laterality: N/A;  ANTERIOR CERVICAL DECOMPRESSION AND FUSION CERVICAL 6- CERVICAL 7, CERVICAL 7- THORACIC 1 WITH INSTRUMENTATION AND ALLOGRAFT   ATRIAL FIBRILLATION ABLATION N/A 10/28/2021   Procedure: ATRIAL FIBRILLATION ABLATION;  Surgeon: Cindie Ole DASEN, MD;  Location: [facility] INVASIVE CV LAB;  Service: Cardiovascular;  Laterality: N/A;   BACK SURGERY  1981/2011/2014   BLADDER SUSPENSION     BREAST RECONSTRUCTION  2004   with abd tissue   BREAST SURGERY Right    CARDIOVERSION N/A 04/30/2021   Procedure: CARDIOVERSION;  Surgeon: Alvan Dorn FALCON, MD;  Location: [facility] ORS;  Service: Endoscopy;  Laterality: N/A;   CARDIOVERSION N/A 05/16/2021   Procedure: CARDIOVERSION;  Surgeon: Alvan Dorn FALCON, MD;  Location: [facility] ORS;  Service: Endoscopy;  Laterality: N/A;   CARDIOVERSION N/A 08/13/2021   Procedure: CARDIOVERSION;  Surgeon: Kate Lonni CROME, MD;  Location: [facility] ENDOSCOPY;  Service: Cardiovascular;  Laterality: N/A;   CHOLECYSTECTOMY     COLONOSCOPY  12/2009   RMR: few pancolonic diverticula. next TCS 12/2014   COLONOSCOPY  06/14/2007   MFM:Ipfpwlupcz rectal polyp, status post cold biopsy removal/ Anal canal hemorrhoids/Left-sided diverticula Colonic mucosa appeared normal.Hyperplastic polyp.    COLONOSCOPY  01/18/2004   MFM:Dphfnpi diverticula/Internal hemorrhoids.  Otherwise normal rectum   COLONOSCOPY N/A 04/18/2013   Dr. Shaaron- normal rectum, scattered left sided diverticula the remainder of the colonic mucosa appeared normal   COLONOSCOPY N/A 08/18/2014   Procedure: COLONOSCOPY;  Surgeon: Lamar CHRISTELLA Shaaron, MD;  Location: [facility] END[facility] SU[facility];  Service: Endoscopy;  Laterality: N/A;  1000   ESOPHAGOGASTRODUODENOSCOPY  06/14/2007   MFM:Mpaanw-opxz plaques in esophageal mucosa of uncertain significance brushed and biopsied/Normal stomach, normal first duodenum and second duodenoscopy. KOH negative. esophageal bx c/w GERD   ESOPHAGOGASTRODUODENOSCOPY N/A 08/18/2014   Procedure: ESOPHAGOGASTRODUODENOSCOPY (EGD);  Surgeon: Lamar CHRISTELLA Shaaron, MD;  Location: [facility];  Service: Endoscopy;  Laterality: N/A;   ESOPHAGOGASTRODUODENOSCOPY (EGD) WITH ESOPHAGEAL DILATION N/A 04/18/2013   Dr. Shaaron- normal, patent appearing, tubular esophagus,  normal gastric mucosa, paptent pylorus, normal first and second portion of the duodenum   EYE SURGERY     lasik   FOOT SURGERY Right 2014   d/t plantar fascitis   GIVENS CAPSULE STUDY N/A 07/18/2013   L.Lewis PAC- markedly abnormal appearing gastric mucosa, sugnificant bile reflux, questionable subucosal small bowel mass versus extrinsic compression in the distal small bowel.- CTE= no small bowel mass or tumor seen.   LEFT HEART CATH AND CORONARY ANGIOGRAPHY N/A 01/16/2020   Procedure: LEFT HEART CATH AND CORONARY ANGIOGRAPHY;  Surgeon: Verlin Lonni BIRCH, MD;  Location: [facility] INV[facility] CV LAB;  Service: Cardiovascular;  Laterality: N/A;   LEFT HEART CATH AND CORONARY ANGIOGRAPHY N/A 09/15/2023   Procedure: LEFT HEART CATH AND CORONARY ANGIOGRAPHY;  Surgeon: Anner Alm ORN, MD;  Location: [facility] INVASIVE CV LAB;  Service: Cardiovascular;  Laterality: N/A;   MALONEY DILATION N/A 08/18/2014   Procedure: AGAPITO DILATION;  Surgeon: Lamar CHRISTELLA Shaaron, MD;  Location: [facility];  Service: Endoscopy;  Laterality: N/A;   MASTECTOMY     right side 12-13 years ago   MOUTH SURGERY     cyst  removed in June 2022.   TOTAL HIP ARTHROPLASTY Left 02/29/2016   Procedure: TOTAL HIP ARTHROPLASTY ANTERIOR APPROACH;  Surgeon: Dempsey Sensor, MD;  Location: [facility] OR;  Service: Orthopedics;  Laterality: Left;   TOTAL HIP ARTHROPLASTY Right 01/05/2017   Procedure: TOTAL HIP ARTHROPLASTY ANTERIOR APPROACH;  Surgeon: Sensor Dempsey, MD;  Location: [facility] OR;  Service: Orthopedics;  Laterality: Right;  REQUEST 90 MINS   TOTAL KNEE ARTHROPLASTY Left 12/21/2023   Procedure: ARTHROPLASTY, KNEE, TOTAL;  Surgeon: Yvone Rush, MD;  Location: [facility] ORS;  Service: Orthopedics;  Laterality: Left;     Medications Prior to Admission: Prior to Admission medications   Medication Sig Start Date End Date Taking? Authorizing Provider  albuterol  (VENTOLIN  HFA) 108 (90 Base) MCG/ACT inhaler Inhale 1-2 puffs into the lungs every 6 (six) hours as  needed for shortness of breath or wheezing.    Debera Jayson MATSU, MD  Blood Pressure Monitor DEVI 1 each by Does not apply route daily. 06/23/23   Miriam Norris, NP  budesonide-formoterol [facility]) 160-4.5 MCG/ACT inhaler Inhale 2 puffs into the lungs daily as needed (respiratory issues.). 09/08/23   [provider]  diphenhydrAMINE  (BENADRYL ) 25 MG tablet Take 25 mg by mouth daily as needed for allergies.    [provider]  docusate sodium  (COLACE) 100 MG capsule Take 100 mg by mouth daily as needed for mild constipation.    [provider]  gabapentin  (NEURONTIN ) 300 MG capsule Take 300 mg by mouth daily. 09/22/23   [provider]  losartan  (COZAAR ) 50 MG tablet Take 1 tablet (50 mg total) by mouth daily. 11/27/22   Cindie Ole DASEN, MD  metoprolol  succinate (TOPROL  XL) 25 MG 24 hr tablet Take 1 tablet (25 mg total) by mouth daily. Take in addition to 50 mg in the AM Patient taking differently: Take 25 mg by mouth at bedtime. Take in addition to 50 mg in the AM 10/13/23   Strader, [place] M, PA-C  metoprolol  succinate (TOPROL -XL) 50 MG 24 hr tablet Take 1 tablet (50 mg total) by mouth in the morning and at bedtime. Take with or immediately following a meal. 06/23/23   Miriam Norris, NP  nitroGLYCERIN  (NITROSTAT ) 0.4 MG SL tablet Place 1 tablet (0.4 mg total) under the tongue every 5 (five) minutes as needed for chest pain. 08/06/23   Miriam Norris, NP  omeprazole  (PRILOSEC) 20 MG capsule Take 20 mg by mouth daily as needed (indigestion/heartburn).    [provider]  oxyCODONE -acetaminophen  (PERCOCET/ROXICET) 5-325 MG tablet Take 1 tablet by mouth every 4 (four) hours as needed for severe pain (pain score 7-10). 12/21/23   Rondall Agent, PA-C  Rivaroxaban  (XARELTO ) 15 MG TABS tablet Take 1 tablet (15 mg total) by mouth daily with supper. 11/17/23   Debera Jayson MATSU, MD  rosuvastatin  (CRESTOR ) 5 MG tablet Take 1 tablet (5 mg total) by mouth at  bedtime. 09/09/23 04/20/28  Debera Jayson MATSU, MD  tiZANidine  (ZANAFLEX ) 2 MG tablet Take 1 tablet (2 mg total) by mouth every 8 (eight) hours as needed for muscle spasms. 12/21/23   Rondall Agent, PA-C    Allergies:    Allergies  Allergen Reactions   Penicillins Anaphylaxis, Swelling and Other (See Comments)    Blisters   Eliquis  [Apixaban ] Hives   Social History:   Social History   Socioeconomic History   Marital status: Widowed    Spouse name: Elsie   Number of children: 4   Years of education: 12th   Highest education level:  Not on file  Occupational History    Employer: RETIRED  Tobacco Use   Smoking status: Former    Current packs/day: 0.00    Average packs/day: 1.5 packs/day for 30.0 years (45.0 ttl pk-yrs)    Types: Cigarettes    Start date: 04/28/1970    Quit date: 04/28/2000    Years since quitting: 23.6   Smokeless tobacco: Never   Tobacco comments:    Former smoker 07/30/21  Vaping Use   Vaping status: Never Used  Substance and Sexual Activity   Alcohol use: No   Drug use: No   Sexual activity: Not on file    Comment: hyst  Other Topics Concern   Not on file  Social History Narrative   Patient lives at home with spouse.   Caffeine Use: 2 cups of coffee daily   Social Drivers of Health   Financial Resource Strain: Low Risk  (12/03/2023)   Received from [facility]   Overall Financial Resource Strain (CARDIA)    How hard is it for you to pay for the very basics like food, housing, medical care, and heating?: Not hard at all  Food Insecurity: No Food Insecurity (12/21/2023)   Hunger Vital Sign    Worried About Running Out of Food in the Last Year: Never true    Ran Out of Food in the Last Year: Never true  Transportation Needs: No Transportation Needs (12/21/2023)   PRAPARE - Administrator, Civil Service (Medical): No    Lack of Transportation (Non-Medical): No  Physical Activity: Insufficiently Active (12/03/2023)   Received [facility]   Exercise Vital Sign    On average, how many days per week do you engage in moderate to strenuous exercise (like a brisk walk)?: 3 days    On average, how many minutes do you engage in exercise at this level?: 20 min  Stress: No Stress Concern Present (12/03/2023)   Received from [facility] - Occupational Stress Questionnaire    Do you feel stress - tense, restless, nervous, or anxious, or unable to sleep at night because your mind is troubled all the time - these days?: Not at all  Social Connections: Socially Integrated (12/21/2023)   Social Connection and Isolation Panel    Frequency of Communication with Friends and Family: Once a week    Frequency of Social Gatherings with Friends and Family: Twice a week    Attends Religious Services: 1 to 4 times per year    Active Member of [employer] or Organizations: No    Attends Banker Meetings: 1 to 4 times per year    Marital Status: Married  Catering manager Violence: Not At Risk (12/21/2023)   Humiliation, Afraid, Rape, and Kick questionnaire    Fear of Current or Ex-Partner: No    Emotionally Abused: No    Physically Abused: No    Sexually Abused: No    Family History:   The patient's family history includes Cancer in her brother and sister; Colon cancer in her brother; Coronary artery disease in her brother, father, and mother; Diabetes in her son; Emphysema in her brother; Healthy in her son, son, son, and son; Lung cancer in her sister.    ROS:  Please see the history of present illness.  All other ROS reviewed and negative.     Physical Exam/Data: Vitals:   12/24/23 1800 12/24/23 1856  BP:  (!) 134/47  Pulse:  63  Temp:  97.8 F (36.6 C)  TempSrc:  Oral  SpO2:  97%  Weight: 108.1 kg   Height: 5' 5 (1.651 m)    No intake or output data in the 24 hours ending 12/24/23 2109    12/24/2023    6:00 PM 12/21/2023    6:54 AM 12/15/2023    9:15 AM  Last 3 Weights   Weight (lbs) 238 lb 5.1 oz 150 lb 150 lb  Weight (kg) 108.1 kg 68.04 kg 68.04 kg     Body mass index is 39.66 kg/m.  General:  Well nourished, well developed, in no acute distress HEENT: normal Neck: no JVD Vascular: No carotid bruits; Distal pulses 2+ bilaterally   Cardiac:  normal S1, S2; irregular due to PACs; 2/6 holosystolic murmur at apex Lungs:  clear to auscultation bilaterally, no wheezing, rhonchi or rales  Abd: soft, nontender, no hepatomegaly  Ext: LLE edema post op, left knee swollen and warm, RLE normal Musculoskeletal:  No deformities, BUE and BLE strength normal and equal Skin: warm and dry  Neuro:  CNs 2-12 intact, no focal abnormalities noted Psych:  Normal affect   EKG:  The ECG that was pending   Relevant CV Studies: [facility] 08/2023:    Mid RCA lesion is 60% stenosed. -Mild progression of disease; Prox RCA lesion is 20% stenosed.   Mid LAD lesion is 20% stenosed.   Prox Cx lesion is 50% stenosed. LPAV lesion is 60% stenosed (stable upon improved flow)   LV end diastolic pressure is normal.   There is no aortic valve stenosis.   Dominance: Right  POST-OPERATIVE DIAGNOSIS:   ? Nonobstructive coronary disease-relatively stable compared to 2021 with exception of progression of disease in the mid to distal RCA from 20% to 50%, stable proximal LCx 50% stenosis with ostium of AV groove circumflex 60% (previously read as 80%). ? Normal LVEDP     RECOMMENDATIONS   In the absence of any other complications or medical issues, we expect the patient to be ready for discharge from a cath perspective on 09/15/2023.   Recommend Aspirin  81mg  daily for moderate CAD.   Lexiscan  07/2023:      Stress ECG is negative for ischemia and arrhythmias   LV perfusion is normal. There is no evidence of ischemia. There is no evidence of infarction.   Left ventricular function is normal. Nuclear stress EF: 64%.   Findings are consistent with no ischemia and no infarction. The study is low  risk.   Cardiac monitor 05/2023: ZIO monitor reviewed.  11 days, 2 hours analyzed.   1. Predominant rhythm is sinus with heart rate ranging from 45 bpm up to 114 bpm and average heart rate 66 bpm. 2. There were frequent PACs representing 6.8% total beats with otherwise occasional atrial couplets and triplets. 3. There were rare PVCs including ventricular couplets representing less than 1% total beats. 4. Multiple (2425) very brief episodes of PSVT were noted, the longest of which was only 17 beats.  There were no sustained arrhythmias.  Some of these episodes occurred with IVCD/aberrancy. 5. There were no pauses or high degree heart block.   Echocardiogram 05/2023: 1. Left ventricular ejection fraction, by estimation, is 60 to 65%. The  left ventricle has normal function. The left ventricle has no regional  wall motion abnormalities. There is mild left ventricular hypertrophy.  Left ventricular diastolic parameters  are consistent with Grade I diastolic dysfunction (impaired relaxation).  Elevated left ventricular end-diastolic pressure. The average  left  ventricular global longitudinal strain is -21.1 %. The global longitudinal  strain is normal.   2. Right ventricular systolic function is normal. The right ventricular  size is normal.   3. The mitral valve is normal in structure. Trivial mitral valve  regurgitation. No evidence of mitral stenosis.   4. The aortic valve is tricuspid. Aortic valve regurgitation is not  visualized. No aortic stenosis is present.   5. The inferior vena cava is normal in size with greater than 50%  respiratory variability, suggesting right atrial pressure of 3 mmHg.   6. Cannot exclude a small PFO.   Comparison(s): No significant change from prior study.    Laboratory Data: TSH 2.7 WBC 13.1, hemoglobin 8.9, platelets 258 NA 135, K3.6, creatinine 1.2, mag 1.7 High-sensitivity troponin [id number] proBNP 8364 Lactate 1.6  Radiology/Studies:  CTA  negative for acute pulmonary embolism, no focal airspace disease  Assessment and Plan: NSTEMI secondary to demand ischemia versus type I A-fib with RVR, now resolved Nonobstructive coronary artery disease Perplexing presentation given the significant elevation in her troponin levels.  Was called for altered mental status by her son.  She does note that she was having some chest pain but thought this was consistent with her A-fib for which she is symptomatic when she is in it.  Her recent cath showed nonobstructive disease, symptoms consistent with prior A-fib with RVR make me suspect this is more of demand related ischemia rather than type I acute coronary syndrome but her troponin level is significant.  She has been ruled out for PE, will heparinize more for anticoagulation in the setting of her A-fib; however, this leaves option open tomorrow for repeat left heart catheterization to see if there is been any progression of coronary artery disease particularly in the left circumflex system or RCA. - Heparin  per pharmacy for A-fib -Home Xarelto , last dose 8/27 PM - EKG - DC diltiazem  gtt - Restart home metoprolol  - Holding losartan  given normal blood pressure - Repeat lipids  4.  Altered mental status, resolved Unclear etiology, no focal neurologic deficits and she is alert and oriented x 4 currently.  Suspect may be med related in the setting of narcotics and antispasmodics from her surgery.  Will also need to have bowel movement and postvoid residual. - Continue bowel regimen - PVR, if retaining needs In-N-Out straight cath - Hold tizanidine   5.  Elevated proBNP Does not appear significantly volume overloaded based on JVD.  proBNP markedly elevated.  Some respiratory flow variation I think related to her chronic obstructive pulmonary disease.  No PE.  Previous TTE on 2/25 with normal biventricular function, mild LVH, grade 1 diastolic dysfunction.  Trivial MR.  Possibly related to her RVR but  she was having some subacute shortness of breath, possibly some progressed RV dysfunction secondary to lung disease? - No diuretics, repeat TTE  6.  Status post left TKA, postop pain and swelling She is able to move her left knee, lower suspicion for septic arthritis but given elevated white count and persistent pain would continue daily examinations.  Continue to follow daily exam and WBC.  Will not start antibiotics at this time.   Risk Assessment/Risk Scores:   TIMI Risk Score for Unstable Angina or Non-ST Elevation MI:   The patient's TIMI risk score is 5, which indicates a 26% risk of all cause mortality, new or recurrent myocardial infarction or need for urgent revascularization in the next 14 days.  New York  Heart Association (NYHA) Functional  Class NYHA Class II  CHA2DS2-VASc Score = 5   This indicates a 7.2% annual risk of stroke. The patient's score is based upon: CHF History: 0 HTN History: 1 Diabetes History: 0 Stroke History: 0 Vascular Disease History: 1 Age Score: 2 Gender Score: 1    Code Status: Do Not Resuscitate (DNR)  Severity of Illness: The appropriate patient status for this patient is INPATIENT. Inpatient status is judged to be reasonable and necessary in order to provide the required intensity of service to ensure the patient's safety. The patient's presenting symptoms, physical exam findings, and initial radiographic and laboratory data in the context of their chronic comorbidities is felt to place them at high risk for further clinical deterioration. Furthermore, it is not anticipated that the patient will be medically stable for discharge from the hospital within 2 midnights of admission.   * I certify that at the point of admission it is my clinical judgment that the patient will require inpatient hospital care spanning beyond 2 midnights from the point of admission due to high intensity of service, high risk for further deterioration and high frequency  of surveillance required.*  For questions or updates, please contact  [facility] Please consult [email or web address] for contact info under     Signed, Ozell Bushman, MD  12/24/2023 9:09 PM

## 2023-12-24 NOTE — Progress Notes (Signed)
 Patient is c/o lower abdominal pain and pressure with the urge to urinate but can not. retaining 727 mL according to bladder scan. Cosiano MD notified.

## 2023-12-25 ENCOUNTER — Inpatient Hospital Stay (HOSPITAL_COMMUNITY)

## 2023-12-25 ENCOUNTER — Encounter (HOSPITAL_COMMUNITY): Payer: Self-pay | Admitting: Student

## 2023-12-25 ENCOUNTER — Encounter (HOSPITAL_COMMUNITY)
Admission: RE | Disposition: A | Discharge status: Home / Self Care | Payer: Self-pay | Source: Other Acute Inpatient Hospital | Attending: Cardiology

## 2023-12-25 ENCOUNTER — Other Ambulatory Visit: Payer: Self-pay

## 2023-12-25 DIAGNOSIS — R9431 Abnormal electrocardiogram [ECG] [EKG]: Secondary | ICD-10-CM

## 2023-12-25 DIAGNOSIS — I214 Non-ST elevation (NSTEMI) myocardial infarction: Secondary | ICD-10-CM | POA: Diagnosis not present

## 2023-12-25 DIAGNOSIS — I2510 Atherosclerotic heart disease of native coronary artery without angina pectoris: Secondary | ICD-10-CM

## 2023-12-25 LAB — BASIC METABOLIC PANEL WITH GFR
Anion gap: 12 (ref 5–15)
Anion gap: 11 (ref 5–15)
BUN: 23 mg/dL (ref 8–23)
BUN: 21 mg/dL (ref 8–23)
CO2: 23 mmol/L (ref 22–32)
CO2: 24 mmol/L (ref 22–32)
Calcium: 8.6 mg/dL — ABNORMAL LOW (ref 8.9–10.3)
Calcium: 8.7 mg/dL — ABNORMAL LOW (ref 8.9–10.3)
Chloride: 104 mmol/L (ref 98–111)
Chloride: 102 mmol/L (ref 98–111)
Creatinine, Ser: 0.96 mg/dL (ref 0.44–1.00)
Creatinine, Ser: 0.89 mg/dL (ref 0.44–1.00)
GFR, Estimated: 59 mL/min — ABNORMAL LOW (ref >60)
GFR, Estimated: >60 mL/min (ref >60)
Glucose, Bld: 111 mg/dL — ABNORMAL HIGH (ref 70–99)
Glucose, Bld: 98 mg/dL (ref 70–99)
Potassium: 3.8 mmol/L (ref 3.5–5.1)
Potassium: 3.5 mmol/L (ref 3.5–5.1)
Sodium: 139 mmol/L (ref 135–145)
Sodium: 137 mmol/L (ref 135–145)

## 2023-12-25 LAB — HEPARIN LEVEL (UNFRACTIONATED)
Heparin Unfractionated: 0.62 [IU]/mL (ref 0.30–0.70)
Heparin Unfractionated: 0.71 [IU]/mL — ABNORMAL HIGH (ref 0.30–0.70)
Heparin Unfractionated: 0.64 [IU]/mL (ref 0.30–0.70)

## 2023-12-25 LAB — CBC
HCT: 24.8 % — ABNORMAL LOW (ref 36.0–46.0)
HCT: 26.3 % — ABNORMAL LOW (ref 36.0–46.0)
Hemoglobin: 8.1 g/dL — ABNORMAL LOW (ref 12.0–15.0)
Hemoglobin: 8.5 g/dL — ABNORMAL LOW (ref 12.0–15.0)
MCH: 31 pg (ref 26.0–34.0)
MCH: 30.9 pg (ref 26.0–34.0)
MCHC: 32.7 g/dL (ref 30.0–36.0)
MCHC: 32.3 g/dL (ref 30.0–36.0)
MCV: 95 fL (ref 80.0–100.0)
MCV: 95.6 fL (ref 80.0–100.0)
Platelets: 240 K/uL (ref 150–400)
Platelets: 241 K/uL (ref 150–400)
RBC: 2.61 MIL/uL — ABNORMAL LOW (ref 3.87–5.11)
RBC: 2.75 MIL/uL — ABNORMAL LOW (ref 3.87–5.11)
RDW: 13.2 % (ref 11.5–15.5)
RDW: 13.2 % (ref 11.5–15.5)
WBC: 10.3 K/uL (ref 4.0–10.5)
WBC: 10.1 K/uL (ref 4.0–10.5)
nRBC: 0.2 % (ref 0.0–0.2)
nRBC: 0 % (ref 0.0–0.2)

## 2023-12-25 LAB — ECHOCARDIOGRAM COMPLETE
Area-P 1/2: 4.46 cm2
Height: 65 in
S' Lateral: 2.85 cm
Weight: 3813.08 [oz_av]

## 2023-12-25 LAB — TROPONIN I (HIGH SENSITIVITY)
Troponin I (High Sensitivity): 4180 ng/L (ref <18)
Troponin I (High Sensitivity): 673 ng/L (ref <18)

## 2023-12-25 LAB — LIPID PANEL
Cholesterol: 79 mg/dL (ref 0–200)
HDL: 32 mg/dL — ABNORMAL LOW (ref >40)
LDL Cholesterol: 30 mg/dL (ref 0–99)
Total CHOL/HDL Ratio: 2.5 ratio
Triglycerides: 87 mg/dL (ref <150)
VLDL: 17 mg/dL (ref 0–40)

## 2023-12-25 LAB — APTT
aPTT: 51 s — ABNORMAL HIGH (ref 24–36)
aPTT: 35 s (ref 24–36)
aPTT: 76 s — ABNORMAL HIGH (ref 24–36)

## 2023-12-25 SURGERY — LEFT HEART CATH AND CORONARY ANGIOGRAPHY
Anesthesia: LOCAL

## 2023-12-25 MED ORDER — HYDROMORPHONE HCL 1 MG/ML IJ SOLN
0.5000 mg | Freq: Once | INTRAMUSCULAR | Status: AC
  Administered 2023-12-25: 0.5 mg via INTRAVENOUS
  Filled 2023-12-25: qty 1

## 2023-12-25 NOTE — Progress Notes (Addendum)
 Progress Note  Patient Name: [name] Date of Encounter: 12/25/2023 [facility] Cardiologist: Jayson Sierras, MD   Interval Summary   Patient reports feeling much better this morning No chest pain She is A &O x 4  Vital Signs Vitals:   12/24/23 2325 12/25/23 0425 12/25/23 0726 12/25/23 0930  BP: (!) 127/45 (!) 127/47 (!) 142/48 (!) 142/48  Pulse: 64 (!) 28 60 60  Resp: 16 16 17    Temp: 98.2 F (36.8 C) 98.1 F (36.7 C)    TempSrc: Oral Oral    SpO2: 97% 98% 99%   Weight:      Height:        Intake/Output Summary (Last 24 hours) at 12/25/2023 1018 Last data filed at 12/25/2023 0010 Gross per 24 hour  Intake --  Output 900 ml  Net -900 ml      12/24/2023    6:00 PM 12/21/2023    6:54 AM 12/15/2023    9:15 AM  Last 3 Weights  Weight (lbs) 238 lb 5.1 oz 150 lb 150 lb  Weight (kg) 108.1 kg 68.04 kg 68.04 kg     Telemetry/ECG  Sinus rhythm with frequent PACs, HR 60s- Personally Reviewed  Physical Exam  GEN: No acute distress.   Neck: No JVD Cardiac: RRR, frequent ectopy, no murmurs, rubs, or gallops.  Respiratory: Clear to auscultation bilaterally. GI: Soft, nontender, non-distended  MS: mild LE edema on left side, post-op knee  Assessment & Plan   NSTEMI  Nonobstructive CAD Reported chest pain, however due to AMS difficult to discern  LHC 08/2023 showed nonobstructive CAD  Troponin 4,141 ? 4,576 Currently receiving IV heparin  for A. Fib Continue ASA 81 mg daily Continue Crestor  5 mg daily  Continue Toprol  25 mg daily  Currently NPO while awaiting decision for cath, I will discuss with MD this morning  Persistent A. Fib with RVR s/p ablation 11/2021  Frequent PACs Longstanding history of A. Fib  Typically symptomatic when in RVR Currently in sinus with HR 60s, frequent PACs on telemetry  On Xarelto  at home, last dose 8/27  Currently on IV heparin   Continue Toprol  25 mg daily   Altered mental status, resolved Unclear cause  No focal  neuro deficits  Suspect in setting of narcotics and antispasmodics Continue bowel regimen  May require straight cath if urinary retention present   Elevated proBNP proBNP 8,364 CXR showed chronic lung disease Does not appear volume overloaded on exam  Echo 05/2023: normal BiV function, mild LVH, G1DD Pending repeat echo   S/p left total knee arthroplasty  Post-op pain, swelling Underwent surgery 8/25 Continue to monitor daily exam and CBC  Medical Readiness Date: 12/26/2023    For questions or updates, please contact [facility] Please consult [email or web address] for contact info under       Signed, Waddell DELENA Donath, PA-C   Patient seen and examined, note reviewed with the signed Advanced Practice Provider. I personally reviewed laboratory data, imaging studies and relevant notes. I independently examined the patient and formulated the important aspects of the plan. I have personally discussed the plan with the patient and/or family. Comments or changes to the note/plan are indicated below.  NSTEMI Nonobstructive CAD Persistent A-fib with RVR status post ablation Frequent PAC Elevated proBNP  NSTEMI recent heart catheterization back in May 2025 showed nonobstructive disease.  Troponin peaked at 4576 will repeat to follow trend.  Suspicion that this is type II occurring in the setting of her A-fib RVR.  Given she does have coronary artery disease I am going to continue her heparin  drip, she has no other symptoms suspicious of angina.  So this is ambiguous but what I would do is get an echocardiogram showed she have wall motion abnormality or troponin continues to trend up we will go ahead and move forward with a heart catheterization.  At this juncture she is not very keen on moving forward with heart catheterization she will prefer to avoid it if needed.  But as described to the patient that if there is wall motion abnormality the best will be to reassess her coronaries.  At  minimal she will need her heparin  drip for up to 48 to 72 hours.  In the meantime continue heparin  drip, continue Toprol -XL. Clinically she does not appear to be volume overloaded despite elevated proBNP.  Will monitor closely and reassess the need for use for diuretics.   Analya Louissaint DO, MS [facility] Attending Cardiologist [facility]  [address] [place], [place] [address] [phone] Website: [email or web address]

## 2023-12-25 NOTE — Progress Notes (Signed)
 IVT consult placed for lab draw per pharmacy. Per consult note, phlebotomy has tried to obtain labs x3 without success. Secure chat sent to pharmacist explaining that PIV placement isn't appropriate for lab draw. Patient has 2 PIV's documented. Consult completed - no action required by IVT at this time.   MD may need to consider alternative access if regular lab work is needed and options are limited peripherally with phlebotomy.   Shakti Fleer R Faizon Capozzi, RN

## 2023-12-25 NOTE — Progress Notes (Signed)
 PHARMACY - ANTICOAGULATION CONSULT NOTE  Pharmacy Consult:  Heparin  Indication: atrial fibrillation  Allergies  Allergen Reactions   Penicillins Anaphylaxis, Swelling and Rash    Blistering rash   Eliquis  [Apixaban ] Hives    Patient Measurements: Height: 5' 5 (165.1 cm) Weight: 108.1 kg (238 lb 5.1 oz) IBW/kg (Calculated) : 57 HEPARIN  DW (KG): 82.3  Vital Signs: Temp: 98.1 F (36.7 C) (08/29 0425) Temp Source: Oral (08/29 0425) BP: 142/48 (08/29 0930) Pulse Rate: 60 (08/29 0930)  Labs: Recent Labs    12/25/23 0951  HGB 8.1*  HCT 24.8*  PLT 240  APTT 51*  HEPARINUNFRC 0.62  CREATININE 0.96    Estimated Creatinine Clearance: 54.3 mL/min (by C-G formula based on SCr of 0.96 mg/dL).   Medical History: Past Medical History:  Diagnosis Date   Anemia    Arthritis    Breast cancer (HCC)    Right mastectomy   Chronic back pain    Coronary atherosclerosis of native coronary artery    a. 12/2019 Cath: LM nl, LAD 42m, LCX 50p, 80d, RCA 20p, 70m-->med rx.   Demand ischemia (HCC)    a. 02/2021 HsTrop to 361 in setting of rapid afib; b. 02/2021 Echo: EF 65-70%, no rwma, mild LVH, RVSP 28.3mmHg. Mildly to mod dil LA. Triv MR. Ao sclerosis w/o stenosis.   Dysrhythmia    A. Fib   Environmental allergies    Essential hypertension    GERD (gastroesophageal reflux disease)    History of bronchitis    History of colon polyps    History of diverticulosis    History of migraine    Panic attacks    Persistent atrial fibrillation (HCC) 02/2021   a. CHA2DS2VASc = 5-->xarelto .   Prolapse of vaginal vault after hysterectomy    Seasonal allergies    Urinary frequency     Assessment: 72 YOF admitted with Afib RVR and Pharmacy consulted to dose IV heparin .  Patient was on Xarelto  PTA and last took med on 8/27 around 1900.  CBC stable from previous admission.  No bleeding per patient.  Initial aPTT 51 sec subtherapeutic on 1150 units/hr.  Heparin  level 0.62 (recent DOAC).  Hgb  11 > 8.1, no overt bleeding noted.   Cardiology continuing heparin  gtt pending Echo for possible LHC (vs ~48h for med management)  Goal of Therapy:  Heparin  level 0.3-0.7 units/ml aPTT 66-102 seconds Monitor platelets by anticoagulation protocol: Yes   Plan:  Increase heparin  IV to 1250 units/hr 8h aPTT and heparin  level Daily heparin  level, aPTT (until correlating) and CBC F/u echo, cath plans  Maurilio Fila, PharmD Clinical Pharmacist 12/25/2023  11:22 AM

## 2023-12-25 NOTE — Plan of Care (Signed)

## 2023-12-25 NOTE — Progress Notes (Signed)
 PHARMACY - ANTICOAGULATION CONSULT NOTE  Pharmacy Consult:  Heparin  Indication: atrial fibrillation  Allergies  Allergen Reactions   Penicillins Anaphylaxis, Swelling and Rash    Blistering rash   Eliquis  [Apixaban ] Hives    Patient Measurements: Height: 5' 5 (165.1 cm) Weight: 108.1 kg (238 lb 5.1 oz) IBW/kg (Calculated) : 57 HEPARIN  DW (KG): 82.3  Vital Signs: Temp: 97.6 F (36.4 C) (08/29 2024) Temp Source: Oral (08/29 2024) BP: 131/44 (08/29 2024) Pulse Rate: 62 (08/29 2024)  Labs: Recent Labs    12/25/23 0951 12/25/23 1030 12/25/23 1055 12/25/23 1244 12/25/23 1527 12/25/23 2108  HGB 8.1*  --   --  8.5*  --   --   HCT 24.8*  --   --  26.3*  --   --   PLT 240  --   --  241  --   --   APTT 51* 35  --   --   --  76*  HEPARINUNFRC 0.62  --  0.71*  --   --  0.64  CREATININE 0.96  --   --  0.89  --   --   TROPONINIHS  --   --   --  4,180* 673*  --     Estimated Creatinine Clearance: 58.5 mL/min (by C-G formula based on SCr of 0.89 mg/dL).   Assessment: 53 YOF admitted with Afib RVR and Pharmacy consulted to dose IV heparin .  Patient was on Xarelto  PTA and last took med on 8/27 around 1900.  CBC stable from previous admission.  No bleeding per patient.  Heparin  level 0.64 (recent DOAC use), aPTT 76 sec (therapeutic) on infusion at 1250 units/hr. Levels not quite correlating yet.  Goal of Therapy:  Heparin  level 0.3-0.7 units/ml aPTT 66-102 seconds Monitor platelets by anticoagulation protocol: Yes   Plan:  Continue heparin  IV at 1250 units/hr Will f/u a.m. heparin  level and aPTT to confirm  Vito Ralph, PharmD, BCPS Please see amion for complete clinical pharmacist phone list 12/25/2023  10:32 PM

## 2023-12-25 NOTE — Progress Notes (Signed)
   Echocardiogram completed preliminarily reviewed by MD, who notes normal EF and no obvious wall motion abnormalities.  With this we will cancel cardiac catheterization for today.  Proceed to continue with IV heparin  for 48 to 72 hours.  Will recheck a troponin level today.  Diet orders placed.   Waddell DELENA Donath, PA-C 12/25/2023 12:23 PM

## 2023-12-26 LAB — BASIC METABOLIC PANEL WITH GFR
Anion gap: 12 (ref 5–15)
BUN: 20 mg/dL (ref 8–23)
CO2: 21 mmol/L — ABNORMAL LOW (ref 22–32)
Calcium: 8.8 mg/dL — ABNORMAL LOW (ref 8.9–10.3)
Chloride: 102 mmol/L (ref 98–111)
Creatinine, Ser: 0.9 mg/dL (ref 0.44–1.00)
GFR, Estimated: >60 mL/min (ref >60)
Glucose, Bld: 151 mg/dL — ABNORMAL HIGH (ref 70–99)
Potassium: 3.4 mmol/L — ABNORMAL LOW (ref 3.5–5.1)
Sodium: 135 mmol/L (ref 135–145)

## 2023-12-26 LAB — CBC
HCT: 26.9 % — ABNORMAL LOW (ref 36.0–46.0)
Hemoglobin: 8.9 g/dL — ABNORMAL LOW (ref 12.0–15.0)
MCH: 31.4 pg (ref 26.0–34.0)
MCHC: 33.1 g/dL (ref 30.0–36.0)
MCV: 95.1 fL (ref 80.0–100.0)
Platelets: 291 K/uL (ref 150–400)
RBC: 2.83 MIL/uL — ABNORMAL LOW (ref 3.87–5.11)
RDW: 13.2 % (ref 11.5–15.5)
WBC: 10.6 K/uL — ABNORMAL HIGH (ref 4.0–10.5)
nRBC: 0 % (ref 0.0–0.2)

## 2023-12-26 LAB — APTT
aPTT: 86 s — ABNORMAL HIGH (ref 24–36)
aPTT: 88 s — ABNORMAL HIGH (ref 24–36)

## 2023-12-26 LAB — HEPARIN LEVEL (UNFRACTIONATED): Heparin Unfractionated: 0.97 [IU]/mL — ABNORMAL HIGH (ref 0.30–0.70)

## 2023-12-26 MED ORDER — AMIODARONE LOAD VIA INFUSION
150.0000 mg | Freq: Once | INTRAVENOUS | Status: AC
  Administered 2023-12-26: 150 mg via INTRAVENOUS
  Filled 2023-12-26: qty 83.34

## 2023-12-26 MED ORDER — POTASSIUM CHLORIDE CRYS ER 20 MEQ PO TBCR
40.0000 meq | EXTENDED_RELEASE_TABLET | Freq: Once | ORAL | Status: AC
  Administered 2023-12-26: 40 meq via ORAL
  Filled 2023-12-26: qty 2

## 2023-12-26 MED ORDER — AMIODARONE HCL IN DEXTROSE 360-4.14 MG/200ML-% IV SOLN
60.0000 mg/h | INTRAVENOUS | Status: DC
  Administered 2023-12-26 (×2): 60 mg/h via INTRAVENOUS
  Filled 2023-12-26: qty 200

## 2023-12-26 MED ORDER — AMIODARONE HCL IN DEXTROSE 360-4.14 MG/200ML-% IV SOLN
30.0000 mg/h | INTRAVENOUS | Status: DC
  Administered 2023-12-27: 30 mg/h via INTRAVENOUS
  Filled 2023-12-26 (×2): qty 200

## 2023-12-26 NOTE — Progress Notes (Addendum)
 PHARMACY - ANTICOAGULATION CONSULT NOTE  Pharmacy Consult:  Heparin  Indication: atrial fibrillation  Allergies  Allergen Reactions   Penicillins Anaphylaxis, Swelling and Rash    Blistering rash   Eliquis  [Apixaban ] Hives    Patient Measurements: Height: 5' 5 (165.1 cm) Weight: 108.1 kg (238 lb 5.1 oz) IBW/kg (Calculated) : 57 HEPARIN  DW (KG): 82.3  Vital Signs: Temp: 97.9 F (36.6 C) (08/30 0630) Temp Source: Oral (08/30 0630) BP: 137/111 (08/30 0630) Pulse Rate: 125 (08/30 0630)  Labs: Recent Labs    12/25/23 0951 12/25/23 1030 12/25/23 1055 12/25/23 1244 12/25/23 1527 12/25/23 2108 12/26/23 0421  HGB 8.1*  --   --  8.5*  --   --  8.9*  HCT 24.8*  --   --  26.3*  --   --  26.9*  PLT 240  --   --  241  --   --  291  APTT 51* 35  --   --   --  76* 86*  HEPARINUNFRC 0.62  --  0.71*  --   --  0.64 0.97*  CREATININE 0.96  --   --  0.89  --   --  0.90  TROPONINIHS  --   --   --  4,180* 673*  --   --     Estimated Creatinine Clearance: 57.9 mL/min (by C-G formula based on SCr of 0.9 mg/dL).   Medical History: Past Medical History:  Diagnosis Date   Anemia    Arthritis    Breast cancer (HCC)    Right mastectomy   Chronic back pain    Coronary atherosclerosis of native coronary artery    a. 12/2019 Cath: LM nl, LAD 18m, LCX 50p, 80d, RCA 20p, 68m-->med rx.   Demand ischemia (HCC)    a. 02/2021 HsTrop to 361 in setting of rapid afib; b. 02/2021 Echo: EF 65-70%, no rwma, mild LVH, RVSP 28.37mmHg. Mildly to mod dil LA. Triv MR. Ao sclerosis w/o stenosis.   Dysrhythmia    A. Fib   Environmental allergies    Essential hypertension    GERD (gastroesophageal reflux disease)    History of bronchitis    History of colon polyps    History of diverticulosis    History of migraine    Panic attacks    Persistent atrial fibrillation (HCC) 02/2021   a. CHA2DS2VASc = 5-->xarelto .   Prolapse of vaginal vault after hysterectomy    Seasonal allergies    Urinary frequency      Assessment: 1 YOF admitted with Afib RVR and Pharmacy consulted to dose IV heparin .  Patient was on Xarelto  PTA and last took med on 8/27 around 1900. CBC stable: Hgb 8.9, PLT 291.   Today, aPTT remains therapeutic at 86, heparin  level has yet to correlate (supratherapeutic: 0.97). Will continue to trend until they correlate.  Goal of Therapy:  Heparin  level 0.3-0.7 units/ml aPTT 66-102 seconds Monitor platelets by anticoagulation protocol: Yes   Plan:  Continue IV heparin  at 1250 units/hr Check 8 hr aPTT and heparin  level Daily heparin  level, aPTT and CBC  Elma Fail, PharmD PGY1 Clinical Pharmacist [facility]  12/26/2023 7:15 AM

## 2023-12-27 ENCOUNTER — Inpatient Hospital Stay (HOSPITAL_COMMUNITY)

## 2023-12-27 DIAGNOSIS — I480 Paroxysmal atrial fibrillation: Secondary | ICD-10-CM | POA: Diagnosis not present

## 2023-12-27 DIAGNOSIS — I214 Non-ST elevation (NSTEMI) myocardial infarction: Secondary | ICD-10-CM | POA: Diagnosis not present

## 2023-12-27 LAB — CBC
HCT: 26.9 % — ABNORMAL LOW (ref 36.0–46.0)
Hemoglobin: 8.8 g/dL — ABNORMAL LOW (ref 12.0–15.0)
MCH: 31.2 pg (ref 26.0–34.0)
MCHC: 32.7 g/dL (ref 30.0–36.0)
MCV: 95.4 fL (ref 80.0–100.0)
Platelets: 280 K/uL (ref 150–400)
RBC: 2.82 MIL/uL — ABNORMAL LOW (ref 3.87–5.11)
RDW: 13.4 % (ref 11.5–15.5)
WBC: 10 K/uL (ref 4.0–10.5)
nRBC: 0 % (ref 0.0–0.2)

## 2023-12-27 LAB — BASIC METABOLIC PANEL WITH GFR
Anion gap: 10 (ref 5–15)
BUN: 13 mg/dL (ref 8–23)
CO2: 22 mmol/L (ref 22–32)
Calcium: 9 mg/dL (ref 8.9–10.3)
Chloride: 105 mmol/L (ref 98–111)
Creatinine, Ser: 0.98 mg/dL (ref 0.44–1.00)
GFR, Estimated: 57 mL/min — ABNORMAL LOW (ref >60)
Glucose, Bld: 123 mg/dL — ABNORMAL HIGH (ref 70–99)
Potassium: 3.9 mmol/L (ref 3.5–5.1)
Sodium: 137 mmol/L (ref 135–145)

## 2023-12-27 LAB — HEPARIN LEVEL (UNFRACTIONATED): Heparin Unfractionated: 0.37 [IU]/mL (ref 0.30–0.70)

## 2023-12-27 LAB — APTT: aPTT: 101 s — ABNORMAL HIGH (ref 24–36)

## 2023-12-27 MED ORDER — AMIODARONE HCL 200 MG PO TABS
200.0000 mg | ORAL_TABLET | Freq: Every day | ORAL | Status: DC
  Administered 2023-12-27: 200 mg via ORAL
  Filled 2023-12-27: qty 1

## 2023-12-27 MED ORDER — HYDRALAZINE HCL 20 MG/ML IJ SOLN
10.0000 mg | Freq: Once | INTRAMUSCULAR | Status: AC
  Administered 2023-12-27: 10 mg via INTRAVENOUS
  Filled 2023-12-27: qty 1

## 2023-12-27 MED ORDER — RIVAROXABAN 15 MG PO TABS
15.0000 mg | ORAL_TABLET | Freq: Every day | ORAL | Status: DC
  Administered 2023-12-27: 15 mg via ORAL
  Filled 2023-12-27: qty 1

## 2023-12-27 MED ORDER — LOSARTAN POTASSIUM 50 MG PO TABS
50.0000 mg | ORAL_TABLET | Freq: Every day | ORAL | Status: DC
  Administered 2023-12-27: 50 mg via ORAL
  Filled 2023-12-27: qty 1

## 2023-12-27 MED ORDER — SENNOSIDES-DOCUSATE SODIUM 8.6-50 MG PO TABS
2.0000 | ORAL_TABLET | Freq: Two times a day (BID) | ORAL | Status: DC
  Administered 2023-12-27 – 2023-12-28 (×3): 2 via ORAL
  Filled 2023-12-27 (×3): qty 2

## 2023-12-27 MED ORDER — FUROSEMIDE 10 MG/ML IJ SOLN
40.0000 mg | Freq: Once | INTRAMUSCULAR | Status: AC
  Administered 2023-12-27: 40 mg via INTRAVENOUS
  Filled 2023-12-27: qty 4

## 2023-12-27 NOTE — Progress Notes (Signed)
 Mobility Specialist Progress Note;   12/27/23 1510  Mobility  Activity Ambulated with assistance (in room)  Level of Assistance Contact guard assist, steadying assist  Assistive Device Front wheel walker  Distance Ambulated (ft) 15 ft  Activity Response Tolerated fair  Mobility Referral Yes  Mobility visit 1 Mobility  Mobility Specialist Start Time (ACUTE ONLY) 1510  Mobility Specialist Stop Time (ACUTE ONLY) 1527  Mobility Specialist Time Calculation (min) (ACUTE ONLY) 17 min   Pt in chair upon arrival, agreeable to mobility. Required MinG assistance to safely ambulate in room. Distance limited d/t L knee pain, RN notified about pain meds. VSS throughout. Pt returned back to chair and left with all needs met, alarm on.   Lauraine Erm Mobility Specialist Please contact via SecureChat or [employer] [phone]

## 2023-12-27 NOTE — Progress Notes (Signed)
 Progress Note  Patient Name: [name] Date of Encounter: 12/27/2023  Primary Cardiologist: Jayson Sierras, MD   Subjective   Patient seen and examined at her bedside.  New shortness of breath.  Inpatient Medications    Scheduled Meds:  amiodarone   200 mg Oral Daily   aspirin  EC  81 mg Oral Daily   fluticasone  furoate-vilanterol  1 puff Inhalation Daily   furosemide   40 mg Intravenous Once   gabapentin   300 mg Oral Daily   losartan   50 mg Oral Daily   metoprolol  succinate  25 mg Oral Daily   pantoprazole   40 mg Oral Daily   polyethylene glycol  17 g Oral Daily   rosuvastatin   5 mg Oral QHS   Continuous Infusions:  heparin  1,150 Units/hr (12/27/23 0924)   PRN Meds: acetaminophen , albuterol , docusate sodium , nitroGLYCERIN , ondansetron  (ZOFRAN ) IV, oxyCODONE -acetaminophen    Vital Signs    Vitals:   12/27/23 0410 12/27/23 0451 12/27/23 0656 12/27/23 0823  BP: (!) 196/67 (!) 198/66 (!) 182/62 (!) 182/62  Pulse: 82   82  Resp: 16 15 10 18   Temp: 98.3 F (36.8 C)   97.9 F (36.6 C)  TempSrc: Oral   Oral  SpO2: 99%   99%  Weight:      Height:        Intake/Output Summary (Last 24 hours) at 12/27/2023 1102 Last data filed at 12/27/2023 0400 Gross per 24 hour  Intake 792.6 ml  Output --  Net 792.6 ml   Filed Weights   12/24/23 1800  Weight: 108.1 kg    Telemetry    Sinus rhythm- Personally Reviewed  ECG     - Personally Reviewed  Physical Exam     General: Comfortable, sitting up in a chair Head: Atraumatic, normal size  Eyes: PEERLA, EOMI  Neck: Supple, normal JVD Cardiac: Normal S1, S2; RRR; no murmurs, rubs, or gallops Lungs: Clear to auscultation bilaterally Abd: Soft, nontender, no hepatomegaly  Ext: warm, no edema Musculoskeletal: No deformities, BUE and BLE strength normal and equal Skin: Warm and dry, no rashes   Neuro: Alert and oriented to person, place, time, and situation, CNII-XII grossly intact, no focal deficits  Psych:  Normal mood and affect   Labs    Chemistry Recent Labs  Lab 12/25/23 1244 12/26/23 0421 12/27/23 0448  NA 137 135 137  K 3.5 3.4* 3.9  CL 102 102 105  CO2 24 21* 22  GLUCOSE 98 151* 123*  BUN 21 20 13   CREATININE 0.89 0.90 0.98  CALCIUM  8.7* 8.8* 9.0  GFRNONAA >60 >60 57*  ANIONGAP 11 12 10      Hematology Recent Labs  Lab 12/25/23 1244 12/26/23 0421 12/27/23 0448  WBC 10.1 10.6* 10.0  RBC 2.75* 2.83* 2.82*  HGB 8.5* 8.9* 8.8*  HCT 26.3* 26.9* 26.9*  MCV 95.6 95.1 95.4  MCH 30.9 31.4 31.2  MCHC 32.3 33.1 32.7  RDW 13.2 13.2 13.4  PLT 241 291 280    Cardiac EnzymesNo results for input(s): TROPONINI in the last 168 hours. No results for input(s): TROPIPOC in the last 168 hours.   BNPNo results for input(s): BNP, PROBNP in the last 168 hours.   DDimer No results for input(s): DDIMER in the last 168 hours.   Radiology    ECHOCARDIOGRAM COMPLETE Result Date: 12/25/2023    ECHOCARDIOGRAM REPORT   Patient Name:   NIVEA WOJDYLA Date of Exam: 12/25/2023 Medical Rec #:  [id number]      Height:  65.0 in Accession #:    [id number]     Weight:       238.3 lb Date of Birth:  [birth year]     BSA:          2.131 m Patient Age:    83 years       BP:           142/48 mmHg Patient Gender: F              HR:           57 bpm. Exam Location:  Inpatient Procedure: 2D Echo, Color Doppler and Cardiac Doppler (Both Spectral and Color            Flow Doppler were utilized during procedure). Indications:    Abn ECG R94.31  History:        Patient has prior history of Echocardiogram examinations, most                 recent 10/31/2021.  Sonographer:    Tinnie Gosling RDCS Referring Phys: [id number] MICHAEL COSIANO IMPRESSIONS  1. Left ventricular ejection fraction, by estimation, is 60 to 65%. Left ventricular ejection fraction by PLAX is 63 %. The left ventricle has normal function. The left ventricle has no regional wall motion abnormalities. There is moderate left ventricular  hypertrophy. Left ventricular diastolic parameters are consistent with Grade II diastolic dysfunction (pseudonormalization). Elevated left ventricular end-diastolic pressure. The E/e' is 17.  2. Right ventricular systolic function is low normal. The right ventricular size is normal. Tricuspid regurgitation signal is inadequate for assessing PA pressure.  3. Left atrial size was mildly dilated.  4. The mitral valve is degenerative. Mild mitral valve regurgitation.  5. The aortic valve is tricuspid. Aortic valve regurgitation is trivial. Aortic valve sclerosis/calcification is present, without any evidence of aortic stenosis.  6. The inferior vena cava is normal in size with <50% respiratory variability, suggesting right atrial pressure of 8 mmHg. Comparison(s): Changes from prior study are noted. 10/31/2021: LVEF 60-65%. FINDINGS  Left Ventricle: Left ventricular ejection fraction, by estimation, is 60 to 65%. Left ventricular ejection fraction by PLAX is 63 %. The left ventricle has normal function. The left ventricle has no regional wall motion abnormalities. The left ventricular internal cavity size was normal in size. There is moderate left ventricular hypertrophy. Left ventricular diastolic parameters are consistent with Grade II diastolic dysfunction (pseudonormalization). Elevated left ventricular end-diastolic pressure. The E/e' is 29. Right Ventricle: The right ventricular size is normal. No increase in right ventricular wall thickness. Right ventricular systolic function is low normal. Tricuspid regurgitation signal is inadequate for assessing PA pressure. Left Atrium: Left atrial size was mildly dilated. Right Atrium: Right atrial size was normal in size. Pericardium: There is no evidence of pericardial effusion. Mitral Valve: The mitral valve is degenerative in appearance. Mild to moderate mitral annular calcification. Mild mitral valve regurgitation. Tricuspid Valve: The tricuspid valve is grossly normal.  Tricuspid valve regurgitation is trivial. Aortic Valve: The aortic valve is tricuspid. Aortic valve regurgitation is trivial. Aortic valve sclerosis/calcification is present, without any evidence of aortic stenosis. Pulmonic Valve: The pulmonic valve was grossly normal. Pulmonic valve regurgitation is trivial. Aorta: The aortic root and ascending aorta are structurally normal, with no evidence of dilitation. Venous: The inferior vena cava is normal in size with less than 50% respiratory variability, suggesting right atrial pressure of 8 mmHg. IAS/Shunts: No atrial level shunt detected by color flow Doppler.  LEFT VENTRICLE PLAX 2D LV  EF:         Left            Diastology                ventricular     LV e' medial:    6.74 cm/s                ejection        LV E/e' medial:  19.3                fraction by     LV e' lateral:   9.03 cm/s                PLAX is 63      LV E/e' lateral: 14.4                %. LVIDd:         4.33 cm LVIDs:         2.85 cm LV PW:         1.42 cm LV IVS:        1.23 cm LVOT diam:     1.79 cm LV SV:         55 LV SV Index:   26 LVOT Area:     2.52 cm  RIGHT VENTRICLE         IVC TAPSE (M-mode): 1.8 cm  IVC diam: 1.68 cm LEFT ATRIUM           Index LA diam:      3.61 cm 1.69 cm/m LA Vol (A4C): 31.3 ml 14.69 ml/m  AORTIC VALVE LVOT Vmax:   91.00 cm/s LVOT Vmean:  63.900 cm/s LVOT VTI:    0.217 m  AORTA Ao Root diam: 2.65 cm Ao Asc diam:  2.99 cm MITRAL VALVE MV Area (PHT): 4.46 cm     SHUNTS MV Decel Time: 170 msec     Systemic VTI:  0.22 m MV E velocity: 130.00 cm/s  Systemic Diam: 1.79 cm MV A velocity: 73.40 cm/s MV E/A ratio:  1.77 Vinie Maxcy MD Electronically signed by Vinie Maxcy MD Signature Date/Time: 12/25/2023/2:38:43 PM    Final     Cardiac Studies   Echocardiogram  Patient Profile     83 y.o. female with nonobstructive CAD, persistent atrial fibrillation status post ablation in 2023, status post total knee arthroplasty presented with elevated troponin in A-fib  RVR Assessment & Plan    Dyspnea on exertion Paroxysmal atrial fibrillation now in sinus rhythm Frequent PACs NSTEMI Nonobstructive CAD Status post total knee arthroplasty   Will convert her amiodarone  to p.o. 200 mg twice daily for the next 7 days and then 200 mg daily.  She is also short of breath which is new.  Not requiring oxygen.  Going to give the patient one-time dose of Lasix  40 mg daily.  Get a chest x-ray as well.  Blood pressure is elevated resume her home losartan  50 mg daily.  And continue her metoprolol  succinate. Echocardiogram did not show any evidence of wall motion abnormalities therefore there were no need to pursue left heart catheterization at this time.  Plan to discontinue heparin  drip after 48 hours.  CODE STATUS: DNR limited DVT prophylaxis currently on heparin  drip  For questions or updates, please contact [facility] Please consult [email or web address] for contact info under Cardiology/STEMI.      Signed, Merion Caton, DO  12/27/2023, 11:02 AM

## 2023-12-27 NOTE — Progress Notes (Signed)
 PHARMACY - ANTICOAGULATION CONSULT NOTE  Pharmacy Consult:  Heparin  Indication: atrial fibrillation  Allergies  Allergen Reactions   Penicillins Anaphylaxis, Swelling and Rash    Blistering rash   Eliquis  [Apixaban ] Hives    Patient Measurements: Height: 5' 5 (165.1 cm) Weight: 108.1 kg (238 lb 5.1 oz) IBW/kg (Calculated) : 57 HEPARIN  DW (KG): 82.3  Vital Signs: Temp: 98.3 F (36.8 C) (08/31 0410) Temp Source: Oral (08/31 0410) BP: 182/62 (08/31 0823) Pulse Rate: 82 (08/31 0823)  Labs: Recent Labs    12/25/23 1055 12/25/23 1244 12/25/23 1527 12/25/23 2108 12/26/23 0421 12/26/23 1456 12/27/23 0448  HGB  --  8.5*  --   --  8.9*  --  8.8*  HCT  --  26.3*  --   --  26.9*  --  26.9*  PLT  --  241  --   --  291  --  280  APTT  --   --   --  76* 86* 88* 101*  HEPARINUNFRC 0.71*  --   --  0.64 0.97*  --   --   CREATININE  --  0.89  --   --  0.90  --  0.98  TROPONINIHS  --  4,180* 673*  --   --   --   --     Estimated Creatinine Clearance: 53.1 mL/min (by C-G formula based on SCr of 0.98 mg/dL).   Medical History: Past Medical History:  Diagnosis Date   Anemia    Arthritis    Breast cancer (HCC)    Right mastectomy   Chronic back pain    Coronary atherosclerosis of native coronary artery    a. 12/2019 Cath: LM nl, LAD 23m, LCX 50p, 80d, RCA 20p, 55m-->med rx.   Demand ischemia (HCC)    a. 02/2021 HsTrop to 361 in setting of rapid afib; b. 02/2021 Echo: EF 65-70%, no rwma, mild LVH, RVSP 28.66mmHg. Mildly to mod dil LA. Triv MR. Ao sclerosis w/o stenosis.   Dysrhythmia    A. Fib   Environmental allergies    Essential hypertension    GERD (gastroesophageal reflux disease)    History of bronchitis    History of colon polyps    History of diverticulosis    History of migraine    Panic attacks    Persistent atrial fibrillation (HCC) 02/2021   a. CHA2DS2VASc = 5-->xarelto .   Prolapse of vaginal vault after hysterectomy    Seasonal allergies    Urinary  frequency     Assessment: 21 YOF admitted with Afib RVR and Pharmacy consulted to dose IV heparin .  Patient was on Xarelto  PTA and last took med on 8/27 around 1900. CBC stable: Hgb 8.9, PLT 291.   Today, aPTT remains therapeutic at higher end 101. Will make slight decrease to heparin  to avoid further increases into the supratherapeutic range since her level continues to increase with no changes to dose.No interruptions to the infusion or signs/symptoms of bleeding per RN. CBC stable. Heparin  level has yet to correlate (supratherapeutic: 0.97). Will trend daily until they correlate.  Goal of Therapy:  Heparin  level 0.3-0.7 units/ml aPTT 66-102 seconds Monitor platelets by anticoagulation protocol: Yes   Plan:  Decrease IV heparin  to 1150 units/hr Check 8 hr aPTT  Daily heparin  level, aPTT and CBC  Elma Fail, PharmD PGY1 Clinical Pharmacist [facility]  12/27/2023 8:42 AM

## 2023-12-28 DIAGNOSIS — I471 Supraventricular tachycardia, unspecified: Secondary | ICD-10-CM | POA: Diagnosis not present

## 2023-12-28 DIAGNOSIS — I214 Non-ST elevation (NSTEMI) myocardial infarction: Secondary | ICD-10-CM | POA: Diagnosis not present

## 2023-12-28 LAB — COMPREHENSIVE METABOLIC PANEL WITH GFR
ALT: 15 U/L (ref 0–44)
AST: 29 U/L (ref 15–41)
Albumin: 2.9 g/dL — ABNORMAL LOW (ref 3.5–5.0)
Alkaline Phosphatase: 74 U/L (ref 38–126)
Anion gap: 12 (ref 5–15)
BUN: 13 mg/dL (ref 8–23)
CO2: 23 mmol/L (ref 22–32)
Calcium: 9.1 mg/dL (ref 8.9–10.3)
Chloride: 100 mmol/L (ref 98–111)
Creatinine, Ser: 0.97 mg/dL (ref 0.44–1.00)
GFR, Estimated: 58 mL/min — ABNORMAL LOW (ref >60)
Glucose, Bld: 130 mg/dL — ABNORMAL HIGH (ref 70–99)
Potassium: 3.4 mmol/L — ABNORMAL LOW (ref 3.5–5.1)
Sodium: 135 mmol/L (ref 135–145)
Total Bilirubin: 0.7 mg/dL (ref 0.0–1.2)
Total Protein: 6.1 g/dL — ABNORMAL LOW (ref 6.5–8.1)

## 2023-12-28 LAB — CBC
HCT: 27.6 % — ABNORMAL LOW (ref 36.0–46.0)
Hemoglobin: 9.2 g/dL — ABNORMAL LOW (ref 12.0–15.0)
MCH: 31.1 pg (ref 26.0–34.0)
MCHC: 33.3 g/dL (ref 30.0–36.0)
MCV: 93.2 fL (ref 80.0–100.0)
Platelets: 333 K/uL (ref 150–400)
RBC: 2.96 MIL/uL — ABNORMAL LOW (ref 3.87–5.11)
RDW: 13.6 % (ref 11.5–15.5)
WBC: 9.9 K/uL (ref 4.0–10.5)
nRBC: 0 % (ref 0.0–0.2)

## 2023-12-28 LAB — MAGNESIUM: Magnesium: 1.6 mg/dL — ABNORMAL LOW (ref 1.7–2.4)

## 2023-12-28 LAB — TSH: TSH: 5.471 u[IU]/mL — ABNORMAL HIGH (ref 0.350–4.500)

## 2023-12-28 MED ORDER — LOSARTAN POTASSIUM 100 MG PO TABS: 100.0000 mg | ORAL_TABLET | Freq: Every day | ORAL | 2 refills | Status: DC

## 2023-12-28 MED ORDER — POTASSIUM CHLORIDE CRYS ER 20 MEQ PO TBCR
40.0000 meq | EXTENDED_RELEASE_TABLET | Freq: Every day | ORAL | 0 refills | Status: AC
Start: 2023-12-29 — End: ?

## 2023-12-28 MED ORDER — POTASSIUM CHLORIDE CRYS ER 20 MEQ PO TBCR
40.0000 meq | EXTENDED_RELEASE_TABLET | Freq: Once | ORAL | Status: AC
  Administered 2023-12-28: 40 meq via ORAL
  Filled 2023-12-28: qty 2

## 2023-12-28 MED ORDER — HYDRALAZINE HCL 25 MG PO TABS
25.0000 mg | ORAL_TABLET | Freq: Once | ORAL | Status: AC
  Administered 2023-12-28: 25 mg via ORAL
  Filled 2023-12-28: qty 1

## 2023-12-28 MED ORDER — LOSARTAN POTASSIUM 50 MG PO TABS
100.0000 mg | ORAL_TABLET | Freq: Every day | ORAL | Status: DC
  Administered 2023-12-28: 100 mg via ORAL
  Filled 2023-12-28: qty 2

## 2023-12-28 MED ORDER — AMIODARONE HCL 200 MG PO TABS
ORAL_TABLET | ORAL | 1 refills | Status: DC
Start: 2023-12-28 — End: 2024-02-01

## 2023-12-28 MED ORDER — MAGNESIUM SULFATE 2 GM/50ML IV SOLN
2.0000 g | Freq: Once | INTRAVENOUS | Status: AC
  Administered 2023-12-28: 2 g via INTRAVENOUS
  Filled 2023-12-28: qty 50

## 2023-12-28 MED ORDER — AMIODARONE HCL 200 MG PO TABS
200.0000 mg | ORAL_TABLET | Freq: Every day | ORAL | Status: DC
  Administered 2023-12-28: 200 mg via ORAL
  Filled 2023-12-28: qty 1

## 2023-12-28 MED ORDER — METOPROLOL SUCCINATE ER 50 MG PO TB24
50.0000 mg | ORAL_TABLET | Freq: Two times a day (BID) | ORAL | Status: DC
  Administered 2023-12-28: 50 mg via ORAL
  Filled 2023-12-28: qty 1

## 2023-12-28 MED ORDER — FUROSEMIDE 40 MG PO TABS: 40.0000 mg | ORAL_TABLET | Freq: Every day | ORAL | 0 refills | Status: DC

## 2023-12-28 MED ORDER — AMIODARONE HCL 200 MG PO TABS: 400.0000 mg | ORAL_TABLET | Freq: Every day | ORAL | Status: DC

## 2023-12-28 MED ORDER — POTASSIUM CHLORIDE CRYS ER 20 MEQ PO TBCR: 40.0000 meq | EXTENDED_RELEASE_TABLET | Freq: Every day | ORAL | Status: DC

## 2023-12-28 NOTE — Care Management Important Message (Signed)
 Important Message  Patient Details  Name: CATALEAH STITES MRN: [id number] Date of Birth: September 27, 1940   Important Message Given:  Yes - Medicare IM     Vonzell Arrie Sharps 12/28/2023, 11:22 AM

## 2023-12-28 NOTE — Progress Notes (Signed)
 Progress Note  Patient Name: [name] Date of Encounter: 12/28/2023  Primary Cardiologist: Jayson Sierras, MD   Subjective   Patient seen and examined at her bedside.    Inpatient Medications    Scheduled Meds:  amiodarone   200 mg Oral Daily   aspirin  EC  81 mg Oral Daily   fluticasone  furoate-vilanterol  1 puff Inhalation Daily   gabapentin   300 mg Oral Daily   losartan   50 mg Oral Daily   metoprolol  succinate  25 mg Oral Daily   pantoprazole   40 mg Oral Daily   polyethylene glycol  17 g Oral Daily   rivaroxaban   15 mg Oral Q supper   rosuvastatin   5 mg Oral QHS   senna-docusate  2 tablet Oral BID   Continuous Infusions:   PRN Meds: acetaminophen , albuterol , docusate sodium , nitroGLYCERIN , ondansetron  (ZOFRAN ) IV, oxyCODONE -acetaminophen    Vital Signs    Vitals:   12/27/23 1814 12/27/23 2051 12/28/23 0000 12/28/23 0356  BP: (!) 168/85 (!) 177/53 (!) 177/64 (!) 166/63  Pulse: 72 70 73 84  Resp: 19 16 20 15   Temp: 97.9 F (36.6 C) 98.3 F (36.8 C) 97.9 F (36.6 C) 98.2 F (36.8 C)  TempSrc: Oral Oral Oral Oral  SpO2: 99% 100% 97% 99%  Weight:      Height:        Intake/Output Summary (Last 24 hours) at 12/28/2023 0733 Last data filed at 12/28/2023 9362 Gross per 24 hour  Intake 599.59 ml  Output 700 ml  Net -100.41 ml   Filed Weights   12/24/23 1800  Weight: 108.1 kg    Telemetry    Sinus rhythm with some runs of NSVT Personally Reviewed  ECG     - Personally Reviewed  Physical Exam     General: Comfortable, sitting up in a chair Head: Atraumatic, normal size  Eyes: PEERLA, EOMI  Neck: Supple, normal JVD Cardiac: Normal S1, S2; RRR; no murmurs, rubs, or gallops Lungs: Clear to auscultation bilaterally Abd: Soft, nontender, no hepatomegaly  Ext: warm, no edema Musculoskeletal: No deformities, BUE and BLE strength normal and equal Skin: Warm and dry, no rashes   Neuro: Alert and oriented to person, place, time, and situation,  CNII-XII grossly intact, no focal deficits  Psych: Normal mood and affect   Labs    Chemistry Recent Labs  Lab 12/26/23 0421 12/27/23 0448 12/28/23 0412  NA 135 137 135  K 3.4* 3.9 3.4*  CL 102 105 100  CO2 21* 22 23  GLUCOSE 151* 123* 130*  BUN 20 13 13   CREATININE 0.90 0.98 0.97  CALCIUM  8.8* 9.0 9.1  PROT  --   --  6.1*  ALBUMIN   --   --  2.9*  AST  --   --  29  ALT  --   --  15  ALKPHOS  --   --  74  BILITOT  --   --  0.7  GFRNONAA >60 57* 58*  ANIONGAP 12 10 12      Hematology Recent Labs  Lab 12/26/23 0421 12/27/23 0448 12/28/23 0412  WBC 10.6* 10.0 9.9  RBC 2.83* 2.82* 2.96*  HGB 8.9* 8.8* 9.2*  HCT 26.9* 26.9* 27.6*  MCV 95.1 95.4 93.2  MCH 31.4 31.2 31.1  MCHC 33.1 32.7 33.3  RDW 13.2 13.4 13.6  PLT 291 280 333    Cardiac EnzymesNo results for input(s): TROPONINI in the last 168 hours. No results for input(s): TROPIPOC in the last 168 hours.  BNPNo results for input(s): BNP, PROBNP in the last 168 hours.   DDimer No results for input(s): DDIMER in the last 168 hours.   Radiology    DG CHEST PORT 1 VIEW Result Date: 12/27/2023 CLINICAL DATA:  Shortness of breath. EXAM: PORTABLE CHEST 1 VIEW COMPARISON:  12/15/2023 and CT chest 10/30/2021. FINDINGS: Trachea is midline. Heart is enlarged. Thoracic aorta is calcified. Basilar interstitial prominence and indistinctness, new from 12/15/2023. Underlying subpleural fibrosis, better seen on comparison CT chest. No definite pleural fluid. IMPRESSION: Pulmonary edema. Electronically Signed   By: Newell Eke M.D.   On: 12/27/2023 11:21    Cardiac Studies   Echocardiogram  Patient Profile     83 y.o. female with nonobstructive CAD, persistent atrial fibrillation status post ablation in 2023, status post total knee arthroplasty presented with elevated troponin in A-fib RVR Assessment & Plan    Dyspnea on exertion Paroxysmal atrial fibrillation now in sinus rhythm Frequent  PACs NSTEMI Nonobstructive CAD Status post total knee arthroplasty NSVT   NSVT on the monitor - she is not on her home dose of toprol  will adjust this today. Replace magnesium .   Her shortness of breath has improved with the dose on the lasix  and she is not on oxygen.   Continue amiodarone  200 mg  twice daily for the next 7 days and then 200 mg daily.  Blood pressure still elevated increase home losartan  to 100 mg daily.   Transitioned off heparin  yesterday, now on her xeralto.  CODE STATUS: DNR limited DVT prophylaxis on xeralto Disposition - home today after her magnesium  replacement  For questions or updates, please contact [facility] Please consult [email or web address] for contact info under Cardiology/STEMI.      Signed, Audine Mangione, DO  12/28/2023, 7:33 AM

## 2023-12-28 NOTE — Progress Notes (Signed)
 Pt expressed verbal understanding of discharge POC.  Patients SBP continuously elevated.  Asymptomatic.   Pt denies any chest pain or sob. at this time.  Burnard RN notified. Patient currently lying in bed.  Tele monitor on.

## 2023-12-28 NOTE — Progress Notes (Signed)
 Nurse called reporting BP before DC was 192/69, she has been hypertensive and losartan  was increased today, morning BP meds given at 820. Advised hold off discharge, hydralazine  25mg  x1 ordered, will need to repeat BP and call cardiology back to see if DC is appropriate.

## 2023-12-28 NOTE — TOC Transition Note (Signed)
 Transition of Care [facility]) - Discharge Note   Patient Details  Name: [name] MRN: [id number] Date of Birth: [birth year]  [facility] CM/SW Contact:  Roxie KANDICE Stain, RN Phone Number: 12/28/2023, 12:10 PM   Clinical Narrative:    [name] is stable to discharge home.  No TOC needs at this time.    Final next level of care: Home/Self Care Barriers to Discharge: Barriers Resolved   Patient Goals and CMS Choice         Return home   Discharge Placement               home        Discharge Plan and Services Additional resources added to the After Visit Summary for                                       Social Drivers of Health (SDOH) Interventions SDOH Screenings   Food Insecurity: No Food Insecurity (12/24/2023)  Housing: Low Risk  (12/24/2023)  Transportation Needs: No Transportation Needs (12/24/2023)  Utilities: Not At Risk (12/24/2023)  Financial Resource Strain: Low Risk  (12/03/2023)   Received from [facility]  Physical Activity: Insufficiently Active (12/03/2023)   Received from [facility]  Social Connections: Socially Integrated (12/24/2023)  Stress: No Stress Concern Present (12/03/2023)   Received from [facility]  Tobacco Use: Medium Risk (12/25/2023)     Readmission Risk Interventions    12/28/2023   12:10 PM  Readmission Risk Prevention Plan  Post Dischage Appt Complete  Medication Screening Complete  Transportation Screening Complete

## 2023-12-28 NOTE — Progress Notes (Signed)
 Patient's BP 192/69 on discharge. Notified MD. Hold DC until patient's bp decrease with medications. See Mar

## 2023-12-28 NOTE — Discharge Summary (Signed)
 Discharge Summary   Patient ID: [name] MRN: [id number]; DOB: January 23, 1941  Admit date: 12/24/2023 Discharge date: 12/28/2023  PCP:  Suanne Pfeiffer, NP   [facility] Providers Cardiologist:  Jayson Sierras, MD  Electrophysiologist:  OLE ONEIDA HOLTS, MD     Discharge Diagnoses  Principal Problem:   NSTEMI (non-ST elevated myocardial infarction) [facility]) Active Problems:   Coronary atherosclerosis of native coronary artery   Asthma   Hypertensive heart disease   Atrial fibrillation with RVR (HCC)   Diagnostic Studies/Procedures   Echocardiogram from 12/25/2023:   1. Left ventricular ejection fraction, by estimation, is 60 to 65%. Left  ventricular ejection fraction by PLAX is 63 %. The left ventricle has  normal function. The left ventricle has no regional wall motion  abnormalities. There is moderate left  ventricular hypertrophy. Left ventricular diastolic parameters are  consistent with Grade II diastolic dysfunction (pseudonormalization).  Elevated left ventricular end-diastolic pressure. The E/e' is 17.   2. Right ventricular systolic function is low normal. The right  ventricular size is normal. Tricuspid regurgitation signal is inadequate  for assessing PA pressure.   3. Left atrial size was mildly dilated.   4. The mitral valve is degenerative. Mild mitral valve regurgitation.   5. The aortic valve is tricuspid. Aortic valve regurgitation is trivial.  Aortic valve sclerosis/calcification is present, without any evidence of  aortic stenosis.   6. The inferior vena cava is normal in size with <50% respiratory  variability, suggesting right atrial pressure of 8 mmHg.   Comparison(s): Changes from prior study are noted. 10/31/2021: LVEF 60-65%.    _____________   History of Present Illness     Per admission H&P by Dr. Vonda on 12/24/2023:  [name] is a 83 y.o. female with past medical history significant of nonobstructive coronary artery  disease, persistent A-fib status post ablation (2023) on Xarelto , HTN, asthma/COPD, RA, hypothyroidism, chronic pain, who was seen 12/24/2023 for the evaluation of A-fib with RVR, elevated troponins.   Ms. Rohner has a history of nonobstructive coronary artery disease with left heart cath in May of this year demonstrating mid RCA lesion 60% stenosed, proximal circumflex 50% stenosis. She presented with altered mental status and chest pain, found to be in A-fib with RVR at outside hospital.   Briefly, patient stated that she has been having intermittent chest pain starting about a day or 2 ago.  She stated that this pain is similar to her A-fib for which she is used to and does not feel different.  She underwent a left knee replacement on 12/21/23 for which her anticoagulation had been held.  She also noted some more subacute shortness of breath on exertion over the last couple of weeks.  Denied any recent infection, cough, productive sputum, orthopnea, PND, or worsening lower extremity edema prior to her surgery. She does not have a history of heart failure, most recent echo in February with normal biventricular function and some diastolic dysfunction.   She stated that her son was over this morning and noticed she was not acting right and so called EMS.  At that time was found to be in A-fib with rapid ventricular rate.  She stated that she has had minimal bowel movements and has not peed in 1 to 2 days since her procedure, she had been on narcotics, she denied abdominal pain, dysuria, nausea or vomiting.  She had not eaten much due to lack of appetite but she has some now.  She denied  current chest pain.   In outside hospital ED she was noted to be in A-fib with RVR with lower blood pressures.  Placed on a dill drip with conversion to sinus rhythm with frequent PACs.  No EKG available.  Troponin elevated at [phone].  proBNP 8364.  Creatinine 1.2.  TSH normal.  CBC with leukocytosis and anemia at 13.1 and  8.9 respectively.  Normal platelets.  CT was  negative for PE.    Hospital Course   Consultants: N/A   Non-STEMI Nonobstructive CAD - Presented to the ER 12/24/2023 with altered mental status, intermittent chest pain for 2 days, found in A-fib RVR; reported chest pain is similar to previous symptomatic A-fib - High sensitive troponin 4180>673 - EKG with old anteroseptal infarct, no acute changes  - LHC 09/15/23 showed non-obstructive CAD with 60% mid RCA, 20% proximal RCA, 20% mid LAD, 50% proximal circumflex, 60% lpav; normal LVEDP, no aortic stenosis - LDL 30 from 12/25/23, Hgb A1C 5.9% 12/03/23 - Echocardiogram from 12/25/2023 showed LVEF 60 to 65%, no regional wall motion abnormality, moderate LVH, grade 2 DD, low normal RV systolic function, mild LAE, mild MR, trivial AI, aortic sclerosis, IVC normal. -She was planned for cardiac catheterization initially, given echocardiogram revealing no wall motion abnormality and normal EF, felt type II NSTEMI, cardiac catheterization was canceled and she was treated medically - Medical therapy: S/p IV heparin  drip for 48 hours, will stop aspirin  81 mg daily chronic use of Xarelto , continued PTA Toprol -XL 50 mg twice daily, PTA losartan  has been increased from 50 to 100 mg daily due to elevated blood pressure, continue PTA Crestor  5 mg daily, and continue PTA PRN nitroglycerin ; [facility]/confirmed stock and patient is able travel to  - She already has follow-up with [facility] office on 12/31/23, will keep this appointment  A-fib with RVR, hx of persistent A fib s/p ablation 11/2021  - long standing history of A fib, s/p ablation 11/2021, historically has chest pain associated with A fib  - Will add TSH level today, this can be followed up at follow-up visit; LFTs WNL  - Echocardiogram from 12/25/2023 as mentioned above -Telemetry has revealed PVCs and NSVT's this admission - She was given IV diltiazem  drip at ED with  spontaneous conversion to sinus rhythm, had recurrence of A-fib RVR and started on amiodarone  drip 12/26/2023 morning, now transition to PO amiodarone  200 mg twice daily for 7 days followed by 200 mg daily per Dr Sheena request; new prescription has been sent to pharmacy today - Continue PTA metoprolol  XL 50 mg twice daily and Xarelto  15 mg daily (CrCl 54ml/min today , unclear why she is on 15mg  daily instead of 20mg  daily, this should be clarified at next follow up visit with primary cardiologist, she is not aware of any major bleeding) - Consider referral back to EP team for rhythm control options, she has followed Dr. Cindie in the past  Altered mental status, resolved - Initially altered neuroexam had resolved, no focal neurodeficit observed this admission, felt this might be due to polypharmacy from her recent knee surgery, should streamline narcotics and muscle relaxant given advanced age   Acute diastolic heart failure - She had elevated proBNP 8,364 at [facility]; she was felt not volume overloaded on exam at admission, later she had c/o new onset of DOE, CXR 12/27/23 here showed pulmonary edema; she was given IV lasix  40mg  x1 yesterday, felt had improvement of volume status today  -  Echocardiogram from 12/25/2023 showed LVEF 60 to 65%, no RWMA, moderate LVH, grade 2 DD, low normal RV systolic function, mild LAE, mild MR, trivial AI - maybe tachycardia mediated versus diastolic failure  - discussed with Dr Sheena, recommend discharge on PO Lasix  40mg  daily for 3 days along with K and Mag supplement, new script sent to pharmacy today  - consider repeat BMP at next visit   Hypokalemia Hypomagnesia - K 3.4 and Mag 1.6 today - 40 meq K and 2g IV Mag given today before discharge  - will discharge on 40 meq K daily for 3 days given continued lasix  use as above  - consider repeat BMP at next visit   HTN - Blood pressure has been elevated, suspect pain is contributing, PTA losartan  has been increased from  50 to 100 mg daily, continue metoprolol  XL 50 mg twice daily, Lasix  40 mg daily for next 3 days, will reassess blood pressure at next visit  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 with post-op pain Osteoarthritis Rheumatoid arthritis Chronic pain syndrome - Continue postop PRN narcotics and muscle relaxant as prescribed by surgical team - Noted she is on Celebrex  200 mg twice daily, would not continue this medication while taking Xarelto  - Advised patient to follow-up with orthopedic surgery and PCP as scheduled  Asthma and COPD - PTA bronchodilator continued, no acute issue at this admission, follow-up with PCP as scheduled  Anemia - Hgb 8-9, stable, no bleeding issue so far, likely due to recent blood loss from surgery , previous baseline 11-12 before surgery, follow up with PCP      Did the patient have an acute coronary syndrome (MI, NSTEMI, STEMI, etc) this admission?:  Yes                               AHA/ACC ACS Clinical Performance & Quality Measures: Aspirin  prescribed? - No - chronic Xarelto  and no intervention this admission ADP Receptor Inhibitor (Plavix/Clopidogrel, Brilinta/Ticagrelor or Effient/Prasugrel) prescribed (includes medically managed patients)? - No - on chronic Xarelto  no intervention this admission Beta Blocker prescribed? - Yes High Intensity Statin (Lipitor 40-80mg  or Crestor  20-40mg ) prescribed? - No - LDL is at goal with current statin therapy EF assessed during THIS hospitalization? - Yes For EF <40%, was ACEI/ARB prescribed? - Yes For EF <40%, Aldosterone Antagonist (Spironolactone or Eplerenone) prescribed? - Not Applicable (EF >/= 40%) Cardiac Rehab Phase II ordered (including medically managed patients)? - No - no intervention this admission       Discharge Vitals Blood pressure (!) 188/64, pulse 74, temperature 98.3 F (36.8 C), temperature source Oral, resp. rate 20, height 5' 5 (1.651 m), weight 108.1 kg, SpO2 98%.  Filed Weights   12/24/23 1800  Weight:  108.1 kg   See Dr Sheena progress note today for physical exam    Labs & Radiologic Studies  CBC Recent Labs    12/27/23 0448 12/28/23 0412  WBC 10.0 9.9  HGB 8.8* 9.2*  HCT 26.9* 27.6*  MCV 95.4 93.2  PLT 280 333   Basic Metabolic Panel Recent Labs    91/68/74 0448 12/28/23 0412  NA 137 135  K 3.9 3.4*  CL 105 100  CO2 22 23  GLUCOSE 123* 130*  BUN 13 13  CREATININE 0.98 0.97  CALCIUM  9.0 9.1  MG  --  1.6*   Liver Function Tests Recent Labs    12/28/23 0412  AST 29  ALT 15  ALKPHOS 74  BILITOT 0.7  PROT 6.1*  ALBUMIN  2.9*   No results for input(s): LIPASE, AMYLASE in the last 72 hours. High Sensitivity Troponin:   Recent Labs  Lab 12/25/23 1244 12/25/23 1527  TROPONINIHS 4,180* 673*    No results for input(s): TRNPT in the last 720 hours.  BNP Invalid input(s): POCBNP No results for input(s): PROBNP in the last 72 hours.  No results for input(s): BNP in the last 72 hours.  D-Dimer No results for input(s): DDIMER in the last 72 hours. Hemoglobin A1C No results for input(s): HGBA1C in the last 72 hours. Fasting Lipid Panel No results for input(s): CHOL, HDL, LDLCALC, TRIG, CHOLHDL, LDLDIRECT in the last 72 hours.  No results found for: LIPOA  Thyroid  Function Tests No results for input(s): TSH, T4TOTAL, T3FREE, THYROIDAB in the last 72 hours.  Invalid input(s): FREET3 _____________  DG CHEST PORT 1 VIEW Result Date: 12/27/2023 CLINICAL DATA:  Shortness of breath. EXAM: PORTABLE CHEST 1 VIEW COMPARISON:  12/15/2023 and CT chest 10/30/2021. FINDINGS: Trachea is midline. Heart is enlarged. Thoracic aorta is calcified. Basilar interstitial prominence and indistinctness, new from 12/15/2023. Underlying subpleural fibrosis, better seen on comparison CT chest. No definite pleural fluid. IMPRESSION: Pulmonary edema. Electronically Signed   By: Newell Eke M.D.   On: 12/27/2023 11:21   ECHOCARDIOGRAM  COMPLETE Result Date: 12/25/2023    ECHOCARDIOGRAM REPORT   Patient Name:   [name] Date of Exam: 12/25/2023 Medical Rec #:  [id number]      Height:       65.0 in Accession #:    [id number]     Weight:       238.3 lb Date of Birth:  [birth year]     BSA:          2.131 m Patient Age:    82 years       BP:           142/48 mmHg Patient Gender: F              HR:           57 bpm. Exam Location:  Inpatient Procedure: 2D Echo, Color Doppler and Cardiac Doppler (Both Spectral and Color            Flow Doppler were utilized during procedure). Indications:    Abn ECG R94.31  History:        Patient has prior history of Echocardiogram examinations, most                 recent 10/31/2021.  Sonographer:    Tinnie Gosling RDCS Referring Phys: [id number] MICHAEL COSIANO IMPRESSIONS  1. Left ventricular ejection fraction, by estimation, is 60 to 65%. Left ventricular ejection fraction by PLAX is 63 %. The left ventricle has normal function. The left ventricle has no regional wall motion abnormalities. There is moderate left ventricular hypertrophy. Left ventricular diastolic parameters are consistent with Grade II diastolic dysfunction (pseudonormalization). Elevated left ventricular end-diastolic pressure. The E/e' is 17.  2. Right ventricular systolic function is low normal. The right ventricular size is normal. Tricuspid regurgitation signal is inadequate for assessing PA pressure.  3. Left atrial size was mildly dilated.  4. The mitral valve is degenerative. Mild mitral valve regurgitation.  5. The aortic valve is tricuspid. Aortic valve regurgitation is trivial. Aortic valve sclerosis/calcification is present, without any evidence of aortic stenosis.  6. The inferior vena cava is normal in size with <50% respiratory variability, suggesting right  atrial pressure of 8 mmHg. Comparison(s): Changes from prior study are noted. 10/31/2021: LVEF 60-65%. FINDINGS  Left Ventricle: Left ventricular ejection fraction, by estimation, is  60 to 65%. Left ventricular ejection fraction by PLAX is 63 %. The left ventricle has normal function. The left ventricle has no regional wall motion abnormalities. The left ventricular internal cavity size was normal in size. There is moderate left ventricular hypertrophy. Left ventricular diastolic parameters are consistent with Grade II diastolic dysfunction (pseudonormalization). Elevated left ventricular end-diastolic pressure. The E/e' is 19. Right Ventricle: The right ventricular size is normal. No increase in right ventricular wall thickness. Right ventricular systolic function is low normal. Tricuspid regurgitation signal is inadequate for assessing PA pressure. Left Atrium: Left atrial size was mildly dilated. Right Atrium: Right atrial size was normal in size. Pericardium: There is no evidence of pericardial effusion. Mitral Valve: The mitral valve is degenerative in appearance. Mild to moderate mitral annular calcification. Mild mitral valve regurgitation. Tricuspid Valve: The tricuspid valve is grossly normal. Tricuspid valve regurgitation is trivial. Aortic Valve: The aortic valve is tricuspid. Aortic valve regurgitation is trivial. Aortic valve sclerosis/calcification is present, without any evidence of aortic stenosis. Pulmonic Valve: The pulmonic valve was grossly normal. Pulmonic valve regurgitation is trivial. Aorta: The aortic root and ascending aorta are structurally normal, with no evidence of dilitation. Venous: The inferior vena cava is normal in size with less than 50% respiratory variability, suggesting right atrial pressure of 8 mmHg. IAS/Shunts: No atrial level shunt detected by color flow Doppler.  LEFT VENTRICLE PLAX 2D LV EF:         Left            Diastology                ventricular     LV e' medial:    6.74 cm/s                ejection        LV E/e' medial:  19.3                fraction by     LV e' lateral:   9.03 cm/s                PLAX is 63      LV E/e' lateral: 14.4                 %. LVIDd:         4.33 cm LVIDs:         2.85 cm LV PW:         1.42 cm LV IVS:        1.23 cm LVOT diam:     1.79 cm LV SV:         55 LV SV Index:   26 LVOT Area:     2.52 cm  RIGHT VENTRICLE         IVC TAPSE (M-mode): 1.8 cm  IVC diam: 1.68 cm LEFT ATRIUM           Index LA diam:      3.61 cm 1.69 cm/m LA Vol (A4C): 31.3 ml 14.69 ml/m  AORTIC VALVE LVOT Vmax:   91.00 cm/s LVOT Vmean:  63.900 cm/s LVOT VTI:    0.217 m  AORTA Ao Root diam: 2.65 cm Ao Asc diam:  2.99 cm MITRAL VALVE MV Area (PHT): 4.46 cm     SHUNTS MV Decel Time: 170 msec  Systemic VTI:  0.22 m MV E velocity: 130.00 cm/s  Systemic Diam: 1.79 cm MV A velocity: 73.40 cm/s MV E/A ratio:  1.77 Vinie Maxcy MD Electronically signed by Vinie Maxcy MD Signature Date/Time: 12/25/2023/2:38:43 PM    Final    DG Chest 2 View Result Date: 12/15/2023 CLINICAL DATA:  Preop chest exam.  Upcoming knee surgery. EXAM: CHEST - 2 VIEW COMPARISON:  Radiograph and CT 10/30/2021 FINDINGS: The heart is normal in size. Mediastinal contours are stable. Apical emphysema. Subpleural reticulation, greatest in the lung bases, similar to prior. No confluent consolidation. No pleural effusion or pneumothorax. No pulmonary edema. Right chest wall/breast surgical clips. IMPRESSION: Underlying chronic lung disease, emphysema versus interstitial lung disease. No acute findings. Electronically Signed   By: Andrea Gasman M.D.   On: 12/15/2023 16:11    Disposition Patient is seen by Dr. Aloysius today, deemed stable for discharge to home.  Medication change has been reviewed with the patient over the phone .  [facility] and patient is able to travel to .  She has existing follow-up appointment arranged on 12/31/23 .  All question answered to satisfaction.  Pt is being discharged home today in good condition.  Follow-up Plans & Appointments  Follow-up Information     Miriam Norris, NP Follow up on  12/31/2023.   Specialty: Cardiology Why: at 1:30pm for your cardiology follow up appointment Contact information: [address] [place] [place] [address] [phone]                Discharge Instructions     Diet - low sodium heart healthy   Complete by: As directed    Discharge instructions   Complete by: As directed    [facility] located at     [address] at [address] Phone number [phone]  Take amiodarone  200mg  twice daily for 7 days starting today, followed up 200mg  once daily starting 01/04/24, for A fib Take losartan  100mg  daily for blood pressure control, check your BP daily and bring records to next follow up appointment  Take Lasix  40mg  daily, along with potassium supplement 40 meq daily for 3 days starting today for heart failure   Follow up with cardiology on 12/31/23 at [facility] office     HEART FAILURE INSTRUCTIONS   Follow a low salt diet which means you need to consume less than 2 grams (2000mg ) of sodium per day.  Check the labels!  You'll be surprised to see how much sodium is in common foods and drinks.  DO NOT EAT foods high in salt, such as salted nuts, crackers, smoked fish, fast food, deli meats, salami, pepperoni, jerky, prosciutto, ham, bacon, soups, prepared frozen dinners.  DO NOT add salt to your food when cooking or at the table.  It is ok to use other seasonings and flavors as long as they are salt free.    Drink less than 8 cups (which is ~2011ml or 2 liters) of fluid per day.  This is equivalent to ~ 4 bottles of water  a day.  Weigh yourself every morning before breakfast and write it down in a log.  Bring in your record to every clinic visit.  Check for swelling in your feet, ankles, legs and stomach every morning.  Take an extra dose of your fluid pill once a day for worsening shortness of breath, swelling, or >  3lb weight gain in 24 hours or >5lb weight gain in 1  week.  Control your blood pressure.  If you have a blood pressure cuff, record your blood pressure & heart rate daily. Bring the record to [facility] clinic with you.  Goal BP is <140/90  Control your blood sugars if you are diabetic  Control your cholesterol.  The goal LDL (bad cholesterol) is <70  Lose weight if you are overweight  Exercise as much as you are able to strengthen your heart  Take your medicine the way you are instructed   Bring ALL your medicines and medication list to [facility]IC VISIT clinic   Get a yearly flu shot to reduce your risk of the flu   If you are under age 46 you need the Pneumovax pneumonia vaccine as a one time shot to reduce the risk of pneumonia   If you are 53 yrs or older you need the Prevnar pneumonia vaccine as a one time shot followed by a Pneumovax pneumonia vaccine > 1 year later as a one time shot to help reduce your risk of pneumonia   Call nurse if you have trouble paying for your medications, are running low on pills, having trouble with transportation or if you are gaining weight quickly, having significant swelling, trouble breathing, chest pain, dizziness or fainting spells.          Increase activity slowly   Complete by: As directed    No wound care   Complete by: As directed        Discharge Medications Allergies as of 12/28/2023       Reactions   Penicillins Anaphylaxis, Swelling, Rash   Blistering rash   Eliquis  [apixaban ] Hives        Medication List     STOP taking these medications    celecoxib  200 MG capsule Commonly known as: CELEBREX    oxyCODONE  5 MG immediate release tablet Commonly known as: Oxy IR/ROXICODONE    oxyCODONE -acetaminophen  5-325 MG tablet Commonly known as: PERCOCET/ROXICET   tiZANidine  2 MG tablet Commonly known as: ZANAFLEX        TAKE these medications    albuterol  108 (90 Base) MCG/ACT inhaler Commonly known as: VENTOLIN  HFA Inhale 1-2 puffs into the lungs every 6 (six) hours as  needed for shortness of breath or wheezing.   amiodarone  200 MG tablet Commonly known as: PACERONE  Take 1 tablet (200 mg total) by mouth 2 (two) times daily for 7 days, THEN 1 tablet (200 mg total) daily. Start taking on: December 28, 2023   budesonide-formoterol 160-4.5 MCG/ACT inhaler Commonly known as: SYMBICORT Inhale 2 puffs into the lungs daily as needed (respiratory issues.).   docusate sodium  100 MG capsule Commonly known as: COLACE Take 100 mg by mouth daily after supper.   furosemide  40 MG tablet Commonly known as: Lasix  Take 1 tablet (40 mg total) by mouth daily.   gabapentin  300 MG capsule Commonly known as: NEURONTIN  Take 300 mg by mouth daily after supper.   HYDROcodone -acetaminophen  7.5-325 MG tablet Commonly known as: NORCO Take 1 tablet by mouth every 6 (six) hours as needed for moderate pain (pain score 4-6).   losartan  100 MG tablet Commonly known as: COZAAR  Take 1 tablet (100 mg total) by mouth daily. Start taking on: December 29, 2023 What changed:  medication strength how much to take   methocarbamol  500 MG tablet Commonly known as: ROBAXIN  Take 500 mg by mouth every 6 (six) hours as needed for muscle spasms.  metoprolol  succinate 50 MG 24 hr tablet Commonly known as: TOPROL -XL Take 1 tablet (50 mg total) by mouth in the morning and at bedtime. Take with or immediately following a meal. What changed:  when to take this additional instructions Another medication with the same name was removed. Continue taking this medication, and follow the directions you see here.   nitroGLYCERIN  0.4 MG SL tablet Commonly known as: NITROSTAT  Place 1 tablet (0.4 mg total) under the tongue every 5 (five) minutes as needed for chest pain.   omeprazole  20 MG tablet Commonly known as: PRILOSEC OTC Take 20 mg by mouth daily as needed (heartburn).   potassium chloride  SA 20 MEQ tablet Commonly known as: KLOR-CON  M Take 2 tablets (40 mEq total) by mouth  daily. Start taking on: December 29, 2023   Rivaroxaban  15 MG Tabs tablet Commonly known as: Xarelto  Take 1 tablet (15 mg total) by mouth daily with supper. What changed: when to take this   rosuvastatin  5 MG tablet Commonly known as: CRESTOR  Take 1 tablet (5 mg total) by mouth at bedtime. What changed: when to take this         Outstanding Labs/Studies N/a  Duration of Discharge Encounter: APP Time: 50 minutes   Signed, Shirl Fruits, NP 12/28/2023, 10:57 AM

## 2023-12-28 NOTE — Progress Notes (Signed)
 Patient's BP decreased. Notified MD. OK to discharge

## 2023-12-29 ENCOUNTER — Other Ambulatory Visit: Payer: Self-pay

## 2023-12-31 ENCOUNTER — Ambulatory Visit: Attending: Nurse Practitioner | Admitting: Nurse Practitioner

## 2023-12-31 ENCOUNTER — Encounter: Payer: Self-pay | Admitting: Nurse Practitioner

## 2023-12-31 VITALS — BP 124/68 | HR 62 | Ht 65.5 in | Wt 238.0 lb

## 2023-12-31 DIAGNOSIS — I4891 Unspecified atrial fibrillation: Secondary | ICD-10-CM

## 2023-12-31 DIAGNOSIS — I5032 Chronic diastolic (congestive) heart failure: Secondary | ICD-10-CM

## 2023-12-31 DIAGNOSIS — I10 Essential (primary) hypertension: Secondary | ICD-10-CM

## 2023-12-31 DIAGNOSIS — R600 Localized edema: Secondary | ICD-10-CM

## 2023-12-31 DIAGNOSIS — I2510 Atherosclerotic heart disease of native coronary artery without angina pectoris: Secondary | ICD-10-CM

## 2023-12-31 MED ORDER — SACUBITRIL-VALSARTAN 24-26 MG PO TABS: 1.0000 | ORAL_TABLET | Freq: Two times a day (BID) | ORAL | 5 refills | Status: AC

## 2023-12-31 MED ORDER — FUROSEMIDE 40 MG PO TABS: 40.0000 mg | ORAL_TABLET | Freq: Every day | ORAL | 0 refills | Status: AC | PRN

## 2023-12-31 NOTE — Progress Notes (Addendum)
 Cardiology Office Note:  .   Date: 12/31/2023 ID:  [name], DOB 1940-05-25, MRN [id number] PCP: Suanne Pfeiffer, NP  [facility] Providers Cardiologist:  Jayson Sierras, MD Electrophysiologist:  OLE ONEIDA HOLTS, MD    History of Present Illness: .   [name] is a 83 y.o. female with a PMH of CAD, persistent A-fib, status post ablation in August 2023 (followed by EP), hypertension, asthma, COPD, GERD, hypothyroidism, rheumatoid arthritis/osteoarthritis, history of breast cancer, CKD, chronic pain syndrome, lumbar radiculopathy, and dependent leg edema, who presents today for follow-up.  Last seen by Dr. Sierras on December 30, 2022.  She was doing very well at that time.  05/14/2023 - Today she presents for follow-up.  She states she has not been doing well recently. Admits to recent DOE for the past 2 weeks, believes she is having issues with A-fib. She admits to recent swelling noted along her lower extremities bilaterally. BP is well-controlled at home even though BP is up today in the office.  She also admits to difficulty affording Xarelto , wants to know if she can have a more affordable medication option. Denies any chest pain, syncope, presyncope, dizziness, orthopnea, PND, significant weight changes, acute bleeding, or claudication.  06/23/2023 -presents today for follow-up.  Tells me she has not been doing well recently.  Her chief concerns are leg swelling that she noticed over the past weekend as well as nausea (denies any vomiting), and increased blood pressure.  She is unsure what is causing her high blood pressure as she has been reducing her salt intake.  Unsure what is causing her nausea as she denies any fever, chills, or any recent intolerance to certain meals. Denies any chest pain, shortness of breath, palpitations, syncope, presyncope, dizziness, orthopnea, PND, significant weight changes, acute bleeding, or claudication.  08/06/2023 -today she presents for  follow-up.  Doing some better in regards to leg swelling and denies any more nausea -she says her leg edema was noticed when legs were in dependent position, has had recent workup that showed lower extremity Doppler was negative for DVT.  Blood pressure has improved since last office visit, however she does admit to exertional chest tightness, noticed while going to get her mail.  She says when this happens, she experiences tightness where she has to sit down and rest, brief in duration, located along the center of her chest and denies any radiation.  She does admit to dyspnea on exertion with walking short distances.  Says she loves to exercise, but cannot do this like she wants to as she says she gives out easily and experiences shortness of breath, says has been going on for a while and seems to be a little worse recently.  She underwent left heart cath on Sep 15, 2023. See full report below.   11/19/2023 -  Here for follow-up. She admits to occasional right sided chest pain behind right breast tissue. Says it feels like indigestion, had a significant episode a while ago. Denies any specific triggers. Episodes last typically 2-3 hours over time, said last episode she had never had before. Left knee has been giving out, she is scheduled for an MRI next Tuesday. Denies any shortness of breath, palpitations, syncope, presyncope, dizziness, orthopnea, PND, swelling or significant weight changes, acute bleeding, or claudication.  Underwent left knee arthroplasty 12/21/2023. Hospitalized shortly after knee surgery d/t chest pain, shortness of breath with exertion noted over the last couple of weeks.  Chest pain was similar to  previous symptomatic A-fib.  Son noticed that she was not acting right and called EMS.  Was found to be in A-fib with RVR.  Initially presented to [facility] and was transferred to [facility] due to having troponin elevation at [phone].  proBNP 8364.  CT of chest was negative for PE.   She was planned for cardiac catheterization initially but given that her echocardiogram was reassuring it was felt to be type II NSTEMI, cardiac catheterization was canceled and she was treated medically.  Hospital course was noted by spontaneous conversion to normal sinus rhythm once given IV diltiazem  drip, had a recurrence of A-fib with RVR and was started on amiodarone  drip and transition to p.o. amiodarone .  Altered mental status resolved.  Echo revealed normal LVEF, moderate LVH, no RWMA, grade 2 DD, low normal RV systolic function, trivial AI, mild LAE, mild MR.  Was felt to be tachycardia mediated versus diastolic heart failure.  Was given short treatment of p.o. Lasix  for 3 days.  Today she presents for follow-up.  Tolerating her medications well.  She does admit to back pain that has been going on for few weeks, believes this is related to how she is sitting/possible arthritis. Doing well from a cardiac perspective. Denies any chest pain, shortness of breath, palpitations, syncope, presyncope, dizziness, orthopnea, PND, swelling or significant weight changes, acute bleeding, or claudication.   ROS: Negative.  See HPI.  Studies Reviewed: SABRA    EKG:  EKG Interpretation Date/Time:  Thursday December 31 2023 14:07:07 EDT Ventricular Rate:  62 PR Interval:  184 QRS Duration:  92 QT Interval:  428 QTC Calculation: 434 R Axis:   41  Text Interpretation: Sinus rhythm with Premature atrial complexes with Abberant conduction Low voltage QRS Septal infarct , age undetermined When compared with ECG of 26-Dec-2023 08:41, Sinus rhythm has replaced Atrial fibrillation Vent. rate has decreased BY  75 BPM Left bundle branch block is no longer Present Septal infarct is now Present Confirmed by Miriam Norris [phone]) on 01/07/2024 8:18:10 PM   Echo 11/2023: 1. Left ventricular ejection fraction, by estimation, is 60 to 65%. Left  ventricular ejection fraction by PLAX is 63 %. The left ventricle has   normal function. The left ventricle has no regional wall motion  abnormalities. There is moderate left  ventricular hypertrophy. Left ventricular diastolic parameters are  consistent with Grade II diastolic dysfunction (pseudonormalization).  Elevated left ventricular end-diastolic pressure. The E/e' is 17.   2. Right ventricular systolic function is low normal. The right  ventricular size is normal. Tricuspid regurgitation signal is inadequate  for assessing PA pressure.   3. Left atrial size was mildly dilated.   4. The mitral valve is degenerative. Mild mitral valve regurgitation.   5. The aortic valve is tricuspid. Aortic valve regurgitation is trivial.  Aortic valve sclerosis/calcification is present, without any evidence of  aortic stenosis.   6. The inferior vena cava is normal in size with <50% respiratory  variability, suggesting right atrial pressure of 8 mmHg.   Comparison(s): Changes from prior study are noted. 10/31/2021: LVEF 60-65%.   LHC 08/2023:    Mid RCA lesion is 60% stenosed. -Mild progression of disease; Prox RCA lesion is 20% stenosed.   Mid LAD lesion is 20% stenosed.   Prox Cx lesion is 50% stenosed. LPAV lesion is 60% stenosed (stable upon improved flow)   LV end diastolic pressure is normal.   There is no aortic valve stenosis.   Dominance: Right  POST-OPERATIVE DIAGNOSIS:   Nonobstructive coronary disease-relatively stable compared to 2021 with exception of progression of disease in the mid to distal RCA from 20% to 50%, stable proximal LCx 50% stenosis with ostium of AV groove circumflex 60% (previously read as 80%). Normal LVEDP     RECOMMENDATIONS   In the absence of any other complications or medical issues, we expect the patient to be ready for discharge from a cath perspective on 09/15/2023.   Recommend Aspirin  81mg  daily for moderate CAD.  Lexiscan  07/2023:  EKG Interpretation Date/Time:  Thursday December 31 2023 14:07:07 EDT Ventricular Rate:   62 PR Interval:  184 QRS Duration:  92 QT Interval:  428 QTC Calculation: 434 R Axis:   41  Text Interpretation: Sinus rhythm with Premature atrial complexes with Abberant conduction Low voltage QRS Septal infarct , age undetermined When compared with ECG of 26-Dec-2023 08:41, Sinus rhythm has replaced Atrial fibrillation Vent. rate has decreased BY  75 BPM Left bundle branch block is no longer Present Septal infarct is now Present Confirmed by Miriam Norris [phone]) on 01/07/2024 8:18:10 PM   Stress ECG is negative for ischemia and arrhythmias   LV perfusion is normal. There is no evidence of ischemia. There is no evidence of infarction.   Left ventricular function is normal. Nuclear stress EF: 64%.   Findings are consistent with no ischemia and no infarction. The study is low risk.  Cardiac monitor 05/2023: ZIO monitor reviewed.  11 days, 2 hours analyzed.   Predominant rhythm is sinus with heart rate ranging from 45 bpm up to 114 bpm and average heart rate 66 bpm. There were frequent PACs representing 6.8% total beats with otherwise occasional atrial couplets and triplets. There were rare PVCs including ventricular couplets representing less than 1% total beats. Multiple (2425) very brief episodes of PSVT were noted, the longest of which was only 17 beats.  There were no sustained arrhythmias.  Some of these episodes occurred with IVCD/aberrancy. There were no pauses or high degree heart block.  Echocardiogram 05/2023: 1. Left ventricular ejection fraction, by estimation, is 60 to 65%. The  left ventricle has normal function. The left ventricle has no regional  wall motion abnormalities. There is mild left ventricular hypertrophy.  Left ventricular diastolic parameters  are consistent with Grade I diastolic dysfunction (impaired relaxation).  Elevated left ventricular end-diastolic pressure. The average left  ventricular global longitudinal strain is -21.1 %. The global longitudinal   strain is normal.   2. Right ventricular systolic function is normal. The right ventricular  size is normal.   3. The mitral valve is normal in structure. Trivial mitral valve  regurgitation. No evidence of mitral stenosis.   4. The aortic valve is tricuspid. Aortic valve regurgitation is not  visualized. No aortic stenosis is present.   5. The inferior vena cava is normal in size with greater than 50%  respiratory variability, suggesting right atrial pressure of 3 mmHg.   6. Cannot exclude a small PFO.   Comparison(s): No significant change from prior study.    Echocardiogram 10/2021:  1. No significant pericardial effusion.   2. Left ventricular ejection fraction, by estimation, is 60 to 65%. The  left ventricle has normal function. The left ventricle has no regional  wall motion abnormalities. Left ventricular diastolic parameters are  indeterminate.   3. Right ventricular systolic function is normal. The right ventricular  size is normal.   4. Left atrial size was mildly dilated.   5. The mitral  valve is normal in structure. Mild mitral valve  regurgitation. No evidence of mitral stenosis.   6. The aortic valve is normal in structure. Aortic valve regurgitation is  not visualized. No aortic stenosis is present.   7. The inferior vena cava is normal in size with greater than 50%  respiratory variability, suggesting right atrial pressure of 3 mmHg.   CT cardiac 09/2021:  IMPRESSION: 1. There is normal pulmonary vein drainage into the left atrium with ostial measurements above.   2. There is no thrombus in the left atrial appendage.   3. The esophagus runs in the left atrial midline and is not in proximity to any of the pulmonary vein ostia.   4. No PFO/ASD.   5. Normal coronary origin. Right dominance.   6. CAC score of 1362 Agatston units which is 93rd percentile for age-, race-, and sex-matched controls.  IMPRESSION: 1. Signs signs of pulmonary emphysema with  potential interstitial lung disease. Correlate with respiratory symptoms and consider pulmonology follow-up with high-resolution chest CT as warranted. 2. Coronary artery disease and aortic atherosclerosis. 3. Post RIGHT mastectomy and breast reconstruction also with LEFT breast implant.  LHC 12/2019:  Mid RCA lesion is 40% stenosed. Prox RCA lesion is 20% stenosed. Dist Cx lesion is 80% stenosed. Prox Cx lesion is 50% stenosed. Mid LAD lesion is 20% stenosed.   1. Moderate stenosis mid Circumflex. This lesion is not flow limiting by functional assessment (DFR 0.96). The small caliber distal Circumflex has a severe stenosis but this vessel appears to be too small for PCI (1.75 mm vessel).  2. Mild plaque in the LAD 3. The RCA is a large dominant vessel with mild to moderate calcified plaque in the mid and distal vessel.    Recommendations: Medical management of CAD. The stenosis in the mid Circumflex is moderate angiographically but by functional flow wire assessment is not flow limiting (DFR 0.96). The small caliber distal Circumflex has a severe stenosis but this vessel appears to be too small for PCI (1.75 mm vessel).   Lexiscan  12/2019:  No diagnostic ST segment changes to indicate ischemia. Occasional PACs. Small, mild intensity, apical anteroseptal defect that is partially reversible in the setting of breast attenuation, mild ischemia not excluded. TID ratio increased at 1.31 so cannot exclude the possibility of more diffuse subendocardial ischemia. This is a high risk study due to increased TID ratio. Nuclear stress EF: 66%.  Physical Exam:   VS:  BP 124/68   Pulse 62   Ht 5' 5.5 (1.664 m)   Wt 238 lb (108 kg)   SpO2 98%   BMI 39.00 kg/m    Wt Readings from Last 3 Encounters:  12/31/23 238 lb (108 kg)  12/24/23 238 lb 5.1 oz (108.1 kg)  12/21/23 150 lb (68 kg)    GEN: Well nourished, well developed in no acute distress NECK: No JVD; No carotid bruits CARDIAC: S1/S2,  RRR, no murmurs, rubs, gallops RESPIRATORY:  Clear to auscultation without rales, wheezing or rhonchi  ABDOMEN: Soft, non-tender, non-distended EXTREMITIES:  No edema; No deformity, left knee bandage is clean, dry, and intact  ASSESSMENT AND PLAN: .     Chronic diastolic heart failure Noted during most recent hospital stay, possibly tachycardia mediated. Stage C, NYHA class I-II symptoms. EF normal. Euvolemic and well compensated on exam. Will stop losartan  and switch to Entresto  24/26 mg BID and obtain BMET, proBNP, and Mag. Will switch Lasix  to 40 mg daily PRN for weight gain, swelling, or  shortness of breath. No other medication changes at this time. Low sodium diet, fluid restriction <2L, and daily weights encouraged. Educated to contact our office for weight gain of 2 lbs overnight or 5 lbs in one week.  CAD Stable with no anginal symptoms. No indication for ischemic evaluation.  Continue current medication regimen.  Heart healthy diet encouraged.  Care and ED precautions discussed.  A-fib, s/p ablation in 11/2021, long term use of OAC Denies any tachycardia or palpitations. EKG today confirms she is NSR today.  Heart rate is well-controlled today. See recent monitor report noted above.  Continue current medication regimen.  Compliant and tolerating well and denies any bleeding issues.  She is on appropriate dosage.  Hypertension Blood pressure stable and at goal. Discussed goal SBP < 140. BP well-controlled at home. Discussed to monitor BP at home at least 2 hours after medications and sitting for 5-10 minutes. Heart healthy diet and regular cardiovascular exercise encouraged.  Stopping Losartan  and starting low dose Entresto  - see above.   I spent a total duration of 35 minutes reviewing prior notes, reviewing outside records including  labs, face-to-face counseling of medical condition, pathophysiology, evaluation, management, and documenting the findings in the note.   Dispo:  Follow-up with me/APP in 6-8 weeks or sooner if anything changes.  Signed, Almarie Crate, NP

## 2023-12-31 NOTE — Patient Instructions (Addendum)
 Medication Instructions:  Your physician has recommended you make the following change in your medication:  Please change Lasix  to as needed  Please stop Losartan   Please start Entresto  24-26 Mg Twice daily   Labwork: In 1 week at [facility]   Testing/Procedures: None   Follow-Up: Your physician recommends that you schedule a follow-up appointment in: 6-8 weeks   Any Other Special Instructions Will Be Listed Below (If Applicable).  If you need a refill on your cardiac medications before your next appointment, please call your pharmacy.

## 2024-01-09 LAB — BASIC METABOLIC PANEL WITH GFR
BUN/Creatinine Ratio: 15 (ref 12–28)
BUN: 18 mg/dL (ref 8–27)
CO2: 23 mmol/L (ref 20–29)
Calcium: 9.7 mg/dL (ref 8.7–10.3)
Chloride: 101 mmol/L (ref 96–106)
Creatinine, Ser: 1.21 mg/dL — ABNORMAL HIGH (ref 0.57–1.00)
Glucose: 136 mg/dL — ABNORMAL HIGH (ref 70–99)
Potassium: 4.9 mmol/L (ref 3.5–5.2)
Sodium: 140 mmol/L (ref 134–144)
eGFR: 44 mL/min/1.73 — ABNORMAL LOW (ref >59)

## 2024-01-09 LAB — BRAIN NATRIURETIC PEPTIDE: BNP: 447.6 pg/mL — AB (ref 0.0–100.0)

## 2024-01-09 LAB — MAGNESIUM: Magnesium: 1.8 mg/dL (ref 1.6–2.3)

## 2024-01-11 ENCOUNTER — Telehealth: Payer: Self-pay | Admitting: Nurse Practitioner

## 2024-01-11 NOTE — Telephone Encounter (Signed)
 Per Charmaine Heller, NP, patient currently being seen in the office now and BP is 188/92. Patient reports that she continued taking losartan  although it was stopped at last visit on 12/31/2023 and did start entresto  24/26 mg. Heller NP, requesting if we recommend restarting amlodipine  5 mg daily or increase entresto  to 49/51 mg BID. Will discuss with Miriam.

## 2024-01-11 NOTE — Telephone Encounter (Signed)
 [place], NP called in stating pt is there with elevated BP and she wants to change meds but would like to speak with Miriam, NP first, please advise. Call transferred to [facility], [place].

## 2024-01-11 NOTE — Telephone Encounter (Signed)
 Per Curlee do wonder if this white coat hypertension, because her last several BP's in our office have been good. Kidney function is up slightly. Recommend staying well hydrated and restart Amlodipine  2.5 mg daily and update our office in 1-2 weeks with her BP trends or can bring her in for a BP visit. Let's also recheck a BMET in 2 weeks to see if her kidney function is improving.  Charmaine Heller informed and said she will see patient in 2 weeks to follow up on lab work and blood pressure.

## 2024-01-12 ENCOUNTER — Ambulatory Visit: Admitting: Nurse Practitioner

## 2024-01-17 ENCOUNTER — Ambulatory Visit: Payer: Self-pay | Admitting: Nurse Practitioner

## 2024-01-17 DIAGNOSIS — R7989 Other specified abnormal findings of blood chemistry: Secondary | ICD-10-CM

## 2024-01-27 ENCOUNTER — Other Ambulatory Visit: Payer: Self-pay | Admitting: Nurse Practitioner

## 2024-01-28 ENCOUNTER — Encounter: Payer: Self-pay | Admitting: Gastroenterology

## 2024-02-01 ENCOUNTER — Telehealth: Payer: Self-pay | Admitting: Cardiology

## 2024-02-01 MED ORDER — AMIODARONE HCL 200 MG PO TABS: 200.0000 mg | ORAL_TABLET | Freq: Every day | ORAL | 0 refills | Status: DC

## 2024-02-01 NOTE — Telephone Encounter (Signed)
 RX sent in

## 2024-02-01 NOTE — Telephone Encounter (Signed)
*  STAT* If patient is at the pharmacy, call can be transferred to refill team.   1. Which medications need to be refilled? (please list name of each medication and dose if known) amiodarone  (PACERONE ) 200 MG tablet   2. Which pharmacy/location (including street and city if local pharmacy) is medication to be sent to? [facility] - [place], [place] - [address]   3. Do they need a 30 day or 90 day supply? 90

## 2024-02-09 ENCOUNTER — Ambulatory Visit: Attending: Nurse Practitioner | Admitting: Nurse Practitioner

## 2024-02-09 ENCOUNTER — Telehealth: Payer: Self-pay | Admitting: Nurse Practitioner

## 2024-02-09 ENCOUNTER — Encounter: Payer: Self-pay | Admitting: Nurse Practitioner

## 2024-02-09 VITALS — BP 128/62 | HR 53 | Ht 65.5 in | Wt 150.4 lb

## 2024-02-09 DIAGNOSIS — I4891 Unspecified atrial fibrillation: Secondary | ICD-10-CM | POA: Diagnosis not present

## 2024-02-09 DIAGNOSIS — I2510 Atherosclerotic heart disease of native coronary artery without angina pectoris: Secondary | ICD-10-CM

## 2024-02-09 DIAGNOSIS — I5032 Chronic diastolic (congestive) heart failure: Secondary | ICD-10-CM

## 2024-02-09 DIAGNOSIS — I10 Essential (primary) hypertension: Secondary | ICD-10-CM | POA: Diagnosis not present

## 2024-02-09 NOTE — Patient Instructions (Addendum)
 Medication Instructions:  Your physician recommends that you continue on your current medications as directed. Please refer to the Current Medication list given to you today. Please call our office or send us  a MyChart Message and let us  know the correct dosage of your Amlodipine  and Metoprolol    Labwork: None   Testing/Procedures: None   Follow-Up: Your physician recommends that you schedule a follow-up appointment in: 6 Months with Dr.McDowell   Any Other Special Instructions Will Be Listed Below (If Applicable).  If you need a refill on your cardiac medications before your next appointment, please call your pharmacy.

## 2024-02-09 NOTE — Telephone Encounter (Signed)
 Pt was to call back with her medication dosage on 2 medications and she said this is what she is taking:  metoprolol  succinate (TOPROL -XL) 50 MG 24 hr tablet  Take 1 tablet (50 mg total) by mouth in the morning and at bedtime. Take with or immediately following a meal.   Amlodipine  2.5 tablet once daily

## 2024-02-09 NOTE — Progress Notes (Signed)
 Cardiology Office Note:  .   Date: 02/09/2024 ID:  [name], DOB [birth year], MRN [id number] PCP: Melinda Pfeiffer, NP  [name] Providers Cardiologist:  Melinda Sierras, MD Electrophysiologist:  Melinda ONEIDA HOLTS, MD    History of Present Illness: .   [name] is a 83 y.o. female with a PMH of CAD, persistent A-fib, status post ablation in August 2023 (followed by EP), hypertension, asthma, COPD, GERD, hypothyroidism, rheumatoid arthritis/osteoarthritis, history of breast cancer, CKD, chronic pain syndrome, lumbar radiculopathy, and dependent leg edema, who presents today for follow-up.  Last seen by Dr. Sierras on December 30, 2022.  She was doing very well at that time.  05/14/2023 - Today she presents for follow-up.  She states she has not been doing well recently. Admits to recent DOE for the past 2 weeks, believes she is having issues with A-fib. She admits to recent swelling noted along her lower extremities bilaterally. BP is well-controlled at home even though BP is up today in the office.  She also admits to difficulty affording Xarelto , wants to know if she can have a more affordable medication option. Denies any chest pain, syncope, presyncope, dizziness, orthopnea, PND, significant weight changes, acute bleeding, or claudication.  06/23/2023 -presents today for follow-up.  Tells me she has not been doing well recently.  Her chief concerns are leg swelling that she noticed over the past weekend as well as nausea (denies any vomiting), and increased blood pressure.  She is unsure what is causing her high blood pressure as she has been reducing her salt intake.  Unsure what is causing her nausea as she denies any fever, chills, or any recent intolerance to certain meals. Denies any chest pain, shortness of breath, palpitations, syncope, presyncope, dizziness, orthopnea, PND, significant weight changes, acute bleeding, or claudication.  08/06/2023 -today she presents  for follow-up.  Doing some better in regards to leg swelling and denies any more nausea -she says her leg edema was noticed when legs were in dependent position, has had recent workup that showed lower extremity Doppler was negative for DVT.  Blood pressure has improved since last office visit, however she does admit to exertional chest tightness, noticed while going to get her mail.  She says when this happens, she experiences tightness where she has to sit down and rest, brief in duration, located along the center of her chest and denies any radiation.  She does admit to dyspnea on exertion with walking short distances.  Says she loves to exercise, but cannot do this like she wants to as she says she gives out easily and experiences shortness of breath, says has been going on for a while and seems to be a little worse recently.  She underwent left heart cath on Sep 15, 2023. See full report below.   11/19/2023 -  Here for follow-up. She admits to occasional right sided chest pain behind right breast tissue. Says it feels like indigestion, had a significant episode a while ago. Denies any specific triggers. Episodes last typically 2-3 hours over time, said last episode she had never had before. Left knee has been giving out, she is scheduled for an MRI next Tuesday. Denies any shortness of breath, palpitations, syncope, presyncope, dizziness, orthopnea, PND, swelling or significant weight changes, acute bleeding, or claudication.  Underwent left knee arthroplasty 12/21/2023. Hospitalized shortly after knee surgery d/t chest pain, shortness of breath with exertion noted over the last couple of weeks.  Chest pain was similar to  previous symptomatic A-fib.  Son noticed that she was not acting right and called EMS.  Was found to be in A-fib with RVR.  Initially presented to [facility] and was transferred to [facility] due to having troponin elevation at [phone].  proBNP 8364.  CT of chest was negative for PE.   She was planned for cardiac catheterization initially but given that her echocardiogram was reassuring it was felt to be type II NSTEMI, cardiac catheterization was canceled and she was treated medically.  Hospital course was noted by spontaneous conversion to normal sinus rhythm once given IV diltiazem  drip, had a recurrence of A-fib with RVR and was started on amiodarone  drip and transition to p.o. amiodarone .  Altered mental status resolved.  Echo revealed normal LVEF, moderate LVH, no RWMA, grade 2 DD, low normal RV systolic function, trivial AI, mild LAE, mild MR.  Was felt to be tachycardia mediated versus diastolic heart failure.  Was given short treatment of p.o. Lasix  for 3 days.  12/31/2023 - Today she presents for follow-up.  Tolerating her medications well.  She does admit to back pain that has been going on for few weeks, believes this is related to how she is sitting/possible arthritis. Doing well from a cardiac perspective. Denies any chest pain, shortness of breath, palpitations, syncope, presyncope, dizziness, orthopnea, PND, swelling or significant weight changes, acute bleeding, or claudication.  02/09/2024 - She is here for follow-up. Doing well. Denies any acute cardiac complaints or issues. She is unsure what dosage of Metoprolol  and Amlodipine  she is on, otherwise doing well and tolerating her medications well. Denies any chest pain, shortness of breath, palpitations, syncope, presyncope, dizziness, orthopnea, PND, swelling or significant weight changes, acute bleeding, or claudication.   ROS: Negative.  See HPI.  Studies Reviewed: SABRA    EKG: EKG is not ordered today.    Echo 11/2023: 1. Left ventricular ejection fraction, by estimation, is 60 to 65%. Left  ventricular ejection fraction by PLAX is 63 %. The left ventricle has  normal function. The left ventricle has no regional wall motion  abnormalities. There is moderate left  ventricular hypertrophy. Left ventricular  diastolic parameters are  consistent with Grade II diastolic dysfunction (pseudonormalization).  Elevated left ventricular end-diastolic pressure. The E/e' is 17.   2. Right ventricular systolic function is low normal. The right  ventricular size is normal. Tricuspid regurgitation signal is inadequate  for assessing PA pressure.   3. Left atrial size was mildly dilated.   4. The mitral valve is degenerative. Mild mitral valve regurgitation.   5. The aortic valve is tricuspid. Aortic valve regurgitation is trivial.  Aortic valve sclerosis/calcification is present, without any evidence of  aortic stenosis.   6. The inferior vena cava is normal in size with <50% respiratory  variability, suggesting right atrial pressure of 8 mmHg.   Comparison(s): Changes from prior study are noted. 10/31/2021: LVEF 60-65%.   LHC 08/2023:    Mid RCA lesion is 60% stenosed. -Mild progression of disease; Prox RCA lesion is 20% stenosed.   Mid LAD lesion is 20% stenosed.   Prox Cx lesion is 50% stenosed. LPAV lesion is 60% stenosed (stable upon improved flow)   LV end diastolic pressure is normal.   There is no aortic valve stenosis.   Dominance: Right  POST-OPERATIVE DIAGNOSIS:   Nonobstructive coronary disease-relatively stable compared to 2021 with exception of progression of disease in the mid to distal RCA from 20% to 50%, stable proximal LCx 50%  stenosis with ostium of AV groove circumflex 60% (previously read as 80%). Normal LVEDP     RECOMMENDATIONS   In the absence of any other complications or medical issues, we expect the patient to be ready for discharge from a cath perspective on 09/15/2023.   Recommend Aspirin  81mg  daily for moderate CAD.  Lexiscan  07/2023:      Stress ECG is negative for ischemia and arrhythmias   LV perfusion is normal. There is no evidence of ischemia. There is no evidence of infarction.   Left ventricular function is normal. Nuclear stress EF: 64%.   Findings are  consistent with no ischemia and no infarction. The study is low risk.  Cardiac monitor 05/2023: ZIO monitor reviewed.  11 days, 2 hours analyzed.   Predominant rhythm is sinus with heart rate ranging from 45 bpm up to 114 bpm and average heart rate 66 bpm. There were frequent PACs representing 6.8% total beats with otherwise occasional atrial couplets and triplets. There were rare PVCs including ventricular couplets representing less than 1% total beats. Multiple (2425) very brief episodes of PSVT were noted, the longest of which was only 17 beats.  There were no sustained arrhythmias.  Some of these episodes occurred with IVCD/aberrancy. There were no pauses or high degree heart block.  Echocardiogram 05/2023: 1. Left ventricular ejection fraction, by estimation, is 60 to 65%. The  left ventricle has normal function. The left ventricle has no regional  wall motion abnormalities. There is mild left ventricular hypertrophy.  Left ventricular diastolic parameters  are consistent with Grade I diastolic dysfunction (impaired relaxation).  Elevated left ventricular end-diastolic pressure. The average left  ventricular global longitudinal strain is -21.1 %. The global longitudinal  strain is normal.   2. Right ventricular systolic function is normal. The right ventricular  size is normal.   3. The mitral valve is normal in structure. Trivial mitral valve  regurgitation. No evidence of mitral stenosis.   4. The aortic valve is tricuspid. Aortic valve regurgitation is not  visualized. No aortic stenosis is present.   5. The inferior vena cava is normal in size with greater than 50%  respiratory variability, suggesting right atrial pressure of 3 mmHg.   6. Cannot exclude a small PFO.   Comparison(s): No significant change from prior study.    Echocardiogram 10/2021:  1. No significant pericardial effusion.   2. Left ventricular ejection fraction, by estimation, is 60 to 65%. The  left  ventricle has normal function. The left ventricle has no regional  wall motion abnormalities. Left ventricular diastolic parameters are  indeterminate.   3. Right ventricular systolic function is normal. The right ventricular  size is normal.   4. Left atrial size was mildly dilated.   5. The mitral valve is normal in structure. Mild mitral valve  regurgitation. No evidence of mitral stenosis.   6. The aortic valve is normal in structure. Aortic valve regurgitation is  not visualized. No aortic stenosis is present.   7. The inferior vena cava is normal in size with greater than 50%  respiratory variability, suggesting right atrial pressure of 3 mmHg.   CT cardiac 09/2021:  IMPRESSION: 1. There is normal pulmonary vein drainage into the left atrium with ostial measurements above.   2. There is no thrombus in the left atrial appendage.   3. The esophagus runs in the left atrial midline and is not in proximity to any of the pulmonary vein ostia.   4. No PFO/ASD.  5. Normal coronary origin. Right dominance.   6. CAC score of 1362 Agatston units which is 93rd percentile for age-, race-, and sex-matched controls.  IMPRESSION: 1. Signs signs of pulmonary emphysema with potential interstitial lung disease. Correlate with respiratory symptoms and consider pulmonology follow-up with high-resolution chest CT as warranted. 2. Coronary artery disease and aortic atherosclerosis. 3. Post RIGHT mastectomy and breast reconstruction also with LEFT breast implant.  LHC 12/2019:  Mid RCA lesion is 40% stenosed. Prox RCA lesion is 20% stenosed. Dist Cx lesion is 80% stenosed. Prox Cx lesion is 50% stenosed. Mid LAD lesion is 20% stenosed.   1. Moderate stenosis mid Circumflex. This lesion is not flow limiting by functional assessment (DFR 0.96). The small caliber distal Circumflex has a severe stenosis but this vessel appears to be too small for PCI (1.75 mm vessel).  2. Mild plaque in the  LAD 3. The RCA is a large dominant vessel with mild to moderate calcified plaque in the mid and distal vessel.    Recommendations: Medical management of CAD. The stenosis in the mid Circumflex is moderate angiographically but by functional flow wire assessment is not flow limiting (DFR 0.96). The small caliber distal Circumflex has a severe stenosis but this vessel appears to be too small for PCI (1.75 mm vessel).   Lexiscan  12/2019:  No diagnostic ST segment changes to indicate ischemia. Occasional PACs. Small, mild intensity, apical anteroseptal defect that is partially reversible in the setting of breast attenuation, mild ischemia not excluded. TID ratio increased at 1.31 so cannot exclude the possibility of more diffuse subendocardial ischemia. This is a high risk study due to increased TID ratio. Nuclear stress EF: 66%.  Physical Exam:   VS:  BP 128/62 (BP Location: Left Arm)   Pulse (!) 53   Ht 5' 5.5 (1.664 m)   Wt 150 lb 6.4 oz (68.2 kg)   SpO2 96%   BMI 24.65 kg/m    Wt Readings from Last 3 Encounters:  02/09/24 150 lb 6.4 oz (68.2 kg)  12/31/23 238 lb (108 kg)  12/24/23 238 lb 5.1 oz (108.1 kg)    GEN: Well nourished, well developed in no acute distress NECK: No JVD; No carotid bruits CARDIAC: S1/S2, RRR, no murmurs, rubs, gallops RESPIRATORY:  Clear to auscultation without rales, wheezing or rhonchi  ABDOMEN: Soft, non-tender, non-distended EXTREMITIES:  No edema; No deformity, left knee bandage is clean, dry, and intact  ASSESSMENT AND PLAN: .     Chronic diastolic heart failure Stage C, NYHA class I-II symptoms. EF normal. Euvolemic and well compensated on exam.  No medication changes at this time. Low sodium diet, fluid restriction <2L, and daily weights encouraged. Educated to contact our office for weight gain of 2 lbs overnight or 5 lbs in one week. She will call our office and confirm dosages of two of her medications - see HPI.   CAD Stable with no anginal  symptoms. No indication for ischemic evaluation.  Continue current medication regimen.  Heart healthy diet encouraged.  Care and ED precautions discussed.  A-fib, s/p ablation in 11/2021, long term use of OAC Denies any tachycardia or palpitations.  Heart rate is well-controlled today. See recent monitor report noted above.  Continue current medication regimen.  Compliant and tolerating medications well and denies any bleeding issues.  She is on appropriate dosage of Xarelto .  Hypertension Blood pressure stable and at goal. Discussed goal SBP < 140. BP well-controlled at home. Discussed to monitor BP at home  at least 2 hours after medications and sitting for 5-10 minutes. Continue current medication regimen. Heart healthy diet and regular cardiovascular exercise encouraged.     I spent a total duration of 30 minutes reviewing prior notes, reviewing outside records including  labs, face-to-face counseling of medical condition, pathophysiology, evaluation, management, and documenting the findings in the note.   Dispo: Follow-up with MD/APP in 6 months or sooner if anything changes.  Signed, Almarie Crate, NP

## 2024-02-09 NOTE — Telephone Encounter (Signed)
 Med list updated and routed to provider

## 2024-02-11 ENCOUNTER — Ambulatory Visit: Admitting: Nurse Practitioner

## 2024-03-01 ENCOUNTER — Other Ambulatory Visit: Payer: Self-pay | Admitting: Cardiology

## 2024-03-03 ENCOUNTER — Encounter: Payer: Self-pay | Admitting: Gastroenterology

## 2024-03-03 ENCOUNTER — Ambulatory Visit: Admitting: Gastroenterology

## 2024-03-03 VITALS — BP 138/68 | HR 66 | Temp 97.6°F | Ht 65.5 in | Wt 150.6 lb

## 2024-03-03 DIAGNOSIS — R198 Other specified symptoms and signs involving the digestive system and abdomen: Secondary | ICD-10-CM

## 2024-03-03 DIAGNOSIS — R1310 Dysphagia, unspecified: Secondary | ICD-10-CM

## 2024-03-03 DIAGNOSIS — K581 Irritable bowel syndrome with constipation: Secondary | ICD-10-CM

## 2024-03-03 DIAGNOSIS — T402X5A Adverse effect of other opioids, initial encounter: Secondary | ICD-10-CM

## 2024-03-03 DIAGNOSIS — K5903 Drug induced constipation: Secondary | ICD-10-CM | POA: Diagnosis not present

## 2024-03-03 DIAGNOSIS — R150 Incomplete defecation: Secondary | ICD-10-CM

## 2024-03-03 NOTE — Progress Notes (Signed)
 GI Office Note    Referring Provider: Suanne Pfeiffer, NP Primary Care Physician:  Suanne Pfeiffer, NP  Primary Gastroenterologist: Lamar HERO.Rourk, MD  Chief Complaint   Chief Complaint  Patient presents with   Constipation    Constipation stool is really hard to come out. Thinks she has hemorrhoids, rectum hurts when having a bowel movement. Stomach pains sometimes feels like she might pass out.    History of Present Illness   [name] is a 83 y.o. female presenting today at the request of Suanne Pfeiffer, NP for constipation and rectal pain with bowel movements.  Colonoscopy April 2016: - Minimal anal canal hemorrhoids - Colonic diverticulosis - Exam otherwise normal -Somewhat redundant colon - Advise proceed with CT of the abdomen pelvis to further evaluate symptoms - Advise no future colonoscopy needed unless she develops alarm symptoms.  EGD April 2016: - Normal esophagus s/p Maloney dilation and subsequent biopsy - Stomach full of bile-stained mucus - Gastric polyps s/p biopsy - Pathology with benign squamous mucosa, no EOE, benign fundic gland polyp negative for dysplasia  Last seen in the office in April 2021 by Camellia Kays, NP.  At this time she reported she was doing okay overall, has been recently placed in memory care unit.  She is adjusting well.  She was wanting to get her EGD scheduled.  Having solid food dysphagia 1-2 times a week, avoiding eating by herself due to fear of choking.  Having occasional regurgitation episodes.  Bowel movements still consistent with [place] 1 and only taking Benefiber as needed as she reports her stools become runny with it.  Having right lower quadrant abdominal pain that is sharp and brief intermittent in nature.  She is on hydrocodone  twice daily.  She noted some scant toilet tissue hematochezia for 1 week noting when straining.  Denied any other upper or lower GI symptoms.  Peripheral send in September 2025 she was only  taking Colace for constipation, advised her to increase it to twice a day.  She was given referral to GI.  Today:  Discussed the use of AI scribe software for clinical note transcription with the patient, who gave verbal consent to proceed.  She experiences abdominal pain and constipation, with bowel movements occurring every two to three days. She takes four stool softeners daily, which provide some relief, but she still struggles with incomplete evacuation and hard stools. There is a sensation of rectal pressure and occasional dizziness when attempting to have a bowel movement, even when stools are runny.  She has been on hydrocodone  for back pain BID, which may be contributing to her constipation. She has not tried any prescription medications specifically for constipation in the past.   No significant upper gastrointestinal symptoms such as nausea, vomiting, or trouble swallowing, although she occasionally experiences choking, particularly with dry, grainy foods like hush puppies or cornbread. This issue was previously addressed with a planned procedure to stretch her throat, which was canceled due to her husband's illness.  No significant bleeding, but she mentions frequent wiping during bowel movements.      Wt Readings from Last 6 Encounters:  03/03/24 150 lb 9.6 oz (68.3 kg)  02/09/24 150 lb 6.4 oz (68.2 kg)  12/31/23 238 lb (108 kg)  12/24/23 238 lb 5.1 oz (108.1 kg)  12/21/23 150 lb (68 kg)  12/15/23 150 lb (68 kg)    Body mass index is 24.68 kg/m.  Current Outpatient Medications  Medication Sig Dispense Refill   albuterol  (  VENTOLIN  HFA) 108 (90 Base) MCG/ACT inhaler Inhale 1-2 puffs into the lungs every 6 (six) hours as needed for shortness of breath or wheezing.     amiodarone  (PACERONE ) 200 MG tablet TAKE ONE TABLET BY MOUTH EVERY DAY 15 tablet 0   amLODipine  (NORVASC ) 2.5 MG tablet Take 2.5 mg by mouth daily.     docusate sodium  (COLACE) 100 MG capsule Take 100 mg by  mouth daily after supper.     furosemide  (LASIX ) 40 MG tablet Take 1 tablet (40 mg total) by mouth daily as needed for fluid. 90 tablet 0   gabapentin  (NEURONTIN ) 300 MG capsule Take 300 mg by mouth daily after supper.     levothyroxine  (SYNTHROID ) 25 MCG tablet Take 25 mcg by mouth daily before breakfast.     losartan  (COZAAR ) 50 MG tablet Take 50 mg by mouth daily.     methocarbamol  (ROBAXIN ) 500 MG tablet Take 500 mg by mouth every 6 (six) hours as needed for muscle spasms.     metoprolol  succinate (TOPROL -XL) 50 MG 24 hr tablet Take 1 tablet (50 mg total) by mouth in the morning and at bedtime. Take with or immediately following a meal. 180 tablet 1   nitroGLYCERIN  (NITROSTAT ) 0.4 MG SL tablet Place 1 tablet (0.4 mg total) under the tongue every 5 (five) minutes as needed for chest pain. 25 tablet 3   omeprazole  (PRILOSEC OTC) 20 MG tablet Take 20 mg by mouth daily as needed (heartburn).     potassium chloride  SA (KLOR-CON  M) 20 MEQ tablet Take 2 tablets (40 mEq total) by mouth daily. 3 tablet 0   Rivaroxaban  (XARELTO ) 15 MG TABS tablet Take 1 tablet (15 mg total) by mouth daily with supper. 90 tablet 1   rosuvastatin  (CRESTOR ) 5 MG tablet Take 1 tablet (5 mg total) by mouth at bedtime. 90 tablet 3   sacubitril -valsartan  (ENTRESTO ) 24-26 MG Take 1 tablet by mouth 2 (two) times daily. 60 tablet 5   budesonide-formoterol (SYMBICORT) 160-4.5 MCG/ACT inhaler Inhale 2 puffs into the lungs daily as needed (respiratory issues.). (Patient not taking: Reported on 03/03/2024)     fluticasone  furoate-vilanterol (BREO ELLIPTA ) 200-25 MCG/ACT AEPB Inhale 1 puff into the lungs daily. (Patient not taking: Reported on 03/03/2024)     HYDROcodone -acetaminophen  (NORCO) 7.5-325 MG tablet Take 1 tablet by mouth every 6 (six) hours as needed for moderate pain (pain score 4-6).     No current facility-administered medications for this visit.    Past Medical History:  Diagnosis Date   Anemia    Arthritis     Breast cancer (HCC)    Right mastectomy   Chronic back pain    Coronary atherosclerosis of native coronary artery    a. 12/2019 Cath: LM nl, LAD 52m, LCX 50p, 80d, RCA 20p, 81m-->med rx.   Demand ischemia (HCC)    a. 02/2021 HsTrop to 361 in setting of rapid afib; b. 02/2021 Echo: EF 65-70%, no rwma, mild LVH, RVSP 28.11mmHg. Mildly to mod dil LA. Triv MR. Ao sclerosis w/o stenosis.   Dysrhythmia    A. Fib   Environmental allergies    Essential hypertension    GERD (gastroesophageal reflux disease)    History of bronchitis    History of colon polyps    History of diverticulosis    History of migraine    Panic attacks    Persistent atrial fibrillation (HCC) 02/2021   a. CHA2DS2VASc = 5-->xarelto .   Prolapse of vaginal vault after hysterectomy  Seasonal allergies    Urinary frequency     Past Surgical History:  Procedure Laterality Date   ABDOMINAL HYSTERECTOMY     ANTERIOR CERVICAL DECOMP/DISCECTOMY FUSION N/A 10/28/2023   Procedure: ANTERIOR CERVICAL DECOMPRESSION/DISCECTOMY FUSION 2 LEVELS;  Surgeon: Beuford Anes, MD;  Location: [facility];  Service: Orthopedics;  Laterality: N/A;  ANTERIOR CERVICAL DECOMPRESSION AND FUSION CERVICAL 6- CERVICAL 7, CERVICAL 7- THORACIC 1 WITH INSTRUMENTATION AND ALLOGRAFT   ATRIAL FIBRILLATION ABLATION N/A 10/28/2021   Procedure: ATRIAL FIBRILLATION ABLATION;  Surgeon: Cindie Ole DASEN, MD;  Location: [facility];  Service: Cardiovascular;  Laterality: N/A;   BACK SURGERY  1981/2011/2014   BLADDER SUSPENSION     BREAST RECONSTRUCTION  2004   with abd tissue   BREAST SURGERY Right    CARDIOVERSION N/A 04/30/2021   Procedure: CARDIOVERSION;  Surgeon: Alvan Dorn FALCON, MD;  Location: [facility];  Service: Endoscopy;  Laterality: N/A;   CARDIOVERSION N/A 05/16/2021   Procedure: CARDIOVERSION;  Surgeon: Alvan Dorn FALCON, MD;  Location: [facility];  Service: Endoscopy;  Laterality: N/A;   CARDIOVERSION N/A 08/13/2021   Procedure: CARDIOVERSION;   Surgeon: Kate Lonni CROME, MD;  Location: [facility] ENDOSCOPY;  Service: Cardiovascular;  Laterality: N/A;   CHOLECYSTECTOMY     COLONOSCOPY  12/2009   RMR: few pancolonic diverticula. next TCS 12/2014   COLONOSCOPY  06/14/2007   MFM:[id number] rectal polyp, status post cold biopsy removal/ Anal canal hemorrhoids/Left-sided diverticula Colonic mucosa appeared normal.Hyperplastic polyp.    COLONOSCOPY  01/18/2004   MFM:Dphfnpi diverticula/Internal hemorrhoids.  Otherwise normal rectum   COLONOSCOPY N/A 04/18/2013   Dr. Shaaron- normal rectum, scattered left sided diverticula the remainder of the colonic mucosa appeared normal   COLONOSCOPY N/A 08/18/2014   Procedure: COLONOSCOPY;  Surgeon: Lamar CHRISTELLA Shaaron, MD;  Location: [facility];  Service: Endoscopy;  Laterality: N/A;  1000   ESOPHAGOGASTRODUODENOSCOPY  06/14/2007   MFM:[id number]-opxz plaques in esophageal mucosa of uncertain significance brushed and biopsied/Normal stomach, normal first duodenum and second duodenoscopy. KOH negative. esophageal bx c/w GERD   ESOPHAGOGASTRODUODENOSCOPY N/A 08/18/2014   Procedure: ESOPHAGOGASTRODUODENOSCOPY (EGD);  Surgeon: Lamar CHRISTELLA Shaaron, MD;  Location: [facility];  Service: Endoscopy;  Laterality: N/A;   ESOPHAGOGASTRODUODENOSCOPY (EGD) WITH ESOPHAGEAL DILATION N/A 04/18/2013   Dr. Shaaron- normal, patent appearing, tubular esophagus, normal gastric mucosa, paptent pylorus, normal first and second portion of the duodenum   EYE SURGERY     lasik   FOOT SURGERY Right 2014   d/t plantar fascitis   GIVENS CAPSULE STUDY N/A 07/18/2013   L.Lewis PAC- markedly abnormal appearing gastric mucosa, sugnificant bile reflux, questionable subucosal small bowel mass versus extrinsic compression in the distal small bowel.- CTE= no small bowel mass or tumor seen.   LEFT HEART CATH AND CORONARY ANGIOGRAPHY N/A 01/16/2020   Procedure: LEFT HEART CATH AND CORONARY ANGIOGRAPHY;  Surgeon: Verlin Lonni BIRCH, MD;  Location:  [facility];  Service: Cardiovascular;  Laterality: N/A;   LEFT HEART CATH AND CORONARY ANGIOGRAPHY N/A 09/15/2023   Procedure: LEFT HEART CATH AND CORONARY ANGIOGRAPHY;  Surgeon: Anner Alm ORN, MD;  Location: [facility] INVASIVE CV LAB;  Service: Cardiovascular;  Laterality: N/A;   MALONEY DILATION N/A 08/18/2014   Procedure: AGAPITO DILATION;  Surgeon: Lamar CHRISTELLA Shaaron, MD;  Location: [facility];  Service: Endoscopy;  Laterality: N/A;   MASTECTOMY     right side 12-13 years ago   MOUTH SURGERY     cyst removed in June 2022.   TOTAL HIP  ARTHROPLASTY Left 02/29/2016   Procedure: TOTAL HIP ARTHROPLASTY ANTERIOR APPROACH;  Surgeon: Dempsey Sensor, MD;  Location: [facility] OR;  Service: Orthopedics;  Laterality: Left;   TOTAL HIP ARTHROPLASTY Right 01/05/2017   Procedure: TOTAL HIP ARTHROPLASTY ANTERIOR APPROACH;  Surgeon: Sensor Dempsey, MD;  Location: [facility] OR;  Service: Orthopedics;  Laterality: Right;  REQUEST 90 MINS   TOTAL KNEE ARTHROPLASTY Left 12/21/2023   Procedure: ARTHROPLASTY, KNEE, TOTAL;  Surgeon: Yvone Rush, MD;  Location: [facility] ORS;  Service: Orthopedics;  Laterality: Left;    Family History  Problem Relation Age of Onset   Coronary artery disease Mother    Coronary artery disease Father    Cancer Sister        lung cancer, two sisters   Lung cancer Sister    Coronary artery disease Brother    Colon cancer Brother        less than age 56   Cancer Brother    Emphysema Brother    Diabetes Son    Healthy Son    Healthy Son    Healthy Son    Healthy Son     Allergies as of 03/03/2024 - Review Complete 03/03/2024  Allergen Reaction Noted   Penicillins Anaphylaxis, Swelling, and Rash    Eliquis  [apixaban ] Hives 05/06/2016    Social History   Socioeconomic History   Marital status: Widowed    Spouse name: [name]   Number of children: 4   Years of education: 12th   Highest education level: Not on file  Occupational History    Employer: RETIRED  Tobacco Use   Smoking  status: Former    Current packs/day: 0.00    Average packs/day: 1.5 packs/day for 30.0 years (45.0 ttl pk-yrs)    Types: Cigarettes    Start date: 04/28/1970    Quit date: 04/28/2000    Years since quitting: 23.8   Smokeless tobacco: Never   Tobacco comments:    Former smoker 07/30/21  Vaping Use   Vaping status: Never Used  Substance and Sexual Activity   Alcohol use: No   Drug use: No   Sexual activity: Not on file    Comment: hyst  Other Topics Concern   Not on file  Social History Narrative   Patient lives at home with spouse.   Caffeine Use: 2 cups of coffee daily   Social Drivers of Health   Financial Resource Strain: Low Risk  (12/03/2023)   Received from [facility]   Overall Financial Resource Strain (CARDIA)    How hard is it for you to pay for the very basics like food, housing, medical care, and heating?: Not hard at all  Food Insecurity: No Food Insecurity (12/24/2023)   Hunger Vital Sign    Worried About Running Out of Food in the Last Year: Never true    Ran Out of Food in the Last Year: Never true  Transportation Needs: No Transportation Needs (12/24/2023)   PRAPARE - Administrator, Civil Service (Medical): No    Lack of Transportation (Non-Medical): No  Physical Activity: Insufficiently Active (12/03/2023)   Received from [facility]   Exercise Vital Sign    On average, how many days per week do you engage in moderate to strenuous exercise (like a brisk walk)?: 3 days    On average, how many minutes do you engage in exercise at this level?: 20 min  Stress: No Stress Concern Present (12/03/2023)   Received from [facility]  of Occupational Health - Occupational Stress Questionnaire    Do you feel stress - tense, restless, nervous, or anxious, or unable to sleep at night because your mind is troubled all the time - these days?: Not at all  Social Connections: Socially Integrated (12/24/2023)   Social Connection and Isolation Panel     Frequency of Communication with Friends and Family: Once a week    Frequency of Social Gatherings with Friends and Family: Twice a week    Attends Religious Services: 1 to 4 times per year    Active Member of [employer] or Organizations: No    Attends Engineer, Structural: 1 to 4 times per year    Marital Status: Married  Catering Manager Violence: Not At Risk (12/24/2023)   Humiliation, Afraid, Rape, and Kick questionnaire    Fear of Current or Ex-Partner: No    Emotionally Abused: No    Physically Abused: No    Sexually Abused: No    Review of Systems   Gen: Denies any fever, chills, fatigue, weight loss, lack of appetite.  CV: Denies chest pain, heart palpitations, peripheral edema, syncope.  Resp: Denies shortness of breath at rest or with exertion. Denies wheezing or cough.  GI: see HPI GU : Denies urinary burning, urinary frequency, urinary hesitancy MS: Denies joint pain, muscle weakness, cramps, or limitation of movement.  Derm: Denies rash, itching, dry skin Psych: Denies depression, anxiety, memory loss, and confusion Heme: Denies bruising, bleeding, and enlarged lymph nodes.  Physical Exam   BP 138/68 (BP Location: Left Arm, Patient Position: Sitting, Cuff Size: Large)   Pulse 66   Temp 97.6 F (36.4 C) (Temporal)   Ht 5' 5.5 (1.664 m)   Wt 150 lb 9.6 oz (68.3 kg)   BMI 24.68 kg/m   General:   Alert and oriented. Pleasant and cooperative. Well-nourished and well-developed.  Head:  Normocephalic and atraumatic. Eyes:  Without icterus, sclera clear and conjunctiva pink.  Ears:  Normal auditory acuity. Mouth:  No deformity or lesions, oral mucosa pink.  Abdomen:  +BS, soft, non-distended. Ttp to umbilical region, LUQ, RLQ. No HSM noted. No guarding or rebound. No masses appreciated.   Rectal: Chaperone: Madelin Ponto, LPN External visual inspection without any evidence of external hemorrhoids or anal fissure.  Internal examination with possible small  palpable hemorrhoidal tissue to the right posterior column but overall no hard mass identified to the level of the my index finger is able to reach.  Did have soft formed stool within the rectal vault which was broken up by me today.  This retained stool was more in the posterior side of the rectum where she is experiencing pressure.  Neurologic:  Alert and  oriented x4;  grossly normal neurologically. Skin:  Intact without significant lesions or rashes. Psych:  Alert and cooperative. Normal mood and affect.  Assessment & Plan   BERRY GALLACHER is a 83 y.o. female with a history of GERD, dysphagia, chronic back pain, breast cancer s/p right mastectomy, bronchitis, A-fib, and HTN presenting today with complaints of constipation for pain.    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 Rectal exam reveals stool impaction without masses or hemorrhoids and no overt bleeding. Last colonoscopy in 2016 with internal hemorrhoids. Differential includes IBS with constipation and opioid-induced constipation. No immediate need for colonoscopy unless symptoms worsen  or bleeding occurs. Does have lower abdominal pain and tenderness on exam today along with rectal pressure which is likely secondary to retained stool.  No melena, brbpr, weight loss or other alarm symptoms at this time to warrant colonoscopy but will consider if these develop or rectal pressure/pain persists.  - Initiate Linzess  145 mcg daily on an empty stomach, instructed to wait 30 minutes before eating. - Discontinued Colace during Linzess  trial. - Provided two weeks of Linzess  samples. - Instructed to call after two weeks to report efficacy. - Will consider increasing Linzess  dose or switching to Movantik if ineffective. - Will  discuss alternative medications like Amitiza  if Linzess  is not covered or too expensive. - Consider colonoscopy if alarm symptoms develop or rectal pressure/pain persists.   Dizziness associated with defecation (vasovagal response) Dizziness during defecation likely due to vasovagal response from rectal pressure and mild straining. No external hemorrhoids noted on exam, possible small internal hemorrhoid. - Address underlying constipation to reduce rectal pressure and associated dizziness.  Intermittent dysphagia Occasional choking, particularly with dry, grainy foods. No recent upper endoscopy due to previous cancellation. Symptoms not frequent enough to warrant immediate endoscopy at this time. Can consider if frequency increases.  - Advised moistening foods and drinking water  while eating to prevent choking. - Recommended sitting upright while eating to aid swallowing. - Will reassess need for upper endoscopy if symptoms persist or worsen.      Follow up   Follow up 6 to 8 weeks.   Charmaine Melia, MSN, FNP-BC, AGACNP-BC [facility]

## 2024-03-03 NOTE — Patient Instructions (Addendum)
 Start Linzess  145 mcg.   How to take Linzess : Once a day every day on empty stomach, at least 30 minutes before your first meal of the day. It is best to keep medications at a stable temperature Medication is best kept in its original bottle with the disket present.  It is a medication that is meant for everyday use and not to be used as needed.   What to expect: Constipation relief is typically felt in about 1 week Relief of abdominal pain, discomfort, and bloating begins in about 1 week with symptoms typically improving over 12 weeks and beyond. Diarrhea is most common side effect and typically begins within the first 2 weeks and can take 3-4 weeks to resolve It would be helpful to begin treatment over the weekend or when you can be closer to a bathroom   You can go to [email or web address] for patient support and sign up for daily medication reminders.   I do not yet see reason for colonoscopy but if you are having worsening rectal pain, you develop rectal bleeding, or if you develop changes in your appetite and weight loss then we may consider it or if were not able to get this constipation under control.  While you are taking the Linzess  I want you to avoid taking any of the Colace.  If you find that you have not had a bowel movement on the Linzess  145 mcg within a week then resume your Colace and call the office and let me know.  If you are having good regular bowel movements or having diarrhea please give it through the trial of the Linzess  to see if it improves.  If you are not having any bowel movements within a week let me know and I will increase the Linzess  to 290 mcg.  Follow-up in 6-8 weeks.   It was a pleasure to see you today. I want to create trusting relationships with patients. If you receive a survey regarding your visit,  I greatly appreciate you taking time to fill this out on paper or through your MyChart. I value your feedback.  Charmaine Melia, MSN, FNP-BC,  AGACNP-BC [facility]

## 2024-05-27 ENCOUNTER — Telehealth: Payer: Self-pay | Admitting: Cardiology

## 2024-05-27 NOTE — Telephone Encounter (Signed)
 Contacted [facility] to confirm copay for xarelto  15 mg and entresto  24/26 mg.  Per pharmacy staff the cost of Xarelto  is $ 207 for 30 day supply and the cost of brand name entresto  is $256.79. Advised to check the entresto  cost for generic which is $58.39 for a 30 day supply.  Left message for patient to call office.

## 2024-05-27 NOTE — Telephone Encounter (Signed)
 Pt c/o medication issue:  1. Name of Medication: Rivaroxaban  (XARELTO ) 15 MG TABS tablet    sacubitril -valsartan  (ENTRESTO ) 24-26 MG    2. How are you currently taking this medication (dosage and times per day)?    3. Are you having a reaction (difficulty breathing--STAT)? no  4. What is your medication issue? Patient states that she can't afford the medication. Please advise

## 2024-05-30 NOTE — Telephone Encounter (Signed)
 Patient is returning call. Please advise?
